# Patient Record
Sex: Male | Born: 1955 | Race: White | Hispanic: No | Smoking: Current every day smoker
Health system: Southern US, Community
[De-identification: ages and names within clinical notes are randomized; demographics above are authoritative.]

## PROBLEM LIST (undated history)

## (undated) DIAGNOSIS — I10 Essential (primary) hypertension: Secondary | ICD-10-CM

## (undated) DIAGNOSIS — F32A Depression, unspecified: Secondary | ICD-10-CM

## (undated) DIAGNOSIS — T18128A Food in esophagus causing other injury, initial encounter: Secondary | ICD-10-CM

## (undated) DIAGNOSIS — I2510 Atherosclerotic heart disease of native coronary artery without angina pectoris: Secondary | ICD-10-CM

## (undated) DIAGNOSIS — C801 Malignant (primary) neoplasm, unspecified: Secondary | ICD-10-CM

## (undated) DIAGNOSIS — I639 Cerebral infarction, unspecified: Secondary | ICD-10-CM

## (undated) DIAGNOSIS — F329 Major depressive disorder, single episode, unspecified: Secondary | ICD-10-CM

## (undated) DIAGNOSIS — I509 Heart failure, unspecified: Secondary | ICD-10-CM

## (undated) DIAGNOSIS — I219 Acute myocardial infarction, unspecified: Secondary | ICD-10-CM

## (undated) DIAGNOSIS — J449 Chronic obstructive pulmonary disease, unspecified: Secondary | ICD-10-CM

## (undated) DIAGNOSIS — G4730 Sleep apnea, unspecified: Secondary | ICD-10-CM

## (undated) HISTORY — PX: CARDIAC CATHETERIZATION: SHX172

## (undated) HISTORY — PX: ELBOW SURGERY: SHX618

## (undated) HISTORY — PX: COLONOSCOPY: SHX174

## (undated) HISTORY — PX: WRIST SURGERY: SHX841

---

## 2006-03-29 ENCOUNTER — Ambulatory Visit: Payer: Self-pay | Admitting: Internal Medicine

## 2008-01-14 ENCOUNTER — Observation Stay: Payer: Self-pay | Admitting: Internal Medicine

## 2008-01-14 ENCOUNTER — Other Ambulatory Visit: Payer: Self-pay

## 2008-09-08 ENCOUNTER — Observation Stay: Payer: Self-pay | Admitting: Internal Medicine

## 2008-09-12 ENCOUNTER — Inpatient Hospital Stay: Payer: Self-pay | Admitting: Internal Medicine

## 2010-09-13 ENCOUNTER — Inpatient Hospital Stay: Payer: Self-pay | Admitting: Internal Medicine

## 2010-09-20 ENCOUNTER — Inpatient Hospital Stay: Payer: Self-pay | Admitting: Internal Medicine

## 2011-05-21 ENCOUNTER — Inpatient Hospital Stay: Payer: Self-pay | Admitting: Internal Medicine

## 2011-05-21 LAB — CK TOTAL AND CKMB (NOT AT ARMC): CK-MB: 1.4 ng/mL (ref 0.5–3.6)

## 2011-05-21 LAB — COMPREHENSIVE METABOLIC PANEL
Albumin: 3.8 g/dL (ref 3.4–5.0)
Alkaline Phosphatase: 67 U/L (ref 50–136)
Anion Gap: 10 (ref 7–16)
BUN: 8 mg/dL (ref 7–18)
Calcium, Total: 8.8 mg/dL (ref 8.5–10.1)
Chloride: 106 mmol/L (ref 98–107)
Co2: 26 mmol/L (ref 21–32)
Creatinine: 0.89 mg/dL (ref 0.60–1.30)
SGOT(AST): 18 U/L (ref 15–37)
SGPT (ALT): 21 U/L
Sodium: 142 mmol/L (ref 136–145)

## 2011-05-21 LAB — CBC
HCT: 43.1 % (ref 40.0–52.0)
Platelet: 208 10*3/uL (ref 150–440)
RDW: 13.6 % (ref 11.5–14.5)

## 2011-05-22 LAB — CBC WITH DIFFERENTIAL/PLATELET
Eosinophil #: 0 10*3/uL (ref 0.0–0.7)
Eosinophil %: 0 %
Lymphocyte #: 0.6 10*3/uL — ABNORMAL LOW (ref 1.0–3.6)
MCH: 28.4 pg (ref 26.0–34.0)
MCHC: 33.6 g/dL (ref 32.0–36.0)
MCV: 85 fL (ref 80–100)
Monocyte #: 0.2 10*3/uL (ref 0.0–0.7)
Monocyte %: 4 %
Neutrophil %: 83.8 %
Platelet: 212 10*3/uL (ref 150–440)
RBC: 4.91 10*6/uL (ref 4.40–5.90)
RDW: 14.7 % — ABNORMAL HIGH (ref 11.5–14.5)

## 2011-05-22 LAB — BASIC METABOLIC PANEL
Anion Gap: 8 (ref 7–16)
BUN: 10 mg/dL (ref 7–18)
Calcium, Total: 8.8 mg/dL (ref 8.5–10.1)
Chloride: 106 mmol/L (ref 98–107)
EGFR (African American): >60
Osmolality: 284 (ref 275–301)
Potassium: 4.4 mmol/L (ref 3.5–5.1)

## 2011-05-27 LAB — CULTURE, BLOOD (SINGLE)

## 2012-07-08 ENCOUNTER — Emergency Department: Payer: Self-pay | Admitting: Unknown Physician Specialty

## 2012-07-08 LAB — URINALYSIS, COMPLETE
Blood: NEGATIVE
Nitrite: NEGATIVE
Specific Gravity: 1.031 (ref 1.003–1.030)
WBC UR: 4 /HPF (ref 0–5)

## 2012-07-08 LAB — COMPREHENSIVE METABOLIC PANEL
Alkaline Phosphatase: 93 U/L (ref 50–136)
Anion Gap: 7 (ref 7–16)
BUN: 15 mg/dL (ref 7–18)
Bilirubin,Total: 0.5 mg/dL (ref 0.2–1.0)
Calcium, Total: 9.4 mg/dL (ref 8.5–10.1)
Chloride: 105 mmol/L (ref 98–107)
Glucose: 113 mg/dL — ABNORMAL HIGH (ref 65–99)
Osmolality: 274 (ref 275–301)
Potassium: 3.5 mmol/L (ref 3.5–5.1)
SGOT(AST): 18 U/L (ref 15–37)
Sodium: 136 mmol/L (ref 136–145)

## 2012-07-08 LAB — CBC
HGB: 16.8 g/dL (ref 13.0–18.0)
MCH: 29.8 pg (ref 26.0–34.0)
MCHC: 35.1 g/dL (ref 32.0–36.0)
MCV: 85 fL (ref 80–100)
RBC: 5.63 10*6/uL (ref 4.40–5.90)
RDW: 14.2 % (ref 11.5–14.5)
WBC: 10.1 10*3/uL (ref 3.8–10.6)

## 2012-07-08 LAB — TROPONIN I: Troponin-I: <0.02 ng/mL

## 2012-07-08 LAB — LIPASE, BLOOD: Lipase: 105 U/L (ref 73–393)

## 2012-08-31 ENCOUNTER — Inpatient Hospital Stay: Payer: Self-pay | Admitting: Internal Medicine

## 2012-08-31 LAB — CBC
HCT: 40.8 % (ref 40.0–52.0)
HGB: 13.8 g/dL (ref 13.0–18.0)
MCH: 28 pg (ref 26.0–34.0)
MCHC: 33.8 g/dL (ref 32.0–36.0)
RBC: 4.93 10*6/uL (ref 4.40–5.90)
RDW: 13.7 % (ref 11.5–14.5)
WBC: 14.4 10*3/uL — ABNORMAL HIGH (ref 3.8–10.6)

## 2012-08-31 LAB — URINALYSIS, COMPLETE
Bacteria: NONE SEEN
Blood: NEGATIVE
Glucose,UR: 50 mg/dL (ref 0–75)
Nitrite: NEGATIVE
Ph: 6 (ref 4.5–8.0)
Protein: NEGATIVE
Specific Gravity: 1.018 (ref 1.003–1.030)
Squamous Epithelial: NONE SEEN

## 2012-08-31 LAB — BASIC METABOLIC PANEL
Chloride: 104 mmol/L (ref 98–107)
Creatinine: 1.03 mg/dL (ref 0.60–1.30)
EGFR (African American): >60
Glucose: 123 mg/dL — ABNORMAL HIGH (ref 65–99)
Osmolality: 276 (ref 275–301)

## 2012-09-01 LAB — BASIC METABOLIC PANEL
Anion Gap: 4 — ABNORMAL LOW (ref 7–16)
Calcium, Total: 9.4 mg/dL (ref 8.5–10.1)
Co2: 27 mmol/L (ref 21–32)
Glucose: 155 mg/dL — ABNORMAL HIGH (ref 65–99)
Osmolality: 280 (ref 275–301)
Potassium: 4.3 mmol/L (ref 3.5–5.1)
Sodium: 139 mmol/L (ref 136–145)

## 2012-09-01 LAB — CBC WITH DIFFERENTIAL/PLATELET
Basophil #: 0 10*3/uL (ref 0.0–0.1)
Eosinophil #: 0 10*3/uL (ref 0.0–0.7)
Eosinophil %: 0 %
HGB: 13.4 g/dL (ref 13.0–18.0)
Lymphocyte %: 4.8 %
MCH: 28.3 pg (ref 26.0–34.0)
MCHC: 34.4 g/dL (ref 32.0–36.0)
MCV: 82 fL (ref 80–100)
Monocyte #: 0.8 x10 3/mm (ref 0.2–1.0)
Neutrophil %: 89 %

## 2013-04-21 ENCOUNTER — Emergency Department: Payer: Self-pay | Admitting: Emergency Medicine

## 2013-06-01 ENCOUNTER — Inpatient Hospital Stay: Payer: Self-pay | Admitting: Internal Medicine

## 2013-06-01 LAB — CBC WITH DIFFERENTIAL/PLATELET
BASOS ABS: 0 10*3/uL (ref 0.0–0.1)
Basophil %: 0.7 %
EOS ABS: 0 10*3/uL (ref 0.0–0.7)
Eosinophil %: 0.3 %
HCT: 44.5 % (ref 40.0–52.0)
HGB: 15.1 g/dL (ref 13.0–18.0)
Lymphocyte #: 0.6 10*3/uL — ABNORMAL LOW (ref 1.0–3.6)
Lymphocyte %: 9.3 %
MCH: 28.9 pg (ref 26.0–34.0)
MCHC: 33.9 g/dL (ref 32.0–36.0)
MCV: 85 fL (ref 80–100)
MONOS PCT: 10.6 %
Monocyte #: 0.7 x10 3/mm (ref 0.2–1.0)
Neutrophil #: 5.3 10*3/uL (ref 1.4–6.5)
Neutrophil %: 79.1 %
PLATELETS: 147 10*3/uL — AB (ref 150–440)
RBC: 5.22 10*6/uL (ref 4.40–5.90)
RDW: 14.4 % (ref 11.5–14.5)
WBC: 6.7 10*3/uL (ref 3.8–10.6)

## 2013-06-01 LAB — COMPREHENSIVE METABOLIC PANEL
ALT: 18 U/L (ref 12–78)
ANION GAP: 3 — AB (ref 7–16)
AST: 32 U/L (ref 15–37)
Albumin: 3.6 g/dL (ref 3.4–5.0)
Alkaline Phosphatase: 101 U/L
BUN: 7 mg/dL (ref 7–18)
Bilirubin,Total: 0.3 mg/dL (ref 0.2–1.0)
CO2: 33 mmol/L — AB (ref 21–32)
Calcium, Total: 8.5 mg/dL (ref 8.5–10.1)
Chloride: 101 mmol/L (ref 98–107)
Creatinine: 0.89 mg/dL (ref 0.60–1.30)
EGFR (African American): >60
EGFR (Non-African Amer.): >60
GLUCOSE: 112 mg/dL — AB (ref 65–99)
Osmolality: 273 (ref 275–301)
Potassium: 3.3 mmol/L — ABNORMAL LOW (ref 3.5–5.1)
Sodium: 137 mmol/L (ref 136–145)
Total Protein: 8 g/dL (ref 6.4–8.2)

## 2013-06-01 LAB — URINALYSIS, COMPLETE
BLOOD: NEGATIVE
Bacteria: NONE SEEN
Bilirubin,UR: NEGATIVE
Glucose,UR: NEGATIVE mg/dL (ref 0–75)
Hyaline Cast: 30
Ketone: NEGATIVE
Leukocyte Esterase: NEGATIVE
Nitrite: NEGATIVE
PH: 5 (ref 4.5–8.0)
RBC,UR: 1 /HPF (ref 0–5)
SPECIFIC GRAVITY: 1.017 (ref 1.003–1.030)
Squamous Epithelial: NONE SEEN
WBC UR: 2 /HPF (ref 0–5)

## 2013-06-01 LAB — CK TOTAL AND CKMB (NOT AT ARMC)
CK, Total: 356 U/L — ABNORMAL HIGH
CK-MB: 2 ng/mL (ref 0.5–3.6)

## 2013-06-01 LAB — PRO B NATRIURETIC PEPTIDE: B-TYPE NATIURETIC PEPTID: 156 pg/mL — AB (ref 0–125)

## 2013-06-01 LAB — TROPONIN I: Troponin-I: <0.02 ng/mL

## 2013-06-01 LAB — PROTIME-INR
INR: 1
PROTHROMBIN TIME: 13.5 s (ref 11.5–14.7)

## 2013-06-02 LAB — BASIC METABOLIC PANEL
ANION GAP: 1 — AB (ref 7–16)
BUN: 12 mg/dL (ref 7–18)
CO2: 33 mmol/L — AB (ref 21–32)
CREATININE: 0.78 mg/dL (ref 0.60–1.30)
Calcium, Total: 9.1 mg/dL (ref 8.5–10.1)
Chloride: 103 mmol/L (ref 98–107)
EGFR (African American): >60
EGFR (Non-African Amer.): >60
GLUCOSE: 134 mg/dL — AB (ref 65–99)
Osmolality: 276 (ref 275–301)
Potassium: 4.4 mmol/L (ref 3.5–5.1)
SODIUM: 137 mmol/L (ref 136–145)

## 2013-06-06 LAB — CULTURE, BLOOD (SINGLE)

## 2014-08-02 NOTE — H&P (Signed)
PATIENT NAME:  Ruben [name], Ruben [name] MR#:  [id number] DATE OF BIRTH:  1955-07-18  DATE OF ADMISSION:  08/31/2012  REFERRING PHYSICIAN:  Dr. Marjean Donna.  PRIMARY CARE PHYSICIAN:  Dr. Nicky Pugh.   PRIMARY PULMONARY:  Dr. Devona Konig.   CHIEF COMPLAINT:  Shortness of breath and cough.   HISTORY OF PRESENT ILLNESS:  This is a 59 year old male with significant past medical history of chronic obstructive pulmonary disease, not on any home oxygen, who continues to smoke 1-1/2 packs per day, hypertension, depression, anxiety, comes to the hospital complaining of shortness of breath for the last two days, as well with cough, the patient reports he feels that he wants to cough, but he is congested.  He cannot cough out his sputum.  Occasionally it comes green in color, the patient is not using any home oxygen, was hypoxic in ED, requiring nasal cannula comes saturating 86% on room air, the patient's chest x-ray did not show any infiltrate, the patient was afebrile.  Denies any fever as well, the patient had significant wheezing in the ED, despite receiving multiple nebulizer treatments and IV Solu-Medrol, he continues to be hypoxic and still has diffuse wheezing, so hospitalist service were requested to admit the patient for COPD exacerbation, the patient was significantly tachycardic upon presentation in the 120s, but now much improved in the high 90s to 100s.   PAST MEDICAL HISTORY: 1.  Chronic obstructive pulmonary disease, not on home oxygen.  2.  Hypertension.  3.  Hyperlipidemia.  4.  Depression and anxiety.  5.  Tobacco abuse.   PAST SURGICAL HISTORY:  Left elbow and left wrist surgery after trauma.   ALLERGIES TO MEDICATIONS:  None.   HOME MEDICATIONS: 1.  Norvasc 10 mg daily.  2.  DuoNeb as needed.  3.  Lexapro 20 mg daily.  4.  Omeprazole 20 mg daily.  5.  Simvastatin 40 mg daily.  6.  Spiriva 18 mcg inhalation daily.  7.  Ventolin inhaler as needed.  8.  Trazodone 100 mg oral at  bedtime.   SOCIAL HISTORY:  Lives at home with his wife, has a 7-year-old daughter, continues to smoke 1-1/2 packs per day.  No alcohol or illicit drug use.   FAMILY HISTORY:  Mother had diabetes.  Father had chronic obstructive pulmonary disease.   REVIEW OF SYSTEMS:  CONSTITUTIONAL:  The patient denies fever, chills, fatigue, weakness.  EYES:  Denies blurry vision, double vision, pain, redness, inflammation or glaucoma.  EARS, NOSE, THROAT:  Denies tinnitus, ear pain, epistaxis or discharge.  RESPIRATORY:  Significant for cough, wheezing, chronic obstructive pulmonary disease.  No hemoptysis.  CARDIOVASCULAR:  Denies any chest pain.  No orthopnea, no edema.  No arrhythmia, no palpitations, no syncope.  GASTROINTESTINAL:  Denies any nausea, vomiting, diarrhea, abdominal pain, hematemesis, or melena.  GENITOURINARY:  Denies dysuria, hematuria, renal colic.  ENDOCRINE:  Denies polyuria, polydipsia, heat or cold intolerance.  HEMATOLOGY:  Denies anemia, easy bruising, bleeding diathesis.  INTEGUMENTARY:  Denies any acne, rash or skin lesions.  MUSCULOSKELETAL:  Denies neck, back, shoulder pain, arthritis or gout.  NEUROLOGIC:  Denies numbness, weakness, CVA, TIA or seizure.  PSYCHOLOGICAL:  No insomnia.  No schizophrenia.  No substance or alcohol abuse.   PHYSICAL EXAMINATION: VITAL SIGNS:  Temperature 98.1, pulse 125, respiratory rate 28, blood pressure 106/74, saturating 95% on 3 liters nasal cannula.  GENERAL:  Well-nourished male who looks comfortable in bed in no acute distress.  HEENT:  Head atraumatic, normocephalic.  Pupils  equal, reactive to light.  Pink conjunctivae.  Anicteric sclerae.  Moist oral mucosa.  No erythema or exudate.  NECK:  Supple.  No thyromegaly.  No JVD.  No carotid bruits.  No lymphadenopathy.  LUNGS:  The patient has diffuse scattered wheezing bilaterally with fair air entry, but no use of accessory muscles.  No rhonchi or rales could be heard.  CARDIOVASCULAR:   S1, S2 heard.  No rubs, murmurs, gallops.  ABDOMEN:  Soft, nontender, nondistended.  Bowel sounds present.  EXTREMITIES:  No edema.  No clubbing.  No cyanosis, +2 dorsalis pedis pulses felt bilaterally.  SKIN:  No acne, rash or skin lesions. LYMPHATIC:  No cervical or inguinal lymphadenopathy.  NEUROLOGIC:  Cranial nerves grossly intact.  No focal motor or sensory deficits.  PSYCHOLOGICAL:  The patient is awake, alert x 3.  Intact judgment and insight.   PERTINENT LABORATORY DATA:  Glucose 123, BUN 9, creatinine 1.03, sodium 138, potassium 3.3, chloride 104, CO2 27, anion gap 7.  White blood cells 14.4, hemoglobin 13.8, hematocrit 40.8, platelets 283.  ABG is showing pH of 7.44 with pCO2 of 36 and pO2 of 84 on nasal cannula.  Chest x-ray prelim reading does not show any acute opacity, still awaiting official reading.   ASSESSMENT AND PLAN: 1.  Acute chronic obstructive pulmonary disease exacerbation, the patient has no evidence of pneumonia on the chest x-ray, the patient was counseled about smoking cessation, will be started on IV Solu-Medrol 80 mg every 8 hours.  We will continue him on DuoNebs and Spiriva, we will keep him on O2 as needed to keep his O2 sat around 94, the patient might require O2 upon discharge if no improvement on saturation on room air, given the fact the patient has a cough with green-colored sputum, feels congested, he will be started on Mucinex and IV Levaquin.  2.  Hypokalemia, borderline.  We will give him 40 mEq by mouth potassium.  We will recheck level tomorrow.  3.  Hypertension.  Acceptable.  Continue with Norvasc.  4.  Depression and anxiety.  Continue with home medication Lexapro and trazodone.  5.  Tobacco abuse.  The patient was counseled 3 to 5 minutes about tobacco cessation and requesting a nicotine patch.  6.  Gastrointestinal prophylaxis, will be on Protonix.  7.  Deep vein thrombosis prophylaxis, on subQ heparin.  8.  CODE STATUS:  FULL CODE.   Total time  spent on admission and patient care 55 minutes.    ____________________________ Albertine Patricia, MD dse:ea D: 08/31/2012 04:58:58 ET T: 08/31/2012 06:02:11 ET JOB#: [id number]  cc: Albertine Patricia, MD, <Dictator>  Graciela Husbands MD ELECTRONICALLY SIGNED 09/02/2012 4:26

## 2014-08-02 NOTE — Discharge Summary (Signed)
PATIENT NAME:  Ruben [name], Ruben [name] MR#:  [id number] DATE OF BIRTH:  15-Aug-1955  DATE OF ADMISSION:  08/31/2012 DATE OF DISCHARGE:  09/01/2012  PRIMARY CARE PHYSICIAN: Dr. Brynda Greathouse.  DISCHARGE DIAGNOSES: Chronic obstructive pulmonary disease exacerbation, hypertension, tobacco abuse.   CODE STATUS: FULL CODE.   CONDITION: Stable.   HOME MEDICATIONS:  1.  Lexapro 20 mg p.o. daily.  2. Omeprazole 20 mg p.o. daily.  3.  Spiriva 18 mcg inhalation capsule, 1 inhaled once a day.  4.  DuoNebs.  5.  Norvasc 10 mg p.o. daily.  6.  Trazodone 100 mg p.o. at bedtime.  7.  Zocor 40 mg p.o. daily at bedtime.  8.  ProAir HFA, CFC-free, 90 mcg inhalation, 2 puffs 4 times a day, p.r.n.  9.  Clonazepam 2.5 mg p.o. tablets, 1 tablet b.i.d.  10. Prednisone 40 mg p.o. daily for 2 days, then taper.  11.  Nicotine patch 21 mg per 24 hour transdermal film, 1 patch transdermal once a day.   DIET: Low sodium, low fat, low cholesterol diet.   ACTIVITY: As tolerated.   FOLLOWUP CARE: Follow with PCP within 1 to 2 weeks.   REASON FOR ADMISSION: Shortness of breath and cough.   HOSPITAL COURSE: The patient is a 59 year old male with a history of COPD and not on home oxygen, continued to smoke 1 to half-pack a day, hypertension, depression, anxiety, came to the hospital due to shortness of breath and a cough. In the ED, the patient's O2 saturation was noted to be 86% on room air. In addition, the patient had significant wheezing despite multiple nebulizer treatments with IV Solu-Medrol,  so the patient was admitted for a COPD exacerbation. For a detailed history and physical examination please refer to the admission note dictated by Dr. Waldron Labs admission date.   The patient's laboratory data are as following: The patient's BUN 9, creatinine 1.03, glucose 123, sodium 138, potassium 3.3, chloride 104, bicarbonate 27.   WBC 14.4, hemoglobin 13.8.   ABG showed PH of 7.44, pCO2 of 36, PaO2 84.   After admission the  patient's chest x-ray did not show any infiltrates. After admission the patient was treated with Solu-Medrol and DuoNeb, Spiriva. In addition the patient was counseled for smoking cessation. The patient was off oxygen without desats. The patient's symptoms much-improved after the above-mentioned treatment.   For hypokalemia, the patient was given potassium 40 mEq once. Potassium, hypoglycemia, improved.   Hypertension, which is controlled by Norvasc.   Patient's symptoms have much-improved. His vital signs are stable. He is clinically stable. He was discharged to home yesterday. Discussed the patient's discharge plan with the patient and the case manager.   Time spent: About 32 minutes.    ____________________________ Demetrios Loll, MD qc:dm D: 09/02/2012 15:59:22 ET T: 09/03/2012 07:47:31 ET JOB#: [id number]  cc: Demetrios Loll, MD, <Dictator> Demetrios Loll MD ELECTRONICALLY SIGNED 09/04/2012 14:46

## 2014-08-03 NOTE — Discharge Summary (Signed)
PATIENT NAME:  Ruben [name], Ruben [name] MR#:  [id number] DATE OF BIRTH:  Feb 03, 1956  DATE OF ADMISSION:  06/01/2013 DATE OF DISCHARGE:  06/03/2013  DISCHARGE DIAGNOSES: 1.  Pneumonia, chronic obstructive pulmonary disease exacerbation.  2.  Anxiety and depression.   DISCHARGE MEDICATIONS:   1.  Omeprazole 20 mg p.o. daily.  2.  Spiriva 18 mcg inhalation daily.  3.  DuoNebs p.r.n.  4.  Amlodipine 10 mg p.o. daily.  5.  Trazodone 100 mg p.o. at bedtime.  6.  Simvastatin 40 mg p.o. daily.  7.  ProAir 90 mcg 2 puffs 4 times a day.  8.  Klonopin 0.5 mg p.o. b.i.d.  9.  Paxil 20 mg p.o. daily.  10.  Advair 250/50 one puff b.i.d.  11.  Levaquin 750 mg daily for 5 days.  12.  Prednisone 20 mg, 3 tablets daily for 2 days, 2 tablets daily for 2 days, 1 tablet daily for 2 days and then stop.   DIET: Low-sodium diet.   CONSULTATIONS: Psychiatry consult with Dr. Franchot Mimes.  HOSPITAL COURSE:  40.  A 59 year old male patient with ongoing tobacco abuse, came in because of shortness of breath and cough. The patient admitted for COPD exacerbation and pneumonia. The patient did receive IV antibiotics along with steroids. The patient does have trouble breathing and chest tightness when he came and O2 sats were 86% on room air. The patient received IV Solu-Medrol along with Levaquin and nebulizers. The patient's lungs are much clearer and the patient's O2 sats are 92% on room air at rest and also on exertion, did not qualify for home oxygen. H and P says the patient has oxygen at home but here the patient's room air sats on exertion were  92% and he did not qualify for setting up any oxygen. The patient was discharged home with prednisone, Levaquin and nebulizers. The patient advised to quit smoking. He continues to be on Advair.  2.  Anxiety and depression. The patient had severe anxiety here in the hospital, required extra doses of Klonopin. I requested Dr. Franchot Mimes to see the patient..  He started him on Paxil and I gave  a prescription for Paxil 20 mg daily. Today, he is much better. His lungs are much clearer and we have sent in the prescription to his pharmacy for Paxil 20 mg daily and he could continue Levaquin for 5 days as I said before.   LABORATORY DATA: Blood cultures are negative. Troponins are negative. The patient's electrolytes on admission stable except potassium was a little low WBC also is normal at 6.7. The patient's chest x-ray showed COPD with right lung base density consistent with atelectasis.   The patient initially was on BiPAP, then changed to nasal cannula. The patient remained hemodynamically stable during the hospital stay and discharged home in stable condition. The patient's primary care physician is Dr. Nicky Pugh.  He can follow with him.    TIME SPENT ON DISCHARGE PREPARATION: More than 30 minutes.   ____________________________ Epifanio Lesches, MD sk:cs D: 06/03/2013 13:34:52 ET T: 06/03/2013 20:03:02 ET JOB#: [id number]  cc: Epifanio Lesches, MD, <Dictator> Epifanio Lesches MD ELECTRONICALLY SIGNED 06/07/2013 14:03

## 2014-08-03 NOTE — Consult Note (Signed)
PATIENT NAME:  Ruben [name], Ruben [name] MR#:  [id number] DATE OF BIRTH:  Jul 21, 1955  AGE:  59 years  SEX:  Male  RACE:  White  DATE OF DICTATION: 06/02/2013  PLACE OF DICTATION: [place], Room #108-A, [place], [place]:  06/02/2013  CONSULTING PHYSICIAN:  Deah Ottaway K. Cimone Fahey, MD  SUBJECTIVE:  The patient was seen in consultation in room #108-A. The patient is a 59 year old white male, not employed, who is on Medicaid and Social Security for his COPD, which is severe. The patient is married for the second time for 10 years, and lives with his wife, who is 5 years old and works at [employer], along with their 65-year-old son. The patient comes to [facility] because of COPD and problems with the same. The patient was asked to be seen in consultation for anxiety and depression.   PAST PSYCHIATRIC HISTORY:  Inpatient hospital and psychiatry, [facility], 20 years ago for a 30-day program for his cocaine addiction and dependence. He has been sober from the same since he completed the program, and it cost him $28,000.   OBJECTIVE:  The patient is seen lying in bed, very distressed with breathing and using oxygen. The patient realizes that his COPD and distress is secondary to his longstanding cigarette smoking. Alert and oriented to place, person, and time. Fully aware of the situation. Affect is flat, mood restricted. Admits feeling hopeless and helpless. Admits feeling worthless and useless about his young one, who is only 59 years old. Denies any suicidal or homicidal plans, and reported "I have never felt suicidal or homicidal towards anybody." No psychosis. Denies auditory or visual hallucinations, except that he occasionally he sees bugs, and he realizes they are not there. Cognition is intact. General knowledge and information is fair. Insight and judgment guarded.  IMPRESSION:  Major depressive disorder, recurrent, with anxiety. Mood disorder secondary to physical problems,  such as chronic obstructive pulmonary disease and difficulty breathing secondary to the same.  RECOMMEND:  Start patient on Paxil 20 mg p.o. daily to help him with his mood and depression. I do not recommend benzodiazepines because they cause respiratory depression. The patient has enough insight and supportive therapy with coping skills will be given after patient is discharged from the hospital on an outpatient basis..    ____________________________ Wallace Cullens. Franchot Mimes, MD skc:mr D: 06/02/2013 20:13:15 ET T: 06/02/2013 21:30:13 ET JOB#: [id number]  cc: Arlyn Leak K. Franchot Mimes, MD, <Dictator> Dewain Penning MD ELECTRONICALLY SIGNED 06/03/2013 19:47

## 2014-08-03 NOTE — H&P (Signed)
PATIENT NAME:  Ruben [name], Ruben [name] MR#:  [id number] DATE OF BIRTH:  1955/12/23  DATE OF ADMISSION:  06/01/2013  PRIMARY CARE PHYSICIAN: Jaquelyn Bitter B. Brynda Greathouse, MD  REFERRING PHYSICIAN: [place] Owens Shark, MD  CHIEF COMPLAINT: Shortness of breath and chest tightness.   HISTORY OF PRESENT ILLNESS: The patient is a 59 year old Caucasian male with a past medical history of COPD, hypertension, hyperlipidemia, depression and tobacco dependence, who is brought into the ER via EMS for shortness of breath and chest tightness. The patient is reporting that he has been feeling short of breath for the past 3 days, and he started feeling chest tightness associated with worsening of the shortness of breath last night. As the patient was having trouble breathing, his neighbors have called EMS. By the time EMS went there, the patient was saturating at 86% on home O2. The patient lives on home oxygen. The patient was given 2 DuoNeb treatments and 2 albuterol treatments, and 125 mg of IV Solu-Medrol was given. The patient was subsequently placed on BiPAP, and he was brought into the ER. In the ER, the patient's BiPAP is continual. The patient has received another DuoNeb treatment in the ER. Chest x-ray has revealed COPD, but no infiltrates. The patient's initial troponin is negative, and ABG has revealed a pH of 7.35, pCO2 55 and pO2 446. Subsequently, FiO2 has dropped down to 28% from 100. During my examination, the patient is feeling better. Shortness of breath is improved, and chest tightness is significantly improved. No family members at bedside. As the patient was examined at around 4:20 a.m., he was tired, but arousable and answering questions appropriately. Denies any chest pain.   PAST MEDICAL HISTORY: Chronic history of COPD, lives on home oxygen, hypertension, hyperlipidemia, depression and nicotine dependence.   PAST SURGICAL HISTORY: Left elbow and left wrist surgery after trauma.   ALLERGIES: No known drug allergies.    PSYCHOSOCIAL HISTORY: Lives at home with wife and daughter. Continues to smoke 1 pack a day. Denies alcohol or illicit drug usage.   FAMILY HISTORY: Father had COPD. Mother has history of diabetes mellitus.   HOME MEDICATIONS:  1. Trazodone 100 mg p.o. once daily.  2. Spiriva 18 mcg 1 inhalation once daily.  3. Simvastatin 40 mg p.o. once daily. 4. ProAir 2 puff inhalations q.4 hours as needed.  5. Prednisone 20 mg in tapering dose. 6. Omeprazole 20 mg once daily. 7. Nicotine 21 mg transdermal patch to change once daily. 8. Lexapro 20 mg once daily. 9. Clonazepam 0.5 mg 2 times a day.  10. Amlodipine 10 mg once daily.   REVIEW OF SYSTEMS: CONSTITUTIONAL: Denies any fever, but complaining of weakness.  EYES: Denies blurry vision or double vision.  ENT: Denies epistaxis, discharge.  RESPIRATIONS: Complaining of cough. Has chronic history of COPD, lives on home oxygen.  CARDIOVASCULAR: No chest pain, but complaining of chest tightness. Denies syncope, palpitations.  GASTROINTESTINAL: Denies nausea, vomiting, diarrhea.  GENITOURINARY: No dysuria or hematuria.  ENDOCRINE: Denies polyuria, nocturia, thyroid problems.  HEMATOLOGIC AND LYMPHATIC: No anemia, easy bruising or bleeding.  INTEGUMENTARY: No acne, rash, lesions.  MUSCULOSKELETAL: No joint pain in the neck and back. Denies gout.  NEUROLOGIC: Denies vertigo, ataxia. No seizures.  PSYCHIATRIC: Has history of depression. No ADD, OCD.   PHYSICAL EXAMINATION:  VITAL SIGNS: Temperature 98.2, pulse 112, respirations 26, blood pressure 122/60, pulse oximetry 97% on BiPAP.  GENERAL APPEARANCE: Not under acute distress. Moderately built and nourished.  HEENT: Normocephalic, atraumatic. Pupils are equally reacting  to light and accommodation. No scleral icterus. No conjunctival injection. No sinus tenderness. No postnasal drip. The patient is on BiPAP mask.  NECK: Supple. No JVD. No thyromegaly. Range of motion is intact.  LUNGS:  Moderate air entry with diffuse wheezing. No crackles. No anterior chest wall tenderness on palpation.  CARDIAC: S1, S2 normal. Regular rate and rhythm. No murmurs.  GASTROINTESTINAL: Soft. Bowel sounds are positive in all 4 quadrants. Nontender, nondistended. No hepatosplenomegaly. No masses felt. Obese.  NEUROLOGIC: Awake and alert. The patient is lethargic, but arousable and answering questions appropriately. Motor and sensory are grossly intact. Reflexes are 2+.  EXTREMITIES: No edema. No cyanosis. No clubbing.  SKIN: Warm to touch. Normal turgor. No rashes. No lesions.  MUSCULOSKELETAL: No joint effusion, tenderness, erythema.  LABORATORY AND IMAGING STUDIES: A 12-lead EKG: Sinus tachycardia at 118 beats per minute. Left posterior fascicular block is noted. No acute ST-T wave changes. PT is 13.5, INR 1.0. ABG on 100% FiO2: pH is 7.35, pCO2 55, pO2 of 446, base excess 3.4, bicarbonate is 30.4, pulse oximetry 99.5%. CBC is normal except platelet count at 147. LFTs are normal. CHEM-8: BNP 156, glucose 112, BUN 7, creatinine 0.89, sodium 137, potassium 3.3, chloride 101, CO2 33, GFR greater than 60, anion gap 3, serum osmolality 273, calcium 7.5.   ASSESSMENT AND PLAN: A 59 year old Caucasian male brought into the ER with a 3-day history of shortness of breath and chest tightness. Will be admitted with the following assessment and plan:   1. Acute hypoxic respiratory failure from acute exacerbation of chronic obstructive pulmonary disease. The patient is placed on BiPAP machine. Will repeat ABG in a.m. Will provide him Solu-Medrol 60 mg intravenous q.6 hours.  DuoNeb nebulizer treatments q.6 hours and will provide albuterol nebulizer treatments as-needed basis.  2. Acute bronchitis with bronchoconstriction. The patient will be on IV levofloxacin. Provide him nebulizer treatments and IV steroids.  3. Nicotine dependence. The patient is on nicotine patch. Will continue that. The patient needs  counseling in a.m. when he is more awake and alert.  4. Hypertension. Blood pressure medication, amlodipine, will be continued.  5. Depression. Continue Lexapro. Will continue trazodone as well. 6. Will provide him gastrointestinal and deep vein thrombosis prophylaxis.   CODE STATUS: He is full code.   Diagnosis and plan of care were discussed in detail with the patient. He is aware of the plan.   TOTAL TIME SPENT ON ADMISSION: 45 minutes.    ____________________________ Nicholes Mango, MD ag:lb D: 06/01/2013 05:12:40 ET T: 06/01/2013 05:54:57 ET JOB#: [id number]  cc: Nicholes Mango, MD, <Dictator> Mikeal Hawthorne. Brynda Greathouse, MD Nicholes Mango MD ELECTRONICALLY SIGNED 06/07/2013 19:22

## 2014-08-04 NOTE — Discharge Summary (Signed)
PATIENT NAME:  Ruben [name], Ruben [name] MR#:  [id number] DATE OF BIRTH:  1955-05-27  DATE OF ADMISSION:  05/21/2011 DATE OF DISCHARGE:  05/22/2011   The patient signed out [place] on 05/22/2011 in the morning before I saw the patient.   PRIMARY PULMONARY DOCTOR: Dr. Devona Konig   PRIMARY MD: Dr. Nicky Pugh   HISTORY OF PRESENT ILLNESS: This is a 59 year old with history of chronic obstructive pulmonary disease not on oxygen with ongoing tobacco abuse, hypertension, depression, and anxiety who was admitted for chronic cough and trouble breathing. The patient had wheezing on examination when he came in and the patient was admitted for chronic obstructive pulmonary disease exacerbation and started on Solu-Medrol, DuoNeb spray with Symbicort, and also oxygen 2 liters. The patient's potassium was also low with potassium of 3.4 on admission which was replaced.   The patient's lab data yesterday showed WBC 5.4, hemoglobin 14, hematocrit 41.6, platelets 212. Electrolytes sodium 142, potassium 4.4, chloride 106, bicarb 28, BUN 10, creatinine 0.72, glucose 137. Chest x-ray on admission showed lung fields clear and COPD changes. The patient's troponins have been negative. Blood culture did not show any growth.   The patient signed out [place].    TIME SPENT ON PREPARATION: Less than 30 minutes.   ____________________________ Epifanio Lesches, MD sk:drc D: 05/23/2011 08:08:59 ET T: 05/23/2011 11:59:49 ET JOB#: [id number]  cc: Epifanio Lesches, MD, <Dictator>, Mikeal Hawthorne. Brynda Greathouse, MD Epifanio Lesches MD ELECTRONICALLY SIGNED 05/31/2011 16:21

## 2014-08-04 NOTE — H&P (Signed)
PATIENT NAME:  Ruben [name], Ruben [name] MR#:  [id number] DATE OF BIRTH:  [birth year]  DATE OF ADMISSION:  05/21/2011  ADMITTING:  Gladstone Lighter, MD PRIMARY MD:  Nicky Pugh, MD PRIMARY PULMONOLOGIST: Dr. Devona Konig.    CHIEF COMPLAINT:  Difficulty breathing.   HISTORY OF PRESENT ILLNESS: Ruben [name] is a 59 year old male with past medical history significant for chronic obstructive pulmonary disease, not on any home oxygen, ongoing smoking about 2 packs per day, hypertension, depression, and anxiety, comes to the hospital from home complaining of difficulty breathing that started yesterday. The patient states he has chronic cough from his chronic obstructive pulmonary disease but the cough has gotten worse over the last couple of days but since yesterday and this morning he was having chest tightness, was not able to breathe, and some pleuritic chest pain so came to the emergency room. He was extensively wheezing on examination, and his breathing has improved with two rounds of nebulizers.  He is still tachycardic and has significant wheezing so he is being admitted for chronic obstructive pulmonary disease exacerbation. Patient also mentioned that he tried nebulizers at home and ran out of his medicine in the nebulizers so came here.    PAST MEDICAL HISTORY:  1. Chronic obstructive pulmonary disease, not on home oxygen.  2. Hypertension.  3. Hyperlipidemia.  4. Depression and anxiety.  5. Ongoing smoking of 2 packs per day.   PAST SURGICAL HISTORY: Left elbow and left wrist surgery after trauma.   ALLERGIES TO MEDICATIONS:  None.   HOME MEDICATIONS:  1. Norvasc 10 mg p.o. daily.  2. DuoNeb as needed.  3. Lexapro 20 mg p.o. daily.  4. Omeprazole 20 mg p.o. daily.  5. Simvastatin 40 mg p.o. daily.  6. Spiriva 18 mcg inhalation capsule daily.  7. Ventolin inhaler as needed.  8. Trazodone 100 mg p.o. at bedtime.   SOCIAL HISTORY: Lives at home with wife and also a 103-year-old daughter.  Continues to smoke about 2 packs per day. No alcohol use.   FAMILY HISTORY: Mother had diabetes. She passed away.  Father with emphysema.  No history of any cancer running in the family.   REVIEW OF SYSTEMS. CONSTITUTIONAL: No fever, fatigue, or weakness. EYES: No blurred vision, double vision, pain, redness, inflammation, or glaucoma. ENT: No tinnitus, ear pain, epistaxis or discharge. RESPIRATORY: Positive for cough, wheeze, chronic obstructive pulmonary disease. No hemoptysis. CARDIOVASCULAR: Positive for chest pain.  No orthopnea, edema, arrhythmia, palpitations, or syncope. GI: No nausea, vomiting, diarrhea, abdominal pain, hematemesis, or melena. GENITOURINARY: No dysuria, hematuria, renal calculus, or frequency. ENDOCRINE: No polyuria, nocturia, thyroid problems, heat or cold intolerance. HEMATOLOGY: No anemia, easy bruising or bleeding. SKIN: No acne, rash, or lesions. MUSCULOSKELETAL: No neck, back, shoulder pain, arthritis, or gout. NEUROLOGIC: No numbness, weakness, cerebrovascular accident, transient ischemic attack, or seizures. PSYCHOLOGICAL: No anxiety, insomnia, or depression.   PHYSICAL EXAMINATION:  VITAL SIGNS: Temperature 97.4 degrees Fahrenheit, pulse 107, respirations 24, blood pressure 123/62, pulse oximetry 97% on 2 liters oxygen.    GENERAL: Well-built, well-nourished male sitting in bed, not in any acute distress.   HEENT: Normocephalic, atraumatic. Pupils equal, round, reacting to light. Anicteric sclerae. Extraocular movements intact. Oropharynx clear without erythema, mass or exudates.   NECK: Supple. No thyromegaly, JVD, or carotid bruits.  No lymphadenopathy.   LUNGS: He has diffuse scattered wheezing bilaterally. No use of accessory muscles for breathing. No rhonchi or rales heard.     CARDIOVASCULAR: S1, S2 regular rate and rhythm. No  murmurs, rubs, or gallops.   ABDOMEN: Soft, nontender, nondistended. No hepatosplenomegaly.  Normal bowel sounds.   EXTREMITIES:  No pedal edema. No clubbing or cyanosis.  2+ dorsalis pedis pulses palpable bilaterally.   SKIN: No acne, rash, or lesions.    LYMPHATIC: No cervical or inguinal lymphadenopathy.    NEUROLOGIC:  Cranial nerves intact.  No focal motor or sensory deficits.   PSYCHOLOGICAL: The patient is awake, alert, oriented x3.    LABORATORY DATA:  WBC 7.1, hemoglobin 14.6, hematocrit 42.9, platelet count 208,000.  Sodium 142, potassium 3.4, chloride 106, bicarbonate 26, BUN 8, creatinine 0.89, glucose of 122, calcium of 8.8.  ALT 21, AST 18, alkaline phosphatase 67. albumin 3.8. Troponin less than 0.02. CK 83, CK-MB 1.4. Chest x-ray showing clear lung fields and chronic obstructive pulmonary disease changes. EKG showing sinus tachycardia, heart rate of 109, no ST-T wave abnormalities.   ASSESSMENT AND PLAN: This is a 59 year old man with history of chronic obstructive pulmonary disease and ongoing smoking, not on any home oxygen, comes with chronic obstructive pulmonary disease exacerbation with diffuse wheezing.   1. Acute on chronic obstructive pulmonary disease exacerbation without evidence of pneumonia or bronchitis. Advised smoking cessation. Solu-Medrol 60 mg IV q.8 hours for now. Continue DuoNeb and also Spiriva, Symbicort. His saturations are well maintained without the O2 but we will provide 2 liters of oxygen support at this time and wean off if improving in a.m..  I do not see any need for starting antibiotics at this time.  2. Hypokalemia. Replace with 40 mEq, likely intracellular influx caused by albuterol.    3. Hypertension.  On Norvasc, continue that.  4. Depression and anxiety. Continue his home medications. He is on the Lexapro and trazodone.  5. Tobacco use disorder. He has been counseled for three minutes against smoking.  He is not ready to quit but says he will try patches so we placed him on nicotine patch and Nicotrol inhaler while in the hospital.   6. Gastrointestinal prophylaxis and  deep vein thrombosis prophylaxis. On Protonix and also Lovenox.    CODE STATUS: Full code.   TIME SPENT ON ADMISSION: 50 minutes.    ____________________________ Gladstone Lighter, MD rk:vtd D: 05/21/2011 17:15:33 ET T: 05/21/2011 18:50:30 ET JOB#: [id number]  cc: Gladstone Lighter, MD, <Dictator> Mikeal Hawthorne. Brynda Greathouse, MD Allyne Gee, MD Gladstone Lighter MD ELECTRONICALLY SIGNED 06/04/2011 15:52

## 2014-09-25 ENCOUNTER — Emergency Department: Payer: Medicaid Other

## 2014-09-25 ENCOUNTER — Inpatient Hospital Stay
Admission: EM | Admit: 2014-09-25 | Discharge: 2014-09-26 | DRG: 191 | Disposition: A | Discharge status: Home / Self Care | Payer: Medicaid Other | Attending: Internal Medicine | Admitting: Internal Medicine

## 2014-09-25 ENCOUNTER — Encounter: Payer: Self-pay | Admitting: Emergency Medicine

## 2014-09-25 DIAGNOSIS — F332 Major depressive disorder, recurrent severe without psychotic features: Secondary | ICD-10-CM

## 2014-09-25 DIAGNOSIS — Z9889 Other specified postprocedural states: Secondary | ICD-10-CM | POA: Diagnosis not present

## 2014-09-25 DIAGNOSIS — Z66 Do not resuscitate: Secondary | ICD-10-CM | POA: Diagnosis present

## 2014-09-25 DIAGNOSIS — F17210 Nicotine dependence, cigarettes, uncomplicated: Secondary | ICD-10-CM | POA: Diagnosis present

## 2014-09-25 DIAGNOSIS — Z8249 Family history of ischemic heart disease and other diseases of the circulatory system: Secondary | ICD-10-CM

## 2014-09-25 DIAGNOSIS — J441 Chronic obstructive pulmonary disease with (acute) exacerbation: Secondary | ICD-10-CM | POA: Diagnosis present

## 2014-09-25 DIAGNOSIS — Z833 Family history of diabetes mellitus: Secondary | ICD-10-CM | POA: Diagnosis not present

## 2014-09-25 DIAGNOSIS — F329 Major depressive disorder, single episode, unspecified: Secondary | ICD-10-CM | POA: Diagnosis present

## 2014-09-25 DIAGNOSIS — F32A Depression, unspecified: Secondary | ICD-10-CM | POA: Diagnosis present

## 2014-09-25 HISTORY — DX: Chronic obstructive pulmonary disease, unspecified: J44.9

## 2014-09-25 HISTORY — DX: Depression, unspecified: F32.A

## 2014-09-25 HISTORY — DX: Major depressive disorder, single episode, unspecified: F32.9

## 2014-09-25 LAB — BLOOD GAS, ARTERIAL
ACID-BASE EXCESS: 3 mmol/L (ref 0.0–3.0)
Allens test (pass/fail): POSITIVE — AB
BICARBONATE: 27.1 meq/L (ref 21.0–28.0)
O2 Saturation: 93.6 %
PH ART: 7.45 (ref 7.350–7.450)
PO2 ART: 66 mmHg — AB (ref 83.0–108.0)
Patient temperature: 37
pCO2 arterial: 39 mmHg (ref 32.0–48.0)

## 2014-09-25 LAB — CBC
HCT: 41.9 % (ref 40.0–52.0)
HEMATOCRIT: 41.6 % (ref 40.0–52.0)
HEMOGLOBIN: 14 g/dL (ref 13.0–18.0)
Hemoglobin: 14.1 g/dL (ref 13.0–18.0)
MCH: 28.1 pg (ref 26.0–34.0)
MCH: 28.3 pg (ref 26.0–34.0)
MCHC: 33.5 g/dL (ref 32.0–36.0)
MCHC: 33.8 g/dL (ref 32.0–36.0)
MCV: 83.7 fL (ref 80.0–100.0)
MCV: 83.9 fL (ref 80.0–100.0)
Platelets: 304 10*3/uL (ref 150–440)
Platelets: 328 10*3/uL (ref 150–440)
RBC: 4.97 MIL/uL (ref 4.40–5.90)
RBC: 4.99 MIL/uL (ref 4.40–5.90)
RDW: 14.3 % (ref 11.5–14.5)
RDW: 14.6 % — ABNORMAL HIGH (ref 11.5–14.5)
WBC: 10.2 10*3/uL (ref 3.8–10.6)
WBC: 9.2 10*3/uL (ref 3.8–10.6)

## 2014-09-25 LAB — BASIC METABOLIC PANEL
ANION GAP: 10 (ref 5–15)
Anion gap: 10 (ref 5–15)
BUN: 10 mg/dL (ref 6–20)
BUN: 13 mg/dL (ref 6–20)
CALCIUM: 9.1 mg/dL (ref 8.9–10.3)
CO2: 25 mmol/L (ref 22–32)
CO2: 27 mmol/L (ref 22–32)
CREATININE: 0.85 mg/dL (ref 0.61–1.24)
CREATININE: 0.85 mg/dL (ref 0.61–1.24)
Calcium: 9.3 mg/dL (ref 8.9–10.3)
Chloride: 102 mmol/L (ref 101–111)
Chloride: 102 mmol/L (ref 101–111)
GFR calc Af Amer: >60 mL/min (ref >60)
GFR calc non Af Amer: >60 mL/min (ref >60)
Glucose, Bld: 114 mg/dL — ABNORMAL HIGH (ref 65–99)
Glucose, Bld: 151 mg/dL — ABNORMAL HIGH (ref 65–99)
POTASSIUM: 3.9 mmol/L (ref 3.5–5.1)
Potassium: 3.3 mmol/L — ABNORMAL LOW (ref 3.5–5.1)
Sodium: 137 mmol/L (ref 135–145)
Sodium: 139 mmol/L (ref 135–145)

## 2014-09-25 LAB — BRAIN NATRIURETIC PEPTIDE: B Natriuretic Peptide: 41 pg/mL (ref 0.0–100.0)

## 2014-09-25 LAB — TROPONIN I: Troponin I: <0.03 ng/mL (ref <0.031)

## 2014-09-25 MED ORDER — SODIUM CHLORIDE 0.9 % IJ SOLN
3.0000 mL | Freq: Two times a day (BID) | INTRAMUSCULAR | Status: DC
  Administered 2014-09-25 – 2014-09-26 (×3): 3 mL via INTRAVENOUS

## 2014-09-25 MED ORDER — DEXTROSE 5 % IV SOLN
500.0000 mg | INTRAVENOUS | Status: DC
  Administered 2014-09-25 – 2014-09-26 (×2): 500 mg via INTRAVENOUS
  Filled 2014-09-25 (×3): qty 500

## 2014-09-25 MED ORDER — MOMETASONE FURO-FORMOTEROL FUM 100-5 MCG/ACT IN AERO
2.0000 | INHALATION_SPRAY | Freq: Two times a day (BID) | RESPIRATORY_TRACT | Status: DC
  Administered 2014-09-25 – 2014-09-26 (×3): 2 via RESPIRATORY_TRACT
  Filled 2014-09-25: qty 8.8

## 2014-09-25 MED ORDER — ENOXAPARIN SODIUM 40 MG/0.4ML ~~LOC~~ SOLN
40.0000 mg | SUBCUTANEOUS | Status: DC
  Administered 2014-09-25 – 2014-09-26 (×2): 40 mg via SUBCUTANEOUS
  Filled 2014-09-25 (×2): qty 0.4

## 2014-09-25 MED ORDER — IPRATROPIUM-ALBUTEROL 0.5-2.5 (3) MG/3ML IN SOLN
3.0000 mL | Freq: Four times a day (QID) | RESPIRATORY_TRACT | Status: DC
  Administered 2014-09-25 – 2014-09-26 (×3): 3 mL via RESPIRATORY_TRACT
  Filled 2014-09-25 (×3): qty 3

## 2014-09-25 MED ORDER — NICOTINE 21 MG/24HR TD PT24
21.0000 mg | MEDICATED_PATCH | Freq: Every day | TRANSDERMAL | Status: DC
  Administered 2014-09-25 – 2014-09-26 (×2): 21 mg via TRANSDERMAL
  Filled 2014-09-25 (×2): qty 1

## 2014-09-25 MED ORDER — ONDANSETRON HCL 4 MG PO TABS: 4.0000 mg | ORAL_TABLET | Freq: Four times a day (QID) | ORAL | Status: DC | PRN

## 2014-09-25 MED ORDER — IPRATROPIUM-ALBUTEROL 0.5-2.5 (3) MG/3ML IN SOLN
3.0000 mL | Freq: Once | RESPIRATORY_TRACT | Status: AC
  Administered 2014-09-25: 3 mL via RESPIRATORY_TRACT

## 2014-09-25 MED ORDER — SENNOSIDES-DOCUSATE SODIUM 8.6-50 MG PO TABS: 1.0000 | ORAL_TABLET | Freq: Every evening | ORAL | Status: DC | PRN

## 2014-09-25 MED ORDER — IPRATROPIUM-ALBUTEROL 0.5-2.5 (3) MG/3ML IN SOLN
3.0000 mL | RESPIRATORY_TRACT | Status: DC
  Administered 2014-09-25 (×2): 3 mL via RESPIRATORY_TRACT
  Filled 2014-09-25 (×2): qty 3

## 2014-09-25 MED ORDER — MIRTAZAPINE 30 MG PO TABS
30.0000 mg | ORAL_TABLET | Freq: Every day | ORAL | Status: DC
  Administered 2014-09-25: 30 mg via ORAL
  Filled 2014-09-25: qty 1

## 2014-09-25 MED ORDER — IPRATROPIUM-ALBUTEROL 0.5-2.5 (3) MG/3ML IN SOLN
RESPIRATORY_TRACT | Status: AC
  Administered 2014-09-25: 3 mL via RESPIRATORY_TRACT
  Filled 2014-09-25: qty 6

## 2014-09-25 MED ORDER — METHYLPREDNISOLONE SODIUM SUCC 125 MG IJ SOLR
60.0000 mg | Freq: Four times a day (QID) | INTRAMUSCULAR | Status: DC
  Administered 2014-09-25 – 2014-09-26 (×6): 60 mg via INTRAVENOUS
  Filled 2014-09-25 (×6): qty 2

## 2014-09-25 MED ORDER — ZOLPIDEM TARTRATE 5 MG PO TABS
5.0000 mg | ORAL_TABLET | Freq: Every evening | ORAL | Status: DC | PRN
  Administered 2014-09-25: 5 mg via ORAL
  Filled 2014-09-25: qty 1

## 2014-09-25 MED ORDER — ACETAMINOPHEN 325 MG PO TABS
650.0000 mg | ORAL_TABLET | Freq: Four times a day (QID) | ORAL | Status: DC | PRN
  Administered 2014-09-25: 650 mg via ORAL
  Filled 2014-09-25: qty 2

## 2014-09-25 MED ORDER — ACETAMINOPHEN 650 MG RE SUPP: 650.0000 mg | Freq: Four times a day (QID) | RECTAL | Status: DC | PRN

## 2014-09-25 MED ORDER — ONDANSETRON HCL 4 MG/2ML IJ SOLN: 4.0000 mg | Freq: Four times a day (QID) | INTRAMUSCULAR | Status: DC | PRN

## 2014-09-25 NOTE — ED Notes (Signed)
Patient tearful - advised MD that he thinks that we are killing him because he is not on oxygen. Of note, saturations 97% on RA without appreciable increased WOB noted. Owens Shark, MD with [facility] to place patient on supplemental O2 at 2L/[place]. Order to be entered and carried by this RN.

## 2014-09-25 NOTE — H&P (Signed)
[facility] [place] NAME: Ruben [name]    MR#:  [id number]  DATE OF BIRTH:  May 21, 1955  DATE OF ADMISSION:  09/25/2014  PRIMARY CARE PHYSICIAN: Marden Noble, MD   REQUESTING/REFERRING PHYSICIAN: Owens Shark  CHIEF COMPLAINT:   Chief Complaint  Patient presents with  . Shortness of Breath    HISTORY OF PRESENT ILLNESS:  Ruben [name]  is a 59 y.o. male who presents with progressive shortness of breath over the last 3 days. Patient has known COPD, and also presents ED very depressed today. Patient denies other past medical history. States that his only medications are an albuterol rescue inhaler and nebulizer. He states that over the last 3 days he's gotten progressively more short of breath, that he is use his nebulizer and inhaler, but that his shortness of breath has progressed despite that. Denies significant cough or sputum production. Denies fevers chills or other infectious symptoms. He also states that he is depressed, and has become significantly more so in the recent past due to how limited he feels in his activities due to his COPD. He denies active suicidal intentions at this time, though he does respond to [place] question stating that he wishes to be DO NOT RESUSCITATE. It is unclear how often he has to answer this question this time, however his wife, who isn't the ED with him, states that this answer is not inconsistent with what he is answered before when he has been in a more normal emotional state. Hospitalists were called for admission for COPD exacerbation.  PAST MEDICAL HISTORY:   Past Medical History  Diagnosis Date  . COPD (chronic obstructive pulmonary disease)   . Depression     PAST SURGICAL HISTORY:   Past Surgical History  Procedure Laterality Date  . Elbow surgery Left   . Wrist surgery Left     SOCIAL HISTORY:   History  Substance Use Topics  . Smoking status: Current Every Day Smoker -- 1.50  packs/day for 43 years    Types: Cigarettes  . Smokeless tobacco: Not on file  . Alcohol Use: No    FAMILY HISTORY:   Family History  Problem Relation Age of Onset  . Heart disease    . Hypertension    . Diabetes      DRUG ALLERGIES:  No Known Allergies  MEDICATIONS AT HOME:   Prior to Admission medications   Not on File    REVIEW OF SYSTEMS:  Review of Systems  Constitutional: Negative for fever, chills, weight loss and malaise/fatigue.  HENT: Negative for ear pain, hearing loss and tinnitus.   Eyes: Negative for blurred vision, double vision, pain and redness.  Respiratory: Positive for shortness of breath and wheezing. Negative for cough, hemoptysis and sputum production.   Cardiovascular: Negative for chest pain, palpitations, orthopnea and leg swelling.  Gastrointestinal: Negative for nausea, vomiting, abdominal pain, diarrhea and constipation.  Genitourinary: Negative for dysuria, frequency and hematuria.  Musculoskeletal: Negative for back pain, joint pain and neck pain.  Skin:       No acne, rash, or lesions  Neurological: Negative for dizziness, tremors, focal weakness and weakness.  Endo/Heme/Allergies: Negative for polydipsia. Does not bruise/bleed easily.  Psychiatric/Behavioral: Positive for depression. Negative for suicidal ideas. The patient is not nervous/anxious and does not have insomnia.      VITAL SIGNS:   Filed Vitals:   09/25/14 0130 09/25/14 0200 09/25/14 0300 09/25/14 0330  BP: 153/78 137/77  153/82 155/99  Pulse: 94 74 89 107  Temp:      TempSrc:      Resp: '13 15 14 25  '$ Height:      Weight:      SpO2: 88% 93% 96% 95%   Wt Readings from Last 3 Encounters:  09/25/14 77.111 kg (170 lb)    PHYSICAL EXAMINATION:  Physical Exam  Constitutional: He is oriented to person, place, and time. He appears well-developed and well-nourished. No distress.  HENT:  Head: Normocephalic and atraumatic.  Mouth/Throat: Oropharynx is clear and moist.   Eyes: Conjunctivae and EOM are normal. Pupils are equal, round, and reactive to light. No scleral icterus.  Neck: Normal range of motion. Neck supple. No JVD present. No thyromegaly present.  Cardiovascular: Normal rate, regular rhythm and intact distal pulses.  Exam reveals no gallop and no friction rub.   No murmur heard. Respiratory: He is in respiratory distress. He has wheezes. He has no rales.  Moderate air movement, more diminished in the bases, significant diffuse bilateral expiratory wheezing, no rhonchi  GI: Soft. Bowel sounds are normal. He exhibits no distension. There is no tenderness.  Musculoskeletal: Normal range of motion. He exhibits no edema.  No arthritis, no gout  Lymphadenopathy:    He has no cervical adenopathy.  Neurological: He is alert and oriented to person, place, and time. No cranial nerve deficit.  No dysarthria, no aphasia  Skin: Skin is warm and dry. No rash noted. No erythema.  Psychiatric:  Significantly emotionally unstable, very tearful during the interview and exam, severely depressed affect. Not agitated, he is cooperative, denies active suicidal intent.    LABORATORY PANEL:   CBC  Recent Labs Lab 09/25/14 0021  WBC 9.2  HGB 14.1  HCT 41.6  PLT 304   ------------------------------------------------------------------------------------------------------------------  Chemistries   Recent Labs Lab 09/25/14 0021  NA 139  K 3.3*  CL 102  CO2 27  GLUCOSE 114*  BUN 10  CREATININE 0.85  CALCIUM 9.1   ------------------------------------------------------------------------------------------------------------------  Cardiac Enzymes  Recent Labs Lab 09/25/14 0021  TROPONINI <0.03   ------------------------------------------------------------------------------------------------------------------  RADIOLOGY:  Dg Chest Port 1 View  09/25/2014   CLINICAL DATA:  Acute onset of shortness of breath. Initial encounter.  EXAM: PORTABLE  CHEST - 1 VIEW  COMPARISON:  Chest radiograph performed 06/01/2013  FINDINGS: The lungs are well-aerated. Minimal bilateral atelectasis hit noted. There is no evidence of pleural effusion or pneumothorax.  The cardiomediastinal silhouette is within normal limits. No acute osseous abnormalities are seen.  IMPRESSION: Minimal bilateral atelectasis noted; lungs otherwise clear.   Electronically Signed   By: Garald Balding M.D.   On: 09/25/2014 01:04    EKG:   Orders placed or performed during the hospital encounter of 09/25/14  . ED EKG (<44mns upon arrival to the ED)  . ED EKG (<166ms upon arrival to the ED)    IMPRESSION AND PLAN:  Principal Problem:   COPD with exacerbation - Solu-Medrol, nebs, azithromycin, start Dulera as patient was on Advair in the past, and it is unclear why this was stopped. Patient likely needs pulmonary evaluation upon discharge, and the long-acting beta agonist inhaler plus or minus steroid inhaler Active Problems:   Depression - patient is extremely tearful, we'll get a psychiatry consult while inpatient for significant and limiting depression, patient denies active suicidal intentions at this time.  All the records are reviewed and case discussed with ED provider. Management plans discussed with the patient and/or family.  DVT  PROPHYLAXIS: SubQ lovenox  ADMISSION STATUS: Inpatient  CODE STATUS: DNR, though this should be clarified once he has been evaluated by psychiatry for his depression.  TOTAL TIME TAKING CARE OF THIS PATIENT: 50 minutes.    Mariyah Upshaw FIELDING 09/25/2014, 3:55 AM  [facility]  Office  [phone]  CC: Primary care physician; Marden Noble, MD

## 2014-09-25 NOTE — ED Notes (Signed)
Patient brought in by ems from home with shortness of breath for 2-3 days. Patient given 1 duoneb, 2 albuterol treatments and 125 mg solumedrol by ems.

## 2014-09-25 NOTE — Progress Notes (Signed)
[facility] at [place] NAME: Ruben [name]    MR#:  [id number]  DATE OF BIRTH:  1956-01-14  SUBJECTIVE:  Patient is depressed he says he feels useless. He denies SI. He has wheezing and SOB but improved  REVIEW OF SYSTEMS:    Review of Systems  Constitutional: Negative for fever, chills and malaise/fatigue.  HENT: Negative for sore throat.   Eyes: Negative for blurred vision.  Respiratory: Positive for cough, shortness of breath and wheezing. Negative for hemoptysis.   Cardiovascular: Negative for chest pain, palpitations and leg swelling.  Gastrointestinal: Negative for nausea, vomiting, abdominal pain, diarrhea and blood in stool.  Genitourinary: Negative for dysuria.  Musculoskeletal: Negative for back pain.  Neurological: Negative for dizziness, tremors and headaches.  Endo/Heme/Allergies: Does not bruise/bleed easily.  Psychiatric/Behavioral: Positive for depression. Negative for suicidal ideas.    Tolerating Diet:YES      DRUG ALLERGIES:  No Known Allergies  VITALS:  Blood pressure 156/77, pulse 100, temperature 98.4 F (36.9 C), temperature source Oral, resp. rate 20, height '5\' 9"'$  (1.753 m), weight 86.456 kg (190 lb 9.6 oz), SpO2 97 %.  PHYSICAL EXAMINATION:   Physical Exam  Constitutional: He is oriented to person, place, and time and well-developed, well-nourished, and in no distress. No distress.  HENT:  Head: Normocephalic.  Eyes: No scleral icterus.  Neck: Normal range of motion. Neck supple. No JVD present. No tracheal deviation present.  Cardiovascular: Normal rate, regular rhythm and normal heart sounds.  Exam reveals no gallop and no friction rub.   No murmur heard. Pulmonary/Chest: Effort normal. No respiratory distress. He has wheezes. He has no rales. He exhibits no tenderness.  Abdominal: Soft. Bowel sounds are normal. He exhibits no distension and no mass. There is no tenderness. There is no  rebound and no guarding.  Musculoskeletal: Normal range of motion. He exhibits no edema.  Neurological: He is alert and oriented to person, place, and time.  Skin: Skin is warm. No rash noted. No erythema.  Psychiatric: Affect and judgment normal.      LABORATORY PANEL:   CBC  Recent Labs Lab 09/25/14 0606  WBC 10.2  HGB 14.0  HCT 41.9  PLT 328   ------------------------------------------------------------------------------------------------------------------  Chemistries   Recent Labs Lab 09/25/14 0606  NA 137  K 3.9  CL 102  CO2 25  GLUCOSE 151*  BUN 13  CREATININE 0.85  CALCIUM 9.3   ------------------------------------------------------------------------------------------------------------------  Cardiac Enzymes  Recent Labs Lab 09/25/14 0021  TROPONINI <0.03   ------------------------------------------------------------------------------------------------------------------  RADIOLOGY:  Dg Chest Port 1 View  09/25/2014     IMPRESSION: Minimal bilateral atelectasis noted; lungs otherwise clear.   Electronically Signed   By: Garald Balding M.D.   On: 09/25/2014 01:04     ASSESSMENT AND PLAN:    This is a 59 year old male with COPD here with COPD exacerbation  1. Acute COPD exacerbation: Continue current steroids, azithromycin, NEBS and INHLAERS. Patient will continue with O2  2. Depression: Denies SI, but very teary due to situational factors. PSYCH placed.       Management plans discussed with the patient and he is in agreement.  CODE STATUS: DNR  TOTAL TIME TAKING CARE OF THIS PATIENT: 30 minutes.   Greater than 50% counseling and coordination of care  POSSIBLE D/C 1-2 days , DEPENDING ON CLINICAL CONDITION.   Satomi Buda M.D on 09/25/2014 at 12:16 PM  Between 7am to 6pm - Pager - [phone]  After 6pm go to [email or web address] - password [email or web address] [facility]  Office  [phone]  CC: Primary care physician;  Marden Noble, MD

## 2014-09-25 NOTE — Progress Notes (Signed)
Spoke with Dr. Lavetta Nielsen about pt's request for something to help him sleep tonight.  MD to put in new order. Clarise Cruz, RN

## 2014-09-25 NOTE — Plan of Care (Signed)
Problem: Discharge Progression Outcomes Goal: Discharge plan in place and appropriate Outcome: Progressing Individualized    Goal: Other Discharge Outcomes/Goals Outcome: Progressing Care plan progress to goal: Pain - pt has minimal pain Hemodynamically stable - pts vitals stable upon arrival to unit Activity - pt active and mobile

## 2014-09-25 NOTE — ED Provider Notes (Signed)
[facility] Emergency Department Provider Note  ____________________________________________  Time seen: 1:00 AM  I have reviewed the triage vital signs and the nursing notes.   HISTORY  Chief Complaint Shortness of Breath      HPI Ruben [name] is a 59 y.o. male presents with shortness of breath 3 days that has been progressive patient denies any fever no chest pain.Patient has history of COPD. Patient was treated by EMS and given Solu-Medrol 125 mg IV as well as 1 DuoNeb's and 2 albuterol breathing treatments.     Past Medical History  Diagnosis Date  . COPD (chronic obstructive pulmonary disease)     There are no active problems to display for this patient.   History reviewed. No pertinent past surgical history.  No current outpatient prescriptions on file.  Allergies Review of patient's allergies indicates no known allergies.  No family history on file.  Social History History  Substance Use Topics  . Smoking status: Current Every Day Smoker -- 1.50 packs/day for 43 years    Types: Cigarettes  . Smokeless tobacco: Not on file  . Alcohol Use: No    Review of Systems  Constitutional: Negative for fever. Eyes: Negative for visual changes. ENT: Negative for sore throat. Cardiovascular: Negative for chest pain. Respiratory: Positive for shortness of breath. Gastrointestinal: Negative for abdominal pain, vomiting and diarrhea. Genitourinary: Negative for dysuria. Musculoskeletal: Negative for back pain. Skin: Negative for rash. Neurological: Negative for headaches, focal weakness or numbness.   10-point ROS otherwise negative.  ____________________________________________   PHYSICAL EXAM:  VITAL SIGNS: ED Triage Vitals  Enc Vitals Group     BP 09/25/14 0012 117/98 mmHg     Pulse Rate 09/25/14 0012 102     Resp 09/25/14 0012 22     Temp 09/25/14 0012 98.1 F (36.7 C)     Temp Source 09/25/14 0012 Oral     SpO2  09/25/14 0012 95 %     Weight 09/25/14 0012 170 lb (77.111 kg)     Height 09/25/14 0012 '5\' 9"'$  (1.753 m)     Head Cir --      Peak Flow --      Pain Score 09/25/14 0013 4     Pain Loc --      Pain Edu? --      Excl. in [place]? --     Constitutional: Alert and oriented. Well appearing and in no distress. Eyes: Conjunctivae are normal. PERRL. Normal extraocular movements. ENT   Head: Normocephalic and atraumatic.   Nose: No congestion/rhinnorhea.   Mouth/Throat: Mucous membranes are moist.   Neck: No stridor. Hematological/Lymphatic/Immunilogical: No cervical lymphadenopathy. Cardiovascular: Normal rate, regular rhythm. Normal and symmetric distal pulses are present in all extremities. No murmurs, rubs, or gallops. Respiratory: Tachypnea, accessory muscle use, diffuse rhonchi with auscultation.  Gastrointestinal: Soft and nontender. No distention. There is no CVA tenderness. Genitourinary: deferred Musculoskeletal: Nontender with normal range of motion in all extremities. No joint effusions.  No lower extremity tenderness nor edema. Neurologic:  Normal speech and language. No gross focal neurologic deficits are appreciated. Speech is normal.  Skin:  Skin is warm, dry and intact. No rash noted. Psychiatric: Mood and affect are normal. Speech and behavior are normal. Patient exhibits appropriate insight and judgment.  ____________________________________________    LABS (pertinent positives/negatives)  Labs Reviewed  BASIC METABOLIC PANEL - Abnormal; Notable for the following:    Potassium 3.3 (*)    Glucose, Bld 114 (*)  All other components within normal limits  BLOOD GAS, ARTERIAL - Abnormal; Notable for the following:    pO2, Arterial 66 (*)    Allens test (pass/fail) POSITIVE (*)    All other components within normal limits  CBC  BRAIN NATRIURETIC PEPTIDE  TROPONIN I     ____________________________________________   EKG interpreted by me Dr. Marjean Donna   Date: 09/25/2014  Rate: 81  Rhythm: normal sinus rhythm  QRS Axis: normal  Intervals: normal  ST/T Wave abnormalities: normal  Conduction Disutrbances: none  Narrative Interpretation: unremarkable      ____________________________________________    RADIOLOGY  CXR: Negative  ____________________________________________     INITIAL IMPRESSION / ASSESSMENT AND PLAN / ED COURSE  Pertinent labs & imaging results that were available during my care of the patient were reviewed by me and considered in my medical decision making (see chart for details).  Patient continued work of breathing despite multiple DuoNeb's Solu-Medrol. Auscultation of lungs revealed diffuse rhonchi as such patient will be admitted to the hospital for continued COPD exacerbation  ____________________________________________   FINAL CLINICAL IMPRESSION(S) / ED DIAGNOSES  Final diagnoses:  COPD exacerbation      Gregor Hams, MD 09/25/14 [phone]

## 2014-09-25 NOTE — Plan of Care (Signed)
Problem: Discharge Progression Outcomes Goal: Discharge plan in place and appropriate Outcome: Progressing Individualization:  Pt wants to be called Ruben [name]. Lives w/wife and 59 yr old daughter.  Pt farms tobacco and grain. Wants to quit smoking. Goal: Other Discharge Outcomes/Goals Outcome: Progressing Plan of Care Progress to Goal: Pt breathing better on solumedrol.  But is still teary.  Psych consult called.  Pt commited to quitting smoking. Provided him w/info on smoking cessation and Nicotine patch ordered. Pt's K+ improved to 3.9 from 3.3.  Chest xray showed minimal bilateral atelectasis - lungs clear otherwise.

## 2014-09-25 NOTE — ED Notes (Signed)
RT at bedside.

## 2014-09-25 NOTE — ED Notes (Signed)
X-ray at bedside

## 2014-09-25 NOTE — ED Notes (Signed)
Dr. Jannifer Franklin in to see and assess patient for admission.

## 2014-09-25 NOTE — Consult Note (Signed)
[facility] Face-to-Face Psychiatry Consult   Reason for Consult:  Consult for this 59 year old man with a history of COPD and depression because of acute complaints of major depression Referring Physician:  Mody Patient Identification: Ruben [name] MRN:  [id number] Principal Diagnosis: COPD with exacerbation Diagnosis:   Patient Active Problem List   Diagnosis Date Noted  . COPD with exacerbation [J44.1] 09/25/2014  . Depression [F32.9] 09/25/2014  . COPD exacerbation [J44.1] 09/25/2014  . Severe recurrent major depression without psychotic features [F33.2] 09/25/2014    Total Time spent with patient: 1 hour  Subjective:   Ruben [name] is a 59 y.o. male patient admitted with "I just feel so worthless". Patient is complaining of severely depressed mood.  HPI:  Information from the patient and the chart. Patient came into the hospital with complaints of shortness of breath but immediately revealed that along with this he was very depressed. He reports that his mood is been sad and depressed probably for years but has been particularly bad for over a month. Energy level is very low. He is not enjoying much of anything. Feels hopeless about himself. Poor sleep at night. Appetite is satisfactory. Patient says that at times he has wished that God would take him but he denies that he has any thoughts about killing himself. He denies any hallucinations. He is not currently taking any psychiatric medicine or seeing anyone for therapy. He had specifically requested to talk to the psychiatrist. Patient cites major stresses in the form of the death of his father 8 years ago and also his own ongoing severe medical problems.  Past psychiatric history: Patient presents as though he has not had this problem before but a review of his old chart shows that this is long been identified as a major issue for him. It appears that he did have a psychiatric hospitalization many years ago during which time he was using  cocaine. Patient has been on antidepressive's in the past including Lexapro and Paxil. He indicates that he has not had any benefit. Also has taken trazodone without any clear-cut benefit by his report. No history evident of bipolar disorder or mania.  Substance abuse history: Patient denies that he drinks or has active drug use. Review of the chart indicates that he had a cocaine problem 20 years ago. He says he hasn't used in a long time.  Social history: Patient is living with his wife who is quite a bit younger than him. They have a young child 62 years old as well. 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 Wife works outside the home during the day. Patient lives across the street from his sister and says he has a good relationship with some members of his family. Street: Severe COPD which has limited his ability to work.  Family history: Does not know of any family history of mental illness  Current medications do not include any antidepressives. HPI Elements:   Quality:  Severe depression. Severity:  Severe with some passive suicidal thoughts. Timing:  This is been going on for years with waxing and waning. Duration:  Current symptoms at least a month. Context:  Chronic medical problems.  Past Medical History:  Past Medical History  Diagnosis Date  . COPD (chronic obstructive pulmonary disease)   . Depression     Past Surgical History  Procedure Laterality Date  . Elbow surgery Left   . Wrist surgery Left    Family History:  Family History  Problem Relation Age of Onset  . Heart disease    . Hypertension    . Diabetes     Social History:  History  Alcohol Use No     History  Drug Use  . Yes  . Special: Marijuana    History   Social History  . Marital Status: Single    Spouse Name: N/A  . Number of Children: N/A  . Years of Education: N/A   Social History Main Topics  . Smoking status: Current Every Day Smoker -- 2.00  packs/day for 43 years    Types: Cigarettes  . Smokeless tobacco: Not on file  . Alcohol Use: No  . Drug Use: Yes    Special: Marijuana  . Sexual Activity: Yes   Other Topics Concern  . None   Social History Narrative  . None   Additional Social History:                          Allergies:  No Known Allergies  Labs:  Results for orders placed or performed during the hospital encounter of 09/25/14 (from the past 48 hour(s))  CBC     Status: None   Collection Time: 09/25/14 12:21 AM  Result Value Ref Range   WBC 9.2 3.8 - 10.6 K/uL   RBC 4.97 4.40 - 5.90 MIL/uL   Hemoglobin 14.1 13.0 - 18.0 g/dL   HCT 41.6 40.0 - 52.0 %   MCV 83.7 80.0 - 100.0 fL   MCH 28.3 26.0 - 34.0 pg   MCHC 33.8 32.0 - 36.0 g/dL   RDW 14.3 11.5 - 14.5 %   Platelets 304 150 - 440 K/uL  Basic metabolic panel     Status: Abnormal   Collection Time: 09/25/14 12:21 AM  Result Value Ref Range   Sodium 139 135 - 145 mmol/L   Potassium 3.3 (L) 3.5 - 5.1 mmol/L   Chloride 102 101 - 111 mmol/L   CO2 27 22 - 32 mmol/L   Glucose, Bld 114 (H) 65 - 99 mg/dL   BUN 10 6 - 20 mg/dL   Creatinine, Ser 0.85 0.61 - 1.24 mg/dL   Calcium 9.1 8.9 - 10.3 mg/dL   GFR calc non Af Amer >60 >60 mL/min   GFR calc Af Amer >60 >60 mL/min    Comment: (NOTE) The eGFR has been calculated using the CKD EPI equation. This calculation has not been validated in all clinical situations. eGFR's persistently <60 mL/min signify possible Chronic Kidney Disease.    Anion gap 10 5 - 15  BNP (order ONLY if patient complains of dyspnea/SOB AND you have documented it for THIS visit)     Status: None   Collection Time: 09/25/14 12:21 AM  Result Value Ref Range   B Natriuretic Peptide 41.0 0.0 - 100.0 pg/mL  Troponin I     Status: None   Collection Time: 09/25/14 12:21 AM  Result Value Ref Range   Troponin I <0.03 <0.031 ng/mL    Comment:        NO INDICATION OF MYOCARDIAL INJURY.   Blood gas, arterial ([facility] & [facility] ONLY)      Status: Abnormal   Collection Time: 09/25/14  1:49 AM  Result Value Ref Range   FIO2 ROOM AIR %   pH, Arterial 7.45 7.350 - 7.450   pCO2 arterial 39 32.0 - 48.0 mmHg   pO2, Arterial 66 (L) 83.0 - 108.0 mmHg  Bicarbonate 27.1 21.0 - 28.0 mEq/L   Acid-Base Excess 3.0 0.0 - 3.0 mmol/L   O2 Saturation 93.6 %   Patient temperature 37.0    Collection site RIGHT RADIAL    Sample type ARTERIAL DRAW    Allens test (pass/fail) POSITIVE (A) PASS  CBC     Status: Abnormal   Collection Time: 09/25/14  6:06 AM  Result Value Ref Range   WBC 10.2 3.8 - 10.6 K/uL   RBC 4.99 4.40 - 5.90 MIL/uL   Hemoglobin 14.0 13.0 - 18.0 g/dL   HCT 41.9 40.0 - 52.0 %   MCV 83.9 80.0 - 100.0 fL   MCH 28.1 26.0 - 34.0 pg   MCHC 33.5 32.0 - 36.0 g/dL   RDW 14.6 (H) 11.5 - 14.5 %   Platelets 328 150 - 440 K/uL  Basic metabolic panel     Status: Abnormal   Collection Time: 09/25/14  6:06 AM  Result Value Ref Range   Sodium 137 135 - 145 mmol/L   Potassium 3.9 3.5 - 5.1 mmol/L   Chloride 102 101 - 111 mmol/L   CO2 25 22 - 32 mmol/L   Glucose, Bld 151 (H) 65 - 99 mg/dL   BUN 13 6 - 20 mg/dL   Creatinine, Ser 0.85 0.61 - 1.24 mg/dL   Calcium 9.3 8.9 - 10.3 mg/dL   GFR calc non Af Amer >60 >60 mL/min   GFR calc Af Amer >60 >60 mL/min    Comment: (NOTE) The eGFR has been calculated using the CKD EPI equation. This calculation has not been validated in all clinical situations. eGFR's persistently <60 mL/min signify possible Chronic Kidney Disease.    Anion gap 10 5 - 15    Vitals: Blood pressure 158/93, pulse 94, temperature 98.3 F (36.8 C), temperature source Oral, resp. rate 18, height '5\' 9"'  (1.753 m), weight 86.456 kg (190 lb 9.6 oz), SpO2 97 %.  Risk to Self: Is patient at risk for suicide?: No Risk to Others:   Prior Inpatient Therapy:   Prior Outpatient Therapy:    Current Facility-Administered Medications  Medication Dose Route Frequency Provider Last Rate Last Dose  . acetaminophen (TYLENOL)  tablet 650 mg  650 mg Oral Q6H PRN Lance Coon, MD   650 mg at 09/25/14 0604   Or  . acetaminophen (TYLENOL) suppository 650 mg  650 mg Rectal Q6H PRN Lance Coon, MD      . azithromycin (ZITHROMAX) 500 mg in dextrose 5 % 250 mL IVPB  500 mg Intravenous Q24H Lance Coon, MD   500 mg at 09/25/14 0558  . enoxaparin (LOVENOX) injection 40 mg  40 mg Subcutaneous Q24H Lance Coon, MD   40 mg at 09/25/14 1126  . ipratropium-albuterol (DUONEB) 0.5-2.5 (3) MG/3ML nebulizer solution 3 mL  3 mL Nebulization Q6H WA Sital Mody, MD      . methylPREDNISolone sodium succinate (SOLU-MEDROL) 125 mg/2 mL injection 60 mg  60 mg Intravenous Q6H Lance Coon, MD   60 mg at 09/25/14 1515  . mirtazapine (REMERON) tablet 30 mg  30 mg Oral QHS Gonzella Lex, MD      . mometasone-formoterol [facility]) 100-5 MCG/ACT inhaler 2 puff  2 puff Inhalation BID Lance Coon, MD   2 puff at 09/25/14 [phone]  . nicotine (NICODERM CQ - dosed in mg/24 hours) patch 21 mg  21 mg Transdermal Daily Bettey Costa, MD   21 mg at 09/25/14 1129  . ondansetron (ZOFRAN) tablet 4 mg  4 mg  Oral Q6H PRN Lance Coon, MD       Or  . ondansetron [facility]) injection 4 mg  4 mg Intravenous Q6H PRN Lance Coon, MD      . senna-docusate (Senokot-S) tablet 1 tablet  1 tablet Oral QHS PRN Lance Coon, MD      . sodium chloride 0.9 % injection 3 mL  3 mL Intravenous Q12H Lance Coon, MD   3 mL at 09/25/14 1130    Musculoskeletal: Strength & Muscle Tone: within normal limits Gait & Station: normal Patient leans: N/A  Psychiatric Specialty Exam: Physical Exam  Constitutional: He appears well-developed and well-nourished. He appears distressed.  HENT:  Head: Normocephalic and atraumatic.  Eyes: Conjunctivae are normal. Pupils are equal, round, and reactive to light.  Neck: Normal range of motion.  Cardiovascular: Normal heart sounds.   Respiratory: He is in respiratory distress.  GI: Soft.  Musculoskeletal: Normal range of motion.  Neurological:  He is alert.  Skin: Skin is warm and dry.  Psychiatric: His speech is normal. Judgment and thought content normal. His mood appears anxious. He is agitated. Cognition and memory are normal. He exhibits a depressed mood.  Patient was extremely sad and tearful throughout the interview. Very depressed. No sign however of psychosis. Denies suicidal thoughts.    Review of Systems  Constitutional: Positive for malaise/fatigue and diaphoresis.  HENT: Negative.   Eyes: Negative.   Respiratory: Positive for shortness of breath.   Cardiovascular: Negative.   Gastrointestinal: Negative.   Musculoskeletal: Negative.   Skin: Negative.   Neurological: Positive for weakness.  Psychiatric/Behavioral: Positive for depression. Negative for suicidal ideas, hallucinations and substance abuse. The patient is nervous/anxious and has insomnia.     Blood pressure 158/93, pulse 94, temperature 98.3 F (36.8 C), temperature source Oral, resp. rate 18, height '5\' 9"'  (1.753 m), weight 86.456 kg (190 lb 9.6 oz), SpO2 97 %.Body mass index is 28.13 kg/(m^2).  General Appearance: Casual  Eye Contact::  Good  Speech:  Normal Rate  Volume:  Normal  Mood:  Depressed  Affect:  Depressed and Tearful  Thought Process:  Logical  Orientation:  Full (Time, Place, and Person)  Thought Content:  Negative  Suicidal Thoughts:  No  Homicidal Thoughts:  No  Memory:  Immediate;   Good Recent;   Fair Remote;   Fair  Judgement:  Intact  Insight:  Fair  Psychomotor Activity:  Decreased  Concentration:  Fair  Recall:  [employer] of Knowledge:Fair  Language: Fair  Akathisia:  No  Handed:  Right  AIMS (if indicated):     Assets:  Communication Skills Desire for Improvement Housing Social Support  ADL's:  Intact  Cognition: WNL  Sleep:      Medical Decision Making: Review of Psycho-Social Stressors (1), Review or order clinical lab tests (1), Established Problem, Worsening (2), Review of Last Therapy Session (1), Review  of Medication Regimen & Side Effects (2) and Review of New Medication or Change in Dosage (2)  Treatment Plan Summary: Medication management and Plan 59 year old man presents with multiple symptoms of severe major depression. He is tearful and somewhat agitated during the interview but there is no sign of psychosis. He is very clear that he has no thoughts of killing himself. The patient does not present as having much memory of what medicines he has taken in the past. Review of the chart shows evidence of at least 2 trials of serotonin reuptake inhibitor. I suggest that we try starting him on  mirtazapine 30 mg at night with a plan to then increase to 45. Patient really needs to be seeing an outpatient psychiatrist. He will be given information about referrals to local providers prior to discharge. Supportive counseling. Educational counseling. Review of old chart and current labs and records. I will continue to follow-up in the hospital.  Plan:  Patient does not meet criteria for psychiatric inpatient admission. Supportive therapy provided about ongoing stressors. Discussed crisis plan, support from social network, calling 911, coming to the Emergency Department, and calling Suicide Hotline. Disposition: No indication for psychiatric hospitalization. Begin medication management here. Follow-up as needed  Alethia Berthold 09/25/2014 6:47 PM

## 2014-09-26 MED ORDER — NICOTINE 21 MG/24HR TD PT24: 21.0000 mg | MEDICATED_PATCH | Freq: Every day | TRANSDERMAL | Status: DC

## 2014-09-26 MED ORDER — MIRTAZAPINE 30 MG PO TABS: 30.0000 mg | ORAL_TABLET | Freq: Every day | ORAL | Status: DC

## 2014-09-26 MED ORDER — MOMETASONE FURO-FORMOTEROL FUM 100-5 MCG/ACT IN AERO: 2.0000 | INHALATION_SPRAY | Freq: Two times a day (BID) | RESPIRATORY_TRACT | Status: DC

## 2014-09-26 MED ORDER — MIRTAZAPINE 45 MG PO TABS: 45.0000 mg | ORAL_TABLET | Freq: Every day | ORAL | Status: DC

## 2014-09-26 MED ORDER — AZITHROMYCIN 250 MG PO TABS: 250.0000 mg | ORAL_TABLET | Freq: Every day | ORAL | Status: DC

## 2014-09-26 MED ORDER — METHYLPREDNISOLONE 4 MG PO TABS: 4.0000 mg | ORAL_TABLET | Freq: Every day | ORAL | Status: DC

## 2014-09-26 NOTE — Discharge Instructions (Signed)
Chronic Obstructive Pulmonary Disease Exacerbation ° Chronic obstructive pulmonary disease (COPD) is a common lung problem. In COPD, the flow of air from the lungs is limited. COPD exacerbations are times that breathing gets worse and you need extra treatment. Without treatment they can be life threatening. If they happen often, your lungs can become more damaged. °HOME CARE °· Do not smoke. °· Avoid tobacco smoke and other things that bother your lungs. °· If given, take your antibiotic medicine as told. Finish the medicine even if you start to feel better. °· Only take medicines as told by your doctor. °· Drink enough fluids to keep your pee (urine) clear or pale yellow (unless your doctor has told you not to). °· Use a cool mist machine (vaporizer). °· If you use oxygen or a machine that turns liquid medicine into a mist (nebulizer), continue to use them as told. °· Keep up with shots (vaccinations) as told by your doctor. °· Exercise regularly. °· Eat healthy foods. °· Keep all doctor visits as told. °GET HELP RIGHT AWAY IF: °· You are very short of breath and it gets worse. °· You have trouble talking. °· You have bad chest pain. °· You have blood in your spit (sputum). °· You have a fever. °· You keep throwing up (vomiting). °· You feel weak, or you pass out (faint). °· You feel confused. °· You keep getting worse. °MAKE SURE YOU:  °· Understand these instructions. °· Will watch your condition. °· Will get help right away if you are not doing well or get worse. °Document Released: 03/18/2011 Document Revised: 01/17/2013 Document Reviewed: 12/01/2012 °[employer]® Patient Information ©2015 [employer]. This information is not intended to replace advice given to you by your health care provider. Make sure you discuss any questions you have with your health care provider. ° °

## 2014-09-26 NOTE — Plan of Care (Signed)
Problem: Discharge Progression Outcomes Goal: Other Discharge Outcomes/Goals Outcome: Progressing Plan of care progress to goal for: Pain-no c/o pain this shift Hemodynamically-VSS Complications-pt seemed teary and depressed, gave pt support and reassurances Diet-pt tolerating diet at this time Activity-pt up to BR with 1 assist

## 2014-09-26 NOTE — Discharge Summary (Signed)
[place] [facility] [place] NAME: Ruben [name]    MR#:  [id number]  DATE OF BIRTH:  1956-01-15  DATE OF ADMISSION:  09/25/2014 ADMITTING PHYSICIAN: Ruben Coon, MD  DATE OF DISCHARGE: 09/26/2014 PRIMARY CARE PHYSICIAN: Ruben Noble, MD    ADMISSION DIAGNOSIS:  COPD exacerbation [J44.1]  DISCHARGE DIAGNOSIS:  Principal Problem:   COPD with exacerbation Active Problems:   Depression   COPD exacerbation   Severe recurrent major depression without psychotic features   SECONDARY DIAGNOSIS:   Past Medical History  Diagnosis Date  . COPD (chronic obstructive pulmonary disease)   . Depression     HOSPITAL COURSE:  This is a very pleasant 59 year old male with COPD who presented with COPD exacerbation and depression. For further details please refer the H&P.  1. COPD acute exacerbation: Patient was admitted to the hospital service. He was started on IV steroids, DuoNeb's, inhalers and azithromycin. His chest x-ray showed no evidence of pneumonia. He tolerated this regimen well. At discharge his lungs are clear to auscultation without wheezing. He does require oxygen at discharge. I've asked the case manager to help assist with this.  2. Depression: Patient endorses symptoms of depression. Psychiatry was consulted. They recommended starting the patient on Remeron 30 mg at bedtime and tapering this up to 45 mg. Patient denied any suicidal or homicidal ideations. Patient was also given names of psychiatrists in the area.  3. Tobacco dependence: Patient is highly encouraged to stop smoking. Patient was counseled for 3 minutes.   DISCHARGE CONDITIONS AND DIET:  Patient will be discharged home with a regular diet  CONSULTS OBTAINED:  Treatment Team:  Ruben Lex, MD  DRUG ALLERGIES:  No Known Allergies  DISCHARGE MEDICATIONS:   Current Discharge Medication List    START taking these medications   Details  azithromycin  (ZITHROMAX) 250 MG tablet Take 1 tablet (250 mg total) by mouth daily. Qty: 6 each, Refills: 0    methylPREDNISolone (MEDROL) 4 MG tablet Take 1 tablet (4 mg total) by mouth daily. MEDROL DOS PAK 4 mg tabs taper 6 days Qty: 20 tablet, Refills: 0    !! mirtazapine (REMERON) 30 MG tablet Take 1 tablet (30 mg total) by mouth at bedtime. Qty: 15 tablet, Refills: 0    !! mirtazapine (REMERON) 45 MG tablet Take 1 tablet (45 mg total) by mouth at bedtime. Qty: 30 tablet, Refills: 0    mometasone-formoterol (DULERA) 100-5 MCG/ACT AERO Inhale 2 puffs into the lungs 2 (two) times daily. Qty: 1 Inhaler, Refills: 0    nicotine (NICODERM CQ - DOSED IN MG/24 HOURS) 21 mg/24hr patch Place 1 patch (21 mg total) onto the skin daily. Qty: 28 patch, Refills: 0     !! - Potential duplicate medications found. Please discuss with provider.    CONTINUE these medications which have NOT CHANGED   Details  albuterol (PROVENTIL HFA;VENTOLIN HFA) 108 (90 BASE) MCG/ACT inhaler Inhale 2 puffs into the lungs every 6 (six) hours as needed for wheezing or shortness of breath.    clonazePAM (KLONOPIN) 0.5 MG tablet Take 0.5 mg by mouth 2 (two) times daily as needed for anxiety.    omeprazole (PRILOSEC) 10 MG capsule Take 10 mg by mouth daily.    simvastatin (ZOCOR) 10 MG tablet Take 10 mg by mouth daily.              Today   CHIEF COMPLAINT:  Patient is doing well this morning.  Patient reports that his interaction with the psychiatrist went very well yesterday. Patient denies shortness of breath or wheezing. Patient is stable for discharge home today.   VITAL SIGNS:  Blood pressure 132/82, pulse 80, temperature 97.9 F (36.6 C), temperature source Oral, resp. rate 18, height '5\' 9"'$  (1.753 m), weight 85.367 kg (188 lb 3.2 oz), SpO2 93 %.   REVIEW OF SYSTEMS:  Review of Systems  Constitutional: Negative for fever, chills and malaise/fatigue.  HENT: Negative for sore throat.   Eyes: Negative for  blurred vision.  Respiratory: Negative for cough, hemoptysis, shortness of breath and wheezing.   Cardiovascular: Negative for chest pain, palpitations and leg swelling.  Gastrointestinal: Negative for nausea, vomiting, abdominal pain, diarrhea and blood in stool.  Genitourinary: Negative for dysuria.  Musculoskeletal: Negative for back pain.  Neurological: Negative for dizziness, tremors and headaches.  Endo/Heme/Allergies: Does not bruise/bleed easily.  Psychiatric/Behavioral: Positive for depression. Negative for suicidal ideas. The patient is not nervous/anxious and does not have insomnia.      PHYSICAL EXAMINATION:  GENERAL:  59 y.o.-year-old patient lying in the bed with no acute distress.  NECK:  Supple, no jugular venous distention. No thyroid enlargement, no tenderness.  LUNGS: Normal breath sounds bilaterally, no wheezing, rales,rhonchi  No use of accessory muscles of respiration.  CARDIOVASCULAR: S1, S2 normal. No murmurs, rubs, or gallops.  ABDOMEN: Soft, non-tender, non-distended. Bowel sounds present. No organomegaly or mass.  EXTREMITIES: No pedal edema, cyanosis, or clubbing.  PSYCHIATRIC: The patient is alert and oriented x 3.  SKIN: No obvious rash, lesion, or ulcer.   DATA REVIEW:   CBC  Recent Labs Lab 09/25/14 0606  WBC 10.2  HGB 14.0  HCT 41.9  PLT 328    Chemistries   Recent Labs Lab 09/25/14 0606  NA 137  K 3.9  CL 102  CO2 25  GLUCOSE 151*  BUN 13  CREATININE 0.85  CALCIUM 9.3    Cardiac Enzymes  Recent Labs Lab 09/25/14 0021  TROPONINI <0.03    Microbiology Results  '@[id number]'$ @  RADIOLOGY:  Dg Chest Port 1 View  09/25/2014   .  IMPRESSION: Minimal bilateral atelectasis noted; lungs otherwise clear.   Electronically Signed   By: Ruben [name] M.D.   On: 09/25/2014 01:04      Management plans discussed with the patient and he is in agreement. Stable for discharge home  Patient should follow up with PCP and  psychiatry  CODE STATUS:     Code Status Orders        Start     Ordered   09/25/14 0434  Do not attempt resuscitation (DNR)   Continuous    Question Answer Comment  In the event of cardiac or respiratory ARREST Do not call a "code blue"   In the event of cardiac or respiratory ARREST Do not perform Intubation, CPR, defibrillation or ACLS   In the event of cardiac or respiratory ARREST Use medication by any route, position, wound care, and other measures to relive pain and suffering. May use oxygen, suction and manual treatment of airway obstruction as needed for comfort.      09/25/14 0434      TOTAL TIME TAKING CARE OF THIS PATIENT: 35 minutes.    Ruben [name] M.D on 09/26/2014 at 11:22 AM  Between 7am to 6pm - Pager - [phone] After 6pm go to [email or web address] - password [email or web address] [facility]  [facility]  Office  [phone]  CC: Primary care physician; Ruben [name],  Ruben Hawthorne, MD

## 2014-11-04 ENCOUNTER — Telehealth: Payer: Self-pay

## 2014-11-04 ENCOUNTER — Ambulatory Visit: Payer: Medicaid Other | Admitting: Psychiatry

## 2014-11-05 NOTE — Telephone Encounter (Signed)
Error

## 2015-06-28 ENCOUNTER — Inpatient Hospital Stay
Admission: EM | Admit: 2015-06-28 | Discharge: 2015-07-01 | DRG: 190 | Disposition: A | Discharge status: Home / Self Care | Payer: Medicaid Other | Attending: Internal Medicine | Admitting: Internal Medicine

## 2015-06-28 ENCOUNTER — Emergency Department: Payer: Medicaid Other

## 2015-06-28 ENCOUNTER — Encounter: Payer: Self-pay | Admitting: Emergency Medicine

## 2015-06-28 ENCOUNTER — Inpatient Hospital Stay: Payer: Medicaid Other

## 2015-06-28 DIAGNOSIS — F329 Major depressive disorder, single episode, unspecified: Secondary | ICD-10-CM | POA: Diagnosis present

## 2015-06-28 DIAGNOSIS — J9601 Acute respiratory failure with hypoxia: Secondary | ICD-10-CM | POA: Diagnosis present

## 2015-06-28 DIAGNOSIS — R52 Pain, unspecified: Secondary | ICD-10-CM

## 2015-06-28 DIAGNOSIS — Z8673 Personal history of transient ischemic attack (TIA), and cerebral infarction without residual deficits: Secondary | ICD-10-CM

## 2015-06-28 DIAGNOSIS — F419 Anxiety disorder, unspecified: Secondary | ICD-10-CM | POA: Diagnosis present

## 2015-06-28 DIAGNOSIS — F17210 Nicotine dependence, cigarettes, uncomplicated: Secondary | ICD-10-CM | POA: Diagnosis present

## 2015-06-28 DIAGNOSIS — E876 Hypokalemia: Secondary | ICD-10-CM | POA: Diagnosis present

## 2015-06-28 DIAGNOSIS — Z79899 Other long term (current) drug therapy: Secondary | ICD-10-CM

## 2015-06-28 DIAGNOSIS — Z833 Family history of diabetes mellitus: Secondary | ICD-10-CM | POA: Diagnosis not present

## 2015-06-28 DIAGNOSIS — K8020 Calculus of gallbladder without cholecystitis without obstruction: Secondary | ICD-10-CM | POA: Diagnosis present

## 2015-06-28 DIAGNOSIS — Z8249 Family history of ischemic heart disease and other diseases of the circulatory system: Secondary | ICD-10-CM | POA: Diagnosis not present

## 2015-06-28 DIAGNOSIS — J441 Chronic obstructive pulmonary disease with (acute) exacerbation: Secondary | ICD-10-CM | POA: Diagnosis not present

## 2015-06-28 DIAGNOSIS — J1000 Influenza due to other identified influenza virus with unspecified type of pneumonia: Secondary | ICD-10-CM | POA: Diagnosis present

## 2015-06-28 DIAGNOSIS — J189 Pneumonia, unspecified organism: Secondary | ICD-10-CM | POA: Diagnosis present

## 2015-06-28 DIAGNOSIS — J440 Chronic obstructive pulmonary disease with acute lower respiratory infection: Secondary | ICD-10-CM | POA: Diagnosis not present

## 2015-06-28 DIAGNOSIS — R1010 Upper abdominal pain, unspecified: Secondary | ICD-10-CM

## 2015-06-28 HISTORY — DX: Cerebral infarction, unspecified: I63.9

## 2015-06-28 LAB — COMPREHENSIVE METABOLIC PANEL
ALT: 16 U/L — AB (ref 17–63)
AST: 23 U/L (ref 15–41)
Albumin: 3.8 g/dL (ref 3.5–5.0)
Alkaline Phosphatase: 50 U/L (ref 38–126)
Anion gap: 9 (ref 5–15)
BILIRUBIN TOTAL: 0.5 mg/dL (ref 0.3–1.2)
BUN: 13 mg/dL (ref 6–20)
CALCIUM: 8.2 mg/dL — AB (ref 8.9–10.3)
CO2: 26 mmol/L (ref 22–32)
Chloride: 97 mmol/L — ABNORMAL LOW (ref 101–111)
Creatinine, Ser: 0.97 mg/dL (ref 0.61–1.24)
GFR calc non Af Amer: >60 mL/min (ref >60)
GLUCOSE: 120 mg/dL — AB (ref 65–99)
Potassium: 3.2 mmol/L — ABNORMAL LOW (ref 3.5–5.1)
SODIUM: 132 mmol/L — AB (ref 135–145)
TOTAL PROTEIN: 7 g/dL (ref 6.5–8.1)

## 2015-06-28 LAB — CBC
HCT: 40.6 % (ref 40.0–52.0)
HEMOGLOBIN: 14.1 g/dL (ref 13.0–18.0)
MCH: 28.3 pg (ref 26.0–34.0)
MCHC: 34.9 g/dL (ref 32.0–36.0)
MCV: 81.1 fL (ref 80.0–100.0)
Platelets: 143 10*3/uL — ABNORMAL LOW (ref 150–440)
RBC: 5 MIL/uL (ref 4.40–5.90)
RDW: 15.3 % — ABNORMAL HIGH (ref 11.5–14.5)
WBC: 8.8 10*3/uL (ref 3.8–10.6)

## 2015-06-28 LAB — INFLUENZA PANEL BY PCR (TYPE A & B)
H1N1 flu by pcr: NOT DETECTED
INFLAPCR: NEGATIVE
Influenza B By PCR: POSITIVE — AB

## 2015-06-28 LAB — TROPONIN I: Troponin I: <0.03 ng/mL (ref <0.031)

## 2015-06-28 LAB — RAPID INFLUENZA A&B ANTIGENS
Influenza A (ARMC): NEGATIVE
Influenza B (ARMC): NEGATIVE

## 2015-06-28 MED ORDER — ACETAMINOPHEN 650 MG RE SUPP: 650.0000 mg | Freq: Four times a day (QID) | RECTAL | Status: DC | PRN

## 2015-06-28 MED ORDER — SENNOSIDES-DOCUSATE SODIUM 8.6-50 MG PO TABS: 1.0000 | ORAL_TABLET | Freq: Every evening | ORAL | Status: DC | PRN

## 2015-06-28 MED ORDER — METHYLPREDNISOLONE SODIUM SUCC 125 MG IJ SOLR
125.0000 mg | Freq: Once | INTRAMUSCULAR | Status: AC
  Administered 2015-06-28: 125 mg via INTRAVENOUS
  Filled 2015-06-28: qty 2

## 2015-06-28 MED ORDER — ALBUTEROL SULFATE (2.5 MG/3ML) 0.083% IN NEBU
2.5000 mg | INHALATION_SOLUTION | Freq: Once | RESPIRATORY_TRACT | Status: AC
  Administered 2015-06-28: 2.5 mg via RESPIRATORY_TRACT
  Filled 2015-06-28: qty 3

## 2015-06-28 MED ORDER — DEXTROSE 5 % IV SOLN
500.0000 mg | Freq: Once | INTRAVENOUS | Status: AC
  Administered 2015-06-28: 500 mg via INTRAVENOUS
  Filled 2015-06-28: qty 500

## 2015-06-28 MED ORDER — NICOTINE 21 MG/24HR TD PT24
21.0000 mg | MEDICATED_PATCH | Freq: Every day | TRANSDERMAL | Status: DC
  Administered 2015-06-28 – 2015-07-01 (×4): 21 mg via TRANSDERMAL
  Filled 2015-06-28 (×4): qty 1

## 2015-06-28 MED ORDER — ENOXAPARIN SODIUM 40 MG/0.4ML ~~LOC~~ SOLN
40.0000 mg | SUBCUTANEOUS | Status: DC
  Administered 2015-06-28 – 2015-06-30 (×3): 40 mg via SUBCUTANEOUS
  Filled 2015-06-28 (×3): qty 0.4

## 2015-06-28 MED ORDER — HYDROCODONE-ACETAMINOPHEN 5-325 MG PO TABS
1.0000 | ORAL_TABLET | ORAL | Status: DC | PRN
  Administered 2015-06-28: 2 via ORAL
  Administered 2015-06-28: 1 via ORAL
  Administered 2015-06-29: 2 via ORAL
  Filled 2015-06-28: qty 2
  Filled 2015-06-28: qty 1
  Filled 2015-06-28: qty 2

## 2015-06-28 MED ORDER — POTASSIUM CHLORIDE CRYS ER 20 MEQ PO TBCR: 40.0000 meq | EXTENDED_RELEASE_TABLET | ORAL | Status: DC | PRN

## 2015-06-28 MED ORDER — CLONAZEPAM 0.5 MG PO TABS
0.5000 mg | ORAL_TABLET | Freq: Two times a day (BID) | ORAL | Status: DC | PRN
  Administered 2015-06-28 – 2015-06-30 (×3): 0.5 mg via ORAL
  Filled 2015-06-28 (×3): qty 1

## 2015-06-28 MED ORDER — ONDANSETRON HCL 4 MG PO TABS: 4.0000 mg | ORAL_TABLET | Freq: Four times a day (QID) | ORAL | Status: DC | PRN

## 2015-06-28 MED ORDER — DEXTROSE 5 % IV SOLN
1.0000 g | Freq: Once | INTRAVENOUS | Status: AC
  Administered 2015-06-28: 1 g via INTRAVENOUS
  Filled 2015-06-28: qty 10

## 2015-06-28 MED ORDER — MIRTAZAPINE 15 MG PO TABS
45.0000 mg | ORAL_TABLET | Freq: Every day | ORAL | Status: DC
  Administered 2015-06-28 – 2015-06-29 (×2): 45 mg via ORAL
  Filled 2015-06-28 (×2): qty 3

## 2015-06-28 MED ORDER — OSELTAMIVIR PHOSPHATE 75 MG PO CAPS
75.0000 mg | ORAL_CAPSULE | Freq: Two times a day (BID) | ORAL | Status: DC
  Administered 2015-06-28 – 2015-07-01 (×6): 75 mg via ORAL
  Filled 2015-06-28 (×6): qty 1

## 2015-06-28 MED ORDER — LEVOFLOXACIN IN D5W 750 MG/150ML IV SOLN
750.0000 mg | INTRAVENOUS | Status: DC
  Administered 2015-06-29 – 2015-06-30 (×2): 750 mg via INTRAVENOUS
  Filled 2015-06-28 (×2): qty 150

## 2015-06-28 MED ORDER — PANTOPRAZOLE SODIUM 40 MG PO TBEC
40.0000 mg | DELAYED_RELEASE_TABLET | Freq: Every day | ORAL | Status: DC
  Administered 2015-06-28 – 2015-07-01 (×4): 40 mg via ORAL
  Filled 2015-06-28 (×4): qty 1

## 2015-06-28 MED ORDER — ACETAMINOPHEN 325 MG PO TABS
650.0000 mg | ORAL_TABLET | Freq: Four times a day (QID) | ORAL | Status: DC | PRN
  Filled 2015-06-28 (×2): qty 2

## 2015-06-28 MED ORDER — ALUM & MAG HYDROXIDE-SIMETH 200-200-20 MG/5ML PO SUSP: 30.0000 mL | Freq: Four times a day (QID) | ORAL | Status: DC | PRN

## 2015-06-28 MED ORDER — POTASSIUM CHLORIDE IN NACL 20-0.9 MEQ/L-% IV SOLN
INTRAVENOUS | Status: DC
  Administered 2015-06-28: 14:00:00 via INTRAVENOUS
  Filled 2015-06-28 (×3): qty 1000

## 2015-06-28 MED ORDER — ONDANSETRON HCL 4 MG/2ML IJ SOLN: 4.0000 mg | Freq: Four times a day (QID) | INTRAMUSCULAR | Status: DC | PRN

## 2015-06-28 MED ORDER — ALBUTEROL SULFATE (2.5 MG/3ML) 0.083% IN NEBU
3.0000 mL | INHALATION_SOLUTION | Freq: Four times a day (QID) | RESPIRATORY_TRACT | Status: DC | PRN
  Administered 2015-06-28: 3 mL via RESPIRATORY_TRACT
  Filled 2015-06-28: qty 3

## 2015-06-28 MED ORDER — IPRATROPIUM-ALBUTEROL 0.5-2.5 (3) MG/3ML IN SOLN
3.0000 mL | Freq: Once | RESPIRATORY_TRACT | Status: AC
  Administered 2015-06-28: 3 mL via RESPIRATORY_TRACT
  Filled 2015-06-28: qty 3

## 2015-06-28 MED ORDER — MOMETASONE FURO-FORMOTEROL FUM 100-5 MCG/ACT IN AERO
2.0000 | INHALATION_SPRAY | Freq: Two times a day (BID) | RESPIRATORY_TRACT | Status: DC
  Administered 2015-06-28 – 2015-06-30 (×5): 2 via RESPIRATORY_TRACT
  Filled 2015-06-28: qty 8.8

## 2015-06-28 MED ORDER — METHYLPREDNISOLONE SODIUM SUCC 125 MG IJ SOLR
60.0000 mg | Freq: Three times a day (TID) | INTRAMUSCULAR | Status: DC
  Administered 2015-06-28 – 2015-06-29 (×3): 60 mg via INTRAVENOUS
  Filled 2015-06-28 (×3): qty 2

## 2015-06-28 MED ORDER — SIMVASTATIN 10 MG PO TABS
10.0000 mg | ORAL_TABLET | Freq: Every day | ORAL | Status: DC
  Administered 2015-06-28 – 2015-06-29 (×2): 10 mg via ORAL
  Filled 2015-06-28 (×3): qty 1

## 2015-06-28 NOTE — ED Notes (Signed)
Pending u/s. Called and will come get pt within 10 min per report. Will transport to floor once u/s completed.

## 2015-06-28 NOTE — ED Provider Notes (Signed)
[facility] Emergency Department Provider Note  ____________________________________________    I have reviewed the triage vital signs and the nursing notes.   HISTORY  Chief Complaint Shortness of Breath    HPI Ruben [name] is a 60 y.o. male who presents with short of breath. Patient has a history of COPD and feels this is similar. He reports 2-3 days of worsening breathing which became much worse last night. He has had a cough but denies fevers. He has tried nebulizers at home with little relief. He denies chest pain. No recent travel. No calf pain or swelling.     Past Medical History  Diagnosis Date  . COPD (chronic obstructive pulmonary disease) ([place])   . Depression     Patient Active Problem List   Diagnosis Date Noted  . COPD with exacerbation ([place]) 09/25/2014  . Depression 09/25/2014  . COPD exacerbation ([place]) 09/25/2014  . Severe recurrent major depression without psychotic features ([place]) 09/25/2014    Past Surgical History  Procedure Laterality Date  . Elbow surgery Left   . Wrist surgery Left     Current Outpatient Rx  Name  Route  Sig  Dispense  Refill  . albuterol (PROVENTIL HFA;VENTOLIN HFA) 108 (90 BASE) MCG/ACT inhaler   Inhalation   Inhale 2 puffs into the lungs every 6 (six) hours as needed for wheezing or shortness of breath.         Marland Kitchen azithromycin (ZITHROMAX) 250 MG tablet   Oral   Take 1 tablet (250 mg total) by mouth daily.   6 each   0   . clonazePAM (KLONOPIN) 0.5 MG tablet   Oral   Take 0.5 mg by mouth 2 (two) times daily as needed for anxiety.         . methylPREDNISolone (MEDROL) 4 MG tablet   Oral   Take 1 tablet (4 mg total) by mouth daily. MEDROL DOS PAK 4 mg tabs taper 6 days   20 tablet   0   . mirtazapine (REMERON) 30 MG tablet   Oral   Take 1 tablet (30 mg total) by mouth at bedtime.   15 tablet   0   . mirtazapine (REMERON) 45 MG tablet   Oral   Take 1 tablet (45 mg total) by mouth  at bedtime.   30 tablet   0   . mometasone-formoterol (DULERA) 100-5 MCG/ACT AERO   Inhalation   Inhale 2 puffs into the lungs 2 (two) times daily.   1 Inhaler   0   . nicotine (NICODERM CQ - DOSED IN MG/24 HOURS) 21 mg/24hr patch   Transdermal   Place 1 patch (21 mg total) onto the skin daily.   28 patch   0   . omeprazole (PRILOSEC) 10 MG capsule   Oral   Take 10 mg by mouth daily.         . simvastatin (ZOCOR) 10 MG tablet   Oral   Take 10 mg by mouth daily.           Allergies Review of patient's allergies indicates no known allergies.  Family History  Problem Relation Age of Onset  . Heart disease    . Hypertension    . Diabetes      Social History Social History  Substance Use Topics  . Smoking status: Current Every Day Smoker -- 2.00 packs/day for 43 years    Types: Cigarettes  . Smokeless tobacco: None  . Alcohol Use: No  Review of Systems  Constitutional: Negative for fever. Eyes: Negative for redness ENT: Negative for sore throat Cardiovascular: Negative for chest pain Respiratory: As above Gastrointestinal: Negative for abdominal pain Genitourinary: Negative for dysuria. Musculoskeletal: Negative for back pain. Skin: Negative for rash.  Neurological: Negative for focal weakness Psychiatric: no anxiety    ____________________________________________   PHYSICAL EXAM:  VITAL SIGNS: ED Triage Vitals  Enc Vitals Group     BP 06/28/15 0850 116/65 mmHg     Pulse Rate 06/28/15 0850 110     Resp 06/28/15 0850 28     Temp 06/28/15 0850 99.3 F (37.4 C)     Temp Source 06/28/15 0850 Oral     SpO2 06/28/15 0850 88 %     Weight 06/28/15 0850 180 lb (81.647 kg)     Height 06/28/15 0850 '5\' 7"'$  (1.702 m)     Head Cir --      Peak Flow --      Pain Score 06/28/15 0850 6     Pain Loc --      Pain Edu? --      Excl. in [place]? --     Constitutional: Alert and oriented. No acute distress Eyes: Conjunctivae are normal. No erythema or  injection ENT   Head: Normocephalic and atraumatic.   Mouth/Throat: Mucous membranes are moist. Cardiovascular: Normal rate, regular rhythm. Normal and symmetric distal pulses are present in the upper extremities.  Respiratory: Tachypnea and mildly increased work of breathing. Wheezing bilaterally with decreased air flow Gastrointestinal: Soft and non-tender in all quadrants. No distention.  Genitourinary: deferred Musculoskeletal: Nontender with normal range of motion in all extremities. No lower extremity tenderness nor edema. Neurologic:  Normal speech and language. No gross focal neurologic deficits are appreciated. Skin:  Skin is warm, dry and intact. No rash noted.  ____________________________________________    LABS (pertinent positives/negatives)  Labs Reviewed  CBC - Abnormal; Notable for the following:    RDW 15.3 (*)    Platelets 143 (*)    All other components within normal limits  COMPREHENSIVE METABOLIC PANEL - Abnormal; Notable for the following:    Sodium 132 (*)    Potassium 3.2 (*)    Chloride 97 (*)    Glucose, Bld 120 (*)    Calcium 8.2 (*)    ALT 16 (*)    All other components within normal limits  CULTURE, BLOOD (ROUTINE X 2)  CULTURE, BLOOD (ROUTINE X 2)  TROPONIN I    ____________________________________________   EKG  ED ECG REPORT I, Lavonia Drafts, the attending physician, personally viewed and interpreted this ECG.  Date: 06/28/2015 EKG Time: 918 a.m. Rate: 97 Rhythm: normal sinus rhythm QRS Axis: normal Intervals: normal ST/T Wave abnormalities: Nonspecific Conduction Disturbances: none   ____________________________________________    RADIOLOGY  Chest x-ray shows new infiltrate  ____________________________________________   PROCEDURES  Procedure(s) performed: none  Critical Care performed: none  ____________________________________________   INITIAL IMPRESSION / ASSESSMENT AND PLAN / ED COURSE  Pertinent  labs & imaging results that were available during my care of the patient were reviewed by me and considered in my medical decision making (see chart for details).  Patient presents with likely COPD exacerbation. We will give Solu-Medrol 125 IV, nebulized albuterol and reevaluate.  ----------------------------------------- 9:41 AM on 06/28/2015 -----------------------------------------  Patient with new infiltrate on x-ray, I will add blood cultures, Rocephin and azithromycin. ____________________________________________   FINAL CLINICAL IMPRESSION(S) / ED DIAGNOSES  Final diagnoses:  COPD with acute exacerbation ([place])  Community acquired pneumonia          Lavonia Drafts, MD 06/28/15 1059

## 2015-06-28 NOTE — Progress Notes (Signed)
Called received from lab regarding pt's flu pcr results- positive for flu B Spoke with Dr.Mody regarding positive flu B. No new orders received.

## 2015-06-28 NOTE — H&P (Addendum)
[facility] [place] NAME: Ruben [name]    MR#:  [id number]  DATE OF BIRTH:  1955/04/16  DATE OF ADMISSION:  06/28/2015  PRIMARY CARE PHYSICIAN: Marden Noble, MD   REQUESTING/REFERRING PHYSICIAN: Dr Corky Downs  CHIEF COMPLAINT:  Shortness of breath HISTORY OF PRESENT ILLNESS:  Ruben [name]  is a 60 y.o. male with a known history of COPD and depression who presents with above complaint. Patient reports over the past 4 days he has had shortness of breath, dyspnea exertion and wheezing. He is also had a fever and cough. When I was in the room patient is also complaining of excruciating right upper quadrant abdominal pain. Chest x-ray does show pneumonia on the right side. He also has developed nausea and vomiting.   He is started on Rocephin and azithromycin and given IV steroids in the emergency room. PAST MEDICAL HISTORY:   Past Medical History  Diagnosis Date  . COPD (chronic obstructive pulmonary disease) ([place])   . Depression     PAST SURGICAL HISTORY:   Past Surgical History  Procedure Laterality Date  . Elbow surgery Left   . Wrist surgery Left     SOCIAL HISTORY:   Social History  Substance Use Topics  . Smoking status: Current Every Day Smoker -- 2.00 packs/day for 43 years    Types: Cigarettes  . Smokeless tobacco: Not on file  . Alcohol Use: No    FAMILY HISTORY:   Family History  Problem Relation Age of Onset  . Heart disease    . Hypertension    . Diabetes      DRUG ALLERGIES:  No Known Allergies   REVIEW OF SYSTEMS:  CONSTITUTIONAL: ++fever, fatigue and generalized weakness.  EYES: No blurred or double vision.  EARS, NOSE, AND THROAT: No tinnitus or ear pain.  RESPIRATORY: ++ cough, shortness of breath, wheezing NO hemoptysis.  CARDIOVASCULAR: No chest pain, orthopnea, edema.  GASTROINTESTINAL: ++ nausea, vomiting, NOdiarrhea ++RUQ abdominal pain.  GENITOURINARY: No dysuria, hematuria.   ENDOCRINE: No polyuria, nocturia,  HEMATOLOGY: No anemia, easy bruising or bleeding SKIN: No rash or lesion. MUSCULOSKELETAL: No joint pain or arthritis.   NEUROLOGIC: No tingling, numbness, weakness.  PSYCHIATRY: No anxiety ++ depression.   MEDICATIONS AT HOME:   Prior to Admission medications   Medication Sig Start Date End Date Taking? Authorizing Provider  albuterol (PROVENTIL HFA;VENTOLIN HFA) 108 (90 BASE) MCG/ACT inhaler Inhale 2 puffs into the lungs every 6 (six) hours as needed for wheezing or shortness of breath.    Marden Noble, MD  azithromycin (ZITHROMAX) 250 MG tablet Take 1 tablet (250 mg total) by mouth daily. 09/26/14   Bettey Costa, MD  clonazePAM (KLONOPIN) 0.5 MG tablet Take 0.5 mg by mouth 2 (two) times daily as needed for anxiety.    Marden Noble, MD  methylPREDNISolone (MEDROL) 4 MG tablet Take 1 tablet (4 mg total) by mouth daily. MEDROL DOS PAK 4 mg tabs taper 6 days 09/26/14   Bettey Costa, MD  mirtazapine (REMERON) 30 MG tablet Take 1 tablet (30 mg total) by mouth at bedtime. 09/26/14   Bettey Costa, MD  mirtazapine (REMERON) 45 MG tablet Take 1 tablet (45 mg total) by mouth at bedtime. 09/26/14   Bettey Costa, MD  mometasone-formoterol (DULERA) 100-5 MCG/ACT AERO Inhale 2 puffs into the lungs 2 (two) times daily. 09/26/14   Ruben Obst, MD  nicotine (NICODERM CQ - DOSED IN MG/24 HOURS) 21 mg/24hr  patch Place 1 patch (21 mg total) onto the skin daily. 09/26/14   Bettey Costa, MD  omeprazole (PRILOSEC) 10 MG capsule Take 10 mg by mouth daily.    Marden Noble, MD  simvastatin (ZOCOR) 10 MG tablet Take 10 mg by mouth daily.    Marden Noble, MD      VITAL SIGNS:  Blood pressure 135/77, pulse 99, temperature 99.3 F (37.4 C), temperature source Oral, resp. rate 19, height '5\' 7"'$  (1.702 m), weight 81.647 kg (180 lb), SpO2 95 %.  PHYSICAL EXAMINATION:  GENERAL:  60 y.o.-year-old patient lying in the bed with Mild acute distress from abdominal pain.  EYES: Pupils equal,  round, reactive to light and accommodation. No scleral icterus. Extraocular muscles intact.  HEENT: Head atraumatic, normocephalic. Oropharynx and nasopharynx clear.  NECK:  Supple, no jugular venous distention. No thyroid enlargement, no tenderness.  LUNGS: Diffuse expiratory wheezing without crackles or rales. No increase work of breathing.  CARDIOVASCULAR: S1, S2 normal. No murmurs, rubs, or gallops.  ABDOMEN: Bowel sounds positive. Mild tenderness on the right upper quadrant without rebound or guarding.  EXTREMITIES: No pedal edema, cyanosis, or clubbing.  NEUROLOGIC: Cranial nerves II through XII are grossly intact. No focal deficits. PSYCHIATRIC: The patient is alert and oriented x 3.  SKIN: No obvious rash, lesion, or ulcer.   LABORATORY PANEL:   CBC  Recent Labs Lab 06/28/15 0917  WBC 8.8  HGB 14.1  HCT 40.6  PLT 143*   ------------------------------------------------------------------------------------------------------------------  Chemistries   Recent Labs Lab 06/28/15 0917  NA 132*  K 3.2*  CL 97*  CO2 26  GLUCOSE 120*  BUN 13  CREATININE 0.97  CALCIUM 8.2*  AST 23  ALT 16*  ALKPHOS 50  BILITOT 0.5   ------------------------------------------------------------------------------------------------------------------  Cardiac Enzymes  Recent Labs Lab 06/28/15 0917  TROPONINI <0.03   ------------------------------------------------------------------------------------------------------------------  RADIOLOGY:  Dg Chest Portable 1 View  06/28/2015  CLINICAL DATA:  Shortness of breath. EXAM: PORTABLE CHEST 1 VIEW COMPARISON:  September 25, 2014 FINDINGS: There is new infiltrate in the right lung base.  No other changes. IMPRESSION: New right basilar infiltrate.  Recommend follow-up to resolution. Electronically Signed   By: Dorise Bullion III M.D   On: 06/28/2015 09:31    EKG:   Sinus rhythm no ST elevation or depression  IMPRESSION AND PLAN:     60 year old male with a history of COPD not on oxygen, tobacco abuse and depression who presents with COPD exacerbation and abdominal pain.  1. Acute hypoxic respiratory failure: This is secondary to acute COPD exacerbation. Plan as outlined below.  2. Acute COPD exacerbation: Patient will require IV steroids, DuoNeb's, inhalers and oxygen. Wean oxygen as tolerated.  Continue Levaquin for COPD and pneumonia.   3. Community-acquired pneumonia: Continue Levaquin and follow up on blood cultures  4. Abdominal pain, right upper quadrant: Likely secondary to area of pneumonia. Order right upper abdominal ultrasound. Continue supportive care with IV pain medications and IV fluids.  5. Depression: Continue outpatient medications.  6. Tobacco dependence: Patient strongly encouraged to stop smoking. Patient is in a contemplative state. Patient's counseled 3 minutes.  7. Hypokalemia: Potassium will be repleted and check an a.m.  All the records are reviewed and case discussed with ED provider. Management plans discussed with the patient and he is in agreement.  CODE STATUS: DNR  TOTAL TIME TAKING CARE OF THIS PATIENT: 50 minutes.    Ruben [name] M.D on 06/28/2015 at 11:10 AM  Between 7am  to 6pm - Pager - [phone] After 6pm go to [email or web address] - password [email or web address] [facility]  Office  [phone]  CC: Primary care physician; Marden Noble, MD

## 2015-06-28 NOTE — Progress Notes (Signed)
Patient assisted to bathroom per request- patient got short of breath, Resp Therapist called for treatment.

## 2015-06-28 NOTE — ED Notes (Signed)
Transported to [place].

## 2015-06-28 NOTE — ED Notes (Signed)
Pt began with [facility] last night.  Has COPD.  Labored and dyspnea at rest in triage.  Has had a cough but denies fevers.

## 2015-06-29 LAB — COMPREHENSIVE METABOLIC PANEL
ALBUMIN: 3.2 g/dL — AB (ref 3.5–5.0)
ALK PHOS: 50 U/L (ref 38–126)
ALT: 16 U/L — ABNORMAL LOW (ref 17–63)
ANION GAP: 6 (ref 5–15)
AST: 24 U/L (ref 15–41)
BUN: 16 mg/dL (ref 6–20)
CALCIUM: 8.7 mg/dL — AB (ref 8.9–10.3)
CO2: 27 mmol/L (ref 22–32)
Chloride: 104 mmol/L (ref 101–111)
Creatinine, Ser: 0.83 mg/dL (ref 0.61–1.24)
GFR calc Af Amer: >60 mL/min (ref >60)
GFR calc non Af Amer: >60 mL/min (ref >60)
GLUCOSE: 138 mg/dL — AB (ref 65–99)
POTASSIUM: 4 mmol/L (ref 3.5–5.1)
SODIUM: 137 mmol/L (ref 135–145)
Total Bilirubin: 0.5 mg/dL (ref 0.3–1.2)
Total Protein: 6.4 g/dL — ABNORMAL LOW (ref 6.5–8.1)

## 2015-06-29 MED ORDER — METHYLPREDNISOLONE SODIUM SUCC 125 MG IJ SOLR
60.0000 mg | Freq: Every day | INTRAMUSCULAR | Status: DC
  Administered 2015-06-30 – 2015-07-01 (×2): 60 mg via INTRAVENOUS
  Filled 2015-06-29 (×2): qty 2

## 2015-06-29 NOTE — Evaluation (Signed)
Physical Therapy Evaluation Patient Details Name: Ruben [name] MRN: [id number] DOB: [birth year] Today's Date: 06/29/2015   History of Present Illness  Ruben [name] is a 60yo white male who comes to [facility] after SOB, DOE, and RUQ pain. Pt with PNA and FluB positive. PMH: COPD, depression. Pt subsequently with N/V. At baseline pt performs limited community distance AMB with DOE, not on O2 at home.   Clinical Impression  Pt received up in chair, 2L/min O2 donned, pleasant amicable and agreeable to participate. MMT reveals 5/5 strength globally. Functional strength as demonstrated with 5xSTS demonstrates mild impairment (14s). Balance is modified indep, all mobility performed at supervision without LOB, and pt asserts that strength and balance are at baseline. AMB in room (136f) on room air demonstrates desaturation to 86%, diaphoresis, tachypnea, and heightened anxiety. Pt denies CP, recovers to >88% on '@L'$  in 338s   Pt demonstrating impairment of O2 perfusion limiting activity tolerance, indep in ADL, and functional mobility within the home and community. Pt will benefit from skilled PT intervention to address the above deficits in order to restore patient to PLOF and improve safety for return to home.       Follow Up Recommendations No PT follow up    Equipment Recommendations  None recommended by PT    Recommendations for Other Services       Precautions / Restrictions Precautions Precautions: None      Mobility  Bed Mobility               General bed mobility comments: Received up in chair.   Transfers Overall transfer level: Modified independent               General transfer comment: 5x STS in 16.5s on RA c SaO2: 89% (hands free)   Ambulation/Gait Ambulation/Gait assistance: Modified independent (Device/Increase time) Ambulation Distance (Feet): 120 Feet Assistive device: None     Gait velocity interpretation: <1.8 ft/sec, indicative of risk for recurrent  falls General Gait Details: very slow, remains steady, SaO2 drops to 86%, tremulation increases, and pt become diaphoretic. Pt denies CP, returned to 2L c recovery to >89% p 30s.   Stairs            Wheelchair Mobility    Modified Rankin (Stroke Patients Only)       Balance Overall balance assessment: No apparent balance deficits (not formally assessed)                                           Pertinent Vitals/Pain Pain Assessment: No/denies pain    Home Living Family/patient expects to be discharged to:: Private residence Living Arrangements: Spouse/significant other;Children Available Help at Discharge: Family Type of Home: Mobile home Home Access: Stairs to enter Entrance Stairs-Rails: None Entrance Stairs-Number of Steps: 6 Home Layout: One level        Prior Function Level of Independence: Independent         Comments: At baseline functional capacity and activity tolerance are both moderately limited by chronic DOE.      Hand Dominance        Extremity/Trunk Assessment   Upper Extremity Assessment: Overall WFL for tasks assessed           Lower Extremity Assessment: Overall WFL for tasks assessed         Communication      Cognition Arousal/Alertness: Awake/alert Behavior During  Therapy: WFL for tasks assessed/performed;Anxious Overall Cognitive Status: Within Functional Limits for tasks assessed                      General Comments      Exercises        Assessment/Plan    PT Assessment Patient needs continued PT services  PT Diagnosis Other (comment) (DOE R06.09)   PT Problem List Decreased mobility;Cardiopulmonary status limiting activity;Decreased activity tolerance  PT Treatment Interventions Functional mobility training;Therapeutic activities   PT Goals (Current goals can be found in the Care Plan section) Acute Rehab PT Goals Patient Stated Goal: improve ability to breath, regain functional  activity tolerance.  PT Goal Formulation: With patient Time For Goal Achievement: 07/13/15 Potential to Achieve Goals: Good    Frequency Min 3X/week   Barriers to discharge Inaccessible home environment      Co-evaluation               End of Session Equipment Utilized During Treatment: Oxygen Activity Tolerance: Patient tolerated treatment well;Treatment limited secondary to medical complications (Comment) Patient left: in chair;with chair alarm set;with call bell/phone within reach Nurse Communication: Mobility status;Other (comment)         Time: 3968-8648 PT Time Calculation (min) (ACUTE ONLY): 12 min   Charges:   PT Evaluation $PT Eval Moderate Complexity: 1 Procedure PT Treatments $Therapeutic Activity: 8-22 mins   PT G Codes:       2:31 PM, July 14, 2015 Etta Grandchild, PT, DPT PRN Physical Therapist - [place] License # [id number] [phone] (mobile)

## 2015-06-29 NOTE — Progress Notes (Signed)
Patient assisted to the chair. Advised to call for nurse with needs. Phone/call light within reach.

## 2015-06-29 NOTE — Progress Notes (Addendum)
Patient ID: Ruben [name], male   DOB: [birth year], 60 y.o.   MRN: [id number] [facility] [place] NAME: Ruben [name]    MR#:  [id number]  DATE OF BIRTH:  February 29, 1956  SUBJECTIVE:  Feels weak and has sob with cough  REVIEW OF SYSTEMS:   Review of Systems  Constitutional: Negative for fever, chills and weight loss.  HENT: Negative for ear discharge, ear pain and nosebleeds.   Eyes: Negative for blurred vision, pain and discharge.  Respiratory: Positive for cough and shortness of breath. Negative for sputum production, wheezing and stridor.   Cardiovascular: Negative for chest pain, palpitations, orthopnea and PND.  Gastrointestinal: Negative for nausea, vomiting, abdominal pain and diarrhea.  Genitourinary: Negative for urgency and frequency.  Musculoskeletal: Negative for back pain and joint pain.  Neurological: Positive for weakness. Negative for sensory change, speech change and focal weakness.  Psychiatric/Behavioral: Negative for depression and hallucinations. The patient is not nervous/anxious.   All other systems reviewed and are negative.  Tolerating Diet:yes Tolerating PT: no PT needs  DRUG ALLERGIES:  No Known Allergies  VITALS:  Blood pressure 133/69, pulse 82, temperature 97.5 F (36.4 C), temperature source Oral, resp. rate 18, height '5\' 7"'$  (1.702 m), weight 81.647 kg (180 lb), SpO2 96 %.  PHYSICAL EXAMINATION:   Physical Exam  GENERAL:  60 y.o.-year-old patient lying in the bed with no acute distress.  EYES: Pupils equal, round, reactive to light and accommodation. No scleral icterus. Extraocular muscles intact.  HEENT: Head atraumatic, normocephalic. Oropharynx and nasopharynx clear.  NECK:  Supple, no jugular venous distention. No thyroid enlargement, no tenderness.  LUNGS: Normal breath sounds bilaterally, no wheezing, rales, rhonchi. No use of accessory muscles of respiration.  CARDIOVASCULAR: S1, S2 normal.  No murmurs, rubs, or gallops.  ABDOMEN: Soft, nontender, nondistended. Bowel sounds present. No organomegaly or mass.  EXTREMITIES: No cyanosis, clubbing or edema b/l.    NEUROLOGIC: Cranial nerves II through XII are intact. No focal Motor or sensory deficits b/l.   PSYCHIATRIC:  patient is alert and oriented x 3.  SKIN: No obvious rash, lesion, or ulcer.   LABORATORY PANEL:  CBC  Recent Labs Lab 06/28/15 0917  WBC 8.8  HGB 14.1  HCT 40.6  PLT 143*    Chemistries   Recent Labs Lab 06/29/15 0313  NA 137  K 4.0  CL 104  CO2 27  GLUCOSE 138*  BUN 16  CREATININE 0.83  CALCIUM 8.7*  AST 24  ALT 16*  ALKPHOS 50  BILITOT 0.5   Cardiac Enzymes  Recent Labs Lab 06/28/15 0917  TROPONINI <0.03   RADIOLOGY:  Dg Chest Portable 1 View  06/28/2015  CLINICAL DATA:  Shortness of breath. EXAM: PORTABLE CHEST 1 VIEW COMPARISON:  September 25, 2014 FINDINGS: There is new infiltrate in the right lung base.  No other changes. IMPRESSION: New right basilar infiltrate.  Recommend follow-up to resolution. Electronically Signed   By: Dorise Bullion III M.D   On: 06/28/2015 09:31   US Abdomen Limited Ruq  06/28/2015  CLINICAL DATA:  Upper abdominal pain for the past 2 days. EXAM: US ABDOMEN LIMITED - RIGHT UPPER QUADRANT COMPARISON:  07/08/2012. FINDINGS: Gallbladder: Several small gallstones in the gallbladder measuring up to 7 mm in maximum diameter each. No gallbladder wall thickening or pericholecystic fluid. The patient was not tender over the gallbladder. Common bile duct: Diameter: 2.6 mm Liver: No focal lesion identified. Within normal limits in parenchymal  echogenicity. Other: Borderline echogenic right kidney. IMPRESSION: Cholelithiasis without evidence of cholecystitis. Electronically Signed   By: Claudie Revering M.D.   On: 06/28/2015 12:44   ASSESSMENT AND PLAN:  60 year old male with a history of COPD not on oxygen, tobacco abuse and depression who presents with COPD exacerbation and  abdominal pain.  1. Acute hypoxic respiratory failure: This is secondary to acute COPD exacerbation. Plan as outlined below.  2. Acute COPD exacerbation/community acquired pneumonia  Continue IV steroids, DuoNeb's, inhalers and oxygen. Wean oxygen as tolerated.  Continue Levaquin for COPD and pneumonia.  3. Influenza B positive  By mouth tamiflu  4. Abdominal pain, right upper quadrant: Likely secondary to area of pneumonia worse his gallstone related LFTs within normal limits. Patient does not have any more abdominal pain. He is requesting regular diet. Continue supportive care with IV pain medications Patient informed of gallstones  5. Depression: Continue outpatient medications.  6. Tobacco dependence: Patient strongly encouraged to stop smoking. Patient is in a contemplative state. Patient's counseled 3 minutes.  7. Hypokalemia: Potassium will be repleted  If all remains stable. We'll send patient home tomorrow Assessment home oxygen need. Case discussed with Care Management/Social Worker. Management plans discussed with the patient, family and they are in agreement.  CODE STATUS:Full DVT Prophylaxis:  Lovenox TOTAL TIME TAKING CARE OF THIS PATIENT:  30 minutes.  >50% time spent on counselling and coordination of care  POSSIBLE D/C IN  one DAYS, DEPENDING ON CLINICAL CONDITION.  Note: This dictation was prepared with Dragon dictation along with smaller phrase technology. Any transcriptional errors that result from this process are unintentional.  Sharina Petre M.D on 06/29/2015 at 1:25 PM  Between 7am to 6pm - Pager - [phone]  After 6pm go to [email or web address] - password [email or web address] [facility]  Office  [phone]  CC: Primary care physician; Marden Noble, MD

## 2015-06-29 NOTE — Plan of Care (Signed)
Problem: Acute Rehab PT Goals(only PT should resolve) Goal: Patient Will Transfer Sit To/From Stand Pt will perform transfer sit <>stand 5x hands free in less than 11s to demonstrated improved functional strength.  Goal: Pt Will Ambulate Pt will ambulate indep at a self selected gait speed for a distance greater than 319f and with terminal SaO2> 88% to demonstrate the ability to perform safe household distance ambulation at discharge.     Goal: Pt Will Go Up/Down Stairs Pt will ascend/descend 10 stairs indep to demonstrate safe entry/exit of home.

## 2015-06-30 LAB — BLOOD CULTURE ID PANEL (REFLEXED)
Acinetobacter baumannii: NOT DETECTED
CANDIDA ALBICANS: NOT DETECTED
CANDIDA GLABRATA: NOT DETECTED
CANDIDA PARAPSILOSIS: NOT DETECTED
CANDIDA TROPICALIS: NOT DETECTED
Candida krusei: NOT DETECTED
Carbapenem resistance: NOT DETECTED
ENTEROBACTER CLOACAE COMPLEX: NOT DETECTED
ESCHERICHIA COLI: NOT DETECTED
Enterobacteriaceae species: NOT DETECTED
Enterococcus species: NOT DETECTED
Haemophilus influenzae: NOT DETECTED
KLEBSIELLA OXYTOCA: NOT DETECTED
KLEBSIELLA PNEUMONIAE: NOT DETECTED
Listeria monocytogenes: NOT DETECTED
METHICILLIN RESISTANCE: NOT DETECTED
Neisseria meningitidis: NOT DETECTED
PROTEUS SPECIES: NOT DETECTED
Pseudomonas aeruginosa: NOT DETECTED
STREPTOCOCCUS PNEUMONIAE: NOT DETECTED
Serratia marcescens: NOT DETECTED
Staphylococcus aureus (BCID): NOT DETECTED
Staphylococcus species: NOT DETECTED
Streptococcus agalactiae: NOT DETECTED
Streptococcus pyogenes: NOT DETECTED
Streptococcus species: NOT DETECTED
Vancomycin resistance: NOT DETECTED

## 2015-06-30 MED ORDER — SERTRALINE HCL 100 MG PO TABS
100.0000 mg | ORAL_TABLET | Freq: Every day | ORAL | Status: DC
  Administered 2015-06-30 – 2015-07-01 (×2): 100 mg via ORAL
  Filled 2015-06-30 (×2): qty 1

## 2015-06-30 MED ORDER — ESCITALOPRAM OXALATE 10 MG PO TABS
20.0000 mg | ORAL_TABLET | Freq: Every day | ORAL | Status: DC
  Administered 2015-06-30 – 2015-07-01 (×2): 20 mg via ORAL
  Filled 2015-06-30 (×2): qty 2

## 2015-06-30 MED ORDER — PANTOPRAZOLE SODIUM 40 MG PO TBEC: 40.0000 mg | DELAYED_RELEASE_TABLET | Freq: Every day | ORAL | Status: DC

## 2015-06-30 MED ORDER — IPRATROPIUM-ALBUTEROL 0.5-2.5 (3) MG/3ML IN SOLN
3.0000 mL | Freq: Four times a day (QID) | RESPIRATORY_TRACT | Status: DC | PRN
  Administered 2015-06-30: 3 mL via RESPIRATORY_TRACT
  Filled 2015-06-30: qty 3

## 2015-06-30 MED ORDER — TRAZODONE HCL 100 MG PO TABS: 100.0000 mg | ORAL_TABLET | Freq: Every day | ORAL | Status: DC

## 2015-06-30 MED ORDER — SODIUM CHLORIDE 0.9 % IV SOLN
2.0000 g | Freq: Four times a day (QID) | INTRAVENOUS | Status: DC
  Administered 2015-06-30 (×2): 2 g via INTRAVENOUS
  Filled 2015-06-30 (×4): qty 2000

## 2015-06-30 MED ORDER — LEVOFLOXACIN 750 MG PO TABS
750.0000 mg | ORAL_TABLET | Freq: Every day | ORAL | Status: DC
  Administered 2015-07-01: 750 mg via ORAL
  Filled 2015-06-30: qty 1

## 2015-06-30 MED ORDER — MIRTAZAPINE 15 MG PO TABS
15.0000 mg | ORAL_TABLET | Freq: Every day | ORAL | Status: DC
  Administered 2015-06-30: 15 mg via ORAL
  Filled 2015-06-30: qty 1

## 2015-06-30 MED ORDER — SIMVASTATIN 20 MG PO TABS
20.0000 mg | ORAL_TABLET | Freq: Every day | ORAL | Status: DC
  Administered 2015-06-30 – 2015-07-01 (×2): 20 mg via ORAL
  Filled 2015-06-30 (×3): qty 1

## 2015-06-30 MED ORDER — ALBUTEROL SULFATE HFA 108 (90 BASE) MCG/ACT IN AERS: 2.0000 | INHALATION_SPRAY | Freq: Four times a day (QID) | RESPIRATORY_TRACT | Status: DC | PRN

## 2015-06-30 MED ORDER — MOMETASONE FURO-FORMOTEROL FUM 200-5 MCG/ACT IN AERO
2.0000 | INHALATION_SPRAY | Freq: Two times a day (BID) | RESPIRATORY_TRACT | Status: DC
  Administered 2015-06-30 – 2015-07-01 (×3): 2 via RESPIRATORY_TRACT
  Filled 2015-06-30: qty 8.8

## 2015-06-30 MED ORDER — AMLODIPINE BESYLATE 10 MG PO TABS
10.0000 mg | ORAL_TABLET | Freq: Every day | ORAL | Status: DC
  Administered 2015-06-30 – 2015-07-01 (×2): 10 mg via ORAL
  Filled 2015-06-30 (×2): qty 1

## 2015-06-30 MED ORDER — CLONAZEPAM 0.5 MG PO TABS: 0.5000 mg | ORAL_TABLET | Freq: Two times a day (BID) | ORAL | Status: DC | PRN

## 2015-06-30 NOTE — Progress Notes (Signed)
Patient ID: Ruben [name], male   DOB: [birth year], 60 y.o.   MRN: [id number] [facility] [place] NAME: Ruben [name]    MR#:  [id number]  DATE OF BIRTH:  [birth year]  SUBJECTIVE:   Feels sob. Wants to change his meds REVIEW OF SYSTEMS:   Review of Systems  Constitutional: Negative for fever, chills and weight loss.  HENT: Negative for ear discharge, ear pain and nosebleeds.   Eyes: Negative for blurred vision, pain and discharge.  Respiratory: Positive for cough, sputum production and shortness of breath. Negative for wheezing and stridor.   Cardiovascular: Negative for chest pain, palpitations, orthopnea and PND.  Gastrointestinal: Negative for nausea, vomiting, abdominal pain and diarrhea.  Genitourinary: Negative for urgency and frequency.  Musculoskeletal: Negative for back pain and joint pain.  Neurological: Positive for weakness. Negative for sensory change, speech change and focal weakness.  Psychiatric/Behavioral: Negative for depression and hallucinations. The patient is not nervous/anxious.   All other systems reviewed and are negative.  Tolerating Diet:yes Tolerating PT: yes  DRUG ALLERGIES:  No Known Allergies  VITALS:  Blood pressure 131/70, pulse 87, temperature 98 F (36.7 C), temperature source Oral, resp. rate 18, height '5\' 7"'$  (1.702 m), weight 81.647 kg (180 lb), SpO2 92 %.  PHYSICAL EXAMINATION:   Physical Exam  GENERAL:  60 y.o.-year-old patient lying in the bed with no acute distress.  EYES: Pupils equal, round, reactive to light and accommodation. No scleral icterus. Extraocular muscles intact.  HEENT: Head atraumatic, normocephalic. Oropharynx and nasopharynx clear.  NECK:  Supple, no jugular venous distention. No thyroid enlargement, no tenderness.  LUNGS:distant breath sounds bilaterally, no wheezing, rales, rhonchi. No use of accessory muscles of respiration.  CARDIOVASCULAR: S1, S2 normal. No  murmurs, rubs, or gallops.  ABDOMEN: Soft, nontender, nondistended. Bowel sounds present. No organomegaly or mass.  EXTREMITIES: No cyanosis, clubbing or edema b/l.    NEUROLOGIC: Cranial nerves II through XII are intact. No focal Motor or sensory deficits b/l.   PSYCHIATRIC:  patient is alert and oriented x 3.  SKIN: No obvious rash, lesion, or ulcer.   LABORATORY PANEL:  CBC  Recent Labs Lab 06/28/15 0917  WBC 8.8  HGB 14.1  HCT 40.6  PLT 143*    Chemistries   Recent Labs Lab 06/29/15 0313  NA 137  K 4.0  CL 104  CO2 27  GLUCOSE 138*  BUN 16  CREATININE 0.83  CALCIUM 8.7*  AST 24  ALT 16*  ALKPHOS 50  BILITOT 0.5   Cardiac Enzymes  Recent Labs Lab 06/28/15 0917  TROPONINI <0.03   RADIOLOGY:  No results found. ASSESSMENT AND PLAN:  60 year old male with a history of COPD not on oxygen, tobacco abuse and depression who presents with COPD exacerbation and abdominal pain.  1. Acute hypoxic respiratory failure: This is secondary to acute COPD exacerbation. Plan as outlined below.  2. Acute COPD exacerbation/community acquired pneumonia  Continue po tapering steroids, DuoNeb's, inhalers and oxygen. Wean oxygen as tolerated.  Continue Levaquin for COPD and pneumonia.  3. Influenza B positive  By mouth tamiflu  4. Abdominal pain, right upper quadrant: Likely secondary to area of pneumonia worse his gallstone related LFTs within normal limits. Patient does not have any more abdominal pain -tolerating regular diet. Continue supportive care with IV pain medications Patient informed of gallstones  5. Depression: Continue outpatient medications.  6. Tobacco dependence: Patient strongly encouraged to stop smoking. Patient is in a  contemplative state. Patient's counseled 3 minutes.  7. Hypokalemia: Potassium will be repleted  If all remains stable. We'll send patient home tomorrow. i have recommended pt d/w pcp for change in his meds. Although have d/ced  his Trazodone and started on low dose Remeron.  Assessment for home oxygen need.  Case discussed with Care Management/Social Worker. Management plans discussed with the patient, family and they are in agreement.  CODE STATUS:Full DVT Prophylaxis:  Lovenox TOTAL TIME TAKING CARE OF THIS PATIENT:  30 minutes.  >50% time spent on counselling and coordination of care  POSSIBLE D/C IN  one DAYS, DEPENDING ON CLINICAL CONDITION.  Note: This dictation was prepared with Dragon dictation along with smaller phrase technology. Any transcriptional errors that result from this process are unintentional.  Nimrod Wendt M.D on 06/30/2015 at 6:34 PM  Between 7am to 6pm - Pager - [phone]  After 6pm go to [email or web address] - password [email or web address] [facility]  Office  [phone]  CC: Primary care physician; Marden Noble, MD

## 2015-06-30 NOTE — Care Management Note (Signed)
Case Management Note  Patient Details  Name: RASHAWD LASKARIS MRN: [id number] Date of Birth: [birth year]  Subjective/Objective:                  Met with patient to discuss discharge planning He sitting straight up in bed with purse-lipped breathing speaking two-three works with each breath. I requested a nebulizer treatment from [employer]. He states that he lives with his wife. His PCP is Dr. Brynda Greathouse. He denies difficulty paying for Rx however he states his PCP is not willing to try anything else. Apparently patient still smokes and as a result no other treatment may help patient. O2 is new. He states he has a nebulizer at home.   Action/Plan: RNCM to follow for home O2. Discussed with Dr. Fritzi Mandes patient concerns about "not changing his medications in over 7 years".   Expected Discharge Date:                  Expected Discharge Plan:     In-House Referral:     Discharge planning Services  CM Consult  Post Acute Care Choice:  Durable Medical Equipment Choice offered to:  Patient  DME Arranged:    DME Agency:     HH Arranged:    [place] Agency:     Status of Service:  In process, will continue to follow  Medicare Important Message Given:    Date Medicare IM Given:    Medicare IM give by:    Date Additional Medicare IM Given:    Additional Medicare Important Message give by:     If discussed at [place] of Stay Meetings, dates discussed:    Additional Comments:  Marshell Garfinkel, RN 06/30/2015, 9:24 AM

## 2015-06-30 NOTE — Progress Notes (Signed)
Pharmacy Antibiotic Follow-up Note  Ruben [name] is a 60 y.o. year-old male admitted on 06/28/2015.  The patient is currently on day 1 of levaquin for PNA.  Assessment/Plan: After discussion with Dr. Melynda Ripple, antibiotics will be changed to include Ampicillin 2 gm IV Q6H.   Pt is already on levaquin 750 mg IV Q24H which covers most gram positive species, MD wants to add ampicillin 2 gm IV Q6H since it is considered to be first line coverage for listeria.   Temp (24hrs), Avg:97.7 F (36.5 C), Min:97.5 F (36.4 C), Max:97.9 F (36.6 C)   Recent Labs Lab 06/28/15 0917  WBC 8.8    Recent Labs Lab 06/28/15 0917 06/29/15 0313  CREATININE 0.97 0.83   Estimated Creatinine Clearance: 98 mL/min (by C-G formula based on Cr of 0.83).    No Known Allergies  Antimicrobials this admission:  Levaquin 750 mg IV Q24H. Ampicillin 2 gm  IV Q6H .  Levels/dose changes this admission:   Microbiology results:  BCx: 3/20 -  Gram positive rods growing in 1 aerobic bottle   UCx:    Sputum:    MRSA PCR:   Thank you for allowing pharmacy to be a part of this patient's care.  Manual Navarra D PharmD 06/30/2015 2:57 AM

## 2015-07-01 MED ORDER — LEVOFLOXACIN 750 MG PO TABS: 750.0000 mg | ORAL_TABLET | Freq: Every day | ORAL | Status: DC

## 2015-07-01 MED ORDER — PREDNISONE 50 MG PO TABS: 50.0000 mg | ORAL_TABLET | Freq: Every day | ORAL | Status: DC

## 2015-07-01 MED ORDER — MIRTAZAPINE 15 MG PO TABS: 15.0000 mg | ORAL_TABLET | Freq: Every day | ORAL | Status: DC

## 2015-07-01 MED ORDER — PREDNISONE 50 MG PO TABS: ORAL_TABLET | ORAL | Status: DC

## 2015-07-01 MED ORDER — NICOTINE 21 MG/24HR TD PT24: 21.0000 mg | MEDICATED_PATCH | Freq: Every day | TRANSDERMAL | Status: DC

## 2015-07-01 MED ORDER — OSELTAMIVIR PHOSPHATE 75 MG PO CAPS: 75.0000 mg | ORAL_CAPSULE | Freq: Two times a day (BID) | ORAL | Status: DC

## 2015-07-01 NOTE — Discharge Summary (Signed)
Ruben [name] NAME: Ruben [name]    MR#:  [id number]  DATE OF BIRTH:  08-Sep-1955  DATE OF ADMISSION:  06/28/2015 ADMITTING PHYSICIAN: Bettey Costa, MD  DATE OF DISCHARGE: 07/01/2015  PRIMARY CARE PHYSICIAN: Marden Noble, MD    ADMISSION DIAGNOSIS:  Pain [R52] Community acquired pneumonia [J18.9] Upper abdominal pain [R10.10] COPD with acute exacerbation ([place]) [J44.1]  DISCHARGE DIAGNOSIS:  Influenza B Pneumonia Acute on chronic COPD exacerbation Depression/anxiety History of gallstones-asymptomatic  SECONDARY DIAGNOSIS:   Past Medical History  Diagnosis Date  . COPD (chronic obstructive pulmonary disease) ([place])   . Depression   . Stroke [facility])     HOSPITAL COURSE:   60 year old male with a history of COPD not on oxygen, tobacco abuse and depression who presents with COPD exacerbation and abdominal pain.  1. Acute hypoxic respiratory failure: This is secondary to acute COPD exacerbation. Plan as outlined below.  2. Acute COPD exacerbation/community acquired pneumonia  Continue po tapering steroids, DuoNeb's, inhalers and oxygen. Wean oxygen as tolerated. We will assess for home oxygen need. Patient had be set up and ambulate with physical therapy on 06/29/2015 suggesting he will likely qualify for home oxygen Continue Levaquin for COPD and pneumonia.  3. Influenza B positive  By mouth tamiflu for 5 days  4. Abdominal pain, right upper quadrant: Likely secondary to area of pneumonia worse his gallstone related LFTs within normal limits. Patient does not have any more abdominal pain -tolerating regular diet. Continue supportive care with IV pain medications Patient informed of gallstones  5. Depression: Continue outpatient medications.  6. Tobacco dependence: Patient strongly encouraged to stop smoking. Patient is in a contemplative state. Patient's counseled 3 minutes.  7. Hypokalemia: Potassium   Repleted.  Overall improving. Patient will be discharged to home. Physical therapy recommended to PT needs at home. CONSULTS OBTAINED:     DRUG ALLERGIES:  No Known Allergies  DISCHARGE MEDICATIONS:   Current Discharge Medication List    START taking these medications   Details  levofloxacin (LEVAQUIN) 750 MG tablet Take 1 tablet (750 mg total) by mouth daily. Qty: 4 tablet, Refills: 0    oseltamivir (TAMIFLU) 75 MG capsule Take 1 capsule (75 mg total) by mouth 2 (two) times daily. Qty: 6 capsule, Refills: 0    predniSONE (DELTASONE) 50 MG tablet Take 50 mg daily and tpaer by 10 mg daily then stop Qty: 15 tablet, Refills: 0      CONTINUE these medications which have CHANGED   Details  mirtazapine (REMERON) 15 MG tablet Take 1 tablet (15 mg total) by mouth at bedtime. Qty: 30 tablet, Refills: 0    nicotine (NICODERM CQ - DOSED IN MG/24 HOURS) 21 mg/24hr patch Place 1 patch (21 mg total) onto the skin daily. Qty: 28 patch, Refills: 0      CONTINUE these medications which have NOT CHANGED   Details  ADVAIR DISKUS 250-50 MCG/DOSE AEPB Inhale 1 puff into the lungs 2 (two) times daily. Refills: 0    !! albuterol (PROAIR HFA) 108 (90 Base) MCG/ACT inhaler Inhale 2 puffs into the lungs every 6 (six) hours as needed for wheezing or shortness of breath.    !! albuterol (PROVENTIL HFA;VENTOLIN HFA) 108 (90 BASE) MCG/ACT inhaler Inhale 2 puffs into the lungs every 6 (six) hours as needed for wheezing or shortness of breath.    amLODipine (NORVASC) 10 MG tablet Take 10 mg by mouth every morning. Refills: 0  clonazePAM (KLONOPIN) 0.5 MG tablet Take 0.5 mg by mouth 2 (two) times daily as needed. For anxiety Refills: 0    escitalopram (LEXAPRO) 20 MG tablet Take 20 mg by mouth every morning. Refills: 0    ipratropium-albuterol (DUONEB) 0.5-2.5 (3) MG/3ML SOLN Take 3 mLs by nebulization 4 (four) times daily as needed. For shortness of breath/wheezing. Refills: 0    omeprazole  (PRILOSEC) 20 MG capsule Take 20 mg by mouth every morning. Refills: 0    sertraline (ZOLOFT) 100 MG tablet Take 100 mg by mouth every morning. Refills: 0    simvastatin (ZOCOR) 20 MG tablet Take 20 mg by mouth every morning. Refills: 0    traZODone (DESYREL) 100 MG tablet Take 100 mg by mouth at bedtime. Refills: 0     !! - Potential duplicate medications found. Please discuss with provider.      If you experience worsening of your admission symptoms, develop shortness of breath, life threatening emergency, suicidal or homicidal thoughts you must seek medical attention immediately by calling 911 or calling your MD immediately  if symptoms less severe.  You Must read complete instructions/literature along with all the possible adverse reactions/side effects for all the Medicines you take and that have been prescribed to you. Take any new Medicines after you have completely understood and accept all the possible adverse reactions/side effects.   Please note  You were cared for by a hospitalist during your hospital stay. If you have any questions about your discharge medications or the care you received while you were in the hospital after you are discharged, you can call the unit and asked to speak with the hospitalist on call if the hospitalist that took care of you is not available. Once you are discharged, your primary care physician will handle any further medical issues. Please note that NO REFILLS for any discharge medications will be authorized once you are discharged, as it is imperative that you return to your primary care physician (or establish a relationship with a primary care physician if you do not have one) for your aftercare needs so that they can reassess your need for medications and monitor your lab values. Today   SUBJECTIVE   Feeling weak. No complaints  VITAL SIGNS:  Blood pressure 142/88, pulse 75, temperature 97.4 F (36.3 C), temperature source Oral, resp. rate  18, height '5\' 7"'$  (1.702 m), weight 81.647 kg (180 lb), SpO2 92 %.  I/O:   Intake/Output Summary (Last 24 hours) at 07/01/15 1201 Last data filed at 07/01/15 1118  Gross per 24 hour  Intake    720 ml  Output   1850 ml  Net  -1130 ml    PHYSICAL EXAMINATION:  GENERAL:  60 y.o.-year-old patient lying in the bed with no acute distress. Appears chronically ill EYES: Pupils equal, round, reactive to light and accommodation. No scleral icterus. Extraocular muscles intact.  HEENT: Head atraumatic, normocephalic. Oropharynx and nasopharynx clear.  NECK:  Supple, no jugular venous distention. No thyroid enlargement, no tenderness.  LUNGS: distant breath sounds bilaterally, no wheezing, rales,rhonchi or crepitation. No use of accessory muscles of respiration.  CARDIOVASCULAR: S1, S2 normal. No murmurs, rubs, or gallops.  ABDOMEN: Soft, non-tender, non-distended. Bowel sounds present. No organomegaly or mass.  EXTREMITIES: No pedal edema, cyanosis, or clubbing.  NEUROLOGIC: Cranial nerves II through XII are intact. Muscle strength 5/5 in all extremities. Sensation intact. Gait not checked.  PSYCHIATRIC: The patient is alert and oriented x 3.  SKIN: No obvious rash,  lesion, or ulcer.   DATA REVIEW:   CBC   Recent Labs Lab 06/28/15 0917  WBC 8.8  HGB 14.1  HCT 40.6  PLT 143*    Chemistries   Recent Labs Lab 06/29/15 0313  NA 137  K 4.0  CL 104  CO2 27  GLUCOSE 138*  BUN 16  CREATININE 0.83  CALCIUM 8.7*  AST 24  ALT 16*  ALKPHOS 50  BILITOT 0.5    Microbiology Results   Recent Results (from the past 240 hour(s))  Blood culture (routine x 2)     Status: None (Preliminary result)   Collection Time: 06/28/15 10:10 AM  Result Value Ref Range Status   Specimen Description BLOOD RIGHT ASSIST CONTROL  Final   Special Requests BOTTLES DRAWN AEROBIC AND ANAEROBIC 3 CC  Final   Culture  Setup Time   Final    GRAM POSITIVE RODS AEROBIC BOTTLE ONLY CRITICAL RESULT CALLED TO,  READ BACK BY AND VERIFIED WITH: JASON ROBBINS @ 0258 ON 06/30/2015 BY [facility] CONFIRMED BY [facility]    Culture GRAM POSITIVE RODS AEROBIC BOTTLE ONLY   Final   Report Status PENDING  Incomplete  Blood Culture ID Panel (Reflexed)     Status: None   Collection Time: 06/28/15 10:10 AM  Result Value Ref Range Status   Enterococcus species NOT DETECTED NOT DETECTED Final   Vancomycin resistance NOT DETECTED NOT DETECTED Final   Listeria monocytogenes NOT DETECTED NOT DETECTED Final   Staphylococcus species NOT DETECTED NOT DETECTED Final   Staphylococcus aureus NOT DETECTED NOT DETECTED Final   Methicillin resistance NOT DETECTED NOT DETECTED Final   Streptococcus species NOT DETECTED NOT DETECTED Final   Streptococcus agalactiae NOT DETECTED NOT DETECTED Final   Streptococcus pneumoniae NOT DETECTED NOT DETECTED Final   Streptococcus pyogenes NOT DETECTED NOT DETECTED Final   Acinetobacter baumannii NOT DETECTED NOT DETECTED Final   Enterobacteriaceae species NOT DETECTED NOT DETECTED Final   Enterobacter cloacae complex NOT DETECTED NOT DETECTED Final   Escherichia coli NOT DETECTED NOT DETECTED Final   Klebsiella oxytoca NOT DETECTED NOT DETECTED Final   Klebsiella pneumoniae NOT DETECTED NOT DETECTED Final   Proteus species NOT DETECTED NOT DETECTED Final   Serratia marcescens NOT DETECTED NOT DETECTED Final   Carbapenem resistance NOT DETECTED NOT DETECTED Final   Haemophilus influenzae NOT DETECTED NOT DETECTED Final   Neisseria meningitidis NOT DETECTED NOT DETECTED Final   Pseudomonas aeruginosa NOT DETECTED NOT DETECTED Final   Candida albicans NOT DETECTED NOT DETECTED Final   Candida glabrata NOT DETECTED NOT DETECTED Final   Candida krusei NOT DETECTED NOT DETECTED Final   Candida parapsilosis NOT DETECTED NOT DETECTED Final   Candida tropicalis NOT DETECTED NOT DETECTED Final  Blood culture (routine x 2)     Status: None (Preliminary result)   Collection Time: 06/28/15 10:15 AM   Result Value Ref Range Status   Specimen Description BLOOD RIGHT HAND  Final   Special Requests BOTTLES DRAWN AEROBIC AND ANAEROBIC 5 CC  Final   Culture NO GROWTH 3 DAYS  Final   Report Status PENDING  Incomplete  Rapid Influenza A&B Antigens ([place] only)     Status: None   Collection Time: 06/28/15 11:16 AM  Result Value Ref Range Status   Influenza A ([facility]) NEGATIVE NEGATIVE Final   Influenza B ([facility]) NEGATIVE NEGATIVE Final    RADIOLOGY:  No results found.   Management plans discussed with the patient, family and they are in agreement.  CODE STATUS:     Code Status Orders        Start     Ordered   06/28/15 1310  Full code   Continuous     06/28/15 1309    Code Status History    Date Active Date Inactive Code Status Order ID Comments User Context   06/28/2015 11:09 AM 06/28/2015 11:15 AM DNR [id number]  Bettey Costa, MD ED   09/25/2014  4:34 AM 09/26/2014  2:41 PM DNR [id number]  Lance Coon, MD Inpatient      TOTAL TIME TAKING CARE OF THIS PATIENT: 40 minutes.    Saban Heinlen M.D on 07/01/2015 at 12:01 PM  Between 7am to 6pm - Pager - [phone] After 6pm go to [email or web address] - password [email or web address] [facility]  Office  [phone]  CC: Primary care physician; Marden Noble, MD

## 2015-07-01 NOTE — Care Management (Signed)
Home O2 requested from Will with [facility]. Tank will be delivered to this room.  No other RNCM needs.

## 2015-07-01 NOTE — Progress Notes (Signed)
PT Cancellation Note  Patient Details Name: Ruben [name] MRN: [id number] DOB: Jan 23, 1956   Cancelled Treatment:    Reason Eval/Treat Not Completed: Patient declined, no reason specified. Chart reviewed and pt discussed pt with RN. Upon PT arrival pt declines therapy at this time due to feeling very ill and weak. He says he will try at another time. PT will f/u at a later time and resume therapy when appropriate.   Neoma Laming, PT, DPT  07/01/2015, 11:27 AM [phone]

## 2015-07-01 NOTE — Progress Notes (Signed)
DISCHARGE NOTE:  Pt given discharge instructions , pt verbalized understanding. Pt notified that meds were sent to pharmacy, pt will pick up. Pt d/c w home O2 tank on 2 L. Pt wheeled to car by staff.

## 2015-07-01 NOTE — Progress Notes (Signed)
SATURATION QUALIFICATIONS:   Patient Saturations on Room Air at Rest =90 %  Patient Saturations on Room Air while Ambulating =88 %  Patient Saturations on 2 Liters of oxygen while Ambulating =94 %  Please briefly explain why patient needs home oxygen: COPD

## 2015-07-01 NOTE — Discharge Instructions (Signed)
ues your oxygen as before

## 2015-07-01 NOTE — Plan of Care (Signed)
Problem: Respiratory: Goal: Levels of oxygenation will improve Outcome: Progressing Still remaining on 1L of oxygen  Problem: Pain Managment: Goal: General experience of comfort will improve Outcome: Progressing No complaints of pain this shift.  Problem: Skin Integrity: Goal: Risk for impaired skin integrity will decrease Outcome: Adequate for Discharge Skin remaining intact.  Problem: Nutrition: Goal: Adequate nutrition will be maintained Outcome: Completed/Met Date Met:  07/01/15 Eating and drinking without difficulty.

## 2015-07-01 NOTE — Progress Notes (Signed)
   07/01/15 0916  Clinical Encounter Type  Visited With Patient  Visit Type Initial  Consult/Referral To Chaplain  Spiritual Encounters  Spiritual Needs Prayer  Stress Factors  Patient Stress Factors Health changes;Financial concerns  Chaplain rounded in unit and offered pastoral care. Patient would benefit from continued spiritual support. I will follow up with patient daily for the remaining of his stay.   [place] [phone]

## 2015-07-03 LAB — CULTURE, BLOOD (ROUTINE X 2): CULTURE: NO GROWTH

## 2016-06-23 ENCOUNTER — Inpatient Hospital Stay: Payer: Medicaid Other

## 2016-06-23 ENCOUNTER — Emergency Department: Payer: Medicaid Other

## 2016-06-23 ENCOUNTER — Encounter: Payer: Self-pay | Admitting: Emergency Medicine

## 2016-06-23 ENCOUNTER — Inpatient Hospital Stay
Admission: EM | Admit: 2016-06-23 | Discharge: 2016-06-26 | DRG: 190 | Disposition: A | Discharge status: Home / Self Care | Payer: Medicaid Other | Attending: Internal Medicine | Admitting: Internal Medicine

## 2016-06-23 DIAGNOSIS — F17200 Nicotine dependence, unspecified, uncomplicated: Secondary | ICD-10-CM | POA: Diagnosis present

## 2016-06-23 DIAGNOSIS — F329 Major depressive disorder, single episode, unspecified: Secondary | ICD-10-CM | POA: Diagnosis present

## 2016-06-23 DIAGNOSIS — I10 Essential (primary) hypertension: Secondary | ICD-10-CM | POA: Diagnosis present

## 2016-06-23 DIAGNOSIS — Z66 Do not resuscitate: Secondary | ICD-10-CM | POA: Diagnosis present

## 2016-06-23 DIAGNOSIS — R0602 Shortness of breath: Secondary | ICD-10-CM | POA: Diagnosis not present

## 2016-06-23 DIAGNOSIS — J440 Chronic obstructive pulmonary disease with acute lower respiratory infection: Principal | ICD-10-CM | POA: Diagnosis present

## 2016-06-23 DIAGNOSIS — Z8673 Personal history of transient ischemic attack (TIA), and cerebral infarction without residual deficits: Secondary | ICD-10-CM

## 2016-06-23 DIAGNOSIS — F17210 Nicotine dependence, cigarettes, uncomplicated: Secondary | ICD-10-CM | POA: Diagnosis present

## 2016-06-23 DIAGNOSIS — J189 Pneumonia, unspecified organism: Secondary | ICD-10-CM | POA: Diagnosis present

## 2016-06-23 DIAGNOSIS — K219 Gastro-esophageal reflux disease without esophagitis: Secondary | ICD-10-CM | POA: Diagnosis present

## 2016-06-23 DIAGNOSIS — J441 Chronic obstructive pulmonary disease with (acute) exacerbation: Secondary | ICD-10-CM | POA: Diagnosis present

## 2016-06-23 DIAGNOSIS — F419 Anxiety disorder, unspecified: Secondary | ICD-10-CM | POA: Diagnosis present

## 2016-06-23 DIAGNOSIS — R918 Other nonspecific abnormal finding of lung field: Secondary | ICD-10-CM

## 2016-06-23 DIAGNOSIS — Z7951 Long term (current) use of inhaled steroids: Secondary | ICD-10-CM

## 2016-06-23 LAB — BASIC METABOLIC PANEL WITH GFR
Anion gap: 11 (ref 5–15)
BUN: 13 mg/dL (ref 6–20)
CO2: 26 mmol/L (ref 22–32)
Calcium: 9.3 mg/dL (ref 8.9–10.3)
Chloride: 103 mmol/L (ref 101–111)
Creatinine, Ser: 0.76 mg/dL (ref 0.61–1.24)
GFR calc Af Amer: >60 mL/min
GFR calc non Af Amer: >60 mL/min
Glucose, Bld: 107 mg/dL — ABNORMAL HIGH (ref 65–99)
Potassium: 3.8 mmol/L (ref 3.5–5.1)
Sodium: 140 mmol/L (ref 135–145)

## 2016-06-23 LAB — CBC
HEMATOCRIT: 42.7 % (ref 40.0–52.0)
HEMOGLOBIN: 14.5 g/dL (ref 13.0–18.0)
MCH: 29.1 pg (ref 26.0–34.0)
MCHC: 34 g/dL (ref 32.0–36.0)
MCV: 85.4 fL (ref 80.0–100.0)
Platelets: 244 10*3/uL (ref 150–440)
RBC: 5 MIL/uL (ref 4.40–5.90)
RDW: 14.5 % (ref 11.5–14.5)
WBC: 15.6 10*3/uL — ABNORMAL HIGH (ref 3.8–10.6)

## 2016-06-23 LAB — LACTIC ACID, PLASMA: Lactic Acid, Venous: 1.4 mmol/L (ref 0.5–1.9)

## 2016-06-23 LAB — TROPONIN I: Troponin I: 0.03 ng/mL

## 2016-06-23 MED ORDER — ESCITALOPRAM OXALATE 10 MG PO TABS
20.0000 mg | ORAL_TABLET | ORAL | Status: DC
  Administered 2016-06-24 – 2016-06-26 (×3): 20 mg via ORAL
  Filled 2016-06-23 (×3): qty 2

## 2016-06-23 MED ORDER — IPRATROPIUM-ALBUTEROL 0.5-2.5 (3) MG/3ML IN SOLN
3.0000 mL | Freq: Once | RESPIRATORY_TRACT | Status: AC
  Administered 2016-06-23: 3 mL via RESPIRATORY_TRACT

## 2016-06-23 MED ORDER — AMLODIPINE BESYLATE 10 MG PO TABS
10.0000 mg | ORAL_TABLET | ORAL | Status: DC
  Administered 2016-06-24 – 2016-06-26 (×3): 10 mg via ORAL
  Filled 2016-06-23 (×3): qty 1

## 2016-06-23 MED ORDER — BENZONATATE 100 MG PO CAPS
100.0000 mg | ORAL_CAPSULE | Freq: Three times a day (TID) | ORAL | Status: DC
  Administered 2016-06-23 – 2016-06-26 (×9): 100 mg via ORAL
  Filled 2016-06-23 (×9): qty 1

## 2016-06-23 MED ORDER — MOMETASONE FURO-FORMOTEROL FUM 200-5 MCG/ACT IN AERO
2.0000 | INHALATION_SPRAY | Freq: Two times a day (BID) | RESPIRATORY_TRACT | Status: DC
Start: 2016-06-23 — End: 2016-06-26
  Administered 2016-06-23 – 2016-06-26 (×5): 2 via RESPIRATORY_TRACT
  Filled 2016-06-23: qty 8.8

## 2016-06-23 MED ORDER — IPRATROPIUM-ALBUTEROL 0.5-2.5 (3) MG/3ML IN SOLN
RESPIRATORY_TRACT | Status: AC
  Administered 2016-06-23: 3 mL via RESPIRATORY_TRACT
  Filled 2016-06-23: qty 6

## 2016-06-23 MED ORDER — ONDANSETRON HCL 4 MG PO TABS: 4.0000 mg | ORAL_TABLET | Freq: Four times a day (QID) | ORAL | Status: DC | PRN

## 2016-06-23 MED ORDER — DEXTROSE 5 % IV SOLN
1.0000 g | INTRAVENOUS | Status: DC
  Administered 2016-06-24 – 2016-06-25 (×2): 1 g via INTRAVENOUS
  Filled 2016-06-23 (×3): qty 10

## 2016-06-23 MED ORDER — DEXTROSE 5 % IV SOLN
500.0000 mg | INTRAVENOUS | Status: DC
  Administered 2016-06-24 – 2016-06-25 (×2): 500 mg via INTRAVENOUS
  Filled 2016-06-23 (×3): qty 500

## 2016-06-23 MED ORDER — CLONAZEPAM 0.5 MG PO TABS: 0.5000 mg | ORAL_TABLET | Freq: Two times a day (BID) | ORAL | Status: DC | PRN

## 2016-06-23 MED ORDER — MORPHINE SULFATE (PF) 2 MG/ML IV SOLN
INTRAVENOUS | Status: AC
  Administered 2016-06-23: 2 mg via INTRAVENOUS
  Filled 2016-06-23: qty 1

## 2016-06-23 MED ORDER — IPRATROPIUM-ALBUTEROL 0.5-2.5 (3) MG/3ML IN SOLN
RESPIRATORY_TRACT | Status: AC
  Administered 2016-06-23: 3 mL via RESPIRATORY_TRACT
  Filled 2016-06-23: qty 3

## 2016-06-23 MED ORDER — ACETAMINOPHEN 650 MG RE SUPP: 650.0000 mg | Freq: Four times a day (QID) | RECTAL | Status: DC | PRN

## 2016-06-23 MED ORDER — PANTOPRAZOLE SODIUM 40 MG PO TBEC
40.0000 mg | DELAYED_RELEASE_TABLET | Freq: Every day | ORAL | Status: DC
  Administered 2016-06-23 – 2016-06-26 (×4): 40 mg via ORAL
  Filled 2016-06-23 (×4): qty 1

## 2016-06-23 MED ORDER — SODIUM CHLORIDE 0.9 % IV BOLUS (SEPSIS)
1000.0000 mL | Freq: Once | INTRAVENOUS | Status: AC
Start: 2016-06-23 — End: 2016-06-23
  Administered 2016-06-23: 1000 mL via INTRAVENOUS

## 2016-06-23 MED ORDER — MORPHINE SULFATE (PF) 2 MG/ML IV SOLN
2.0000 mg | INTRAVENOUS | Status: AC
  Administered 2016-06-23: 2 mg via INTRAVENOUS

## 2016-06-23 MED ORDER — ACETAMINOPHEN 325 MG PO TABS
650.0000 mg | ORAL_TABLET | Freq: Four times a day (QID) | ORAL | Status: DC | PRN
Start: 2016-06-23 — End: 2016-06-26

## 2016-06-23 MED ORDER — MIRTAZAPINE 15 MG PO TABS
15.0000 mg | ORAL_TABLET | Freq: Every day | ORAL | Status: DC
Start: 2016-06-23 — End: 2016-06-26
  Administered 2016-06-23 – 2016-06-25 (×3): 15 mg via ORAL
  Filled 2016-06-23 (×3): qty 1

## 2016-06-23 MED ORDER — METHYLPREDNISOLONE SODIUM SUCC 125 MG IJ SOLR
60.0000 mg | Freq: Three times a day (TID) | INTRAMUSCULAR | Status: DC
  Administered 2016-06-23 – 2016-06-25 (×7): 60 mg via INTRAVENOUS
  Filled 2016-06-23 (×7): qty 2

## 2016-06-23 MED ORDER — ENOXAPARIN SODIUM 40 MG/0.4ML ~~LOC~~ SOLN
40.0000 mg | SUBCUTANEOUS | Status: DC
  Administered 2016-06-23 – 2016-06-25 (×3): 40 mg via SUBCUTANEOUS
  Filled 2016-06-23 (×4): qty 0.4

## 2016-06-23 MED ORDER — SERTRALINE HCL 100 MG PO TABS
100.0000 mg | ORAL_TABLET | ORAL | Status: DC
  Administered 2016-06-24: 100 mg via ORAL
  Filled 2016-06-23: qty 1

## 2016-06-23 MED ORDER — IPRATROPIUM-ALBUTEROL 0.5-2.5 (3) MG/3ML IN SOLN
3.0000 mL | Freq: Four times a day (QID) | RESPIRATORY_TRACT | Status: DC
  Administered 2016-06-23 – 2016-06-26 (×11): 3 mL via RESPIRATORY_TRACT
  Filled 2016-06-23 (×11): qty 3

## 2016-06-23 MED ORDER — GUAIFENESIN ER 600 MG PO TB12
600.0000 mg | ORAL_TABLET | Freq: Two times a day (BID) | ORAL | Status: DC
Start: 2016-06-23 — End: 2016-06-26
  Administered 2016-06-23 – 2016-06-26 (×6): 600 mg via ORAL
  Filled 2016-06-23 (×6): qty 1

## 2016-06-23 MED ORDER — LEVOFLOXACIN IN D5W 750 MG/150ML IV SOLN
750.0000 mg | Freq: Once | INTRAVENOUS | Status: AC
  Administered 2016-06-23: 750 mg via INTRAVENOUS
  Filled 2016-06-23: qty 150

## 2016-06-23 MED ORDER — NICOTINE 21 MG/24HR TD PT24
21.0000 mg | MEDICATED_PATCH | Freq: Every day | TRANSDERMAL | Status: DC
  Administered 2016-06-23 – 2016-06-25 (×3): 21 mg via TRANSDERMAL
  Filled 2016-06-23 (×4): qty 1

## 2016-06-23 MED ORDER — TRAZODONE HCL 100 MG PO TABS
100.0000 mg | ORAL_TABLET | Freq: Every day | ORAL | Status: DC
  Administered 2016-06-23 – 2016-06-25 (×3): 100 mg via ORAL
  Filled 2016-06-23 (×3): qty 1

## 2016-06-23 MED ORDER — SIMVASTATIN 20 MG PO TABS
20.0000 mg | ORAL_TABLET | ORAL | Status: DC
  Administered 2016-06-24 – 2016-06-26 (×3): 20 mg via ORAL
  Filled 2016-06-23 (×3): qty 1

## 2016-06-23 MED ORDER — ONDANSETRON HCL 4 MG/2ML IJ SOLN: 4.0000 mg | Freq: Four times a day (QID) | INTRAMUSCULAR | Status: DC | PRN

## 2016-06-23 NOTE — Progress Notes (Signed)
Pharmacy Antibiotic Note  Ruben [name] is a 61 y.o. male admitted on 06/23/2016 with pneumonia.  Pharmacy has been consulted for Ceftriaxone dosing. Patient received levofloxacin '750mg'$  IV x 1 dose in ED. Patient is also receiving azithromycin.   Plan: Will start patient on ceftriaxone 1gm IV every 24 hours.   Weight: 180 lb (81.6 kg)  Temp (24hrs), Avg:99.7 F (37.6 C), Min:99.7 F (37.6 C), Max:99.7 F (37.6 C)   Recent Labs Lab 06/23/16 1523 06/23/16 1627  WBC 15.6*  --   CREATININE 0.76  --   LATICACIDVEN  --  1.4    CrCl cannot be calculated (Unknown ideal weight.).    No Known Allergies  Antimicrobials this admission: 3/14levofloxacin x 1 dose 3/15 Ceftriaxone >>  3/15 azithromycin >>   Dose adjustments this admission:  Microbiology results: 3/14 BCx: sent   Thank you for allowing pharmacy to be a part of this patient's care.  Pernell Dupre, PharmD, BCPS Clinical Pharmacist 06/23/2016 7:03 PM

## 2016-06-23 NOTE — ED Triage Notes (Signed)
Pt with increased shortness of breath for three days. Pt with hx of copd.

## 2016-06-23 NOTE — ED Provider Notes (Signed)
[facility] Emergency Department Provider Note   ____________________________________________   I have reviewed the triage vital signs and the nursing notes.   HISTORY  Chief Complaint Shortness of Breath   History limited by: Not Limited   HPI Ruben [name] is a 61 y.o. male who presents to the emergency department today because of concerns for shortness of breath and feeling unwell. Patient states he has been feeling well for the past few days. He isincreasing shortness of breath. Additionally he feels some pressure to the right chest. Her COPD and has been using his nebulizer at home without any relief. The patient has not noticed any fevers. He states that he has a history of pneumonia and was hospitalized roughly 1 year ago for that. He has cut back his smoking.   Past Medical History:  Diagnosis Date  . COPD (chronic obstructive pulmonary disease) ([place])   . Depression   . Stroke [facility])     Patient Active Problem List   Diagnosis Date Noted  . Pneumonia 06/28/2015  . COPD with exacerbation ([place]) 09/25/2014  . Depression 09/25/2014  . COPD exacerbation ([place]) 09/25/2014  . Severe recurrent major depression without psychotic features ([place]) 09/25/2014    Past Surgical History:  Procedure Laterality Date  . ELBOW SURGERY Left   . WRIST SURGERY Left     Prior to Admission medications   Medication Sig Start Date End Date Taking? Authorizing Provider  ADVAIR DISKUS 250-50 MCG/DOSE AEPB Inhale 1 puff into the lungs 2 (two) times daily. 06/11/15   Historical Provider, MD  albuterol (PROAIR HFA) 108 (90 Base) MCG/ACT inhaler Inhale 2 puffs into the lungs every 6 (six) hours as needed for wheezing or shortness of breath.    Historical Provider, MD  albuterol (PROVENTIL HFA;VENTOLIN HFA) 108 (90 BASE) MCG/ACT inhaler Inhale 2 puffs into the lungs every 6 (six) hours as needed for wheezing or shortness of breath.    Marden Noble, MD  amLODipine (NORVASC)  10 MG tablet Take 10 mg by mouth every morning. 06/11/15   Historical Provider, MD  clonazePAM (KLONOPIN) 0.5 MG tablet Take 0.5 mg by mouth 2 (two) times daily as needed. For anxiety 06/11/15   Historical Provider, MD  escitalopram (LEXAPRO) 20 MG tablet Take 20 mg by mouth every morning. 06/11/15   Historical Provider, MD  ipratropium-albuterol (DUONEB) 0.5-2.5 (3) MG/3ML SOLN Take 3 mLs by nebulization 4 (four) times daily as needed. For shortness of breath/wheezing. 06/11/15   Historical Provider, MD  levofloxacin (LEVAQUIN) 750 MG tablet Take 1 tablet (750 mg total) by mouth daily. 07/01/15   Fritzi Mandes, MD  mirtazapine (REMERON) 15 MG tablet Take 1 tablet (15 mg total) by mouth at bedtime. 07/01/15   Fritzi Mandes, MD  nicotine (NICODERM CQ - DOSED IN MG/24 HOURS) 21 mg/24hr patch Place 1 patch (21 mg total) onto the skin daily. 07/01/15   Fritzi Mandes, MD  omeprazole (PRILOSEC) 20 MG capsule Take 20 mg by mouth every morning. 06/11/15   Historical Provider, MD  oseltamivir (TAMIFLU) 75 MG capsule Take 1 capsule (75 mg total) by mouth 2 (two) times daily. 07/01/15   Fritzi Mandes, MD  predniSONE (DELTASONE) 50 MG tablet Take 50 mg daily and tpaer by 10 mg daily then stop 07/02/15   Fritzi Mandes, MD  sertraline (ZOLOFT) 100 MG tablet Take 100 mg by mouth every morning. 06/11/15   Historical Provider, MD  simvastatin (ZOCOR) 20 MG tablet Take 20 mg by mouth every morning.  06/11/15   Historical Provider, MD  traZODone (DESYREL) 100 MG tablet Take 100 mg by mouth at bedtime. 06/11/15   Historical Provider, MD    Allergies Patient has no known allergies.  Family History  Problem Relation Age of Onset  . Heart disease    . Hypertension    . Diabetes      Social History Social History  Substance Use Topics  . Smoking status: Current Every Day Smoker    Packs/day: 2.00    Years: 43.00    Types: Cigarettes  . Smokeless tobacco: Never Used  . Alcohol use No    Review of Systems  Constitutional: Negative for  fever. Cardiovascular: Positive for chest pain. Respiratory: Positive for shortness of breath. Gastrointestinal: Negative for abdominal pain, vomiting and diarrhea. Genitourinary: Negative for dysuria. Musculoskeletal: Negative for back pain. Skin: Negative for rash. Neurological: Negative for headaches, focal weakness or numbness.  10-point ROS otherwise negative.  ____________________________________________   PHYSICAL EXAM:  VITAL SIGNS: ED Triage Vitals  Enc Vitals Group     BP 06/23/16 1513 114/69     Pulse Rate 06/23/16 1513 (!) 130     Resp 06/23/16 1513 (!) 26     Temp 06/23/16 1513 99.7 F (37.6 C)     Temp Source 06/23/16 1513 Oral     SpO2 06/23/16 1513 92 %     Weight 06/23/16 1512 180 lb (81.6 kg)     Height --      Head Circumference --      Peak Flow --      Pain Score 06/23/16 1512 9   Constitutional: Alert and oriented. Well appearing and in no distress. Eyes: Conjunctivae are normal. Normal extraocular movements. ENT   Head: Normocephalic and atraumatic.   Nose: No congestion/rhinnorhea.   Mouth/Throat: Mucous membranes are moist.   Neck: No stridor. Hematological/Lymphatic/Immunilogical: No cervical lymphadenopathy. Cardiovascular: Tachycardic, regular rhythm.  No murmurs, rubs, or gallops.  Respiratory: Slightly increased respiratory effort and rate. Wheezing appreciated anteriorly.  Gastrointestinal: Soft and non tender. No rebound. No guarding.  Genitourinary: Deferred Musculoskeletal: Normal range of motion in all extremities. No lower extremity edema. Neurologic:  Normal speech and language. No gross focal neurologic deficits are appreciated.  Skin:  Skin is warm, dry and intact. No rash noted. Psychiatric: Mood and affect are normal. Speech and behavior are normal. Patient exhibits appropriate insight and judgment.  ____________________________________________    LABS (pertinent positives/negatives)  Labs Reviewed  BASIC  METABOLIC PANEL - Abnormal; Notable for the following:       Result Value   Glucose, Bld 107 (*)    All other components within normal limits  CBC - Abnormal; Notable for the following:    WBC 15.6 (*)    All other components within normal limits  TROPONIN I - Abnormal; Notable for the following:    Troponin I 0.03 (*)    All other components within normal limits  CULTURE, BLOOD (ROUTINE X 2)  CULTURE, BLOOD (ROUTINE X 2)  LACTIC ACID, PLASMA  LACTIC ACID, PLASMA     ____________________________________________   EKG  I, Nance Pear, attending physician, personally viewed and interpreted this EKG  EKG Time: 1514 Rate: 125 Rhythm: sinus tachycardia Axis: right superior axis deviation Intervals: qtc 447 QRS: narrow, q waves V1, V2, V3 ST changes: no st elevation Impression: abnormal ekg   ____________________________________________    RADIOLOGY  CXR IMPRESSION:  1. Dense consolidation of the right middle lobe noted. Associated  atelectasis present.  Findings have progressed prior exam. Follow-up  exam to demonstrate clearing suggested to exclude underlying mass  lesion.   I, Cali Cuartas, personally viewed and evaluated these images (plain radiographs) as part of my medical decision making. ____________________________________________   PROCEDURES  Procedures  ____________________________________________   INITIAL IMPRESSION / ASSESSMENT AND PLAN / ED COURSE  Pertinent labs & imaging results that were available during my care of the patient were reviewed by me and considered in my medical decision making (see chart for details).  Patient presented to the emergency department today because of shortness breath feeling unwell. She had a mild leukocytosis on blood work and consolidation seen on x-ray consistent with pneumonia. Patient will be given IV antibiotics. Will plan on admission to the hospitalist  service.  ____________________________________________   FINAL CLINICAL IMPRESSION(S) / ED DIAGNOSES  Final diagnoses:  Shortness of breath  Community acquired pneumonia of right lung, unspecified part of lung     Note: This dictation was prepared with Diplomatic Services operational officer dictation. Any transcriptional errors that result from this process are unintentional     Nance Pear, MD 06/23/16 1654

## 2016-06-23 NOTE — H&P (Signed)
[place] [facility] [place] NAME: Ruben [name]    MR#:  [id number]  DATE OF BIRTH:  1955-07-15  DATE OF ADMISSION:  06/23/2016  PRIMARY CARE PHYSICIAN: Marden Noble, MD   REQUESTING/REFERRING PHYSICIAN: Dr. Nance Pear  CHIEF COMPLAINT:   Chief Complaint  Patient presents with  . Shortness of Breath    HISTORY OF PRESENT ILLNESS:  Ruben [name]  is a 61 y.o. male with a known history of  COPD not on home oxygen, history of stroke without any residual neurological deficits, depression presents from home secondary to worsening shortness of breath and cough going on for almost 3 days now. Symptoms started as cough and cold about 4 days ago. Denies any fevers or chills. His cough is more productive with thick brownish phlegm at this time. Denies any nausea vomiting. No recent travel, no exposure to sick contacts. At baseline he does not have any home oxygen and is independent. For the last 2-3 days his dyspnea has gotten to the point that he is unable to ambulate without having to stop multiple times. He does have a nebulizer at home which she has been using without much benefit. He used to smoke 2 packs per day but has recently cut down to a pack and half per day. Denies any chest pain at this time. Chest x-ray showing right middle lobe pneumonia. Patient is being admitted for community-acquired pneumonia and COPD exacerbation.  PAST MEDICAL HISTORY:   Past Medical History:  Diagnosis Date  . COPD (chronic obstructive pulmonary disease) ([place])   . Depression   . Stroke [facility])     PAST SURGICAL HISTORY:   Past Surgical History:  Procedure Laterality Date  . ELBOW SURGERY Left   . WRIST SURGERY Left     SOCIAL HISTORY:   Social History  Substance Use Topics  . Smoking status: Current Every Day Smoker    Packs/day: 2.00    Years: 43.00    Types: Cigarettes  . Smokeless tobacco: Never Used  . Alcohol use No    FAMILY HISTORY:     Family History  Problem Relation Age of Onset  . Heart disease    . Hypertension    . Diabetes    . CAD Mother     DRUG ALLERGIES:  No Known Allergies  REVIEW OF SYSTEMS:   Review of Systems  Constitutional: Positive for malaise/fatigue. Negative for chills, fever and weight loss.  HENT: Negative for ear discharge, hearing loss and nosebleeds.   Eyes: Negative for blurred vision, double vision and photophobia.  Respiratory: Positive for cough. Negative for hemoptysis, shortness of breath and wheezing.   Cardiovascular: Negative for chest pain, palpitations, orthopnea and leg swelling.  Gastrointestinal: Negative for abdominal pain, constipation, diarrhea, melena, nausea and vomiting.  Genitourinary: Negative for dysuria and urgency.  Musculoskeletal: Positive for myalgias. Negative for back pain and neck pain.  Skin: Negative for rash.  Neurological: Negative for sensory change, speech change, focal weakness and headaches.  Endo/Heme/Allergies: Does not bruise/bleed easily.  Psychiatric/Behavioral: Negative for depression.    MEDICATIONS AT HOME:   Prior to Admission medications   Medication Sig Start Date End Date Taking? Authorizing Provider  albuterol (PROVENTIL HFA;VENTOLIN HFA) 108 (90 BASE) MCG/ACT inhaler Inhale 2 puffs into the lungs every 6 (six) hours as needed for wheezing or shortness of breath.   Yes Marden Noble, MD  amLODipine (NORVASC) 10 MG tablet Take 10 mg by mouth every  morning. 06/11/15  Yes Historical Provider, MD  clonazePAM (KLONOPIN) 0.5 MG tablet Take 0.5 mg by mouth 2 (two) times daily as needed. For anxiety 06/11/15  Yes Historical Provider, MD  escitalopram (LEXAPRO) 20 MG tablet Take 20 mg by mouth every morning. 06/11/15  Yes Historical Provider, MD  ipratropium-albuterol (DUONEB) 0.5-2.5 (3) MG/3ML SOLN Take 3 mLs by nebulization 4 (four) times daily as needed. For shortness of breath/wheezing. 06/11/15  Yes Historical Provider, MD  mirtazapine  (REMERON) 15 MG tablet Take 1 tablet (15 mg total) by mouth at bedtime. Patient taking differently: Take 30 mg by mouth at bedtime.  07/01/15  Yes Fritzi Mandes, MD  omeprazole (PRILOSEC) 40 MG capsule Take 40 mg by mouth daily.  06/11/15  Yes Historical Provider, MD  simvastatin (ZOCOR) 20 MG tablet Take 20 mg by mouth every morning. 06/11/15  Yes Historical Provider, MD  traZODone (DESYREL) 150 MG tablet Take 150 mg by mouth at bedtime.  06/11/15  Yes Historical Provider, MD  ADVAIR DISKUS 250-50 MCG/DOSE AEPB Inhale 1 puff into the lungs 2 (two) times daily. 06/11/15   Historical Provider, MD  levofloxacin (LEVAQUIN) 750 MG tablet Take 1 tablet (750 mg total) by mouth daily. Patient not taking: Reported on 06/23/2016 07/01/15   Fritzi Mandes, MD  nicotine (NICODERM CQ - DOSED IN MG/24 HOURS) 21 mg/24hr patch Place 1 patch (21 mg total) onto the skin daily. Patient not taking: Reported on 06/23/2016 07/01/15   Fritzi Mandes, MD  oseltamivir (TAMIFLU) 75 MG capsule Take 1 capsule (75 mg total) by mouth 2 (two) times daily. Patient not taking: Reported on 06/23/2016 07/01/15   Fritzi Mandes, MD  predniSONE (DELTASONE) 50 MG tablet Take 50 mg daily and tpaer by 10 mg daily then stop Patient not taking: Reported on 06/23/2016 07/02/15   Fritzi Mandes, MD  sertraline (ZOLOFT) 100 MG tablet Take 100 mg by mouth every morning. 06/11/15   Historical Provider, MD      VITAL SIGNS:  Blood pressure 130/68, pulse (!) 118, temperature 99.7 F (37.6 C), temperature source Oral, resp. rate 17, weight 81.6 kg (180 lb), SpO2 96 %.  PHYSICAL EXAMINATION:   Physical Exam  GENERAL:  61 y.o.-year-old patient lying in the bed with no acute distress.  EYES: Pupils equal, round, reactive to light and accommodation. No scleral icterus. Extraocular muscles intact.  HEENT: Head atraumatic, normocephalic. Oropharynx and nasopharynx clear.  NECK:  Supple, no jugular venous distention. No thyroid enlargement, no tenderness.  LUNGS: scant breath  sounds bilaterally, scattered wheezing, no rales,rhonchi or crepitation. No use of accessory muscles of respiration.  CARDIOVASCULAR: S1, S2 normal. No murmurs, rubs, or gallops.  ABDOMEN: Soft, nontender, nondistended. Bowel sounds present. No organomegaly or mass.  EXTREMITIES: No pedal edema, cyanosis, or clubbing.  NEUROLOGIC: Cranial nerves II through XII are intact. Muscle strength 5/5 in all extremities. Sensation intact. Gait not checked.  PSYCHIATRIC: The patient is alert and oriented x 3.  SKIN: No obvious rash, lesion, or ulcer.   LABORATORY PANEL:   CBC  Recent Labs Lab 06/23/16 1523  WBC 15.6*  HGB 14.5  HCT 42.7  PLT 244   ------------------------------------------------------------------------------------------------------------------  Chemistries   Recent Labs Lab 06/23/16 1523  NA 140  K 3.8  CL 103  CO2 26  GLUCOSE 107*  BUN 13  CREATININE 0.76  CALCIUM 9.3   ------------------------------------------------------------------------------------------------------------------  Cardiac Enzymes  Recent Labs Lab 06/23/16 1523  TROPONINI 0.03*   ------------------------------------------------------------------------------------------------------------------  RADIOLOGY:  Dg Chest 2 View  Result Date: 06/23/2016 CLINICAL DATA:  Cough.  Shortness of breath. EXAM: CHEST  2 VIEW COMPARISON:  None. FINDINGS: Mediastinum hilar structures are normal. Consolidation of the right middle lobe is noted. Associated atelectasis present. Findings have progressed from prior exam. Underlying mass lesion cannot be excluded. Small apical nodular density cannot be excluded. Nipple shadows again noted. Small bilateral pleural effusions. No pneumothorax. Heart size normal. No acute bony abnormality. IMPRESSION: 1. Dense consolidation of the right middle lobe noted. Associated atelectasis present. Findings have progressed prior exam. Follow-up exam to demonstrate clearing  suggested to exclude underlying mass lesion. 2. Ill-defined density right upper lobe. This can be followed on follow-up exams. If these findings do not clear CT of the chest suggested to further evaluate . Electronically Signed   By: Marcello Moores  Register   On: 06/23/2016 15:50    EKG:   Orders placed or performed during the hospital encounter of 06/23/16  . EKG 12-Lead  . EKG 12-Lead  . ED EKG within 10 minutes  . ED EKG within 10 minutes    IMPRESSION AND PLAN:   Ruben [name]  is a 62 y.o. male with a known history of  COPD not on home oxygen, history of stroke without any residual neurological deficits, depression presents from home secondary to worsening shortness of breath and cough going on for almost 3 days now.  #1 CAP- follow blood cultures. -CT of the chest has been ordered to rule out any underlying mass. -Started on Rocephin and azithromycin.  #2 acute on chronic COPD exacerbation-not on home oxygen, currently requiring 2 L. -Cuff medications added. Continue oxygen support and wean as tolerated. -Started on IV steroids, nebs and inhalers.  #3 depression and anxiety-continue home medications.  #4 tobacco use disorder-counseled on smoking. Started on nicotine patch.  #5 DVT prophylaxis-patient is on Lovenox   All the records are reviewed and case discussed with ED provider. Management plans discussed with the patient, family and they are in agreement.  CODE STATUS: Full Code  TOTAL TIME TAKING CARE OF THIS PATIENT: 50 minutes.    Gladstone Lighter M.D on 06/23/2016 at 5:01 PM  Between 7am to 6pm - Pager - [phone]  After 6pm go to [email or web address] - password [email or web address] [facility]  Office  [phone]  CC: Primary care physician; Marden Noble, MD

## 2016-06-24 LAB — BASIC METABOLIC PANEL
Anion gap: 5 (ref 5–15)
BUN: 15 mg/dL (ref 6–20)
CO2: 28 mmol/L (ref 22–32)
CREATININE: 0.85 mg/dL (ref 0.61–1.24)
Calcium: 9 mg/dL (ref 8.9–10.3)
Chloride: 106 mmol/L (ref 101–111)
GFR calc Af Amer: >60 mL/min (ref >60)
Glucose, Bld: 162 mg/dL — ABNORMAL HIGH (ref 65–99)
Potassium: 4.1 mmol/L (ref 3.5–5.1)
SODIUM: 139 mmol/L (ref 135–145)

## 2016-06-24 LAB — CBC
HCT: 41.7 % (ref 40.0–52.0)
Hemoglobin: 14 g/dL (ref 13.0–18.0)
MCH: 28.9 pg (ref 26.0–34.0)
MCHC: 33.4 g/dL (ref 32.0–36.0)
MCV: 86.3 fL (ref 80.0–100.0)
PLATELETS: 207 10*3/uL (ref 150–440)
RBC: 4.84 MIL/uL (ref 4.40–5.90)
RDW: 14.4 % (ref 11.5–14.5)
WBC: 12 10*3/uL — AB (ref 3.8–10.6)

## 2016-06-24 NOTE — Progress Notes (Signed)
[place] at [facility]                                                                                                                                                                                  Patient Demographics   Ruben [name], is a 61 y.o. male, DOB - February 24, 1956, ERX:[id number]  Admit date - 06/23/2016   Admitting Physician Gladstone Lighter, MD  Outpatient Primary MD for the patient is Marden Noble, MD   LOS - 1  Subjective: Continues to complain of shortness of breath and cough.     Review of Systems:   CONSTITUTIONAL: No documented fever. No fatigue, weakness. No weight gain, no weight loss.  EYES: No blurry or double vision.  ENT: No tinnitus. No postnasal drip. No redness of the oropharynx.  RESPIRATORY: Positive cough, no wheeze, no hemoptysis. Positive dyspnea.  CARDIOVASCULAR: No chest pain. No orthopnea. No palpitations. No syncope.  GASTROINTESTINAL: No nausea, no vomiting or diarrhea. No abdominal pain. No melena or hematochezia.  GENITOURINARY: No dysuria or hematuria.  ENDOCRINE: No polyuria or nocturia. No heat or cold intolerance.  HEMATOLOGY: No anemia. No bruising. No bleeding.  INTEGUMENTARY: No rashes. No lesions.  MUSCULOSKELETAL: No arthritis. No swelling. No gout.  NEUROLOGIC: No numbness, tingling, or ataxia. No seizure-type activity.  PSYCHIATRIC: No anxiety. No insomnia. No ADD.    Vitals:   Vitals:   06/24/16 0238 06/24/16 0504 06/24/16 0728 06/24/16 1255  BP:  117/70 (!) 147/76   Pulse:  92 99   Resp:  18 18   Temp:  97.7 F (36.5 C) 98.4 F (36.9 C)   TempSrc:  Oral Oral   SpO2: 93% 98% 96% 96%  Weight:      Height:        Wt Readings from Last 3 Encounters:  06/23/16 156 lb (70.8 kg)  06/28/15 180 lb (81.6 kg)  09/26/14 188 lb 3.2 oz (85.4 kg)     Intake/Output Summary (Last 24 hours) at 06/24/16 1314 Last data filed at 06/24/16 0800  Gross per 24 hour  Intake             1390 ml   Output             1075 ml  Net              315 ml    Physical Exam:   GENERAL: Pleasant-appearing in no apparent distress.  HEAD, EYES, EARS, NOSE AND THROAT: Atraumatic, normocephalic. Extraocular muscles are intact. Pupils equal and reactive to light. Sclerae anicteric. No conjunctival injection. No oro-pharyngeal erythema.  NECK: Supple. There is no jugular venous distention. No bruits,  no lymphadenopathy, no thyromegaly.  HEART: Regular rate and rhythm,. No murmurs, no rubs, no clicks.  LUNGS: b/l wheezing . No rales or rhonchi.  ABDOMEN: Soft, flat, nontender, nondistended. Has good bowel sounds. No hepatosplenomegaly appreciated.  EXTREMITIES: No evidence of any cyanosis, clubbing, or peripheral edema.  +2 pedal and radial pulses bilaterally.  NEUROLOGIC: The patient is alert, awake, and oriented x3 with no focal motor or sensory deficits appreciated bilaterally.  SKIN: Moist and warm with no rashes appreciated.  Psych: Not anxious, depressed LN: No inguinal LN enlargement    Antibiotics   Anti-infectives    Start     Dose/Rate Route Frequency Ordered Stop   06/24/16 1600  azithromycin (ZITHROMAX) 500 mg in dextrose 5 % 250 mL IVPB     500 mg 250 mL/hr over 60 Minutes Intravenous Every 24 hours 06/23/16 1856     06/24/16 1000  cefTRIAXone (ROCEPHIN) 1 g in dextrose 5 % 50 mL IVPB     1 g 100 mL/hr over 30 Minutes Intravenous Every 24 hours 06/23/16 1901     06/23/16 1615  levofloxacin (LEVAQUIN) IVPB 750 mg     750 mg 100 mL/hr over 90 Minutes Intravenous  Once 06/23/16 1609 06/23/16 1811      Medications   Scheduled Meds: . amLODipine  10 mg Oral BH-q7a  . azithromycin  500 mg Intravenous Q24H  . benzonatate  100 mg Oral TID  . cefTRIAXone (ROCEPHIN) IVPB 1 gram/50 mL D5W  1 g Intravenous Q24H  . enoxaparin (LOVENOX) injection  40 mg Subcutaneous Q24H  . escitalopram  20 mg Oral BH-q7a  . guaiFENesin  600 mg Oral BID  . ipratropium-albuterol  3 mL Nebulization  Q6H  . methylPREDNISolone (SOLU-MEDROL) injection  60 mg Intravenous Q8H  . mirtazapine  15 mg Oral QHS  . mometasone-formoterol  2 puff Inhalation BID  . nicotine  21 mg Transdermal Daily  . pantoprazole  40 mg Oral QAC breakfast  . sertraline  100 mg Oral BH-q7a  . simvastatin  20 mg Oral BH-q7a  . traZODone  100 mg Oral QHS   Continuous Infusions: PRN Meds:.acetaminophen **OR** acetaminophen, clonazePAM, ondansetron **OR** ondansetron (ZOFRAN) IV   Data Review:   Micro Results Recent Results (from the past 240 hour(s))  Blood culture (routine x 2)     Status: None (Preliminary result)   Collection Time: 06/23/16  4:27 PM  Result Value Ref Range Status   Specimen Description BLOOD LEFT AC  Final   Special Requests BOTTLES DRAWN AEROBIC AND ANAEROBIC BCAV  Final   Culture NO GROWTH < 24 HOURS  Final   Report Status PENDING  Incomplete  Blood culture (routine x 2)     Status: None (Preliminary result)   Collection Time: 06/23/16  4:28 PM  Result Value Ref Range Status   Specimen Description BLOOD RIGHT AC  Final   Special Requests BOTTLES DRAWN AEROBIC AND ANAEROBIC BCAV  Final   Culture NO GROWTH < 24 HOURS  Final   Report Status PENDING  Incomplete    Radiology Reports Dg Chest 2 View  Result Date: 06/23/2016 CLINICAL DATA:  Cough.  Shortness of breath. EXAM: CHEST  2 VIEW COMPARISON:  None. FINDINGS: Mediastinum hilar structures are normal. Consolidation of the right middle lobe is noted. Associated atelectasis present. Findings have progressed from prior exam. Underlying mass lesion cannot be excluded. Small apical nodular density cannot be excluded. Nipple shadows again noted. Small bilateral pleural effusions. No pneumothorax. Heart size normal.  No acute bony abnormality. IMPRESSION: 1. Dense consolidation of the right middle lobe noted. Associated atelectasis present. Findings have progressed prior exam. Follow-up exam to demonstrate clearing suggested to exclude  underlying mass lesion. 2. Ill-defined density right upper lobe. This can be followed on follow-up exams. If these findings do not clear CT of the chest suggested to further evaluate . Electronically Signed   By: Marcello Moores  Register   On: 06/23/2016 15:50   Ct Chest Wo Contrast  Result Date: 06/23/2016 CLINICAL DATA:  Shortness of breath. EXAM: CT CHEST WITHOUT CONTRAST TECHNIQUE: Multidetector CT imaging of the chest was performed following the standard protocol without IV contrast. COMPARISON:  None. FINDINGS: Cardiovascular: Normal heart size. Aortic atherosclerosis. No pericardial effusion. Mediastinum/Nodes: The trachea appears patent and is midline. The esophagus is unremarkable. Right sided mediastinal and sub- carinal adenopathy identified. Index right paratracheal lymph node measures 1.6 cm, image 64 of series 2. Lungs/Pleura: No pleural effusion. There is diffuse bronchial wall thickening. Mild changes of scratch set mild to moderate changes of centrilobular emphysema identified. There is near complete consolidation of the right middle lobe. Focal narrowing of the right middle lobar bronchus is identified, image number 94 of series 3 and image 59 of series 6. Spiculated nodule within the right apex measures 1.7 cm, image 61 of series 6. Upper Abdomen: No acute abnormality. Musculoskeletal: No aggressive lytic or sclerotic bone lesion. IMPRESSION: 1. There is near complete consolidation/postobstructive pneumonitis involving the right middle lobe. Central narrowing of the right middle lobe bronchus is identified. Underlying obstructing neoplasm cannot be excluded. Consider further investigation with bronchoscopy and tissue sampling. 2. Suspicious, spiculated nodule in the right lung apex. Additionally there are enlarged right paratracheal and sub- carinal lymph nodes. Further investigation with PET-CT advised. 3. Emphysema 4. Aortic atherosclerosis Electronically Signed   By: Kerby Moors M.D.   On:  06/23/2016 17:14     CBC  Recent Labs Lab 06/23/16 1523 06/24/16 0511  WBC 15.6* 12.0*  HGB 14.5 14.0  HCT 42.7 41.7  PLT 244 207  MCV 85.4 86.3  MCH 29.1 28.9  MCHC 34.0 33.4  RDW 14.5 14.4    Chemistries   Recent Labs Lab 06/23/16 1523 06/24/16 0511  NA 140 139  K 3.8 4.1  CL 103 106  CO2 26 28  GLUCOSE 107* 162*  BUN 13 15  CREATININE 0.76 0.85  CALCIUM 9.3 9.0   ------------------------------------------------------------------------------------------------------------------ estimated creatinine clearance is 92.5 mL/min (by C-G formula based on SCr of 0.85 mg/dL). ------------------------------------------------------------------------------------------------------------------ No results for input(s): HGBA1C in the last 72 hours. ------------------------------------------------------------------------------------------------------------------ No results for input(s): CHOL, HDL, LDLCALC, TRIG, CHOLHDL, LDLDIRECT in the last 72 hours. ------------------------------------------------------------------------------------------------------------------ No results for input(s): TSH, T4TOTAL, T3FREE, THYROIDAB in the last 72 hours.  Invalid input(s): FREET3 ------------------------------------------------------------------------------------------------------------------ No results for input(s): VITAMINB12, FOLATE, FERRITIN, TIBC, IRON, RETICCTPCT in the last 72 hours.  Coagulation profile No results for input(s): INR, PROTIME in the last 168 hours.  No results for input(s): DDIMER in the last 72 hours.  Cardiac Enzymes  Recent Labs Lab 06/23/16 1523  TROPONINI 0.03*   ------------------------------------------------------------------------------------------------------------------ Invalid input(s): POCBNP    Assessment & Plan   Ruben [name]  is a 61 y.o. male with a known history of  COPD not on home oxygen, history of stroke without any residual  neurological deficits, depression presents from home secondary to worsening shortness of breath and cough going on for almost 3 days now.  #1 CAP-  Continue IV antibiotics There is a concern  for postobstructive component to this I will asked pulmonary to see to see patient is bronchoscopy   #2 acute on chronic COPD exacerbation-not on home oxygen, currently requiring 2 L. -. Continue oxygen support and wean as tolerated. -Continue IV steroids, nebs and inhalers.  #3 depression and anxiety-continue home medications.  #4 tobacco use disorder-counseled on smoking.  on nicotine patch.  #5 DVT prophylaxis-patient is on Lovenox        Code Status Orders        Start     Ordered   06/23/16 1857  Full code  Continuous     06/23/16 1856    Code Status History    Date Active Date Inactive Code Status Order ID Comments User Context   06/28/2015  1:09 PM 07/01/2015  7:36 PM Full Code [id number]  Bettey Costa, MD Inpatient   06/28/2015 11:09 AM 06/28/2015 11:15 AM DNR [id number]  Bettey Costa, MD ED   09/25/2014  4:34 AM 09/26/2014  2:41 PM DNR [id number]  Lance Coon, MD Inpatient           Consults  pulmonary   DVT Prophylaxis  Lovenox    Lab Results  Component Value Date   PLT 207 06/24/2016     Time Spent in minutes  38mn  Greater than 50% of time spent in care coordination and counseling patient regarding the condition and plan of care.   PDustin FlockM.D on 06/24/2016 at 1:14 PM  Between 7am to 6pm - Pager - [phone]  After 6pm go to [email or web address] - password [email or web address]   Office  [phone]

## 2016-06-25 LAB — BASIC METABOLIC PANEL
Anion gap: 5 (ref 5–15)
BUN: 19 mg/dL (ref 6–20)
CALCIUM: 9 mg/dL (ref 8.9–10.3)
CHLORIDE: 107 mmol/L (ref 101–111)
CO2: 28 mmol/L (ref 22–32)
CREATININE: 0.69 mg/dL (ref 0.61–1.24)
GFR calc Af Amer: >60 mL/min (ref >60)
GFR calc non Af Amer: >60 mL/min (ref >60)
Glucose, Bld: 154 mg/dL — ABNORMAL HIGH (ref 65–99)
Potassium: 3.8 mmol/L (ref 3.5–5.1)
SODIUM: 140 mmol/L (ref 135–145)

## 2016-06-25 LAB — CBC
HCT: 35.6 % — ABNORMAL LOW (ref 40.0–52.0)
Hemoglobin: 12.2 g/dL — ABNORMAL LOW (ref 13.0–18.0)
MCH: 29.2 pg (ref 26.0–34.0)
MCHC: 34.3 g/dL (ref 32.0–36.0)
MCV: 85.3 fL (ref 80.0–100.0)
Platelets: 246 10*3/uL (ref 150–440)
RBC: 4.17 MIL/uL — ABNORMAL LOW (ref 4.40–5.90)
RDW: 14 % (ref 11.5–14.5)
WBC: 15.6 10*3/uL — ABNORMAL HIGH (ref 3.8–10.6)

## 2016-06-25 LAB — HIV ANTIBODY (ROUTINE TESTING W REFLEX): HIV SCREEN 4TH GENERATION: NONREACTIVE

## 2016-06-25 NOTE — Progress Notes (Signed)
[place] at [facility]                                                                                                                                                                                  Patient Demographics   Ruben [name], is a 61 y.o. male, DOB - January 31, 1956, IRJ:[id number]  Admit date - 06/23/2016   Admitting Physician Gladstone Lighter, MD  Outpatient Primary MD for the patient is Marden Noble, MD   LOS - 2  Subjective:  She reports some improvement in his breathing. The waning pulmonary see the patient he denies any chest pains still some wheezing     Review of Systems:   CONSTITUTIONAL: No documented fever. No fatigue, weakness. No weight gain, no weight loss.  EYES: No blurry or double vision.  ENT: No tinnitus. No postnasal drip. No redness of the oropharynx.  RESPIRATORY: Positive cough, no wheeze, no hemoptysis. Positive dyspnea.  CARDIOVASCULAR: No chest pain. No orthopnea. No palpitations. No syncope.  GASTROINTESTINAL: No nausea, no vomiting or diarrhea. No abdominal pain. No melena or hematochezia.  GENITOURINARY: No dysuria or hematuria.  ENDOCRINE: No polyuria or nocturia. No heat or cold intolerance.  HEMATOLOGY: No anemia. No bruising. No bleeding.  INTEGUMENTARY: No rashes. No lesions.  MUSCULOSKELETAL: No arthritis. No swelling. No gout.  NEUROLOGIC: No numbness, tingling, or ataxia. No seizure-type activity.  PSYCHIATRIC: No anxiety. No insomnia. No ADD.    Vitals:   Vitals:   06/25/16 0354 06/25/16 0728 06/25/16 0808 06/25/16 1233  BP: 127/60  140/69   Pulse: 97  99   Resp: 19  18   Temp: 97.6 F (36.4 C)  98.7 F (37.1 C)   TempSrc: Oral  Oral   SpO2: 94% 94% 96% 95%  Weight:      Height:        Wt Readings from Last 3 Encounters:  06/23/16 156 lb (70.8 kg)  06/28/15 180 lb (81.6 kg)  09/26/14 188 lb 3.2 oz (85.4 kg)     Intake/Output Summary (Last 24 hours) at 06/25/16 1242 Last data filed  at 06/25/16 0900  Gross per 24 hour  Intake              900 ml  Output              970 ml  Net              -70 ml    Physical Exam:   GENERAL: Pleasant-appearing in no apparent distress.  HEAD, EYES, EARS, NOSE AND THROAT: Atraumatic, normocephalic. Extraocular muscles are intact. Pupils equal and reactive to light. Sclerae anicteric. No conjunctival injection.  No oro-pharyngeal erythema.  NECK: Supple. There is no jugular venous distention. No bruits, no lymphadenopathy, no thyromegaly.  HEART: Regular rate and rhythm,. No murmurs, no rubs, no clicks.  LUNGS: b/l wheezing . No rales or rhonchi.  ABDOMEN: Soft, flat, nontender, nondistended. Has good bowel sounds. No hepatosplenomegaly appreciated.  EXTREMITIES: No evidence of any cyanosis, clubbing, or peripheral edema.  +2 pedal and radial pulses bilaterally.  NEUROLOGIC: The patient is alert, awake, and oriented x3 with no focal motor or sensory deficits appreciated bilaterally.  SKIN: Moist and warm with no rashes appreciated.  Psych: Not anxious, depressed LN: No inguinal LN enlargement    Antibiotics   Anti-infectives    Start     Dose/Rate Route Frequency Ordered Stop   06/24/16 1600  azithromycin (ZITHROMAX) 500 mg in dextrose 5 % 250 mL IVPB     500 mg 250 mL/hr over 60 Minutes Intravenous Every 24 hours 06/23/16 1856     06/24/16 1000  cefTRIAXone (ROCEPHIN) 1 g in dextrose 5 % 50 mL IVPB     1 g 100 mL/hr over 30 Minutes Intravenous Every 24 hours 06/23/16 1901     06/23/16 1615  levofloxacin (LEVAQUIN) IVPB 750 mg     750 mg 100 mL/hr over 90 Minutes Intravenous  Once 06/23/16 1609 06/23/16 1811      Medications   Scheduled Meds: . amLODipine  10 mg Oral BH-q7a  . azithromycin  500 mg Intravenous Q24H  . benzonatate  100 mg Oral TID  . cefTRIAXone (ROCEPHIN) IVPB 1 gram/50 mL D5W  1 g Intravenous Q24H  . enoxaparin (LOVENOX) injection  40 mg Subcutaneous Q24H  . escitalopram  20 mg Oral BH-q7a  .  guaiFENesin  600 mg Oral BID  . ipratropium-albuterol  3 mL Nebulization Q6H  . methylPREDNISolone (SOLU-MEDROL) injection  60 mg Intravenous Q8H  . mirtazapine  15 mg Oral QHS  . mometasone-formoterol  2 puff Inhalation BID  . nicotine  21 mg Transdermal Daily  . pantoprazole  40 mg Oral QAC breakfast  . simvastatin  20 mg Oral BH-q7a  . traZODone  100 mg Oral QHS   Continuous Infusions: PRN Meds:.acetaminophen **OR** acetaminophen, clonazePAM, ondansetron **OR** ondansetron (ZOFRAN) IV   Data Review:   Micro Results Recent Results (from the past 240 hour(s))  Blood culture (routine x 2)     Status: None (Preliminary result)   Collection Time: 06/23/16  4:27 PM  Result Value Ref Range Status   Specimen Description BLOOD LEFT AC  Final   Special Requests BOTTLES DRAWN AEROBIC AND ANAEROBIC BCAV  Final   Culture NO GROWTH 2 DAYS  Final   Report Status PENDING  Incomplete  Blood culture (routine x 2)     Status: None (Preliminary result)   Collection Time: 06/23/16  4:28 PM  Result Value Ref Range Status   Specimen Description BLOOD RIGHT AC  Final   Special Requests BOTTLES DRAWN AEROBIC AND ANAEROBIC BCAV  Final   Culture NO GROWTH 2 DAYS  Final   Report Status PENDING  Incomplete    Radiology Reports Dg Chest 2 View  Result Date: 06/23/2016 CLINICAL DATA:  Cough.  Shortness of breath. EXAM: CHEST  2 VIEW COMPARISON:  None. FINDINGS: Mediastinum hilar structures are normal. Consolidation of the right middle lobe is noted. Associated atelectasis present. Findings have progressed from prior exam. Underlying mass lesion cannot be excluded. Small apical nodular density cannot be excluded. Nipple shadows again noted. Small bilateral pleural effusions. No  pneumothorax. Heart size normal. No acute bony abnormality. IMPRESSION: 1. Dense consolidation of the right middle lobe noted. Associated atelectasis present. Findings have progressed prior exam. Follow-up exam to demonstrate clearing  suggested to exclude underlying mass lesion. 2. Ill-defined density right upper lobe. This can be followed on follow-up exams. If these findings do not clear CT of the chest suggested to further evaluate . Electronically Signed   By: Marcello Moores  Register   On: 06/23/2016 15:50   Ct Chest Wo Contrast  Result Date: 06/23/2016 CLINICAL DATA:  Shortness of breath. EXAM: CT CHEST WITHOUT CONTRAST TECHNIQUE: Multidetector CT imaging of the chest was performed following the standard protocol without IV contrast. COMPARISON:  None. FINDINGS: Cardiovascular: Normal heart size. Aortic atherosclerosis. No pericardial effusion. Mediastinum/Nodes: The trachea appears patent and is midline. The esophagus is unremarkable. Right sided mediastinal and sub- carinal adenopathy identified. Index right paratracheal lymph node measures 1.6 cm, image 64 of series 2. Lungs/Pleura: No pleural effusion. There is diffuse bronchial wall thickening. Mild changes of scratch set mild to moderate changes of centrilobular emphysema identified. There is near complete consolidation of the right middle lobe. Focal narrowing of the right middle lobar bronchus is identified, image number 94 of series 3 and image 59 of series 6. Spiculated nodule within the right apex measures 1.7 cm, image 61 of series 6. Upper Abdomen: No acute abnormality. Musculoskeletal: No aggressive lytic or sclerotic bone lesion. IMPRESSION: 1. There is near complete consolidation/postobstructive pneumonitis involving the right middle lobe. Central narrowing of the right middle lobe bronchus is identified. Underlying obstructing neoplasm cannot be excluded. Consider further investigation with bronchoscopy and tissue sampling. 2. Suspicious, spiculated nodule in the right lung apex. Additionally there are enlarged right paratracheal and sub- carinal lymph nodes. Further investigation with PET-CT advised. 3. Emphysema 4. Aortic atherosclerosis Electronically Signed   By: Kerby Moors M.D.   On: 06/23/2016 17:14     CBC  Recent Labs Lab 06/23/16 1523 06/24/16 0511 06/25/16 0345  WBC 15.6* 12.0* 15.6*  HGB 14.5 14.0 12.2*  HCT 42.7 41.7 35.6*  PLT 244 207 246  MCV 85.4 86.3 85.3  MCH 29.1 28.9 29.2  MCHC 34.0 33.4 34.3  RDW 14.5 14.4 14.0    Chemistries   Recent Labs Lab 06/23/16 1523 06/24/16 0511 06/25/16 0345  NA 140 139 140  K 3.8 4.1 3.8  CL 103 106 107  CO2 '26 28 28  '$ GLUCOSE 107* 162* 154*  BUN '13 15 19  '$ CREATININE 0.76 0.85 0.69  CALCIUM 9.3 9.0 9.0   ------------------------------------------------------------------------------------------------------------------ estimated creatinine clearance is 98.3 mL/min (by C-G formula based on SCr of 0.69 mg/dL). ------------------------------------------------------------------------------------------------------------------ No results for input(s): HGBA1C in the last 72 hours. ------------------------------------------------------------------------------------------------------------------ No results for input(s): CHOL, HDL, LDLCALC, TRIG, CHOLHDL, LDLDIRECT in the last 72 hours. ------------------------------------------------------------------------------------------------------------------ No results for input(s): TSH, T4TOTAL, T3FREE, THYROIDAB in the last 72 hours.  Invalid input(s): FREET3 ------------------------------------------------------------------------------------------------------------------ No results for input(s): VITAMINB12, FOLATE, FERRITIN, TIBC, IRON, RETICCTPCT in the last 72 hours.  Coagulation profile No results for input(s): INR, PROTIME in the last 168 hours.  No results for input(s): DDIMER in the last 72 hours.  Cardiac Enzymes  Recent Labs Lab 06/23/16 1523  TROPONINI 0.03*   ------------------------------------------------------------------------------------------------------------------ Invalid input(s): POCBNP    Assessment & Plan   Candice Lunney  is a 61 y.o. male with a known history of  COPD not on home oxygen, history of stroke without any residual neurological deficits, depression presents from home secondary to  worsening shortness of breath and cough going on for almost 3 days now.  #1 CAP-  Continue IV antibiotics There is a concern for postobstructive component - pt wishes to have bronch as out patient Await pulmonary input, Dr. Humphrey Rolls has been consulted  #2 acute on chronic COPD exacerbation-not on home oxygen, currently requiring 02 -. Continue oxygen support and wean as tolerated. -Continue IV steroids, nebs and inhalers.  #3 depression and anxiety-continue home medications.  #4 tobacco use disorder-counseled on smoking.  on nicotine patch.  #5 DVT prophylaxis-patient is on Lovenox        Code Status Orders        Start     Ordered   06/23/16 1857  Full code  Continuous     06/23/16 1856    Code Status History    Date Active Date Inactive Code Status Order ID Comments User Context   06/28/2015  1:09 PM 07/01/2015  7:36 PM Full Code [id number]  Bettey Costa, MD Inpatient   06/28/2015 11:09 AM 06/28/2015 11:15 AM DNR [id number]  Bettey Costa, MD ED   09/25/2014  4:34 AM 09/26/2014  2:41 PM DNR [id number]  Lance Coon, MD Inpatient           Consults  pulmonary   DVT Prophylaxis  Lovenox    Lab Results  Component Value Date   PLT 246 06/25/2016     Time Spent in minutes  9mn  Greater than 50% of time spent in care coordination and counseling patient regarding the condition and plan of care.   PDustin FlockM.D on 06/25/2016 at 12:42 PM  Between 7am to 6pm - Pager - [phone]  After 6pm go to [email or web address] - password [email or web address]   Office  [phone]

## 2016-06-25 NOTE — Progress Notes (Signed)
Pharmacy Antibiotic Note  Ruben [name] is a 61 y.o. male admitted on 06/23/2016 with pneumonia.  Pharmacy has been consulted for Ceftriaxone dosing. Patient received levofloxacin '750mg'$  IV x 1 dose in ED. Patient is also receiving azithromycin.   Plan: Continue ceftriaxone 1gm IV every 24 hours.  recommend a total of 7 days Recommend stopping azithromycin after 5 days 3/19   Height: '5\' 10"'$  (177.8 cm) Weight: 156 lb (70.8 kg) IBW/kg (Calculated) : 73  Temp (24hrs), Avg:98.2 F (36.8 C), Min:97.6 F (36.4 C), Max:98.7 F (37.1 C)   Recent Labs Lab 06/23/16 1523 06/23/16 1627 06/24/16 0511 06/25/16 0345  WBC 15.6*  --  12.0* 15.6*  CREATININE 0.76  --  0.85 0.69  LATICACIDVEN  --  1.4  --   --     Estimated Creatinine Clearance: 98.3 mL/min (by C-G formula based on SCr of 0.69 mg/dL).    No Known Allergies  Antimicrobials this admission: 3/14levofloxacin x 1 dose 3/15 Ceftriaxone >>  3/15 azithromycin >>   Dose adjustments this admission:  Microbiology results: 3/14 BCx: sent   Thank you for allowing pharmacy to be a part of this patient's care.  Ramond Dial, PharmD, BCPS Clinical Pharmacist 06/25/2016 12:53 PM

## 2016-06-25 NOTE — Plan of Care (Signed)
Problem: Safety: Goal: Ability to remain free from injury will improve Outcome: Progressing Pt able to remain free of falls this shift. Rings for assistance  Problem: Pain Managment: Goal: General experience of comfort will improve Outcome: Progressing No complaints of pain this shift.  Problem: Fluid Volume: Goal: Ability to maintain a balanced intake and output will improve Outcome: Progressing Eating and drinking without difficulty

## 2016-06-26 MED ORDER — AMLODIPINE BESYLATE 10 MG PO TABS: 10.0000 mg | ORAL_TABLET | ORAL | 2 refills | Status: DC

## 2016-06-26 MED ORDER — MIRTAZAPINE 15 MG PO TABS: 15.0000 mg | ORAL_TABLET | Freq: Every day | ORAL | 0 refills | Status: DC

## 2016-06-26 MED ORDER — BENZONATATE 100 MG PO CAPS: 100.0000 mg | ORAL_CAPSULE | Freq: Three times a day (TID) | ORAL | 0 refills | Status: DC

## 2016-06-26 MED ORDER — IPRATROPIUM-ALBUTEROL 0.5-2.5 (3) MG/3ML IN SOLN: 3.0000 mL | Freq: Four times a day (QID) | RESPIRATORY_TRACT | 0 refills | Status: DC | PRN

## 2016-06-26 MED ORDER — AMOXICILLIN-POT CLAVULANATE 875-125 MG PO TABS: 1.0000 | ORAL_TABLET | Freq: Two times a day (BID) | ORAL | 0 refills | Status: DC

## 2016-06-26 MED ORDER — ALBUTEROL SULFATE HFA 108 (90 BASE) MCG/ACT IN AERS: 2.0000 | INHALATION_SPRAY | Freq: Four times a day (QID) | RESPIRATORY_TRACT | 2 refills | Status: DC | PRN

## 2016-06-26 MED ORDER — ESCITALOPRAM OXALATE 20 MG PO TABS: 20.0000 mg | ORAL_TABLET | ORAL | 2 refills | Status: DC

## 2016-06-26 MED ORDER — GUAIFENESIN ER 600 MG PO TB12: 600.0000 mg | ORAL_TABLET | Freq: Two times a day (BID) | ORAL | 2 refills | Status: DC

## 2016-06-26 MED ORDER — PREDNISONE 10 MG (21) PO TBPK: ORAL_TABLET | ORAL | 0 refills | Status: DC

## 2016-06-26 MED ORDER — ADVAIR DISKUS 250-50 MCG/DOSE IN AEPB: 1.0000 | INHALATION_SPRAY | Freq: Two times a day (BID) | RESPIRATORY_TRACT | 0 refills | Status: DC

## 2016-06-26 NOTE — Consult Note (Signed)
Pulmonary Critical Care  Initial Consult Note   Ruben [name]:[id number] DOB: Nov 27, 1955 DOA: 06/23/2016  Referring physician: Gladstone Lighter PCP: Marden Noble, MD   Chief Complaint: Lung mass  HPI: Ruben [name] is a 61 y.o. male with history of tobacco use presented to the hospital with increased SOB. Patient has been having worsening of his breathing so came to be assessed. He has had cough and sputum production. has no hemoptysis patient states that he has been a heavy smoker of 2 PPD. On evaluation he was noted to have a lung mass and post obstructive pneumonia. He will need an airway evaluation so will need a bronch. Now he states that he wants to be sent home and will follow up in the office for the bronch to be setup.   Review of Systems:  Constitutional:  No weight loss, night sweats, Fevers, chills, +fatigue.  HEENT:  No headaches, nasal congestion, post nasal drip,  Cardio-vascular:  No chest pain, swelling in lower extremities, anasarca, dizziness, palpitations  GI:  No heartburn, indigestion, abdominal pain, nausea, vomiting, diarrhea  Resp:  +shortness of breath +productive cough, +wheezing Skin:  no rash or lesions.  Musculoskeletal:  No joint pain or swelling.   Remainder ROS performed and is unremarkable other than noted in HPI  Past Medical History:  Diagnosis Date  . COPD (chronic obstructive pulmonary disease) ([place])   . Depression   . Stroke [facility])    Past Surgical History:  Procedure Laterality Date  . ELBOW SURGERY Left   . WRIST SURGERY Left    Social History:  reports that he has been smoking Cigarettes.  He has a 86.00 pack-year smoking history. He has never used smokeless tobacco. He reports that he uses drugs, including Marijuana. He reports that he does not drink alcohol.  No Known Allergies  Family History  Problem Relation Age of Onset  . Heart disease    . Hypertension    . Diabetes    . CAD Mother     Prior to Admission  medications   Medication Sig Start Date End Date Taking? Authorizing Provider  clonazePAM (KLONOPIN) 0.5 MG tablet Take 0.5 mg by mouth 2 (two) times daily as needed. For anxiety 06/11/15  Yes Historical Provider, MD  omeprazole (PRILOSEC) 40 MG capsule Take 40 mg by mouth daily.  06/11/15  Yes Historical Provider, MD  simvastatin (ZOCOR) 20 MG tablet Take 20 mg by mouth every morning. 06/11/15  Yes Historical Provider, MD  traZODone (DESYREL) 150 MG tablet Take 150 mg by mouth at bedtime.  06/11/15  Yes Historical Provider, MD  ADVAIR DISKUS 250-50 MCG/DOSE AEPB Inhale 1 puff into the lungs 2 (two) times daily. 06/26/16   Gladstone Lighter, MD  albuterol (PROVENTIL HFA;VENTOLIN HFA) 108 (90 Base) MCG/ACT inhaler Inhale 2 puffs into the lungs every 6 (six) hours as needed for wheezing or shortness of breath. 06/26/16   Gladstone Lighter, MD  amLODipine (NORVASC) 10 MG tablet Take 1 tablet (10 mg total) by mouth every morning. 06/26/16   Gladstone Lighter, MD  amoxicillin-clavulanate (AUGMENTIN) 875-125 MG tablet Take 1 tablet by mouth 2 (two) times daily. X 7 more days 06/26/16   Gladstone Lighter, MD  benzonatate (TESSALON) 100 MG capsule Take 1 capsule (100 mg total) by mouth 3 (three) times daily. 06/26/16   Gladstone Lighter, MD  escitalopram (LEXAPRO) 20 MG tablet Take 1 tablet (20 mg total) by mouth every morning. 06/26/16   Gladstone Lighter, MD  guaiFENesin (  MUCINEX) 600 MG 12 hr tablet Take 1 tablet (600 mg total) by mouth 2 (two) times daily. 06/26/16   Gladstone Lighter, MD  ipratropium-albuterol (DUONEB) 0.5-2.5 (3) MG/3ML SOLN Take 3 mLs by nebulization 4 (four) times daily as needed. For shortness of breath/wheezing. 06/26/16   Gladstone Lighter, MD  levofloxacin (LEVAQUIN) 750 MG tablet Take 1 tablet (750 mg total) by mouth daily. Patient not taking: Reported on 06/23/2016 07/01/15   Fritzi Mandes, MD  mirtazapine (REMERON) 15 MG tablet Take 1 tablet (15 mg total) by mouth at bedtime. 06/26/16   Gladstone Lighter, MD  nicotine (NICODERM CQ - DOSED IN MG/24 HOURS) 21 mg/24hr patch Place 1 patch (21 mg total) onto the skin daily. Patient not taking: Reported on 06/23/2016 07/01/15   Fritzi Mandes, MD  oseltamivir (TAMIFLU) 75 MG capsule Take 1 capsule (75 mg total) by mouth 2 (two) times daily. Patient not taking: Reported on 06/23/2016 07/01/15   Fritzi Mandes, MD  predniSONE (DELTASONE) 50 MG tablet Take 50 mg daily and tpaer by 10 mg daily then stop Patient not taking: Reported on 06/23/2016 07/02/15   Fritzi Mandes, MD  predniSONE (STERAPRED UNI-PAK 21 TAB) 10 MG (21) TBPK tablet 6 tabs PO x 1 day 5 tabs PO x 1 day 4 tabs PO x 1 day 3 tabs PO x 1 day 2 tabs PO x 1 day 1 tab PO x 1 day and stop 06/26/16   Gladstone Lighter, MD  sertraline (ZOLOFT) 100 MG tablet Take 100 mg by mouth every morning. 06/11/15   Historical Provider, MD   Physical Exam: Vitals:   06/25/16 2042 06/26/16 0200 06/26/16 0603 06/26/16 0807  BP:   (!) 141/68   Pulse:   87   Resp:   18   Temp:   97.5 F (36.4 C)   TempSrc:   Oral   SpO2: 93% 95% 95% 96%  Weight:      Height:        Wt Readings from Last 3 Encounters:  06/23/16 70.8 kg (156 lb)  06/28/15 81.6 kg (180 lb)  09/26/14 85.4 kg (188 lb 3.2 oz)    General:  Appears calm and comfortable Eyes: PERRL, normal lids, irises & conjunctiva ENT: grossly normal hearing, lips & tongue Neck: no LAD, masses or thyromegaly Cardiovascular: RRR, no m/r/g. No LE edema. Respiratory: diminished on right side. Normal respiratory effort. Abdomen: soft, nontender Skin: no rash or induration seen on limited exam Musculoskeletal: grossly normal tone BUE/BLE Psychiatric: grossly normal mood and affect Neurologic: grossly non-focal.          Labs on Admission:  Basic Metabolic Panel:  Recent Labs Lab 06/23/16 1523 06/24/16 0511 06/25/16 0345  NA 140 139 140  K 3.8 4.1 3.8  CL 103 106 107  CO2 '26 28 28  '$ GLUCOSE 107* 162* 154*  BUN '13 15 19  '$ CREATININE 0.76 0.85 0.69   CALCIUM 9.3 9.0 9.0   Liver Function Tests: No results for input(s): AST, ALT, ALKPHOS, BILITOT, PROT, ALBUMIN in the last 168 hours. No results for input(s): LIPASE, AMYLASE in the last 168 hours. No results for input(s): AMMONIA in the last 168 hours. CBC:  Recent Labs Lab 06/23/16 1523 06/24/16 0511 06/25/16 0345  WBC 15.6* 12.0* 15.6*  HGB 14.5 14.0 12.2*  HCT 42.7 41.7 35.6*  MCV 85.4 86.3 85.3  PLT 244 207 246   Cardiac Enzymes:  Recent Labs Lab 06/23/16 1523  TROPONINI 0.03*    BNP (last 3 results)  No results for input(s): BNP in the last 8760 hours.  ProBNP (last 3 results) No results for input(s): PROBNP in the last 8760 hours.  CBG: No results for input(s): GLUCAP in the last 168 hours.  Radiological Exams on Admission: No results found.  EKG: Independently reviewed.  Assessment/Plan Active Problems:   COPD exacerbation ([place])   1. Lung Mass -concern for malignancy -will need airway evaluation -right now wants to go home and will follow up as an outpatient -I have stressed to him that it is very important that he come into the office and get the bronch setup as this may very well be malignancy that we are dealing with.  2. Post-obstructive pneumonia -continue with abx treatment as ordered   Code Status: full code  Family Communication: none Disposition Plan: home  Time spent: 36mn    I have personally obtained a history, examined the patient, evaluated laboratory and imaging results, formulated the assessment and plan and placed orders.  The Patient requires high complexity decision making for assessment and support.    SAllyne Gee MD FNorthwest Hills Surgical HospitalPulmonary Critical Care Medicine Sleep Medicine

## 2016-06-26 NOTE — Progress Notes (Signed)
Alert and oriented. Denies pain. Pt ambulated around nurses station x1 without getting short of breath. Rested quietly during the night. No acute distress noted. Staff will continue to monitor

## 2016-06-27 NOTE — Discharge Summary (Signed)
[facility] [place] NAME: Ruben [name]    MR#:  [id number]  DATE OF BIRTH:  Aug 05, 1955  DATE OF ADMISSION:  06/23/2016   ADMITTING PHYSICIAN: Ruben Lighter, MD  DATE OF DISCHARGE: 06/26/2016 12:09 PM  PRIMARY CARE PHYSICIAN: Ruben Noble, MD   ADMISSION DIAGNOSIS:   Shortness of breath [R06.02] Lung mass [R91.8] Community acquired pneumonia of right lung, unspecified part of lung [J18.9]  DISCHARGE DIAGNOSIS:   Active Problems:   COPD exacerbation ([place])   SECONDARY DIAGNOSIS:   Past Medical History:  Diagnosis Date  . COPD (chronic obstructive pulmonary disease) ([place])   . Depression   . Stroke [facility])     HOSPITAL COURSE:   Ruben [name] a 61 y.o. malewith a known history of COPD not on home oxygen, history of stroke without any residual neurological deficits, depression presents from home secondary to worsening shortness of breath and cough going on for almost 3 days now.  #1 CAP- received rocephin and azithromycin in the hospital- discharged on augmentin There is a concern for postobstructive component as there is a right lung mass in the middle bronchus and consolidation distal to it - pt wishes to have bronch as out patient - Information of pulmonologist given for bronch as outpatient  #2 acute on chronic COPD exacerbation-not on home oxygen, off o2 as improving clinically -.discharged on prednisone taper, cough meds and inhalers  #3 depression and anxiety-continue home medications. On lexapro, klonopin and trazodone  #4 tobacco use disorder-counseled on admission.   #5 GERD- on PPI  #6 HTN- continue norvasc  Stable for discharge today  DISCHARGE CONDITIONS:   Guarded  CONSULTS OBTAINED:   Treatment Team:  Ruben Pian, MD Ruben Gee, MD  DRUG ALLERGIES:   No Known Allergies DISCHARGE MEDICATIONS:   Allergies as of 06/26/2016   No Known Allergies     Medication List      STOP taking these medications   levofloxacin 750 MG tablet Commonly known as:  LEVAQUIN   oseltamivir 75 MG capsule Commonly known as:  TAMIFLU   predniSONE 50 MG tablet Commonly known as:  DELTASONE Replaced by:  predniSONE 10 MG (21) Tbpk tablet   sertraline 100 MG tablet Commonly known as:  ZOLOFT     TAKE these medications   ADVAIR DISKUS 250-50 MCG/DOSE Aepb Generic drug:  Fluticasone-Salmeterol Inhale 1 puff into the lungs 2 (two) times daily.   albuterol 108 (90 Base) MCG/ACT inhaler Commonly known as:  PROVENTIL HFA;VENTOLIN HFA Inhale 2 puffs into the lungs every 6 (six) hours as needed for wheezing or shortness of breath.   amLODipine 10 MG tablet Commonly known as:  NORVASC Take 1 tablet (10 mg total) by mouth every morning.   amoxicillin-clavulanate 875-125 MG tablet Commonly known as:  AUGMENTIN Take 1 tablet by mouth 2 (two) times daily. X 7 more days   benzonatate 100 MG capsule Commonly known as:  TESSALON Take 1 capsule (100 mg total) by mouth 3 (three) times daily.   clonazePAM 0.5 MG tablet Commonly known as:  KLONOPIN Take 0.5 mg by mouth 2 (two) times daily as needed. For anxiety   escitalopram 20 MG tablet Commonly known as:  LEXAPRO Take 1 tablet (20 mg total) by mouth every morning.   guaiFENesin 600 MG 12 hr tablet Commonly known as:  MUCINEX Take 1 tablet (600 mg total) by mouth 2 (two) times daily.   ipratropium-albuterol 0.5-2.5 (3) MG/3ML Soln  Commonly known as:  DUONEB Take 3 mLs by nebulization 4 (four) times daily as needed. For shortness of breath/wheezing.   mirtazapine 15 MG tablet Commonly known as:  REMERON Take 1 tablet (15 mg total) by mouth at bedtime. What changed:  how much to take   nicotine 21 mg/24hr patch Commonly known as:  NICODERM CQ - dosed in mg/24 hours Place 1 patch (21 mg total) onto the skin daily.   omeprazole 40 MG capsule Commonly known as:  PRILOSEC Take 40 mg by mouth daily.   predniSONE 10  MG (21) Tbpk tablet Commonly known as:  STERAPRED UNI-PAK 21 TAB 6 tabs PO x 1 day 5 tabs PO x 1 day 4 tabs PO x 1 day 3 tabs PO x 1 day 2 tabs PO x 1 day 1 tab PO x 1 day and stop Replaces:  predniSONE 50 MG tablet   simvastatin 20 MG tablet Commonly known as:  ZOCOR Take 20 mg by mouth every morning.   traZODone 150 MG tablet Commonly known as:  DESYREL Take 150 mg by mouth at bedtime.        DISCHARGE INSTRUCTIONS:   1. PCP f/u in 1 week 2. Pulmonary f/y in 1 week for outpatient bronchoscopy  DIET:   Cardiac diet  ACTIVITY:   Activity as tolerated  OXYGEN:   Home Oxygen: No.  Oxygen Delivery: room air  DISCHARGE LOCATION:   home   If you experience worsening of your admission symptoms, develop shortness of breath, life threatening emergency, suicidal or homicidal thoughts you must seek medical attention immediately by calling 911 or calling your MD immediately  if symptoms less severe.  You Must read complete instructions/literature along with all the possible adverse reactions/side effects for all the Medicines you take and that have been prescribed to you. Take any new Medicines after you have completely understood and accpet all the possible adverse reactions/side effects.   Please note  You were cared for by a hospitalist during your hospital stay. If you have any questions about your discharge medications or the care you received while you were in the hospital after you are discharged, you can call the unit and asked to speak with the hospitalist on call if the hospitalist that took care of you is not available. Once you are discharged, your primary care physician will handle any further medical issues. Please note that NO REFILLS for any discharge medications will be authorized once you are discharged, as it is imperative that you return to your primary care physician (or establish a relationship with a primary care physician if you do not have one) for your  aftercare needs so that they can reassess your need for medications and monitor your lab values.    On the day of Discharge:  VITAL SIGNS:   Blood pressure (!) 141/68, pulse 87, temperature 97.5 F (36.4 C), temperature source Oral, resp. rate 18, height '5\' 10"'$  (1.778 m), weight 70.8 kg (156 lb), SpO2 96 %.  PHYSICAL EXAMINATION:    GENERAL:  61 y.o.-year-old patient lying in the bed with no acute distress.  EYES: Pupils equal, round, reactive to light and accommodation. No scleral icterus. Extraocular muscles intact.  HEENT: Head atraumatic, normocephalic. Oropharynx and nasopharynx clear.  NECK:  Supple, no jugular venous distention. No thyroid enlargement, no tenderness.  LUNGS: normal  breath sounds bilaterally, improved wheezing, no rales,rhonchi or crepitation. No use of accessory muscles of respiration.  CARDIOVASCULAR: S1, S2 normal. No murmurs, rubs, or  gallops.  ABDOMEN: Soft, nontender, nondistended. Bowel sounds present. No organomegaly or mass.  EXTREMITIES: No pedal edema, cyanosis, or clubbing.  NEUROLOGIC: Cranial nerves II through XII are intact. Muscle strength 5/5 in all extremities. Sensation intact. Gait not checked.  PSYCHIATRIC: The patient is alert and oriented x 3.  SKIN: No obvious rash, lesion, or ulcer.  DATA REVIEW:   CBC  Recent Labs Lab 06/25/16 0345  WBC 15.6*  HGB 12.2*  HCT 35.6*  PLT 246    Chemistries   Recent Labs Lab 06/25/16 0345  NA 140  K 3.8  CL 107  CO2 28  GLUCOSE 154*  BUN 19  CREATININE 0.69  CALCIUM 9.0     Microbiology Results  Results for orders placed or performed during the hospital encounter of 06/23/16  Blood culture (routine x 2)     Status: None (Preliminary result)   Collection Time: 06/23/16  4:27 PM  Result Value Ref Range Status   Specimen Description BLOOD LEFT AC  Final   Special Requests BOTTLES DRAWN AEROBIC AND ANAEROBIC BCAV  Final   Culture NO GROWTH 4 DAYS  Final   Report Status PENDING   Incomplete  Blood culture (routine x 2)     Status: None (Preliminary result)   Collection Time: 06/23/16  4:28 PM  Result Value Ref Range Status   Specimen Description BLOOD RIGHT AC  Final   Special Requests BOTTLES DRAWN AEROBIC AND ANAEROBIC BCAV  Final   Culture NO GROWTH 4 DAYS  Final   Report Status PENDING  Incomplete    RADIOLOGY:  No results found.   Management plans discussed with the patient, family and they are in agreement.  CODE STATUS:  Code Status History    Date Active Date Inactive Code Status Order ID Comments User Context   06/23/2016  6:56 PM 06/26/2016  3:14 PM Full Code [id number]  Ruben Lighter, MD Inpatient   06/28/2015  1:09 PM 07/01/2015  7:36 PM Full Code [id number]  Bettey Costa, MD Inpatient   06/28/2015 11:09 AM 06/28/2015 11:15 AM DNR [id number]  Bettey Costa, MD ED   09/25/2014  4:34 AM 09/26/2014  2:41 PM DNR [id number]  Lance Coon, MD Inpatient      TOTAL TIME TAKING CARE OF THIS PATIENT: 38 minutes.    Ruben [name] M.D on 06/27/2016 at 12:23 PM  Between 7am to 6pm - Pager - [phone]  After 6pm go to [email or web address] - Proofreader  [employer]  Hospitalists  Office  [phone]  CC: Primary care physician; Ruben Noble, MD   Note: This dictation was prepared with Dragon dictation along with smaller phrase technology. Any transcriptional errors that result from this proces s are unintentional.

## 2016-06-28 LAB — CULTURE, BLOOD (ROUTINE X 2)
CULTURE: NO GROWTH
Culture: NO GROWTH

## 2016-08-25 ENCOUNTER — Emergency Department: Payer: Medicaid Other

## 2016-08-25 ENCOUNTER — Inpatient Hospital Stay
Admission: EM | Admit: 2016-08-25 | Discharge: 2016-09-01 | DRG: 193 | Disposition: A | Discharge status: Home / Self Care | Payer: Medicaid Other | Attending: Internal Medicine | Admitting: Internal Medicine

## 2016-08-25 ENCOUNTER — Encounter: Payer: Self-pay | Admitting: Emergency Medicine

## 2016-08-25 DIAGNOSIS — I2510 Atherosclerotic heart disease of native coronary artery without angina pectoris: Secondary | ICD-10-CM | POA: Diagnosis present

## 2016-08-25 DIAGNOSIS — F329 Major depressive disorder, single episode, unspecified: Secondary | ICD-10-CM | POA: Diagnosis present

## 2016-08-25 DIAGNOSIS — I959 Hypotension, unspecified: Secondary | ICD-10-CM | POA: Diagnosis not present

## 2016-08-25 DIAGNOSIS — I10 Essential (primary) hypertension: Secondary | ICD-10-CM | POA: Diagnosis present

## 2016-08-25 DIAGNOSIS — Z6820 Body mass index (BMI) 20.0-20.9, adult: Secondary | ICD-10-CM

## 2016-08-25 DIAGNOSIS — Z79899 Other long term (current) drug therapy: Secondary | ICD-10-CM | POA: Diagnosis not present

## 2016-08-25 DIAGNOSIS — R590 Localized enlarged lymph nodes: Secondary | ICD-10-CM | POA: Diagnosis not present

## 2016-08-25 DIAGNOSIS — I252 Old myocardial infarction: Secondary | ICD-10-CM | POA: Diagnosis not present

## 2016-08-25 DIAGNOSIS — F17210 Nicotine dependence, cigarettes, uncomplicated: Secondary | ICD-10-CM | POA: Diagnosis present

## 2016-08-25 DIAGNOSIS — R911 Solitary pulmonary nodule: Secondary | ICD-10-CM | POA: Diagnosis not present

## 2016-08-25 DIAGNOSIS — J441 Chronic obstructive pulmonary disease with (acute) exacerbation: Secondary | ICD-10-CM | POA: Diagnosis present

## 2016-08-25 DIAGNOSIS — E785 Hyperlipidemia, unspecified: Secondary | ICD-10-CM | POA: Diagnosis present

## 2016-08-25 DIAGNOSIS — Y95 Nosocomial condition: Secondary | ICD-10-CM | POA: Diagnosis present

## 2016-08-25 DIAGNOSIS — R64 Cachexia: Secondary | ICD-10-CM | POA: Diagnosis present

## 2016-08-25 DIAGNOSIS — J9811 Atelectasis: Secondary | ICD-10-CM | POA: Diagnosis present

## 2016-08-25 DIAGNOSIS — R918 Other nonspecific abnormal finding of lung field: Secondary | ICD-10-CM

## 2016-08-25 DIAGNOSIS — J9621 Acute and chronic respiratory failure with hypoxia: Secondary | ICD-10-CM | POA: Diagnosis present

## 2016-08-25 DIAGNOSIS — J189 Pneumonia, unspecified organism: Principal | ICD-10-CM | POA: Diagnosis present

## 2016-08-25 DIAGNOSIS — R0602 Shortness of breath: Secondary | ICD-10-CM | POA: Diagnosis present

## 2016-08-25 DIAGNOSIS — Z5309 Procedure and treatment not carried out because of other contraindication: Secondary | ICD-10-CM | POA: Diagnosis present

## 2016-08-25 DIAGNOSIS — F419 Anxiety disorder, unspecified: Secondary | ICD-10-CM | POA: Diagnosis present

## 2016-08-25 DIAGNOSIS — C3490 Malignant neoplasm of unspecified part of unspecified bronchus or lung: Secondary | ICD-10-CM | POA: Diagnosis present

## 2016-08-25 DIAGNOSIS — I481 Persistent atrial fibrillation: Secondary | ICD-10-CM | POA: Diagnosis not present

## 2016-08-25 DIAGNOSIS — J181 Lobar pneumonia, unspecified organism: Secondary | ICD-10-CM | POA: Diagnosis not present

## 2016-08-25 DIAGNOSIS — I4891 Unspecified atrial fibrillation: Secondary | ICD-10-CM | POA: Diagnosis not present

## 2016-08-25 DIAGNOSIS — Z66 Do not resuscitate: Secondary | ICD-10-CM | POA: Diagnosis not present

## 2016-08-25 DIAGNOSIS — J440 Chronic obstructive pulmonary disease with acute lower respiratory infection: Secondary | ICD-10-CM | POA: Diagnosis present

## 2016-08-25 DIAGNOSIS — Z8673 Personal history of transient ischemic attack (TIA), and cerebral infarction without residual deficits: Secondary | ICD-10-CM | POA: Diagnosis not present

## 2016-08-25 HISTORY — DX: Acute myocardial infarction, unspecified: I21.9

## 2016-08-25 HISTORY — DX: Atherosclerotic heart disease of native coronary artery without angina pectoris: I25.10

## 2016-08-25 HISTORY — DX: Essential (primary) hypertension: I10

## 2016-08-25 LAB — BASIC METABOLIC PANEL
ANION GAP: 10 (ref 5–15)
BUN: 15 mg/dL (ref 6–20)
CO2: 29 mmol/L (ref 22–32)
Calcium: 9 mg/dL (ref 8.9–10.3)
Chloride: 100 mmol/L — ABNORMAL LOW (ref 101–111)
Creatinine, Ser: 0.96 mg/dL (ref 0.61–1.24)
GFR calc Af Amer: >60 mL/min (ref >60)
GFR calc non Af Amer: >60 mL/min (ref >60)
Glucose, Bld: 138 mg/dL — ABNORMAL HIGH (ref 65–99)
Potassium: 3.5 mmol/L (ref 3.5–5.1)
SODIUM: 139 mmol/L (ref 135–145)

## 2016-08-25 LAB — CREATININE, SERUM
Creatinine, Ser: 0.89 mg/dL (ref 0.61–1.24)
GFR calc Af Amer: >60 mL/min (ref >60)
GFR calc non Af Amer: >60 mL/min (ref >60)

## 2016-08-25 LAB — CBC
HEMATOCRIT: 39.4 % — AB (ref 40.0–52.0)
HEMATOCRIT: 38.8 % — AB (ref 40.0–52.0)
HEMOGLOBIN: 13.1 g/dL (ref 13.0–18.0)
Hemoglobin: 13.4 g/dL (ref 13.0–18.0)
MCH: 28.2 pg (ref 26.0–34.0)
MCH: 28.4 pg (ref 26.0–34.0)
MCHC: 34 g/dL (ref 32.0–36.0)
MCHC: 33.8 g/dL (ref 32.0–36.0)
MCV: 82.9 fL (ref 80.0–100.0)
MCV: 83.9 fL (ref 80.0–100.0)
Platelets: 241 10*3/uL (ref 150–440)
Platelets: 225 10*3/uL (ref 150–440)
RBC: 4.76 MIL/uL (ref 4.40–5.90)
RBC: 4.62 MIL/uL (ref 4.40–5.90)
RDW: 13.7 % (ref 11.5–14.5)
RDW: 13.9 % (ref 11.5–14.5)
WBC: 10.4 10*3/uL (ref 3.8–10.6)
WBC: 8.3 10*3/uL (ref 3.8–10.6)

## 2016-08-25 LAB — BLOOD GAS, VENOUS
ACID-BASE EXCESS: 5.7 mmol/L — AB (ref 0.0–2.0)
Bicarbonate: 32.1 mmol/L — ABNORMAL HIGH (ref 20.0–28.0)
O2 Saturation: 60.9 %
PCO2 VEN: 53 mmHg (ref 44.0–60.0)
PH VEN: 7.39 (ref 7.250–7.430)
Patient temperature: 37
pO2, Ven: 32 mmHg (ref 32.0–45.0)

## 2016-08-25 LAB — GLUCOSE, CAPILLARY: Glucose-Capillary: 182 mg/dL — ABNORMAL HIGH (ref 65–99)

## 2016-08-25 LAB — MRSA PCR SCREENING: MRSA BY PCR: NEGATIVE

## 2016-08-25 LAB — TROPONIN I: Troponin I: <0.03 ng/mL (ref <0.03)

## 2016-08-25 MED ORDER — PIPERACILLIN-TAZOBACTAM 3.375 G IVPB
3.3750 g | Freq: Three times a day (TID) | INTRAVENOUS | Status: DC
  Administered 2016-08-25: 3.375 g via INTRAVENOUS
  Filled 2016-08-25 (×4): qty 50

## 2016-08-25 MED ORDER — SODIUM CHLORIDE 0.9 % IV SOLN: 250.0000 mL | INTRAVENOUS | Status: DC | PRN

## 2016-08-25 MED ORDER — ONDANSETRON HCL 4 MG PO TABS: 4.0000 mg | ORAL_TABLET | Freq: Four times a day (QID) | ORAL | Status: DC | PRN

## 2016-08-25 MED ORDER — IPRATROPIUM-ALBUTEROL 0.5-2.5 (3) MG/3ML IN SOLN
3.0000 mL | Freq: Once | RESPIRATORY_TRACT | Status: AC
  Administered 2016-08-25: 3 mL via RESPIRATORY_TRACT
  Filled 2016-08-25: qty 3

## 2016-08-25 MED ORDER — GUAIFENESIN ER 600 MG PO TB12
600.0000 mg | ORAL_TABLET | Freq: Two times a day (BID) | ORAL | Status: DC
  Administered 2016-08-25 – 2016-08-29 (×10): 600 mg via ORAL
  Filled 2016-08-25 (×10): qty 1

## 2016-08-25 MED ORDER — ONDANSETRON HCL 4 MG/2ML IJ SOLN: 4.0000 mg | Freq: Four times a day (QID) | INTRAMUSCULAR | Status: DC | PRN

## 2016-08-25 MED ORDER — CLONAZEPAM 0.5 MG PO TABS
0.5000 mg | ORAL_TABLET | Freq: Two times a day (BID) | ORAL | Status: DC | PRN
  Administered 2016-08-25 – 2016-09-01 (×4): 0.5 mg via ORAL
  Filled 2016-08-25 (×4): qty 1

## 2016-08-25 MED ORDER — AMOXICILLIN-POT CLAVULANATE 875-125 MG PO TABS
1.0000 | ORAL_TABLET | Freq: Two times a day (BID) | ORAL | Status: DC
  Administered 2016-08-25 – 2016-09-01 (×14): 1 via ORAL
  Filled 2016-08-25 (×14): qty 1

## 2016-08-25 MED ORDER — ACETAMINOPHEN 325 MG PO TABS: 650.0000 mg | ORAL_TABLET | Freq: Four times a day (QID) | ORAL | Status: DC | PRN

## 2016-08-25 MED ORDER — ORAL CARE MOUTH RINSE: 15.0000 mL | Freq: Two times a day (BID) | OROMUCOSAL | Status: DC

## 2016-08-25 MED ORDER — SIMVASTATIN 20 MG PO TABS
20.0000 mg | ORAL_TABLET | ORAL | Status: DC
  Administered 2016-08-26 – 2016-09-01 (×8): 20 mg via ORAL
  Filled 2016-08-25 (×9): qty 1

## 2016-08-25 MED ORDER — LORAZEPAM 2 MG/ML IJ SOLN
1.0000 mg | Freq: Once | INTRAMUSCULAR | Status: AC
  Administered 2016-08-25: 1 mg via INTRAVENOUS
  Filled 2016-08-25: qty 1

## 2016-08-25 MED ORDER — AMLODIPINE BESYLATE 10 MG PO TABS
10.0000 mg | ORAL_TABLET | ORAL | Status: DC
  Administered 2016-08-25 – 2016-08-28 (×5): 10 mg via ORAL
  Filled 2016-08-25 (×6): qty 1

## 2016-08-25 MED ORDER — IPRATROPIUM-ALBUTEROL 0.5-2.5 (3) MG/3ML IN SOLN
3.0000 mL | Freq: Four times a day (QID) | RESPIRATORY_TRACT | Status: DC
  Administered 2016-08-25 – 2016-09-01 (×25): 3 mL via RESPIRATORY_TRACT
  Filled 2016-08-25 (×29): qty 3

## 2016-08-25 MED ORDER — PIPERACILLIN-TAZOBACTAM 3.375 G IVPB 30 MIN
3.3750 g | Freq: Once | INTRAVENOUS | Status: AC
  Administered 2016-08-25: 3.375 g via INTRAVENOUS
  Filled 2016-08-25: qty 50

## 2016-08-25 MED ORDER — CHLORHEXIDINE GLUCONATE 0.12 % MT SOLN
15.0000 mL | Freq: Two times a day (BID) | OROMUCOSAL | Status: DC
  Administered 2016-08-25 – 2016-09-01 (×13): 15 mL via OROMUCOSAL
  Filled 2016-08-25 (×13): qty 15

## 2016-08-25 MED ORDER — SODIUM CHLORIDE 0.9% FLUSH
3.0000 mL | Freq: Two times a day (BID) | INTRAVENOUS | Status: DC
  Administered 2016-08-25 – 2016-09-01 (×14): 3 mL via INTRAVENOUS

## 2016-08-25 MED ORDER — TRAZODONE HCL 50 MG PO TABS
150.0000 mg | ORAL_TABLET | Freq: Every day | ORAL | Status: DC
  Administered 2016-08-25 – 2016-08-31 (×7): 150 mg via ORAL
  Filled 2016-08-25 (×7): qty 1

## 2016-08-25 MED ORDER — MIRTAZAPINE 15 MG PO TABS
15.0000 mg | ORAL_TABLET | Freq: Every day | ORAL | Status: DC
  Administered 2016-08-25 – 2016-08-31 (×7): 15 mg via ORAL
  Filled 2016-08-25 (×7): qty 1

## 2016-08-25 MED ORDER — VANCOMYCIN HCL IN DEXTROSE 1-5 GM/200ML-% IV SOLN
1000.0000 mg | Freq: Once | INTRAVENOUS | Status: AC
  Administered 2016-08-25: 1000 mg via INTRAVENOUS
  Filled 2016-08-25: qty 200

## 2016-08-25 MED ORDER — BENZONATATE 100 MG PO CAPS
100.0000 mg | ORAL_CAPSULE | Freq: Three times a day (TID) | ORAL | Status: DC
Start: 2016-08-25 — End: 2016-08-30
  Administered 2016-08-25 – 2016-08-29 (×15): 100 mg via ORAL
  Filled 2016-08-25 (×15): qty 1

## 2016-08-25 MED ORDER — PIPERACILLIN-TAZOBACTAM 4.5 G IVPB
4.5000 g | Freq: Three times a day (TID) | INTRAVENOUS | Status: DC
  Filled 2016-08-25 (×2): qty 100

## 2016-08-25 MED ORDER — NICOTINE 7 MG/24HR TD PT24
7.0000 mg | MEDICATED_PATCH | Freq: Every day | TRANSDERMAL | Status: DC
  Administered 2016-08-25 – 2016-08-29 (×5): 7 mg via TRANSDERMAL
  Filled 2016-08-25 (×5): qty 1

## 2016-08-25 MED ORDER — VANCOMYCIN HCL 10 G IV SOLR
1250.0000 mg | Freq: Two times a day (BID) | INTRAVENOUS | Status: DC
  Administered 2016-08-25: 1250 mg via INTRAVENOUS
  Filled 2016-08-25 (×2): qty 1250

## 2016-08-25 MED ORDER — ESCITALOPRAM OXALATE 10 MG PO TABS
20.0000 mg | ORAL_TABLET | ORAL | Status: DC
Start: 2016-08-25 — End: 2016-09-01
  Administered 2016-08-25 – 2016-09-01 (×9): 20 mg via ORAL
  Filled 2016-08-25 (×9): qty 2

## 2016-08-25 MED ORDER — ACETAMINOPHEN 650 MG RE SUPP
650.0000 mg | Freq: Four times a day (QID) | RECTAL | Status: DC | PRN
Start: 2016-08-25 — End: 2016-09-01

## 2016-08-25 MED ORDER — BUDESONIDE 0.5 MG/2ML IN SUSP
0.5000 mg | Freq: Two times a day (BID) | RESPIRATORY_TRACT | Status: DC
  Administered 2016-08-25 – 2016-09-01 (×14): 0.5 mg via RESPIRATORY_TRACT
  Filled 2016-08-25 (×13): qty 2

## 2016-08-25 MED ORDER — PANTOPRAZOLE SODIUM 40 MG PO TBEC
40.0000 mg | DELAYED_RELEASE_TABLET | Freq: Every day | ORAL | Status: DC
  Administered 2016-08-25 – 2016-09-01 (×8): 40 mg via ORAL
  Filled 2016-08-25 (×8): qty 1

## 2016-08-25 MED ORDER — ENOXAPARIN SODIUM 40 MG/0.4ML ~~LOC~~ SOLN
40.0000 mg | SUBCUTANEOUS | Status: DC
  Administered 2016-08-25 – 2016-08-29 (×5): 40 mg via SUBCUTANEOUS
  Filled 2016-08-25 (×5): qty 0.4

## 2016-08-25 MED ORDER — SODIUM CHLORIDE 0.9% FLUSH: 3.0000 mL | INTRAVENOUS | Status: DC | PRN

## 2016-08-25 MED ORDER — PIPERACILLIN-TAZOBACTAM 3.375 G IVPB
3.3750 g | Freq: Three times a day (TID) | INTRAVENOUS | Status: DC
  Filled 2016-08-25 (×3): qty 50

## 2016-08-25 MED ORDER — SENNOSIDES-DOCUSATE SODIUM 8.6-50 MG PO TABS
1.0000 | ORAL_TABLET | Freq: Every evening | ORAL | Status: DC | PRN
  Administered 2016-08-25 – 2016-08-27 (×2): 1 via ORAL
  Filled 2016-08-25 (×2): qty 1

## 2016-08-25 MED ORDER — METHYLPREDNISOLONE SODIUM SUCC 40 MG IJ SOLR
40.0000 mg | Freq: Three times a day (TID) | INTRAMUSCULAR | Status: DC
  Administered 2016-08-25 – 2016-08-26 (×4): 40 mg via INTRAVENOUS
  Filled 2016-08-25 (×4): qty 1

## 2016-08-25 MED ORDER — ALBUTEROL SULFATE (2.5 MG/3ML) 0.083% IN NEBU
2.5000 mg | INHALATION_SOLUTION | RESPIRATORY_TRACT | Status: DC | PRN
Start: 2016-08-25 — End: 2016-09-01
  Administered 2016-08-25 – 2016-08-29 (×3): 2.5 mg via RESPIRATORY_TRACT
  Filled 2016-08-25 (×3): qty 3

## 2016-08-25 NOTE — ED Notes (Addendum)
Pt sat 86%; explained to pt importance of replacing bipap; pt voices understanding and bipap replaced; ativan admin as ordered; sat return to 95%

## 2016-08-25 NOTE — ED Notes (Addendum)
RT in room to admin neb tx; pt refusing to wear bipap any longer, stating that it makes him feel worse; MD notified; bipap removed by RT and pt placed on [place] with neb treatments; informed pt that we will attempt to see how he tolerates it but may have to replace the bipap; pt voices understanding

## 2016-08-25 NOTE — Progress Notes (Signed)
Pt arrived via bed from ccu at approx 1400. Pt alert and oriented, sat 86% on 4LNC, lungs with wheezes throughout. o2 increased to 5lnc, pt is encouraged breath in through his nose, states he is only a little sob. This Probation officer paged Dr. Posey Pronto, received order for prn albuterol nebs.

## 2016-08-25 NOTE — ED Provider Notes (Signed)
[facility] Emergency Department Provider Note   ____________________________________________   First MD Initiated Contact with Patient 08/25/16 0118     (approximate)  I have reviewed the triage vital signs and the nursing notes.   HISTORY  Chief Complaint Shortness of Breath    HPI Ruben [name] is a 61 y.o. male who comes into the hospital today with some shortness of breath and chest pain. The patient reports that this started on Friday. The patient reports that it is gotten worse since then. He has a history of COPD and had pneumonia 2 months ago. The patient has been using his inhalers but reports that it has not been helping. He also reports that he ran out about a week ago. He started using nebulizers but they have not been helping. The patient denies nausea but has been vomiting a few times. The patient rates his pain 8 out of 10 in his chest and he reports it feels like heaviness. The patient reports he could not last at home anymore serious outcomes in the hospital for evaluation.   Past Medical History:  Diagnosis Date  . COPD (chronic obstructive pulmonary disease) ([place])   . Depression   . Stroke [facility])     Patient Active Problem List   Diagnosis Date Noted  . Pneumonia 06/28/2015  . COPD with exacerbation ([place]) 09/25/2014  . Depression 09/25/2014  . COPD exacerbation ([place]) 09/25/2014  . Severe recurrent major depression without psychotic features ([place]) 09/25/2014    Past Surgical History:  Procedure Laterality Date  . ELBOW SURGERY Left   . WRIST SURGERY Left     Prior to Admission medications   Medication Sig Start Date End Date Taking? Authorizing Provider  ADVAIR DISKUS 250-50 MCG/DOSE AEPB Inhale 1 puff into the lungs 2 (two) times daily. 06/26/16   Gladstone Lighter, MD  albuterol (PROVENTIL HFA;VENTOLIN HFA) 108 (90 Base) MCG/ACT inhaler Inhale 2 puffs into the lungs every 6 (six) hours as needed for wheezing or shortness  of breath. 06/26/16   Gladstone Lighter, MD  amLODipine (NORVASC) 10 MG tablet Take 1 tablet (10 mg total) by mouth every morning. 06/26/16   Gladstone Lighter, MD  amoxicillin-clavulanate (AUGMENTIN) 875-125 MG tablet Take 1 tablet by mouth 2 (two) times daily. X 7 more days 06/26/16   Gladstone Lighter, MD  benzonatate (TESSALON) 100 MG capsule Take 1 capsule (100 mg total) by mouth 3 (three) times daily. 06/26/16   Gladstone Lighter, MD  clonazePAM (KLONOPIN) 0.5 MG tablet Take 0.5 mg by mouth 2 (two) times daily as needed. For anxiety 06/11/15   [provider]  escitalopram (LEXAPRO) 20 MG tablet Take 1 tablet (20 mg total) by mouth every morning. 06/26/16   Gladstone Lighter, MD  guaiFENesin (MUCINEX) 600 MG 12 hr tablet Take 1 tablet (600 mg total) by mouth 2 (two) times daily. 06/26/16   Gladstone Lighter, MD  ipratropium-albuterol (DUONEB) 0.5-2.5 (3) MG/3ML SOLN Take 3 mLs by nebulization 4 (four) times daily as needed. For shortness of breath/wheezing. 06/26/16   Gladstone Lighter, MD  mirtazapine (REMERON) 15 MG tablet Take 1 tablet (15 mg total) by mouth at bedtime. 06/26/16   Gladstone Lighter, MD  nicotine (NICODERM CQ - DOSED IN MG/24 HOURS) 21 mg/24hr patch Place 1 patch (21 mg total) onto the skin daily. Patient not taking: Reported on 06/23/2016 07/01/15   Fritzi Mandes, MD  omeprazole (PRILOSEC) 40 MG capsule Take 40 mg by mouth daily.  06/11/15   [provider]  predniSONE (STERAPRED UNI-PAK 21 TAB) 10 MG (21) TBPK tablet 6 tabs PO x 1 day 5 tabs PO x 1 day 4 tabs PO x 1 day 3 tabs PO x 1 day 2 tabs PO x 1 day 1 tab PO x 1 day and stop 06/26/16   Gladstone Lighter, MD  simvastatin (ZOCOR) 20 MG tablet Take 20 mg by mouth every morning. 06/11/15   [provider]  traZODone (DESYREL) 150 MG tablet Take 150 mg by mouth at bedtime.  06/11/15   [provider]    Allergies Patient has no known allergies.  Family History  Problem Relation Age of  Onset  . Heart disease Unknown   . Hypertension Unknown   . Diabetes Unknown   . CAD Mother     Social History Social History  Substance Use Topics  . Smoking status: Current Every Day Smoker    Packs/day: 1.00    Years: 43.00    Types: Cigarettes  . Smokeless tobacco: Never Used  . Alcohol use No    Review of Systems  Constitutional: No fever/chills Eyes: No visual changes. ENT: No sore throat. Cardiovascular:  chest pain. Respiratory:  shortness of breath. Gastrointestinal: Vomiting No abdominal pain.  No nausea,  No diarrhea.  No constipation. Genitourinary: Negative for dysuria. Musculoskeletal: Negative for back pain. Skin: Negative for rash. Neurological: Negative for headaches, focal weakness or numbness.   ____________________________________________   PHYSICAL EXAM:  VITAL SIGNS: ED Triage Vitals  Enc Vitals Group     BP 08/25/16 0115 133/74     Pulse Rate 08/25/16 0115 (!) 126     Resp 08/25/16 0115 (!) 28     Temp 08/25/16 0115 99.6 F (37.6 C)     Temp Source 08/25/16 0115 Oral     SpO2 08/25/16 0115 (!) 87 %     Weight 08/25/16 0211 180 lb (81.6 kg)     Height 08/25/16 0211 '5\' 9"'$  (1.753 m)     Head Circumference --      Peak Flow --      Pain Score 08/25/16 0120 8     Pain Loc --      Pain Edu? --      Excl. in [place]? --     Constitutional: Alert and oriented. Well appearing and in Moderate respiratory distress. Eyes: Conjunctivae are normal. PERRL. EOMI. Head: Atraumatic. Nose: No congestion/rhinnorhea. Mouth/Throat: Mucous membranes are moist.  Oropharynx non-erythematous. Cardiovascular: Tachycardia, regular rhythm. Grossly normal heart sounds.  Good peripheral circulation. Respiratory: Increased respiratory effort.   retractions. Diminished breath sounds throughout. Gastrointestinal: Soft and nontender. No distention. Positive bowel sounds Musculoskeletal: No lower extremity tenderness nor edema.   Neurologic:  Normal speech and  language.  Skin:  Skin is warm, dry and intact.  Psychiatric: Mood and affect are normal.   ____________________________________________   LABS (all labs ordered are listed, but only abnormal results are displayed)  Labs Reviewed  CBC - Abnormal; Notable for the following:       Result Value   HCT 39.4 (*)    All other components within normal limits  BASIC METABOLIC PANEL - Abnormal; Notable for the following:    Chloride 100 (*)    Glucose, Bld 138 (*)    All other components within normal limits  BLOOD GAS, VENOUS - Abnormal; Notable for the following:    Bicarbonate 32.1 (*)    Acid-Base Excess 5.7 (*)    All other components within normal limits  GLUCOSE, CAPILLARY - Abnormal; Notable for the following:    Glucose-Capillary 182 (*)    All other components within normal limits  MRSA PCR SCREENING  TROPONIN I  CBC  CREATININE, SERUM   ____________________________________________  EKG  ED ECG REPORT I, Loney Hering, the attending physician, personally viewed and interpreted this ECG.   Date: 08/25/2016  EKG Time: 126  Rate: 123  Rhythm: sinus tachycardia  Axis: left axis deviation  Intervals:none  ST&T Change: none  ____________________________________________  RADIOLOGY  Chest x-ray ____________________________________________   PROCEDURES  Procedure(s) performed: None  Procedures  Critical Care performed: Yes, see critical care note(s)  CRITICAL CARE Performed by: Charlesetta Ivory P   Total critical care time: 30 minutes  Critical care time was exclusive of separately billable procedures and treating other patients.  Critical care was necessary to treat or prevent imminent or life-threatening deterioration.  Critical care was time spent personally by me on the following activities: development of treatment plan with patient and/or surrogate as well as nursing, discussions with consultants, evaluation of patient's response to  treatment, examination of patient, obtaining history from patient or surrogate, ordering and performing treatments and interventions, ordering and review of laboratory studies, ordering and review of radiographic studies, pulse oximetry and re-evaluation of patient's condition.  ____________________________________________   INITIAL IMPRESSION / ASSESSMENT AND PLAN / ED COURSE  Pertinent labs & imaging results that were available during my care of the patient were reviewed by me and considered in my medical decision making (see chart for details).  This is a 72-year-old male who comes into the hospital today with some shortness of breath and chest pain. The patient is tachycardic and has some hypoxia when not on a nonrebreather. I did place the patient on BiPAP. He does have a right middle lobe infiltrate which she did have 2 months ago and although it is improved aeration I feel that this may be a new infiltrate given the patient's symptoms. The patient does have a cough but does not have any fevers. Since the patient was admitted to the hospital 2 months ago I will treat him with some vancomycin and Zosyn. The patient was placed on BiPAP because he was having a hard time keeping up his oxygenation. He did become frustrated on the BiPAP we did give him a milligram of Ativan to help him relax. The patient was admitted to the hospitalist service for further evaluation of his pneumonia and hypoxia.  Clinical Course as of Aug 26 642  Wed Aug 25, 2016  0214 1. Improved aeration of the right middle lobe with mild residual opacity. 2. Hyperinflated lungs consistent with COPD.   DG Chest Portable 1 View [AW]    Clinical Course User Index [AW] Loney Hering, MD     ____________________________________________   FINAL CLINICAL IMPRESSION(S) / ED DIAGNOSES  Final diagnoses:  SOB (shortness of breath)  COPD exacerbation ([place])  Healthcare-associated pneumonia      NEW MEDICATIONS  STARTED DURING THIS VISIT:  Current Discharge Medication List       Note:  This document was prepared using Dragon voice recognition software and may include unintentional dictation errors.    Loney Hering, MD 08/25/16 [phone]

## 2016-08-25 NOTE — ED Notes (Signed)
Pt resting quietly now in darkened exam room with eyes closed; resp even/unlab

## 2016-08-25 NOTE — Progress Notes (Addendum)
[place] [facility] [place] NAME: Ruben [name]    MR#:  [id number]  DATE OF BIRTH:  1956/02/27  SUBJECTIVE:    REVIEW OF SYSTEMS:   Review of Systems  Constitutional: Positive for weight loss. Negative for chills and fever.  HENT: Negative for ear discharge, ear pain and nosebleeds.   Eyes: Negative for blurred vision, pain and discharge.  Respiratory: Positive for cough, sputum production and shortness of breath. Negative for wheezing and stridor.   Cardiovascular: Negative for chest pain, palpitations, orthopnea and PND.  Gastrointestinal: Negative for abdominal pain, diarrhea, nausea and vomiting.  Genitourinary: Negative for frequency and urgency.  Musculoskeletal: Negative for back pain and joint pain.  Neurological: Positive for weakness. Negative for sensory change, speech change and focal weakness.  Psychiatric/Behavioral: Negative for depression and hallucinations. The patient is not nervous/anxious.    Tolerating Diet: Tolerating PT:   DRUG ALLERGIES:  No Known Allergies  VITALS:  Blood pressure 124/72, pulse (!) 108, temperature 97.9 F (36.6 C), temperature source Oral, resp. rate (!) 25, height '5\' 11"'$  (1.803 m), weight 65.7 kg (144 lb 13.5 oz), SpO2 93 %.  PHYSICAL EXAMINATION:   Physical Exam  GENERAL:  61 y.o.-year-old patient lying in the bed with no acute distress. Cachectic, thin EYES: Pupils equal, round, reactive to light and accommodation. No scleral icterus. Extraocular muscles intact.  HEENT: Head atraumatic, normocephalic. Oropharynx and nasopharynx clear.  NECK:  Supple, no jugular venous distention. No thyroid enlargement, no tenderness.  LUNGS: distant breath sounds bilaterally, no wheezing, rales, rhonchi.some use of accessory muscles of respiration.  CARDIOVASCULAR: S1, S2 normal. No murmurs, rubs, or gallops.  ABDOMEN: Soft, nontender, nondistended. Bowel sounds present. No organomegaly or mass.   EXTREMITIES: No cyanosis, clubbing or edema b/l.    NEUROLOGIC: Cranial nerves II through XII are intact. No focal Motor or sensory deficits b/l.   PSYCHIATRIC:  patient is alert and oriented x 3.  SKIN: No obvious rash, lesion, or ulcer.   LABORATORY PANEL:  CBC  Recent Labs Lab 08/25/16 0715  WBC 8.3  HGB 13.1  HCT 38.8*  PLT 225    Chemistries   Recent Labs Lab 08/25/16 0129 08/25/16 0715  NA 139  --   K 3.5  --   CL 100*  --   CO2 29  --   GLUCOSE 138*  --   BUN 15  --   CREATININE 0.96 0.89  CALCIUM 9.0  --    Cardiac Enzymes  Recent Labs Lab 08/25/16 0129  TROPONINI <0.03   RADIOLOGY:  Dg Chest Portable 1 View  Result Date: 08/25/2016 CLINICAL DATA:  Chest pain and shortness of breath EXAM: PORTABLE CHEST 1 VIEW COMPARISON:  Chest radiograph 06/23/2016 FINDINGS: There is improved aeration of the right middle lobe, though there is still some residual opacity. The lungs are hyperinflated with diffusely increased pulmonary markings. No pleural effusion or pneumothorax. No new area of consolidation. No pulmonary edema. Normal cardiomediastinal contours. IMPRESSION: 1. Improved aeration of the right middle lobe with mild residual opacity. 2. Hyperinflated lungs consistent with COPD. Electronically Signed   By: Ulyses Jarred M.D.   On: 08/25/2016 02:05   ASSESSMENT AND PLAN:  Ruben [name]  is a 61 y.o. male with a known history of CVA, COPD presented to the emergency room with increased shortness of breath . Patient was brought by the EMS from home. He used 10 nebulizer treatments at home without relief and his oxygen  saturation was 68% on arrival  1. Acute on chronic hypoxic respiratory failure due to COPD exacerbation with ongoing Tobacco abuse -IV solumederol -weaned off BIPAP -pt has right sided atelectasis with possible right lung mass -Dr Humphrey Rolls to see. _CT chest ordered. May ned bronch -empiric abxs  2. Ongoing toabcoo abuse -advised smoking cessation  >4 mins spent  3. Disorientation due to hypoxemia-improving  4. Emphysema, chornic -assess for oxygen at home  5.HTN Resume amlodipine   Case discussed with Care Management/Social Worker. Management plans discussed with the patient, family and they are in agreement.  CODE STATUS: FULL  DVT Prophylaxis: lovenox  TOTAL TIME TAKING CARE OF THIS PATIENT: *30* minutes.  >50% time spent on counselling and coordination of care  POSSIBLE D/C IN 1-2* DAYS, DEPENDING ON CLINICAL CONDITION.  Note: This dictation was prepared with Dragon dictation along with smaller phrase technology. Any transcriptional errors that result from this process are unintentional.  Ruben [name] M.D on 08/25/2016 at 7:11 PM  Between 7am to 6pm - Pager - [phone]  After 6pm go to [email or web address] - password [email or web address] [facility]  Office  [phone]  CC: Primary care physician; Marden Noble, MD

## 2016-08-25 NOTE — H&P (Signed)
[place] at [place] NAME: Ruben [name]    MR#:  [id number]  DATE OF BIRTH:  [birth year]  DATE OF ADMISSION:  08/25/2016  PRIMARY CARE PHYSICIAN: Marden Noble, MD   REQUESTING/REFERRING PHYSICIAN:   CHIEF COMPLAINT:   Chief Complaint  Patient presents with  . Shortness of Breath    HISTORY OF PRESENT ILLNESS: Ruben [name]  is a 61 y.o. male with a known history of CVA, COPD presented to the emergency room with increased shortness of breath . Patient was brought by the EMS from home. He used 10 nebulizer treatments at home without relief and his oxygen saturation was 68% on arrival. Patient was initially put on nonrebreather mask with 15 L of oxygen. Patient continued to use of accessory muscles for respiration was short of breath and could not maintain oxygen saturation and was put on BiPAP. He stabilized on BiPAP and his O2 sats saturations improved to 94%. Later on patient refused BiPAP and try to remove BiPAP. Patient's oxygen saturation dropped to 85% on oxygen via nasal cannula. He was counseled again put back on the BiPAP and also was given Ativan IV 1 dose in the emergency room for agitation. Not much history could be obtained from the patient. He will drinks back to sleep in between verbal questions.  PAST MEDICAL HISTORY:   Past Medical History:  Diagnosis Date  . COPD (chronic obstructive pulmonary disease) ([place])   . Depression   . Stroke [facility])     PAST SURGICAL HISTORY: Past Surgical History:  Procedure Laterality Date  . ELBOW SURGERY Left   . WRIST SURGERY Left     SOCIAL HISTORY:  Social History  Substance Use Topics  . Smoking status: Current Every Day Smoker    Packs/day: 1.00    Years: 43.00    Types: Cigarettes  . Smokeless tobacco: Never Used  . Alcohol use No    FAMILY HISTORY:  Family History  Problem Relation Age of Onset  . Heart disease Unknown   . Hypertension Unknown   . Diabetes Unknown    . CAD Mother   complete family history could not be obtained as patient is lethargic with decreased responsiveness.  DRUG ALLERGIES: No Known Allergies  REVIEW OF SYSTEMS:  Could not be obtained as patient is lethargic and with decreased responsiveness  MEDICATIONS AT HOME:  Prior to Admission medications   Medication Sig Start Date End Date Taking? Authorizing Provider  ADVAIR DISKUS 250-50 MCG/DOSE AEPB Inhale 1 puff into the lungs 2 (two) times daily. 06/26/16   Gladstone Lighter, MD  albuterol (PROVENTIL HFA;VENTOLIN HFA) 108 (90 Base) MCG/ACT inhaler Inhale 2 puffs into the lungs every 6 (six) hours as needed for wheezing or shortness of breath. 06/26/16   Gladstone Lighter, MD  amLODipine (NORVASC) 10 MG tablet Take 1 tablet (10 mg total) by mouth every morning. 06/26/16   Gladstone Lighter, MD  amoxicillin-clavulanate (AUGMENTIN) 875-125 MG tablet Take 1 tablet by mouth 2 (two) times daily. X 7 more days 06/26/16   Gladstone Lighter, MD  benzonatate (TESSALON) 100 MG capsule Take 1 capsule (100 mg total) by mouth 3 (three) times daily. 06/26/16   Gladstone Lighter, MD  clonazePAM (KLONOPIN) 0.5 MG tablet Take 0.5 mg by mouth 2 (two) times daily as needed. For anxiety 06/11/15   [provider]  escitalopram (LEXAPRO) 20 MG tablet Take 1 tablet (20 mg total) by mouth every morning. 06/26/16   Gladstone Lighter, MD  guaiFENesin (MUCINEX) 600 MG 12 hr tablet Take 1 tablet (600 mg total) by mouth 2 (two) times daily. 06/26/16   Gladstone Lighter, MD  ipratropium-albuterol (DUONEB) 0.5-2.5 (3) MG/3ML SOLN Take 3 mLs by nebulization 4 (four) times daily as needed. For shortness of breath/wheezing. 06/26/16   Gladstone Lighter, MD  mirtazapine (REMERON) 15 MG tablet Take 1 tablet (15 mg total) by mouth at bedtime. 06/26/16   Gladstone Lighter, MD  nicotine (NICODERM CQ - DOSED IN MG/24 HOURS) 21 mg/24hr patch Place 1 patch (21 mg total) onto the skin daily. Patient not taking: Reported  on 06/23/2016 07/01/15   Fritzi Mandes, MD  omeprazole (PRILOSEC) 40 MG capsule Take 40 mg by mouth daily.  06/11/15   [provider]  predniSONE (STERAPRED UNI-PAK 21 TAB) 10 MG (21) TBPK tablet 6 tabs PO x 1 day 5 tabs PO x 1 day 4 tabs PO x 1 day 3 tabs PO x 1 day 2 tabs PO x 1 day 1 tab PO x 1 day and stop 06/26/16   Gladstone Lighter, MD  simvastatin (ZOCOR) 20 MG tablet Take 20 mg by mouth every morning. 06/11/15   [provider]  traZODone (DESYREL) 150 MG tablet Take 150 mg by mouth at bedtime.  06/11/15   [provider]      PHYSICAL EXAMINATION:   VITAL SIGNS: Blood pressure 139/71, pulse (!) 108, temperature 99.6 F (37.6 C), temperature source Oral, resp. rate (!) 32, height '5\' 9"'$  (1.753 m), weight 81.6 kg (180 lb), SpO2 94 %.  GENERAL:  61 y.o.-year-old patient lying in the bed on bipap EYES: Pupils equal, round, reactive to light and accommodation. No scleral icterus. Extraocular muscles intact.  HEENT: Head atraumatic, normocephalic. Oropharynx and nasopharynx clear.  NECK:  Supple, no jugular venous distention. No thyroid enlargement, no tenderness.  LUNGS: Decreased breath sounds bilaterally, rales heard in right lung. No use of accessory muscles of respiration.  CARDIOVASCULAR: S1, S2 normal. No murmurs, rubs, or gallops.  ABDOMEN: Soft, nontender, nondistended. Bowel sounds present. No organomegaly or mass.  EXTREMITIES: No pedal edema, cyanosis, or clubbing.  NEUROLOGIC: Arousable, responds to verbal commands but not completely oriented to time, place and person. PSYCHIATRIC: could not be assessed SKIN: No obvious rash, lesion, or ulcer.   LABORATORY PANEL:   CBC  Recent Labs Lab 08/25/16 0129  WBC 10.4  HGB 13.4  HCT 39.4*  PLT 241  MCV 82.9  MCH 28.2  MCHC 34.0  RDW 13.7   ------------------------------------------------------------------------------------------------------------------  Chemistries   Recent Labs Lab  08/25/16 0129  NA 139  K 3.5  CL 100*  CO2 29  GLUCOSE 138*  BUN 15  CREATININE 0.96  CALCIUM 9.0   ------------------------------------------------------------------------------------------------------------------ estimated creatinine clearance is 81.8 mL/min (by C-G formula based on SCr of 0.96 mg/dL). ------------------------------------------------------------------------------------------------------------------ No results for input(s): TSH, T4TOTAL, T3FREE, THYROIDAB in the last 72 hours.  Invalid input(s): FREET3   Coagulation profile No results for input(s): INR, PROTIME in the last 168 hours. ------------------------------------------------------------------------------------------------------------------- No results for input(s): DDIMER in the last 72 hours. -------------------------------------------------------------------------------------------------------------------  Cardiac Enzymes  Recent Labs Lab 08/25/16 0129  TROPONINI <0.03   ------------------------------------------------------------------------------------------------------------------ Invalid input(s): POCBNP  ---------------------------------------------------------------------------------------------------------------  Urinalysis    Component Value Date/Time   COLORURINE Yellow 06/01/2013 1013   APPEARANCEUR Clear 06/01/2013 1013   LABSPEC 1.017 06/01/2013 1013   PHURINE 5.0 06/01/2013 1013   GLUCOSEU Negative 06/01/2013 1013   HGBUR Negative 06/01/2013 1013   BILIRUBINUR Negative 06/01/2013 1013   KETONESUR  Negative 06/01/2013 1013   PROTEINUR 100 mg/dL 06/01/2013 1013   NITRITE Negative 06/01/2013 1013   LEUKOCYTESUR Negative 06/01/2013 1013     RADIOLOGY: Dg Chest Portable 1 View  Result Date: 08/25/2016 CLINICAL DATA:  Chest pain and shortness of breath EXAM: PORTABLE CHEST 1 VIEW COMPARISON:  Chest radiograph 06/23/2016 FINDINGS: There is improved aeration of the right middle  lobe, though there is still some residual opacity. The lungs are hyperinflated with diffusely increased pulmonary markings. No pleural effusion or pneumothorax. No new area of consolidation. No pulmonary edema. Normal cardiomediastinal contours. IMPRESSION: 1. Improved aeration of the right middle lobe with mild residual opacity. 2. Hyperinflated lungs consistent with COPD. Electronically Signed   By: Ulyses Jarred M.D.   On: 08/25/2016 02:05    EKG: Orders placed or performed during the hospital encounter of 08/25/16  . ED EKG  . ED EKG  . EKG 12-Lead  . EKG 12-Lead    IMPRESSION AND PLAN: 61 year old male patient with history of pneumonia, emphysema, CVA presented to the emergency room with shortness of breath and hypoxia. Admitting diagnosis 1. Acute hypoxic respiratory failure 2. Healthcare associated pneumonia 3. Disorientation 4. Emphysema Treatment plan Admit patient to stepdown unit Continue BiPAP for respiratory failure Start patient on IV vancomycin and IV Zosyn antibiotics Pulmonary and intensivist consultation Breathing treatments Supportive care.  All the records are reviewed and case discussed with ED provider. Management plans discussed with the patient, family and they are in agreement.  CODE STATUS:FULL CODE Code Status History    Date Active Date Inactive Code Status Order ID Comments User Context   06/23/2016  6:56 PM 06/26/2016  3:14 PM Full Code [id number]  Gladstone Lighter, MD Inpatient   06/28/2015  1:09 PM 07/01/2015  7:36 PM Full Code [id number]  Bettey Costa, MD Inpatient   06/28/2015 11:09 AM 06/28/2015 11:15 AM DNR [id number]  Bettey Costa, MD ED   09/25/2014  4:34 AM 09/26/2014  2:41 PM DNR [id number]  Lance Coon, MD Inpatient       TOTAL TIME TAKING CARE OF THIS PATIENT: 50 minutes.    Saundra Shelling M.D on 08/25/2016 at 5:54 AM  Between 7am to 6pm - Pager - [phone]  After 6pm go to [email or web address] - password [email or web address]  [facility]  Office  [phone]  CC: Primary care physician; Marden Noble, MD

## 2016-08-25 NOTE — ED Notes (Signed)
Radiology at bedside for CXR

## 2016-08-25 NOTE — ED Notes (Signed)
RT in room to place pt on bipap

## 2016-08-25 NOTE — Progress Notes (Signed)
Pharmacy Antibiotic Note  Ruben [name] is a 61 y.o. male admitted on 08/25/2016 with pneumonia.  Pharmacy has been consulted for vancomycin and Zosyn dosing.  Plan: DW 82kg  Vd 57L kei 0.072 hr-1  t1/2 10 hours Vancomycin 1250 mg q 12 hours ordered with stacked dosing. Level before 5th dose. Goal trough 15-20.  Zosyn 4.5 grams q 8 hours ordered for Pseudomonas risk of abx in last 3 months.  Height: '5\' 9"'$  (175.3 cm) Weight: 180 lb (81.6 kg) IBW/kg (Calculated) : 70.7  Temp (24hrs), Avg:99.6 F (37.6 C), Min:99.6 F (37.6 C), Max:99.6 F (37.6 C)   Recent Labs Lab 08/25/16 0129  WBC 10.4  CREATININE 0.96    Estimated Creatinine Clearance: 81.8 mL/min (by C-G formula based on SCr of 0.96 mg/dL).    No Known Allergies  Antimicrobials this admission: vancomycin Zosyn 5/16 >>    >>   Dose adjustments this admission:   Microbiology results: No micro      5/16 CXR: R mid lung residual opacity  Thank you for allowing pharmacy to be a part of this patient's care.  Colby Reels S 08/25/2016 6:17 AM

## 2016-08-25 NOTE — ED Triage Notes (Signed)
Pt to room 1 via stretcher/EMS from home with c/o [facility]; EMS reports pt used 10 duoneb at home without relief; oxim 68% upon arrival; placed on 15l/min via NRB, wheezes throughout, sat 94%, ST 130, end tidal 34; '125mg'$  solumedrol admin IV enroute; admitted recently for pneumonia and told could possibly have lung CA; pt with prolonged exp, use of accessory muscles; st since last Friday has had increasing SHOB with prod cough yellow sputum, worse when lying supine; +smoker; apical audible & regular, +BS, abd soft/nondist/nontender, +PP, -edema; pt also c/o mid CP, nonradiating; pt placed on card monitor; sat 87% on ra--placed on 5l/min O2 via [place]

## 2016-08-25 NOTE — Progress Notes (Signed)
Report called to [facility] [employer]

## 2016-08-25 NOTE — ED Notes (Signed)
Pt uprite on stretcher in exam room with eyes closed, no distress noted, no c/o voiced at present

## 2016-08-25 NOTE — Consult Note (Signed)
Pulmonary Critical Care  Initial Consult Note   Ruben [name] JKK:[id number] DOB: [birth year] DOA: 08/25/2016  Referring physician: Fritzi Mandes, MD PCP: Marden Noble, MD   Chief Complaint: Pneumonia  HPI: Ruben [name] is a 61 y.o. male known to me from previous admission who at that time was noted to have RML pneumonia with concern of post obstructive process. Patient was to follow up in the office however did not. Now seen in the ED for cough congestion shortness of breath with increased symptoms. CXR compared actually appears a little better from march but not resolved. I am asked to see for possible bronch   Review of Systems:  ROS performed and is unremarkable other than noted in HPI  Past Medical History:  Diagnosis Date  . COPD (chronic obstructive pulmonary disease) ([place])   . Depression   . Stroke [facility])    Past Surgical History:  Procedure Laterality Date  . ELBOW SURGERY Left   . WRIST SURGERY Left    Social History:  reports that he has been smoking Cigarettes.  He has a 43.00 pack-year smoking history. He has never used smokeless tobacco. He reports that he uses drugs, including Marijuana. He reports that he does not drink alcohol.  No Known Allergies  Family History  Problem Relation Age of Onset  . Heart disease Unknown   . Hypertension Unknown   . Diabetes Unknown   . CAD Mother     Prior to Admission medications   Medication Sig Start Date End Date Taking? Authorizing Provider  ADVAIR DISKUS 250-50 MCG/DOSE AEPB Inhale 1 puff into the lungs 2 (two) times daily. 06/26/16  Yes Gladstone Lighter, MD  albuterol (PROVENTIL HFA;VENTOLIN HFA) 108 (90 Base) MCG/ACT inhaler Inhale 2 puffs into the lungs every 6 (six) hours as needed for wheezing or shortness of breath. 06/26/16  Yes Gladstone Lighter, MD  amLODipine (NORVASC) 10 MG tablet Take 1 tablet (10 mg total) by mouth every morning. 06/26/16  Yes Gladstone Lighter, MD  clonazePAM (KLONOPIN) 0.5 MG tablet  Take 0.5 mg by mouth 2 (two) times daily as needed. For anxiety 06/11/15  Yes [provider]  escitalopram (LEXAPRO) 20 MG tablet Take 1 tablet (20 mg total) by mouth every morning. 06/26/16  Yes Gladstone Lighter, MD  guaiFENesin (MUCINEX) 600 MG 12 hr tablet Take 1 tablet (600 mg total) by mouth 2 (two) times daily. 06/26/16  Yes Gladstone Lighter, MD  ipratropium-albuterol (DUONEB) 0.5-2.5 (3) MG/3ML SOLN Take 3 mLs by nebulization 4 (four) times daily as needed. For shortness of breath/wheezing. 06/26/16  Yes Gladstone Lighter, MD  mirtazapine (REMERON) 15 MG tablet Take 1 tablet (15 mg total) by mouth at bedtime. Patient taking differently: Take 30 mg by mouth at bedtime.  06/26/16  Yes Gladstone Lighter, MD  omeprazole (PRILOSEC) 40 MG capsule Take 40 mg by mouth daily.  06/11/15  Yes [provider]  traZODone (DESYREL) 150 MG tablet Take 150 mg by mouth at bedtime.  06/11/15  Yes [provider]  benzonatate (TESSALON) 100 MG capsule Take 1 capsule (100 mg total) by mouth 3 (three) times daily. Patient not taking: Reported on 08/25/2016 06/26/16   Gladstone Lighter, MD  nicotine (NICODERM CQ - DOSED IN MG/24 HOURS) 21 mg/24hr patch Place 1 patch (21 mg total) onto the skin daily. Patient not taking: Reported on 06/23/2016 07/01/15   Fritzi Mandes, MD  simvastatin (ZOCOR) 20 MG tablet Take 20 mg by mouth every morning. 06/11/15   [provider]   Physical Exam: Vitals:   08/25/16 1200 08/25/16 1209 08/25/16 1300 08/25/16 1412  BP: 125/63  (!) 142/70 124/72  Pulse: 87  80 (!) 108  Resp: (!) 23  (!) 25   Temp: 98.2 F (36.8 C)   97.9 F (36.6 C)  TempSrc:    Oral  SpO2: 93% 93% (!) 89% 90%  Weight:      Height:        Wt Readings from Last 3 Encounters:  08/25/16 65.7 kg (144 lb 13.5 oz)  06/23/16 70.8 kg (156 lb)  06/28/15 81.6 kg (180 lb)    General:  Appears calm and comfortable Eyes: PERRL, normal lids, irises & conjunctiva ENT: grossly normal  hearing, lips & tongue Neck: no LAD, masses or thyromegaly Cardiovascular: RRR, no m/r/g. No LE edema. Respiratory:  Scattered rhonchi. Normal respiratory effort. Abdomen: soft, nontender Skin: no rash or induration seen on limited exam Musculoskeletal: grossly normal tone BUE/BLE Psychiatric: grossly normal mood and affect Neurologic: grossly non-focal.          Labs on Admission:  Basic Metabolic Panel:  Recent Labs Lab 08/25/16 0129 08/25/16 0715  NA 139  --   K 3.5  --   CL 100*  --   CO2 29  --   GLUCOSE 138*  --   BUN 15  --   CREATININE 0.96 0.89  CALCIUM 9.0  --    Liver Function Tests: No results for input(s): AST, ALT, ALKPHOS, BILITOT, PROT, ALBUMIN in the last 168 hours. No results for input(s): LIPASE, AMYLASE in the last 168 hours. No results for input(s): AMMONIA in the last 168 hours. CBC:  Recent Labs Lab 08/25/16 0129 08/25/16 0715  WBC 10.4 8.3  HGB 13.4 13.1  HCT 39.4* 38.8*  MCV 82.9 83.9  PLT 241 225   Cardiac Enzymes:  Recent Labs Lab 08/25/16 0129  TROPONINI <0.03    BNP (last 3 results) No results for input(s): BNP in the last 8760 hours.  ProBNP (last 3 results) No results for input(s): PROBNP in the last 8760 hours.  CBG:  Recent Labs Lab 08/25/16 0638  GLUCAP 182*    Radiological Exams on Admission: Dg Chest Portable 1 View  Result Date: 08/25/2016 CLINICAL DATA:  Chest pain and shortness of breath EXAM: PORTABLE CHEST 1 VIEW COMPARISON:  Chest radiograph 06/23/2016 FINDINGS: There is improved aeration of the right middle lobe, though there is still some residual opacity. The lungs are hyperinflated with diffusely increased pulmonary markings. No pleural effusion or pneumothorax. No new area of consolidation. No pulmonary edema. Normal cardiomediastinal contours. IMPRESSION: 1. Improved aeration of the right middle lobe with mild residual opacity. 2. Hyperinflated lungs consistent with COPD. Electronically Signed   By:  Ulyses Jarred M.D.   On: 08/25/2016 02:05    EKG: Independently reviewed.  Assessment/Plan Active Problems:   Pneumonia   1. RML Pneumonia -on he current CXR available from 5/16 compared with images from March there is an improvement in the consolidation -this does not however rule out a post obstructive process -he was to come in to the office but did not so now I am asked if he will do the bronch since he is in the hospital -will also get a follow up CT of the chest    Code Status: full code DVT Prophylaxis:lovenox Family Communication: none Disposition Plan: home  Time spent: 22mn    I have personally obtained a history, examined the patient, evaluated laboratory and  imaging results, formulated the assessment and plan and placed orders.  The Patient requires high complexity decision making for assessment and support.    Allyne Gee, MD [facility] Pulmonary Critical Care Medicine Sleep Medicine

## 2016-08-25 NOTE — Consult Note (Addendum)
Name: Ruben [name] MRN: [id number] DOB: 04-21-1955    ADMISSION DATE:  08/25/2016  CHIEF COMPLAINT:  Shortness of breath  CONSULTATION  DATE: 08/25/16  REFERRING MD: Dr. Estanislado Pandy   BRIEF PATIENT DESCRIPTION: 61 year old with Acute Respiratory Failure secondary to  right middle lobe PNA  SIGNIFICANT EVENTS  5/16 Patient admitted to the ICU with respiratory failure secondary to PNA requiring BiPAP  STUDIES:  None   HISTORY OF PRESENT ILLNESS:  Ruben [name] is a 61 year old male with known history of COPD,Depression and stroke.  Patient presents to [facility] with increased shortness of breath.  Patient had increased sputum production with use of accessory muscles. He used 10 nebulizer treatments at home without any releif. Patient was satting 68%.  Upon arrival to ED patient was placed on BiPAP. CXR was concerning for Right middle lobe PNA.  He received solumedrol '125mg'$  X1 and was started on broad spectrum antibiotics.  Patient was admitted by the hospitalist and was transferred to the ICU on BiPAP for closer monitoring.  PAST MEDICAL HISTORY :   has a past medical history of COPD (chronic obstructive pulmonary disease) ([place]); Depression; and Stroke [facility]).  has a past surgical history that includes Elbow surgery (Left) and Wrist surgery (Left). Prior to Admission medications   Medication Sig Start Date End Date Taking? Authorizing Provider  ADVAIR DISKUS 250-50 MCG/DOSE AEPB Inhale 1 puff into the lungs 2 (two) times daily. 06/26/16   Gladstone Lighter, MD  albuterol (PROVENTIL HFA;VENTOLIN HFA) 108 (90 Base) MCG/ACT inhaler Inhale 2 puffs into the lungs every 6 (six) hours as needed for wheezing or shortness of breath. 06/26/16   Gladstone Lighter, MD  amLODipine (NORVASC) 10 MG tablet Take 1 tablet (10 mg total) by mouth every morning. 06/26/16   Gladstone Lighter, MD  amoxicillin-clavulanate (AUGMENTIN) 875-125 MG tablet Take 1 tablet by mouth 2 (two) times daily. X 7 more days 06/26/16    Gladstone Lighter, MD  benzonatate (TESSALON) 100 MG capsule Take 1 capsule (100 mg total) by mouth 3 (three) times daily. 06/26/16   Gladstone Lighter, MD  clonazePAM (KLONOPIN) 0.5 MG tablet Take 0.5 mg by mouth 2 (two) times daily as needed. For anxiety 06/11/15   [provider]  escitalopram (LEXAPRO) 20 MG tablet Take 1 tablet (20 mg total) by mouth every morning. 06/26/16   Gladstone Lighter, MD  guaiFENesin (MUCINEX) 600 MG 12 hr tablet Take 1 tablet (600 mg total) by mouth 2 (two) times daily. 06/26/16   Gladstone Lighter, MD  ipratropium-albuterol (DUONEB) 0.5-2.5 (3) MG/3ML SOLN Take 3 mLs by nebulization 4 (four) times daily as needed. For shortness of breath/wheezing. 06/26/16   Gladstone Lighter, MD  mirtazapine (REMERON) 15 MG tablet Take 1 tablet (15 mg total) by mouth at bedtime. 06/26/16   Gladstone Lighter, MD  nicotine (NICODERM CQ - DOSED IN MG/24 HOURS) 21 mg/24hr patch Place 1 patch (21 mg total) onto the skin daily. Patient not taking: Reported on 06/23/2016 07/01/15   Fritzi Mandes, MD  omeprazole (PRILOSEC) 40 MG capsule Take 40 mg by mouth daily.  06/11/15   [provider]  predniSONE (STERAPRED UNI-PAK 21 TAB) 10 MG (21) TBPK tablet 6 tabs PO x 1 day 5 tabs PO x 1 day 4 tabs PO x 1 day 3 tabs PO x 1 day 2 tabs PO x 1 day 1 tab PO x 1 day and stop 06/26/16   Gladstone Lighter, MD  simvastatin (ZOCOR) 20 MG tablet Take 20  mg by mouth every morning. 06/11/15   [provider]  traZODone (DESYREL) 150 MG tablet Take 150 mg by mouth at bedtime.  06/11/15   [provider]   No Known Allergies  FAMILY HISTORY:  family history includes CAD in his mother. SOCIAL HISTORY:  reports that he has been smoking Cigarettes.  He has a 43.00 pack-year smoking history. He has never used smokeless tobacco. He reports that he uses drugs, including Marijuana. He reports that he does not drink alcohol.  REVIEW OF SYSTEMS:   Constitutional: Negative for  fever, chills, weight loss, malaise/fatigue and diaphoresis.  HENT: Negative for hearing loss, ear pain, nosebleeds, congestion, sore throat, neck pain, tinnitus and ear discharge.   Eyes: Negative for blurred vision, double vision, photophobia, pain, discharge and redness.  Respiratory: Negative for cough, hemoptysis, sputum production, shortness of breath, wheezing and stridor.   Cardiovascular: Negative for chest pain, palpitations, orthopnea, claudication, leg swelling and PND.  Gastrointestinal: Negative for heartburn, nausea, vomiting, abdominal pain, diarrhea, constipation, blood in stool and melena.  Genitourinary: Negative for dysuria, urgency, frequency, hematuria and flank pain.  Musculoskeletal: Negative for myalgias, back pain, joint pain and falls.  Skin: Negative for itching and rash.  Neurological: Negative for dizziness, tingling, tremors, sensory change, speech change, focal weakness, seizures, loss of consciousness, weakness and headaches.  Endo/Heme/Allergies: Negative for environmental allergies and polydipsia. Does not bruise/bleed easily.  SUBJECTIVE: Patient is resting with Bipap on  VITAL SIGNS: Temp:  [99.6 F (37.6 C)] 99.6 F (37.6 C) (05/16 0115) Pulse Rate:  [107-126] 108 (05/16 0430) Resp:  [24-37] 32 (05/16 0430) BP: (111-144)/(65-80) 139/71 (05/16 0430) SpO2:  [87 %-95 %] 94 % (05/16 0430) Weight:  [180 lb (81.6 kg)] 180 lb (81.6 kg) (05/16 0211)  PHYSICAL EXAMINATION: General:  Caucasian male,on BiPAP Neuro: Drowsy, but arousable, oriented, answers questions appropriately HEENT:  AT,[place],No JVD Cardiovascular: S1S2, Regular, no m/r/g Lungs:  Coarse throughout, no wheezes,crackles,rhonchi Abdomen: Soft, NT,ND,+BS Musculoskeletal:  No edema, cyanosis noted Skin:  Warm ,dry and Intact   Recent Labs Lab 08/25/16 0129  NA 139  K 3.5  CL 100*  CO2 29  BUN 15  CREATININE 0.96  GLUCOSE 138*    Recent Labs Lab 08/25/16 0129  HGB 13.4  HCT  39.4*  WBC 10.4  PLT 241   Dg Chest Portable 1 View  Result Date: 08/25/2016 CLINICAL DATA:  Chest pain and shortness of breath EXAM: PORTABLE CHEST 1 VIEW COMPARISON:  Chest radiograph 06/23/2016 FINDINGS: There is improved aeration of the right middle lobe, though there is still some residual opacity. The lungs are hyperinflated with diffusely increased pulmonary markings. No pleural effusion or pneumothorax. No new area of consolidation. No pulmonary edema. Normal cardiomediastinal contours. IMPRESSION: 1. Improved aeration of the right middle lobe with mild residual opacity. 2. Hyperinflated lungs consistent with COPD. Electronically Signed   By: Ulyses Jarred M.D.   On: 08/25/2016 02:05    ASSESSMENT / PLAN:  Acute Hypoxic/Hypercarbic respiratory failure  HCAP Current smoker COPD Hx of Depression  PLAN Continue BiPAP, Wean as tolerated Follow cultures Monitor fever,CBC Continue Vanc/zosyn Continue benzonate/guaifenesin Continue Mirtazipine/escitalopram Nicotine Patch  Bincy Varughese,AG-ACNP Pulmonary and [place]   08/25/2016, 6:01 AM   Pt seen and examined with NP, agree with assessment and plan. He presented with worsening SoB, known history of COPD, current smoking. I personally reviewed the patient CXR films, this shows RML infiltrate, though some of this may be residual from a  previous severe pneumonia in the same location on previous films I also reviewed.  Will treat empirically for pneumonia. On ausculation there is scattered bilateral wheeze. Will continue abx, start empiric steroids, continue duonebs. Continue bipap and wean off/down as tolerated.   Pt was seen by Dr. Chancy Milroy for possible post obstructive pneumonia and need for bronchoscopy. Will consult to re-eval.    Marda Stalker, M.D.  08/25/2016

## 2016-08-26 ENCOUNTER — Inpatient Hospital Stay: Payer: Medicaid Other

## 2016-08-26 LAB — URINE CULTURE

## 2016-08-26 LAB — EXPECTORATED SPUTUM ASSESSMENT W REFEX TO RESP CULTURE

## 2016-08-26 LAB — BASIC METABOLIC PANEL
Anion gap: 8 (ref 5–15)
BUN: 20 mg/dL (ref 6–20)
CALCIUM: 9.3 mg/dL (ref 8.9–10.3)
CO2: 32 mmol/L (ref 22–32)
CREATININE: 0.75 mg/dL (ref 0.61–1.24)
Chloride: 101 mmol/L (ref 101–111)
GFR calc Af Amer: >60 mL/min (ref >60)
Glucose, Bld: 144 mg/dL — ABNORMAL HIGH (ref 65–99)
Potassium: 4.4 mmol/L (ref 3.5–5.1)
Sodium: 141 mmol/L (ref 135–145)

## 2016-08-26 LAB — CBC
HEMATOCRIT: 38.1 % — AB (ref 40.0–52.0)
Hemoglobin: 12.9 g/dL — ABNORMAL LOW (ref 13.0–18.0)
MCH: 28.5 pg (ref 26.0–34.0)
MCHC: 33.7 g/dL (ref 32.0–36.0)
MCV: 84.6 fL (ref 80.0–100.0)
Platelets: 283 10*3/uL (ref 150–440)
RBC: 4.51 MIL/uL (ref 4.40–5.90)
RDW: 14 % (ref 11.5–14.5)
WBC: 12.6 10*3/uL — ABNORMAL HIGH (ref 3.8–10.6)

## 2016-08-26 LAB — EXPECTORATED SPUTUM ASSESSMENT W GRAM STAIN, RFLX TO RESP C

## 2016-08-26 LAB — MAGNESIUM: MAGNESIUM: 2.1 mg/dL (ref 1.7–2.4)

## 2016-08-26 MED ORDER — IOPAMIDOL (ISOVUE-300) INJECTION 61%
75.0000 mL | Freq: Once | INTRAVENOUS | Status: AC | PRN
  Administered 2016-08-26: 75 mL via INTRAVENOUS

## 2016-08-26 MED ORDER — METHYLPREDNISOLONE SODIUM SUCC 40 MG IJ SOLR
40.0000 mg | Freq: Two times a day (BID) | INTRAMUSCULAR | Status: DC
  Administered 2016-08-27 – 2016-08-30 (×6): 40 mg via INTRAVENOUS
  Filled 2016-08-26 (×6): qty 1

## 2016-08-26 MED ORDER — INSULIN DETEMIR 100 UNIT/ML ~~LOC~~ SOLN
20.0000 [IU] | Freq: Every day | SUBCUTANEOUS | Status: DC
  Filled 2016-08-26: qty 0.2

## 2016-08-26 NOTE — Progress Notes (Signed)
[place] at [place] NAME: Cristopher Ciccarelli    MR#:  [id number]  DATE OF BIRTH:  07-10-1955  SUBJECTIVE:   Continues with sob. remains on 5 ;liter [place] oxygen REVIEW OF SYSTEMS:   Review of Systems  Constitutional: Positive for weight loss. Negative for chills and fever.  HENT: Negative for ear discharge, ear pain and nosebleeds.   Eyes: Negative for blurred vision, pain and discharge.  Respiratory: Positive for cough, sputum production and shortness of breath. Negative for wheezing and stridor.   Cardiovascular: Negative for chest pain, palpitations, orthopnea and PND.  Gastrointestinal: Negative for abdominal pain, diarrhea, nausea and vomiting.  Genitourinary: Negative for frequency and urgency.  Musculoskeletal: Negative for back pain and joint pain.  Neurological: Positive for weakness. Negative for sensory change, speech change and focal weakness.  Psychiatric/Behavioral: Negative for depression and hallucinations. The patient is not nervous/anxious.    Tolerating Diet:yes Tolerating PT: ambulatory  DRUG ALLERGIES:  No Known Allergies  VITALS:  Blood pressure (!) 150/69, pulse (!) 102, temperature 97.6 F (36.4 C), resp. rate 18, height '5\' 11"'$  (1.803 m), weight 65.7 kg (144 lb 13.5 oz), SpO2 93 %.  PHYSICAL EXAMINATION:   Physical Exam  GENERAL:  61 y.o.-year-old patient lying in the bed with no acute distress. Cachectic, thin EYES: Pupils equal, round, reactive to light and accommodation. No scleral icterus. Extraocular muscles intact.  HEENT: Head atraumatic, normocephalic. Oropharynx and nasopharynx clear.  NECK:  Supple, no jugular venous distention. No thyroid enlargement, no tenderness.  LUNGS: distant breath sounds bilaterally, no wheezing, rales, rhonchi.some use of accessory muscles of respiration.  CARDIOVASCULAR: S1, S2 normal. No murmurs, rubs, or gallops.  ABDOMEN: Soft, nontender, nondistended. Bowel sounds  present. No organomegaly or mass.  EXTREMITIES: No cyanosis, clubbing or edema b/l.    NEUROLOGIC: Cranial nerves II through XII are intact. No focal Motor or sensory deficits b/l.   PSYCHIATRIC:  patient is alert and oriented x 3.  SKIN: No obvious rash, lesion, or ulcer.   LABORATORY PANEL:  CBC  Recent Labs Lab 08/26/16 0539  WBC 12.6*  HGB 12.9*  HCT 38.1*  PLT 283    Chemistries   Recent Labs Lab 08/26/16 0539  NA 141  K 4.4  CL 101  CO2 32  GLUCOSE 144*  BUN 20  CREATININE 0.75  CALCIUM 9.3  MG 2.1   Cardiac Enzymes  Recent Labs Lab 08/25/16 0129  TROPONINI <0.03   RADIOLOGY:  Ct Chest W Contrast  Result Date: 08/26/2016 CLINICAL DATA:  Chronic hypoxic respiratory failure. Ongoing tobacco abuse. Possible lung mass. EXAM: CT CHEST WITH CONTRAST TECHNIQUE: Multidetector CT imaging of the chest was performed during intravenous contrast administration. CONTRAST:  7m ISOVUE-300 IOPAMIDOL (ISOVUE-300) INJECTION 61% COMPARISON:  06/23/2016. FINDINGS: Cardiovascular: The heart size appears normal. No pericardial effusion. Aortic atherosclerosis. Mediastinum/Nodes: The trachea appears patent and is midline. Normal appearance of the esophagus. Enlarged right paratracheal lymph node measures 2 cm, image 65 of series 2. Previously this measured the same. Sub- carinal lymph node is also enlarged measuring 1.2 cm. Right hilar node measures 1.4 cm. Lungs/Pleura: Moderate to advanced changes of centrilobular emphysema. Improving aeration to the right middle lobe is identified with residual subsegmental atelectasis. Multifocal areas of patchy ground-glass and nodular airspace consolidation identified bilaterally in a peribronchial distribution likely post infectious or inflammatory in etiology. New ground-glass attenuating lesion within the anteromedial left upper lobe measures 2.4 cm and is likely postinflammatory or infectious  in etiology. Upper Abdomen: No acute abnormality.  Musculoskeletal: No aggressive lytic or sclerotic bone lesions. IMPRESSION: 1. Persistent enlarged right paratracheal, right hilar and sub- carinal lymph nodes worrisome for malignancy. Favor primary bronchogenic carcinoma. Further evaluation with PET-CT and tissue sampling is advised. 2. Improved aeration to the right middle lobe. 3. Bilateral multifocal patchy peribronchial opacities are identified. Likely postinflammatory or infectious in etiology. Attention on follow-up imaging advise. 4. New ground-glass attenuating nodule in the anteromedial left upper lobe measuring 2.4 cm. Also likely postinflammatory. Attention to this nodule on follow-up exam is advised. 5. Aortic Atherosclerosis (ICD10-I70.0) and Emphysema (ICD10-J43.9). Electronically Signed   By: Kerby Moors M.D.   On: 08/26/2016 10:12   Dg Chest Portable 1 View  Result Date: 08/25/2016 CLINICAL DATA:  Chest pain and shortness of breath EXAM: PORTABLE CHEST 1 VIEW COMPARISON:  Chest radiograph 06/23/2016 FINDINGS: There is improved aeration of the right middle lobe, though there is still some residual opacity. The lungs are hyperinflated with diffusely increased pulmonary markings. No pleural effusion or pneumothorax. No new area of consolidation. No pulmonary edema. Normal cardiomediastinal contours. IMPRESSION: 1. Improved aeration of the right middle lobe with mild residual opacity. 2. Hyperinflated lungs consistent with COPD. Electronically Signed   By: Ulyses Jarred M.D.   On: 08/25/2016 02:05   ASSESSMENT AND PLAN:  Alyjah Lovingood  is a 61 y.o. male with a known history of CVA, COPD presented to the emergency room with increased shortness of breath . Patient was brought by the EMS from home. He used 10 nebulizer treatments at home without relief and his oxygen saturation was 68% on arrival  1. Acute on chronic hypoxic respiratory failure due to COPD exacerbation with ongoing Tobacco abuse -IV solumederol---to oral from  tomorrow -weaned off BIPAP. Currently on 5 liter . sats 92-93% -pt has right sided atelectasis with possible right lung mass -Awaiting dr Humphrey Rolls further decision to bronch or get PET scan as out pt _CT chest worrisome for bronchogenic carcinoma -empiric abxs  2. Ongoing toabcoo abuse -advised smoking cessation >4 mins spent  3. Disorientation due to hypoxemia-improving  4. Emphysema, chornic, severe -assess for oxygen at home  5.HTN Resume amlodipine   Case discussed with Care Management/Social Worker. Management plans discussed with the patient, family and they are in agreement.  CODE STATUS: FULL  DVT Prophylaxis: lovenox  TOTAL TIME TAKING CARE OF THIS PATIENT: *30* minutes.  >50% time spent on counselling and coordination of care  POSSIBLE D/C IN 1-2* DAYS, DEPENDING ON CLINICAL CONDITION.  Note: This dictation was prepared with Dragon dictation along with smaller phrase technology. Any transcriptional errors that result from this process are unintentional.  Shareeka Yim M.D on 08/26/2016 at 6:00 PM  Between 7am to 6pm - Pager - [phone]  After 6pm go to [email or web address] - password [email or web address] [place] [facility]  Office  [phone]  CC: Primary care physician; Marden Noble, MD

## 2016-08-26 NOTE — Care Management Note (Signed)
Case Management Note  Patient Details  Name: Ruben [name] MRN: [id number] Date of Birth: [birth year]  Subjective/Objective:   RNCM assessment for discharge needs. Spoke with patient and he is not on home O2. He uses a nebulizer at home. He is not home bound. Drives and is independent. Patient states he is not interested in any home health. 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 Will monitor for home O2 need.                 Action/Plan:   Expected Discharge Date:                  Expected Discharge Plan:     In-House Referral:     Discharge planning Services  CM Consult  Post Acute Care Choice:    Choice offered to:     DME Arranged:    DME Agency:     HH Arranged:    HH Agency:     Status of Service:  In process, will continue to follow  If discussed at Long Length of Stay Meetings, dates discussed:    Additional Comments:  Ruben Mango, RN 08/26/2016, 11:11 AM

## 2016-08-27 ENCOUNTER — Encounter: Payer: Self-pay | Admitting: *Deleted

## 2016-08-27 ENCOUNTER — Inpatient Hospital Stay: Payer: Medicaid Other

## 2016-08-27 ENCOUNTER — Encounter
Admission: EM | Disposition: A | Discharge status: Home / Self Care | Payer: Self-pay | Source: Home / Self Care | Attending: Internal Medicine

## 2016-08-27 HISTORY — PX: FLEXIBLE BRONCHOSCOPY: SHX5094

## 2016-08-27 LAB — HEMOGLOBIN A1C
Hgb A1c MFr Bld: 5 % (ref 4.8–5.6)
MEAN PLASMA GLUCOSE: 97 mg/dL

## 2016-08-27 SURGERY — BRONCHOSCOPY, FLEXIBLE
Anesthesia: Moderate Sedation | Laterality: Bilateral

## 2016-08-27 MED ORDER — IPRATROPIUM-ALBUTEROL 0.5-2.5 (3) MG/3ML IN SOLN
3.0000 mL | Freq: Once | RESPIRATORY_TRACT | Status: AC
  Administered 2016-08-27: 3 mL via RESPIRATORY_TRACT

## 2016-08-27 MED ORDER — BUTAMBEN-TETRACAINE-BENZOCAINE 2-2-14 % EX AERO
1.0000 | INHALATION_SPRAY | Freq: Once | CUTANEOUS | Status: DC
  Filled 2016-08-27: qty 20

## 2016-08-27 MED ORDER — FENTANYL CITRATE (PF) 100 MCG/2ML IJ SOLN
INTRAMUSCULAR | Status: AC
  Filled 2016-08-27: qty 4

## 2016-08-27 MED ORDER — SODIUM CHLORIDE 0.9 % IV SOLN: Freq: Once | INTRAVENOUS | Status: DC

## 2016-08-27 MED ORDER — LIDOCAINE HCL 2 % EX GEL
1.0000 "application " | Freq: Once | CUTANEOUS | Status: DC
  Filled 2016-08-27: qty 5

## 2016-08-27 MED ORDER — MIDAZOLAM HCL 5 MG/5ML IJ SOLN
INTRAMUSCULAR | Status: AC
  Filled 2016-08-27: qty 10

## 2016-08-27 MED ORDER — PHENYLEPHRINE HCL 0.25 % NA SOLN
1.0000 | Freq: Four times a day (QID) | NASAL | Status: DC | PRN
  Filled 2016-08-27: qty 15

## 2016-08-27 NOTE — Progress Notes (Signed)
Patient brought to the bronchoscopy suite. Noted to have SaO2 of 88% on oxygen. Patient was given neb treatments and fio2 increased to 5lpm. After this was done his SaO2 was 91%. Discussed with Dr patel and I feel that he is not safe to do a bronch at this time due to high oxygen requirements so will hold for today. Patient states he will not likely follow up as an outpatient and he already has shown this tendency from the last admission. We will keep him here over the weekend to try to optimize his pulmonary status and reassess on Monday for bronchoscopy.

## 2016-08-27 NOTE — Progress Notes (Signed)
Breathing treatment completed by RT, patient oxygen saturation improved to 92% on 4L n/c. Awaiting Dr Humphrey Rolls to see patient

## 2016-08-27 NOTE — Progress Notes (Signed)
[place] at [place] NAME: Ruben [name]    MR#:  [id number]  DATE OF BIRTH:  20-Jun-1955  SUBJECTIVE:   Continues with sob. remains on 5 ;liter [place] oxygen Patient's bronchoscopy was Canceled secondary to hypoxemia. He appears frustrated. He gets tearful during the conversation since he feels "scared" REVIEW OF SYSTEMS:   Review of Systems  Constitutional: Positive for weight loss. Negative for chills and fever.  HENT: Negative for ear discharge, ear pain and nosebleeds.   Eyes: Negative for blurred vision, pain and discharge.  Respiratory: Positive for cough, sputum production and shortness of breath. Negative for wheezing and stridor.   Cardiovascular: Negative for chest pain, palpitations, orthopnea and PND.  Gastrointestinal: Negative for abdominal pain, diarrhea, nausea and vomiting.  Genitourinary: Negative for frequency and urgency.  Musculoskeletal: Negative for back pain and joint pain.  Neurological: Positive for weakness. Negative for sensory change, speech change and focal weakness.  Psychiatric/Behavioral: Negative for depression and hallucinations. The patient is not nervous/anxious.    Tolerating Diet:yes Tolerating PT: ambulatory  DRUG ALLERGIES:  No Known Allergies  VITALS:  Blood pressure 135/84, pulse 84, temperature 97.7 F (36.5 C), temperature source Oral, resp. rate 18, height '5\' 11"'$  (1.803 m), weight 65.7 kg (144 lb 13.5 oz), SpO2 92 %.  PHYSICAL EXAMINATION:   Physical Exam  GENERAL:  61 y.o.-year-old patient lying in the bed with no acute distress. Cachectic, thin EYES: Pupils equal, round, reactive to light and accommodation. No scleral icterus. Extraocular muscles intact.  HEENT: Head atraumatic, normocephalic. Oropharynx and nasopharynx clear.  NECK:  Supple, no jugular venous distention. No thyroid enlargement, no tenderness.  LUNGS: distant breath sounds bilaterally, no wheezing, rales,  rhonchi.some use of accessory muscles of respiration.  CARDIOVASCULAR: S1, S2 normal. No murmurs, rubs, or gallops.  ABDOMEN: Soft, nontender, nondistended. Bowel sounds present. No organomegaly or mass.  EXTREMITIES: No cyanosis, clubbing or edema b/l.    NEUROLOGIC: Cranial nerves II through XII are intact. No focal Motor or sensory deficits b/l.   PSYCHIATRIC:  patient is alert and oriented x 3.  SKIN: No obvious rash, lesion, or ulcer.   LABORATORY PANEL:  CBC  Recent Labs Lab 08/26/16 0539  WBC 12.6*  HGB 12.9*  HCT 38.1*  PLT 283    Chemistries   Recent Labs Lab 08/26/16 0539  NA 141  K 4.4  CL 101  CO2 32  GLUCOSE 144*  BUN 20  CREATININE 0.75  CALCIUM 9.3  MG 2.1   Cardiac Enzymes  Recent Labs Lab 08/25/16 0129  TROPONINI <0.03   RADIOLOGY:  Ct Chest W Contrast  Result Date: 08/26/2016 CLINICAL DATA:  Chronic hypoxic respiratory failure. Ongoing tobacco abuse. Possible lung mass. EXAM: CT CHEST WITH CONTRAST TECHNIQUE: Multidetector CT imaging of the chest was performed during intravenous contrast administration. CONTRAST:  15m ISOVUE-300 IOPAMIDOL (ISOVUE-300) INJECTION 61% COMPARISON:  06/23/2016. FINDINGS: Cardiovascular: The heart size appears normal. No pericardial effusion. Aortic atherosclerosis. Mediastinum/Nodes: The trachea appears patent and is midline. Normal appearance of the esophagus. Enlarged right paratracheal lymph node measures 2 cm, image 65 of series 2. Previously this measured the same. Sub- carinal lymph node is also enlarged measuring 1.2 cm. Right hilar node measures 1.4 cm. Lungs/Pleura: Moderate to advanced changes of centrilobular emphysema. Improving aeration to the right middle lobe is identified with residual subsegmental atelectasis. Multifocal areas of patchy ground-glass and nodular airspace consolidation identified bilaterally in a peribronchial distribution likely post infectious or inflammatory  in etiology. New ground-glass  attenuating lesion within the anteromedial left upper lobe measures 2.4 cm and is likely postinflammatory or infectious in etiology. Upper Abdomen: No acute abnormality. Musculoskeletal: No aggressive lytic or sclerotic bone lesions. IMPRESSION: 1. Persistent enlarged right paratracheal, right hilar and sub- carinal lymph nodes worrisome for malignancy. Favor primary bronchogenic carcinoma. Further evaluation with PET-CT and tissue sampling is advised. 2. Improved aeration to the right middle lobe. 3. Bilateral multifocal patchy peribronchial opacities are identified. Likely postinflammatory or infectious in etiology. Attention on follow-up imaging advise. 4. New ground-glass attenuating nodule in the anteromedial left upper lobe measuring 2.4 cm. Also likely postinflammatory. Attention to this nodule on follow-up exam is advised. 5. Aortic Atherosclerosis (ICD10-I70.0) and Emphysema (ICD10-J43.9). Electronically Signed   By: Kerby Moors M.D.   On: 08/26/2016 10:12   ASSESSMENT AND PLAN:  Kaniel Kiang  is a 61 y.o. male with a known history of CVA, COPD presented to the emergency room with increased shortness of breath . Patient was brought by the EMS from home. He used 10 nebulizer treatments at home without relief and his oxygen saturation was 68% on arrival  1. Acute on chronic hypoxic respiratory failure due to COPD exacerbation with ongoing Tobacco abuse -IV solumederol -weaned off BIPAP. Currently on 5 liter [place]. sats 92-93% -pt has right sided atelectasis with possible right lung mass -Attempted for bronchoscopy however canceled secondary to hypoxemia. We'll attempt again on Monday if possible. _CT chest worrisome for bronchogenic carcinoma -empiric abxs  2. Ongoing toabcoo abuse -advised smoking cessation >4 mins spent  3. Disorientation due to hypoxemia-improving  4. Emphysema, chornic, severe -assess for oxygen at home  5.HTN Resume amlodipine   Case discussed with Care  Management/Social Worker. Management plans discussed with the patient, family and they are in agreement.  CODE STATUS: FULL  DVT Prophylaxis: lovenox  TOTAL TIME TAKING CARE OF THIS PATIENT: *30* minutes.  >50% time spent on counselling and coordination of care  POSSIBLE D/C IN 1-2* DAYS, DEPENDING ON CLINICAL CONDITION.  Note: This dictation was prepared with Dragon dictation along with smaller phrase technology. Any transcriptional errors that result from this process are unintentional.  Bishoy Cupp M.D on 08/27/2016 at 1:15 PM  Between 7am to 6pm - Pager - [phone]  After 6pm go to [email or web address] - password [email or web address] [place] [facility]  Office  [phone]  CC: Primary care physician; Marden Noble, MD

## 2016-08-27 NOTE — Progress Notes (Signed)
Patient stated that he does not know why he is in [facility] or what procedure he is having done today despite having already signed consent previously. Oxygen saturation 88% on 4L [place] upon arrival to [facility]. Lungs with expiratory wheezes throughout. RT paged for PRN breathing treatment. Paged MD and informed of patient respiratory status, oxygen saturation, and questions regarding procedure and follow up. MD stated that he would come to the bedside to speak with patient and assess respiratory status before procedure.

## 2016-08-28 NOTE — Evaluation (Signed)
Physical Therapy Evaluation Patient Details Name: Ruben [name] MRN: [id number] DOB: [birth year] Today's Date: 08/28/2016   History of Present Illness  61 yo male with onset of severe SOB and acute respiratory failure was admitted with very declined O2 sats, now on O2 and bipap.  Noted possibly CA lymph nodes, enphysema, aortic atherosclerosis.  PMHx:  CVA, COPD  Clinical Impression  Pt was seen for mobility assessment and to determine his tolerance for gait with regards to O2 sats.  He had just finished walking with nursing without O2 and was very exhausted by the efforts.  He worked on shorter trips for PT to manage his fatigue with O2 on, and could maintain his sats but pulses were 110-115 with short trips.  Will follow acutely to measure his vitals and continue to use O2 for now given his system's tolerance without it.    Follow Up Recommendations Home health PT;Supervision for mobility/OOB    Equipment Recommendations  Cane    Recommendations for Other Services       Precautions / Restrictions Precautions Precautions: Fall Precaution Comments: ck O2 sats with all mobility, on 4L O2 today      Mobility  Bed Mobility               General bed mobility comments: up when PT arrived  Transfers Overall transfer level: Modified independent Equipment used: None             General transfer comment: uses hands consistently to stand  Ambulation/Gait Ambulation/Gait assistance: Supervision;Min guard Ambulation Distance (Feet): 40 Feet (x 2) Assistive device: None Gait Pattern/deviations: Step-to pattern;Step-through pattern;Wide base of support;Drifts right/left;Shuffle;Decreased stride length (reaching for close furniture) Gait velocity: reduced Gait velocity interpretation: Below normal speed for age/gender    Stairs            Wheelchair Mobility    Modified Rankin (Stroke Patients Only)       Balance Overall balance assessment: Needs  assistance Sitting-balance support: Feet supported Sitting balance-Leahy Scale: Good     Standing balance support: Single extremity supported Standing balance-Leahy Scale: Fair                               Pertinent Vitals/Pain Pain Assessment: No/denies pain    Home Living Family/patient expects to be discharged to:: Private residence Living Arrangements: Spouse/significant other Available Help at Discharge: Family;Available 24 hours/day Type of Home: House Home Access: Ramped entrance     Home Layout: One level Home Equipment: None      Prior Function Level of Independence: Independent               Hand Dominance        Extremity/Trunk Assessment   Upper Extremity Assessment Upper Extremity Assessment: Overall WFL for tasks assessed    Lower Extremity Assessment Lower Extremity Assessment: Generalized weakness    Cervical / Trunk Assessment Cervical / Trunk Assessment: Normal  Communication   Communication: No difficulties  Cognition Arousal/Alertness: Awake/alert Behavior During Therapy: WFL for tasks assessed/performed Overall Cognitive Status: Within Functional Limits for tasks assessed                                        General Comments General comments (skin integrity, edema, etc.): Pt had O2 sats 97% initially on 4L O2 and was at 93% after walks,  pulse up from 95 to 110 and 115 respectively with walks     Exercises     Assessment/Plan    PT Assessment Patient needs continued PT services  PT Problem List Decreased strength;Decreased range of motion;Decreased activity tolerance;Decreased balance;Decreased mobility;Decreased coordination;Decreased knowledge of use of DME;Decreased safety awareness;Cardiopulmonary status limiting activity       PT Treatment Interventions Gait training;DME instruction;Functional mobility training;Therapeutic activities;Therapeutic exercise;Balance training;Neuromuscular  re-education;Patient/family education    PT Goals (Current goals can be found in the Care Plan section)  Acute Rehab PT Goals Patient Stated Goal: to try to stop smoking PT Goal Formulation: With patient Time For Goal Achievement: 09/11/16 Potential to Achieve Goals: Good    Frequency Min 2X/week   Barriers to discharge   has help at home and safe layout    Co-evaluation               AM-PAC PT "6 Clicks" Daily Activity  Outcome Measure Difficulty turning over in bed (including adjusting bedclothes, sheets and blankets)?: None Difficulty moving from lying on back to sitting on the side of the bed? : None Difficulty sitting down on and standing up from a chair with arms (e.g., wheelchair, bedside commode, etc,.)?: None Help needed moving to and from a bed to chair (including a wheelchair)?: None Help needed walking in hospital room?: A Little Help needed climbing 3-5 steps with a railing? : A Little 6 Click Score: 22    End of Session Equipment Utilized During Treatment: Oxygen;Gait belt Activity Tolerance: Treatment limited secondary to medical complications (Comment) (pulses and O2 sats, and had just taken a walk with no O2 ) Patient left: in chair;with call bell/phone within reach;with chair alarm set Nurse Communication: Mobility status PT Visit Diagnosis: Adult, failure to thrive (R62.7);Muscle weakness (generalized) (M62.81);Unsteadiness on feet (R26.81)    Time: 0277-4128 PT Time Calculation (min) (ACUTE ONLY): 16 min   Charges:   PT Evaluation $PT Eval Moderate Complexity: 1 Procedure     PT G Codes:   PT G-Codes **NOT FOR INPATIENT CLASS** Functional Assessment Tool Used: AM-PAC 6 Clicks Basic Mobility    Ramond Dial 08/28/2016, 12:25 PM   Mee Hives, PT MS Acute Rehab Dept. Number: [place] [facility] [place]

## 2016-08-28 NOTE — Care Management Note (Signed)
Case Management Note  Patient Details  Name: URIAH PHILIPSON MRN: [id number] Date of Birth: 08/15/1955  Subjective/Objective:      Gara Kroner RN, documented  02 Sat measurements today and Mr Palinkas qualified for home oxygen on 5L N/C. There is a discharge order written yesterday. Discussed discharge planning with Dr Fritzi Mandes and she reports that Mr Cerda will not be discharged until Monday. Will re-evaluate for new home oxygen on Monday if Mr Voorhies is discharged then.             Action/Plan:   Expected Discharge Date:  08/26/16               Expected Discharge Plan:     In-House Referral:     Discharge planning Services  CM Consult  Post Acute Care Choice:    Choice offered to:     DME Arranged:    DME Agency:     HH Arranged:    HH Agency:     Status of Service:  In process, will continue to follow  If discussed at Long Length of Stay Meetings, dates discussed:    Additional Comments:  Trajan Grove A, RN 08/28/2016, 11:23 AM

## 2016-08-28 NOTE — Progress Notes (Signed)
[facility] at [place] NAME: Ruben [name]    MR#:  [id number]  DATE OF BIRTH:  1955/10/14  SUBJECTIVE:   Continues with sob. remains on 5 ;liter [place] oxygen  REVIEW OF SYSTEMS:   Review of Systems  Constitutional: Positive for weight loss. Negative for chills and fever.  HENT: Negative for ear discharge, ear pain and nosebleeds.   Eyes: Negative for blurred vision, pain and discharge.  Respiratory: Positive for cough, sputum production and shortness of breath. Negative for wheezing and stridor.   Cardiovascular: Negative for chest pain, palpitations, orthopnea and PND.  Gastrointestinal: Negative for abdominal pain, diarrhea, nausea and vomiting.  Genitourinary: Negative for frequency and urgency.  Musculoskeletal: Negative for back pain and joint pain.  Neurological: Positive for weakness. Negative for sensory change, speech change and focal weakness.  Psychiatric/Behavioral: Negative for depression and hallucinations. The patient is not nervous/anxious.    Tolerating Diet:yes Tolerating PT: ambulatory  DRUG ALLERGIES:  No Known Allergies  VITALS:  Blood pressure 126/69, pulse (!) 113, temperature 98.1 F (36.7 C), temperature source Oral, resp. rate 20, height '5\' 11"'$  (1.803 m), weight 65.7 kg (144 lb 13.5 oz), SpO2 96 %.  PHYSICAL EXAMINATION:   Physical Exam  GENERAL:  61 y.o.-year-old patient lying in the bed with no acute distress. Cachectic, thin EYES: Pupils equal, round, reactive to light and accommodation. No scleral icterus. Extraocular muscles intact.  HEENT: Head atraumatic, normocephalic. Oropharynx and nasopharynx clear.  NECK:  Supple, no jugular venous distention. No thyroid enlargement, no tenderness.  LUNGS: distant breath sounds bilaterally, no wheezing, rales, rhonchi.some use of accessory muscles of respiration.  CARDIOVASCULAR: S1, S2 normal. No murmurs, rubs, or gallops.  ABDOMEN: Soft, nontender,  nondistended. Bowel sounds present. No organomegaly or mass.  EXTREMITIES: No cyanosis, clubbing or edema b/l.    NEUROLOGIC: Cranial nerves II through XII are intact. No focal Motor or sensory deficits b/l.   PSYCHIATRIC:  patient is alert and oriented x 3.  SKIN: No obvious rash, lesion, or ulcer.   LABORATORY PANEL:  CBC  Recent Labs Lab 08/26/16 0539  WBC 12.6*  HGB 12.9*  HCT 38.1*  PLT 283    Chemistries   Recent Labs Lab 08/26/16 0539  NA 141  K 4.4  CL 101  CO2 32  GLUCOSE 144*  BUN 20  CREATININE 0.75  CALCIUM 9.3  MG 2.1   Cardiac Enzymes  Recent Labs Lab 08/25/16 0129  TROPONINI <0.03   RADIOLOGY:  No results found. ASSESSMENT AND PLAN:  Ruben [name]  is a 61 y.o. male with a known history of CVA, COPD presented to the emergency room with increased shortness of breath . Patient was brought by the EMS from home. He used 10 nebulizer treatments at home without relief and his oxygen saturation was 68% on arrival  1. Acute on chronic hypoxic respiratory failure due to COPD exacerbation with ongoing Tobacco abuse -IV solumederol -weaned off BIPAP. Currently on 5 liter [place]. sats 92-93% -pt has right sided atelectasis with possible right lung mass -Attempted for bronchoscopy however canceled secondary to hypoxemia. We'll attempt again on Monday if possible. _CT chest worrisome for bronchogenic carcinoma -empiric abxs  2. Ongoing toabcoo abuse -advised smoking cessation >4 mins spent  3. Disorientation due to hypoxemia-improving  4. Emphysema, chornic, severe -Patient will need oxygen for home. Care management consulted  5.HTN Resume amlodipine   Case discussed with Care Management/Social Worker. Management plans discussed with the patient,  family and they are in agreement.  CODE STATUS: FULL  DVT Prophylaxis: lovenox  TOTAL TIME TAKING CARE OF THIS PATIENT: *30* minutes.  >50% time spent on counselling and coordination of care  POSSIBLE  D/C IN 1-2* DAYS, DEPENDING ON CLINICAL CONDITION.  Note: This dictation was prepared with Dragon dictation along with smaller phrase technology. Any transcriptional errors that result from this process are unintentional.  Shoji Pertuit M.D on 08/28/2016 at 11:53 AM  Between 7am to 6pm - Pager - [phone]  After 6pm go to [email or web address] - password [email or web address] [facility]  Office  [phone]  CC: Primary care physician; Marden Noble, MD

## 2016-08-28 NOTE — Progress Notes (Signed)
Doing well, resting in bed. continues on 02 at 5 liters.

## 2016-08-28 NOTE — Progress Notes (Signed)
bipap declined

## 2016-08-28 NOTE — Progress Notes (Signed)
Bipap declined 

## 2016-08-28 NOTE — Progress Notes (Signed)
Resting 95% on 5l  Without o2  90%  At rest   Activity with oxygen on 5L 94% and   Activity without oxygen  85%

## 2016-08-28 NOTE — Progress Notes (Signed)
PT Cancellation Note  Patient Details Name: Ruben [name] MRN: [id number] DOB: July 26, 1955   Cancelled Treatment:    Reason Eval/Treat Not Completed: Other (comment) (Pt was on the phone and did not stop when PT arrived, ck lat)er to see if he is willing to participate in tx.   Ramond Dial 08/28/2016, 10:27 AM   Mee Hives, PT MS Acute Rehab Dept. Number: [place] [facility] [place]

## 2016-08-29 DIAGNOSIS — I481 Persistent atrial fibrillation: Secondary | ICD-10-CM

## 2016-08-29 LAB — CULTURE, RESPIRATORY

## 2016-08-29 LAB — GLUCOSE, CAPILLARY: GLUCOSE-CAPILLARY: 113 mg/dL — AB (ref 65–99)

## 2016-08-29 LAB — CULTURE, RESPIRATORY W GRAM STAIN

## 2016-08-29 LAB — MRSA PCR SCREENING: MRSA BY PCR: NEGATIVE

## 2016-08-29 MED ORDER — DILTIAZEM HCL 100 MG IV SOLR
5.0000 mg/h | INTRAVENOUS | Status: DC
  Administered 2016-08-29 (×2): 15 mg/h via INTRAVENOUS
  Administered 2016-08-30: 20 mg/h via INTRAVENOUS
  Filled 2016-08-29 (×5): qty 100

## 2016-08-29 MED ORDER — AMIODARONE IV BOLUS ONLY 150 MG/100ML
150.0000 mg | Freq: Once | INTRAVENOUS | Status: AC
  Administered 2016-08-29: 150 mg via INTRAVENOUS
  Filled 2016-08-29: qty 100

## 2016-08-29 MED ORDER — SODIUM CHLORIDE 0.9 % IV BOLUS (SEPSIS)
500.0000 mL | Freq: Once | INTRAVENOUS | Status: AC
  Administered 2016-08-29: 500 mL via INTRAVENOUS

## 2016-08-29 MED ORDER — DILTIAZEM HCL 25 MG/5ML IV SOLN: 20.0000 mg | Freq: Once | INTRAVENOUS | Status: DC

## 2016-08-29 MED ORDER — METOPROLOL TARTRATE 5 MG/5ML IV SOLN
5.0000 mg | Freq: Once | INTRAVENOUS | Status: AC
  Administered 2016-08-29: 5 mg via INTRAVENOUS
  Filled 2016-08-29: qty 5

## 2016-08-29 MED ORDER — DILTIAZEM HCL ER COATED BEADS 240 MG PO CP24
240.0000 mg | ORAL_CAPSULE | Freq: Every day | ORAL | Status: DC
  Administered 2016-08-29: 240 mg via ORAL
  Filled 2016-08-29: qty 1

## 2016-08-29 NOTE — Progress Notes (Signed)
MD Patel at the bedside pts BP 84/60 HR 152. Orders received to transfer pt to ICU.

## 2016-08-29 NOTE — Plan of Care (Signed)
Problem: Respiratory: Goal: Respiratory status will improve Outcome: Not Progressing Pt remains on 5 L nasal cannula

## 2016-08-29 NOTE — Progress Notes (Signed)
[facility] at [place] NAME: Ruben [name]    MR#:  [id number]  DATE OF BIRTH:  September 18, 1955  SUBJECTIVE:   Continues with sob. remains on 5 [place] oxygen HR in the 170's and BP 80's  REVIEW OF SYSTEMS:   Review of Systems  Constitutional: Positive for weight loss. Negative for chills and fever.  HENT: Negative for ear discharge, ear pain and nosebleeds.   Eyes: Negative for blurred vision, pain and discharge.  Respiratory: Positive for cough, sputum production and shortness of breath. Negative for wheezing and stridor.   Cardiovascular: Negative for chest pain, palpitations, orthopnea and PND.  Gastrointestinal: Negative for abdominal pain, diarrhea, nausea and vomiting.  Genitourinary: Negative for frequency and urgency.  Musculoskeletal: Negative for back pain and joint pain.  Neurological: Positive for weakness. Negative for sensory change, speech change and focal weakness.  Psychiatric/Behavioral: Negative for depression and hallucinations. The patient is not nervous/anxious.    Tolerating Diet:yes Tolerating PT: ambulatory  DRUG ALLERGIES:  No Known Allergies  VITALS:  Blood pressure (!) 84/60, pulse (!) 152, temperature 98.4 F (36.9 C), temperature source Oral, resp. rate 18, height '5\' 11"'$  (1.803 m), weight 65.7 kg (144 lb 13.5 oz), SpO2 97 %.  PHYSICAL EXAMINATION:   Physical Exam  GENERAL:  61 y.o.-year-old patient lying in the bed with no acute distress. Cachectic, thin EYES: Pupils equal, round, reactive to light and accommodation. No scleral icterus. Extraocular muscles intact.  HEENT: Head atraumatic, normocephalic. Oropharynx and nasopharynx clear.  NECK:  Supple, no jugular venous distention. No thyroid enlargement, no tenderness.  LUNGS: distant breath sounds bilaterally, no wheezing, rales, rhonchi.some use of accessory muscles of respiration.  CARDIOVASCULAR: S1, S2 normal. No murmurs, rubs, or gallops.  Severe tachycardia ABDOMEN: Soft, nontender, nondistended. Bowel sounds present. No organomegaly or mass.  EXTREMITIES: No cyanosis, clubbing or edema b/l.    NEUROLOGIC: Cranial nerves II through XII are intact. No focal Motor or sensory deficits b/l.   PSYCHIATRIC:  patient is alert and oriented x 3.  SKIN: No obvious rash, lesion, or ulcer.   LABORATORY PANEL:  CBC  Recent Labs Lab 08/26/16 0539  WBC 12.6*  HGB 12.9*  HCT 38.1*  PLT 283    Chemistries   Recent Labs Lab 08/26/16 0539  NA 141  K 4.4  CL 101  CO2 32  GLUCOSE 144*  BUN 20  CREATININE 0.75  CALCIUM 9.3  MG 2.1   Cardiac Enzymes  Recent Labs Lab 08/25/16 0129  TROPONINI <0.03   RADIOLOGY:  No results found. ASSESSMENT AND PLAN:  Ruben [name]  is a 60 y.o. male with a known history of CVA, COPD presented to the emergency room with increased shortness of breath . Patient was brought by the EMS from home. He used 10 nebulizer treatments at home without relief and his oxygen saturation was 68% on arrival  1. Acute on chronic hypoxic respiratory failure due to severe end stage COPD exacerbation with ongoing Tobacco abuse -IV solumederol -weaned off BIPAP. Currently on 5 liter [place]. sats 92-93% -pt has right sided atelectasis with possible right lung mass -Attempted for bronchoscopy by Dr Humphrey Rolls however canceled secondary to hypoxemia. We'll attempt again on Monday if possible. _CT chest worrisome for bronchogenic carcinoma -empiric abxs   2. Ongoing toabcoo abuse -advised smoking cessation >4 mins spent  3.Afib with RVR and hypotension HR in the 170's -bp in the 80's IV ns bolus -cardizem gtt Spoke with dr  kasa   4. Emphysema, chornic, severe -Patient will need oxygen for home.  Pt has poor prognosis. Spoke with ex-wife Lorelle Gibbs [phone] and informed of his poor lung conition and overall prognosis  Case discussed with Care Management/Social Worker. Management plans discussed with  the patient, family and they are in agreement.  CODE STATUS: FULL  DVT Prophylaxis: lovenox  TOTAL TIME TAKING CARE OF THIS PATIENT: *30* minutes.  >50% time spent on counselling and coordination of care   Note: This dictation was prepared with Dragon dictation along with smaller phrase technology. Any transcriptional errors that result from this process are unintentional.  Jace Fermin M.D on 08/29/2016 at 11:58 AM  Between 7am to 6pm - Pager - [phone]  After 6pm go to [email or web address] - password [email or web address] [facility]  Office  [phone]  CC: Primary care physician; Marden Noble, MD

## 2016-08-29 NOTE — Progress Notes (Signed)
Pts HR in the 150's.  MD Posey Pronto notified. MD coming to bedside

## 2016-08-29 NOTE — Consult Note (Signed)
   Name: Ruben [name] MRN: [id number] DOB: 02-03-1956    ADMISSION DATE:  08/25/2016  CHIEF COMPLAINT:  Shortness of breath  CONSULTATION  DATE: 08/25/16  REFERRING MD: Dr. Estanislado Pandy   BRIEF PATIENT DESCRIPTION: 61 year old with Acute Respiratory Failure secondary to  right middle lobe PNA, transferred to gen med floor followed by Dr Humphrey Rolls, did NOT perform bronch due to resp distress, transferred back to ICU for afib with RVR 5/20.  SIGNIFICANT EVENTS  5/16 Patient admitted to the ICU with respiratory failure secondary to PNA requiring BiPAP 5/20 transferred back to ICU for afib with RVR  STUDIES:  None   HISTORY OF PRESENT ILLNESS:   Patient transferred back to ICU for afib with RVR Patient alert and awake, no sign resp distress afib with hr 170   REVIEW OF SYSTEMS:   Constitutional: Negative for fever, chills, weight loss, malaise/fatigue and diaphoresis.  HENT: Negative for hearing loss, ear pain, nosebleeds, congestion, sore throat, neck pain, tinnitus and ear discharge.   Eyes: Negative for blurred vision, double vision, photophobia, pain, discharge and redness.  Respiratory: -Negative for cough,- hemoptysis, -sputum production, +shortness of breath, -wheezing and- stridor.   Cardiovascular: Negative for chest pain, palpitations, orthopnea, claudication, leg swelling .ALL OTHER ROS Negative    VITAL SIGNS: Temp:  [98.2 F (36.8 C)-98.4 F (36.9 C)] 98.2 F (36.8 C) (05/20 1223) Pulse Rate:  [68-152] 152 (05/20 1138) Resp:  [14-18] 14 (05/20 1223) BP: (84-117)/(48-88) 117/88 (05/20 1223) SpO2:  [92 %-97 %] 92 % (05/20 1223)  PHYSICAL EXAMINATION: General:  Caucasian male,no sign resp distress Neuro:oriented, answers questions appropriately HEENT:  AT,[place],No JVD Cardiovascular: S1S2, Regular, no m/r/g Lungs:  Coarse throughout, no wheezes,crackles,rhonchi Abdomen: Soft, NT,ND,+BS Musculoskeletal:  No edema, cyanosis noted Skin:  Warm ,dry and Intact   Recent  Labs Lab 08/25/16 0129 08/25/16 0715 08/26/16 0539  NA 139  --  141  K 3.5  --  4.4  CL 100*  --  101  CO2 29  --  32  BUN 15  --  20  CREATININE 0.96 0.89 0.75  GLUCOSE 138*  --  144*    Recent Labs Lab 08/25/16 0129 08/25/16 0715 08/26/16 0539  HGB 13.4 13.1 12.9*  HCT 39.4* 38.8* 38.1*  WBC 10.4 8.3 12.6*  PLT 241 225 283   No results found.  ASSESSMENT / PLAN:  Acute Hypoxic/Hypercarbic respiratory failure from COPD exacerbation complicated by Afib with RVR   PLAN Oxygen as toolerated IV steroids Rate control with cardizem infusion-amiodorone bolus  Recommend cardiology consult Check ECHO Follow cardiac enzymes   Continue Step Down Status  [id number] Patricia Pesa, M.D.  [facility]  Medical Director [place] Director [place] Department

## 2016-08-29 NOTE — Progress Notes (Signed)
Report called to [facility] in ICU

## 2016-08-30 ENCOUNTER — Inpatient Hospital Stay: Admit: 2016-08-30 | Payer: Medicaid Other

## 2016-08-30 ENCOUNTER — Inpatient Hospital Stay: Payer: Medicaid Other

## 2016-08-30 ENCOUNTER — Telehealth: Payer: Self-pay | Admitting: *Deleted

## 2016-08-30 DIAGNOSIS — R590 Localized enlarged lymph nodes: Secondary | ICD-10-CM

## 2016-08-30 DIAGNOSIS — R911 Solitary pulmonary nodule: Secondary | ICD-10-CM

## 2016-08-30 DIAGNOSIS — I4891 Unspecified atrial fibrillation: Secondary | ICD-10-CM

## 2016-08-30 LAB — BASIC METABOLIC PANEL
ANION GAP: 4 — AB (ref 5–15)
BUN: 23 mg/dL — ABNORMAL HIGH (ref 6–20)
CHLORIDE: 99 mmol/L — AB (ref 101–111)
CO2: 34 mmol/L — AB (ref 22–32)
CREATININE: 0.8 mg/dL (ref 0.61–1.24)
Calcium: 8.9 mg/dL (ref 8.9–10.3)
GFR calc non Af Amer: >60 mL/min (ref >60)
Glucose, Bld: 151 mg/dL — ABNORMAL HIGH (ref 65–99)
Potassium: 4.8 mmol/L (ref 3.5–5.1)
SODIUM: 137 mmol/L (ref 135–145)

## 2016-08-30 LAB — CULTURE, BLOOD (ROUTINE X 2)
Culture: NO GROWTH
Culture: NO GROWTH
Special Requests: ADEQUATE
Special Requests: ADEQUATE

## 2016-08-30 LAB — CBC
HEMATOCRIT: 40.6 % (ref 40.0–52.0)
HEMOGLOBIN: 13.9 g/dL (ref 13.0–18.0)
MCH: 28.3 pg (ref 26.0–34.0)
MCHC: 34.1 g/dL (ref 32.0–36.0)
MCV: 83 fL (ref 80.0–100.0)
Platelets: 392 10*3/uL (ref 150–440)
RBC: 4.9 MIL/uL (ref 4.40–5.90)
RDW: 13.8 % (ref 11.5–14.5)
WBC: 13.5 10*3/uL — AB (ref 3.8–10.6)

## 2016-08-30 LAB — PHOSPHORUS: PHOSPHORUS: 4.3 mg/dL (ref 2.5–4.6)

## 2016-08-30 LAB — MAGNESIUM: MAGNESIUM: 2 mg/dL (ref 1.7–2.4)

## 2016-08-30 MED ORDER — ENOXAPARIN SODIUM 80 MG/0.8ML ~~LOC~~ SOLN
1.0000 mg/kg | Freq: Two times a day (BID) | SUBCUTANEOUS | Status: DC
  Administered 2016-08-30 (×2): 65 mg via SUBCUTANEOUS
  Filled 2016-08-30 (×2): qty 0.8

## 2016-08-30 MED ORDER — PREDNISONE 20 MG PO TABS
40.0000 mg | ORAL_TABLET | Freq: Every day | ORAL | Status: DC
  Administered 2016-08-30 – 2016-09-01 (×3): 40 mg via ORAL
  Filled 2016-08-30 (×3): qty 2

## 2016-08-30 MED ORDER — BENZONATATE 100 MG PO CAPS: 100.0000 mg | ORAL_CAPSULE | Freq: Three times a day (TID) | ORAL | Status: DC | PRN

## 2016-08-30 MED ORDER — METOPROLOL TARTRATE 5 MG/5ML IV SOLN
2.5000 mg | INTRAVENOUS | Status: DC | PRN
  Administered 2016-08-30 – 2016-09-01 (×4): 5 mg via INTRAVENOUS
  Filled 2016-08-30 (×4): qty 5

## 2016-08-30 MED ORDER — DILTIAZEM HCL ER COATED BEADS 240 MG PO CP24
240.0000 mg | ORAL_CAPSULE | Freq: Every day | ORAL | Status: DC
  Administered 2016-08-30 – 2016-09-01 (×3): 240 mg via ORAL
  Filled 2016-08-30 (×3): qty 1

## 2016-08-30 MED ORDER — NICOTINE 21 MG/24HR TD PT24
21.0000 mg | MEDICATED_PATCH | Freq: Every day | TRANSDERMAL | Status: DC
  Administered 2016-08-30 – 2016-09-01 (×3): 21 mg via TRANSDERMAL
  Filled 2016-08-30 (×3): qty 1

## 2016-08-30 NOTE — Progress Notes (Signed)
ANTICOAGULATION CONSULT NOTE - Initial Consult  Pharmacy Consult for Lovenox Indication: atrial fibrillation  No Known Allergies  Patient Measurements: Height: '5\' 11"'$  (180.3 cm) Weight: 144 lb 13.5 oz (65.7 kg) IBW/kg (Calculated) : 75.3  Vital Signs: Temp: 98.1 F (36.7 C) (05/21 1200) Temp Source: Oral (05/21 1200) BP: 131/75 (05/21 1200) Pulse Rate: 90 (05/21 1200)  Labs:  Recent Labs  08/30/16 0734  HGB 13.9  HCT 40.6  PLT 392  CREATININE 0.80    Estimated Creatinine Clearance: 91.3 mL/min (by C-G formula based on SCr of 0.8 mg/dL).   Medical History: Past Medical History:  Diagnosis Date  . COPD (chronic obstructive pulmonary disease) ([place])   . Coronary artery disease   . Depression   . Hypertension   . Myocardial infarction ([place])   . Stroke [facility])     Assessment: 61 y/o M admitted with PNA and AECOPD returned to the ICU due to atrial fibrillation with RVR.   Goal of Therapy:  Monitor platelets by anticoagulation protocol: Yes   Plan:  Lovenox 1 mg/kg q 12 hours.   Ulice Dash D 08/30/2016,12:39 PM

## 2016-08-30 NOTE — Telephone Encounter (Signed)
-----   Message from Wilhelmina Mcardle, MD sent at 08/30/2016 11:10 AM EDT ----- Please schedule post hosp F/U in 2-3 weeks with DK for paratracheal adenopathy for consideration of EBUS. I spoke with DK - may overbook  Thanks  Waunita Schooner

## 2016-08-30 NOTE — Progress Notes (Signed)
[facility] [place] NAME: Ruben [name]    MR#:  [id number]  DATE OF BIRTH:  01/22/1956  SUBJECTIVE:   Continues with sob. remains on 5 Lliter [place] oxygen Remains on Cardizem drip heart rate in the 110s blood pressure much better  REVIEW OF SYSTEMS:   Review of Systems  Constitutional: Positive for weight loss. Negative for chills and fever.  HENT: Negative for ear discharge, ear pain and nosebleeds.   Eyes: Negative for blurred vision, pain and discharge.  Respiratory: Positive for cough, sputum production and shortness of breath. Negative for wheezing and stridor.   Cardiovascular: Negative for chest pain, palpitations, orthopnea and PND.  Gastrointestinal: Negative for abdominal pain, diarrhea, nausea and vomiting.  Genitourinary: Negative for frequency and urgency.  Musculoskeletal: Negative for back pain and joint pain.  Neurological: Positive for weakness. Negative for sensory change, speech change and focal weakness.  Psychiatric/Behavioral: Negative for depression and hallucinations. The patient is not nervous/anxious.    Tolerating Diet:yes Tolerating PT: ambulatory  DRUG ALLERGIES:  No Known Allergies  VITALS:  Blood pressure 122/75, pulse (!) 101, temperature 97.6 F (36.4 C), temperature source Oral, resp. rate (!) 22, height '5\' 11"'$  (1.803 m), weight 65.7 kg (144 lb 13.5 oz), SpO2 95 %.  PHYSICAL EXAMINATION:   Physical Exam  GENERAL:  61 y.o.-year-old patient lying in the bed with no acute distress. Cachectic, thin EYES: Pupils equal, round, reactive to light and accommodation. No scleral icterus. Extraocular muscles intact.  HEENT: Head atraumatic, normocephalic. Oropharynx and nasopharynx clear.  NECK:  Supple, no jugular venous distention. No thyroid enlargement, no tenderness.  LUNGS: distant breath sounds bilaterally, no wheezing, rales, rhonchi.some use of accessory muscles of respiration.  CARDIOVASCULAR:  S1, S2 normal. No murmurs, rubs, or gallops. Severe tachycardia ABDOMEN: Soft, nontender, nondistended. Bowel sounds present. No organomegaly or mass.  EXTREMITIES: No cyanosis, clubbing or edema b/l.    NEUROLOGIC: Cranial nerves II through XII are intact. No focal Motor or sensory deficits b/l.   PSYCHIATRIC:  patient is alert and oriented x 3.  SKIN: No obvious rash, lesion, or ulcer.   LABORATORY PANEL:  CBC  Recent Labs Lab 08/30/16 0734  WBC 13.5*  HGB 13.9  HCT 40.6  PLT 392    Chemistries   Recent Labs Lab 08/30/16 0734  NA 137  K 4.8  CL 99*  CO2 34*  GLUCOSE 151*  BUN 23*  CREATININE 0.80  CALCIUM 8.9  MG 2.0   Cardiac Enzymes  Recent Labs Lab 08/25/16 0129  TROPONINI <0.03   RADIOLOGY:  Dg Chest Port 1 View  Result Date: 08/30/2016 CLINICAL DATA:  61 year old male with history of shortness of breath. EXAM: PORTABLE CHEST 1 VIEW COMPARISON:  Chest x-ray 08/25/2016. FINDINGS: Ill-defined opacity in the medial right lung base, which corresponds to the area of post infectious scarring in the right middle lobe seen on recent chest CT. Lungs are otherwise clear. Emphysematous changes. No pleural effusions. No evidence of pulmonary edema. Heart size is normal. Upper mediastinal contours are within normal limits. Aortic atherosclerosis. IMPRESSION: 1. Mild scarring in the medial aspect of the right middle lobe, similar to prior examinations. No definite radiographic evidence of acute cardiopulmonary disease. 2. Aortic atherosclerosis. Electronically Signed   By: Vinnie Langton M.D.   On: 08/30/2016 07:24   ASSESSMENT AND PLAN:  Ruben [name]  is a 61 y.o. male with a known history of CVA, COPD presented to the emergency room  with increased shortness of breath . Patient was brought by the EMS from home. He used 10 nebulizer treatments at home without relief and his oxygen saturation was 68% on arrival  1. Acute on chronic hypoxic respiratory failure due to severe  end stage COPD exacerbation with ongoing Tobacco abuse -IV solumederol -weaned off BIPAP. Currently on 5 liter [place]. sats 92-93% -pt has right sided atelectasis with possible right lung mass -Attempted for bronchoscopy by Dr Humphrey Rolls however canceled secondary to hypoxemia.  -Patient explained there will be no bronchoscopy done during this hospitalization given his rapid A. fib with RVR and still requiring significant amount of oxygen. Patient voiced understanding -CT chest worrisome for bronchogenic carcinoma -empiric abxs   2. Ongoing toabcoo abuse -advised smoking cessation >4 mins spent  3.Afib with RVR and hypotension Improving.  HR in the 170's--- now in the 100s -bp in the 80's IV ns bolus - on cardizem gtt Which is being weaned  -Patient on Lovenox 1 mg/kg subcutaneous twice a day for anticoagulation due to rapid A. fib with RVR -Cardiac consultation with Dr. Nehemiah Massed pending -Echo  4. Emphysema, chornic, severe -Patient will need oxygen for home.  Pt has poor prognosis. Spoke with ex-wife Ruben [name] [phone] and informed of his poor lung conition and overall prognosis  Transfer to 2 a  Case discussed with Care Management/Social Worker. Management plans discussed with the patient, family and they are in agreement.  CODE STATUS: FULL  DVT Prophylaxis: lovenox  TOTAL TIME TAKING CARE OF THIS PATIENT: *30* minutes.  >50% time spent on counselling and coordination of care   Note: This dictation was prepared with Dragon dictation along with smaller phrase technology. Any transcriptional errors that result from this process are unintentional.  [name] M.D on 08/30/2016 at 11:44 AM  Between 7am to 6pm - Pager - [phone]  After 6pm go to [email or web address] - password [email or web address] [facility]  Office  [phone]  CC: Primary care physician; Marden Noble, MD

## 2016-08-30 NOTE — Progress Notes (Signed)
Patient accepted from ICU.  Patient is alert and oriented.  Oriented to room.  Denies pain at this time.

## 2016-08-30 NOTE — Care Management (Addendum)
New O2 sats will need to be assessed (using template) for home O2. Patient has Medicaid and therefore no payer for home PT however nursing can be arranged with medical necessity. RNCM to assess need for cane.

## 2016-08-30 NOTE — Care Management Note (Signed)
Case Management Note  Patient Details  Name: Ruben [name] MRN: [id number] Date of Birth: 1955/05/05  Subjective/Objective:                  RNCM spoke with patient regarding discharge planning. He states he lives with a lady that used to be a Marine scientist. "We kinda take care of each other".  He states his PCP is Dr. Brynda Greathouse; saw about a month ago.  He drives. Independent. He requests cane to help with ambulation and PT supports that. He denies need for home health services. He denies problems paying for medication.   Action/Plan: Kasandra Knudsen provided from [facility]. O2 referral to [facility].   Expected Discharge Date:  08/26/16               Expected Discharge Plan:     In-House Referral:     Discharge planning Services  CM Consult  Post Acute Care Choice:  Durable Medical Equipment, Home Health Choice offered to:  Patient  DME Arranged:  Oxygen DME Agency:  [place]:  RN, Social Work, NA [place] Agency:     Status of Service:  In process, will continue to follow  If discussed at Long Length of Stay Meetings, dates discussed:    Additional Comments:  Marshell Garfinkel, RN 08/30/2016, 2:35 PM

## 2016-08-30 NOTE — Progress Notes (Signed)
No new complaints. Denies CP and dyspnea. Rattling cough  Vitals:   08/30/16 0800 08/30/16 0900 08/30/16 1000 08/30/16 1200  BP: 111/80 113/81 122/75 131/75  Pulse: (!) 59 82 (!) 101 90  Resp: 13 17 (!) 22 (!) 25  Temp: 97.6 F (36.4 C)   98.1 F (36.7 C)  TempSrc: Oral   Oral  SpO2: 96% 93% 95% 94%  Weight:      Height:      3 LPM [place]   NAD HEENT WNL No JVD noted Few scattered wheezes IRIR, no M noted NABS, soft No LE edema No focal neurological deficits  BMP Latest Ref Rng & Units 08/30/2016 08/26/2016 08/25/2016  Glucose 65 - 99 mg/dL 151(H) 144(H) -  BUN 6 - 20 mg/dL 23(H) 20 -  Creatinine 0.61 - 1.24 mg/dL 0.80 0.75 0.89  Sodium 135 - 145 mmol/L 137 141 -  Potassium 3.5 - 5.1 mmol/L 4.8 4.4 -  Chloride 101 - 111 mmol/L 99(L) 101 -  CO2 22 - 32 mmol/L 34(H) 32 -  Calcium 8.9 - 10.3 mg/dL 8.9 9.3 -   CBC Latest Ref Rng & Units 08/30/2016 08/26/2016 08/25/2016  WBC 3.8 - 10.6 K/uL 13.5(H) 12.6(H) 8.3  Hemoglobin 13.0 - 18.0 g/dL 13.9 12.9(L) 13.1  Hematocrit 40.0 - 52.0 % 40.6 38.1(L) 38.8(L)  Platelets 150 - 440 K/uL 392 283 225   CXR: emphysematous changes. Increased markings L base  IMPRESSION: COPD with severe emphysematous changes on CT chest Acute COPD exacerbation (mild) RML air space opacity - has appearance of resolving PNA (comparing to prior CT chest) Spiculated R apical nodule R paratracheal lymphadenopathy Concern for bronchogenic malignancy New onset AFRVR  PLAN/REC: Transition from IV to enteral diltiazem After off of IV diltiazem, transfer to telemetry Change enoxaparin to full dose for AF Cont treatment for AECOPD - nebulized steroids/BDs, augmentin. Change IV to PO steroids I have spoken with Dr Humphrey Rolls - we agree that EBUS should be performed but it is not urgent I have arranged for outpt follow up with Dr Mortimer Fries in 2-3 weeks to assess for EBUS  He will need anticoagulation held prior to bronchoscopy After transfer to telemetry, Dr Humphrey Rolls will  follow up for pulmonary issues  Merton Border, MD PCCM service Mobile [phone] Pager [phone] 08/30/2016 12:35 PM

## 2016-08-30 NOTE — Telephone Encounter (Signed)
LM for pt to return call to get appt scheduled. Pt can be scheduled 09/21/16 in a 30 min slot to see DK for a hosp f/u. Will await call back from pt.

## 2016-08-31 ENCOUNTER — Inpatient Hospital Stay
Admit: 2016-08-31 | Discharge: 2016-08-31 | Disposition: A | Discharge status: Home / Self Care | Payer: Medicaid Other | Attending: Pulmonary Disease | Admitting: Pulmonary Disease

## 2016-08-31 MED ORDER — APIXABAN 5 MG PO TABS
5.0000 mg | ORAL_TABLET | Freq: Two times a day (BID) | ORAL | Status: DC
  Administered 2016-08-31 – 2016-09-01 (×3): 5 mg via ORAL
  Filled 2016-08-31 (×3): qty 1

## 2016-08-31 MED ORDER — DIGOXIN 0.25 MG/ML IJ SOLN
0.5000 mg | Freq: Once | INTRAMUSCULAR | Status: AC
  Administered 2016-08-31: 0.5 mg via INTRAVENOUS
  Filled 2016-08-31: qty 2

## 2016-08-31 MED ORDER — DIGOXIN 0.25 MG/ML IJ SOLN
0.2500 mg | Freq: Four times a day (QID) | INTRAMUSCULAR | Status: AC
  Administered 2016-08-31 (×2): 0.25 mg via INTRAVENOUS
  Filled 2016-08-31 (×2): qty 1

## 2016-08-31 MED ORDER — DIGOXIN 250 MCG PO TABS
0.2500 mg | ORAL_TABLET | Freq: Every day | ORAL | Status: DC
  Administered 2016-09-01: 0.25 mg via ORAL
  Filled 2016-08-31: qty 1

## 2016-08-31 MED ORDER — METOPROLOL TARTRATE 25 MG PO TABS
25.0000 mg | ORAL_TABLET | Freq: Two times a day (BID) | ORAL | Status: DC
  Administered 2016-08-31 – 2016-09-01 (×3): 25 mg via ORAL
  Filled 2016-08-31 (×3): qty 1

## 2016-08-31 NOTE — Progress Notes (Signed)
[facility] at [place] NAME: Ruben [name]    MR#:  [id number]  DATE OF BIRTH:  [birth year]  SUBJECTIVE:   Continues with sob. remains on 5 Lliter [place] oxygen Feels a lot better. HR still in the 130's at rest REVIEW OF SYSTEMS:   Review of Systems  Constitutional: Positive for weight loss. Negative for chills and fever.  HENT: Negative for ear discharge, ear pain and nosebleeds.   Eyes: Negative for blurred vision, pain and discharge.  Respiratory: Positive for cough, sputum production and shortness of breath. Negative for wheezing and stridor.   Cardiovascular: Negative for chest pain, palpitations, orthopnea and PND.  Gastrointestinal: Negative for abdominal pain, diarrhea, nausea and vomiting.  Genitourinary: Negative for frequency and urgency.  Musculoskeletal: Negative for back pain and joint pain.  Neurological: Positive for weakness. Negative for sensory change, speech change and focal weakness.  Psychiatric/Behavioral: Negative for depression and hallucinations. The patient is not nervous/anxious.    Tolerating Diet:yes Tolerating PT: ambulatory  DRUG ALLERGIES:  No Known Allergies  VITALS:  Blood pressure (!) 107/59, pulse 74, temperature 97.7 F (36.5 C), temperature source Oral, resp. rate 20, height '5\' 11"'$  (1.803 m), weight 65.7 kg (144 lb 13.5 oz), SpO2 95 %.  PHYSICAL EXAMINATION:   Physical Exam  GENERAL:  61 y.o.-year-old patient lying in the bed with no acute distress. Cachectic, thin EYES: Pupils equal, round, reactive to light and accommodation. No scleral icterus. Extraocular muscles intact.  HEENT: Head atraumatic, normocephalic. Oropharynx and nasopharynx clear.  NECK:  Supple, no jugular venous distention. No thyroid enlargement, no tenderness.  LUNGS: distant breath sounds bilaterally, no wheezing, rales, rhonchi.some use of accessory muscles of respiration.  CARDIOVASCULAR: S1, S2 normal. No murmurs,  rubs, or gallops. Severe tachycardia ABDOMEN: Soft, nontender, nondistended. Bowel sounds present. No organomegaly or mass.  EXTREMITIES: No cyanosis, clubbing or edema b/l.    NEUROLOGIC: Cranial nerves II through XII are intact. No focal Motor or sensory deficits b/l.   PSYCHIATRIC:  patient is alert and oriented x 3.  SKIN: No obvious rash, lesion, or ulcer.   LABORATORY PANEL:  CBC  Recent Labs Lab 08/30/16 0734  WBC 13.5*  HGB 13.9  HCT 40.6  PLT 392    Chemistries   Recent Labs Lab 08/30/16 0734  NA 137  K 4.8  CL 99*  CO2 34*  GLUCOSE 151*  BUN 23*  CREATININE 0.80  CALCIUM 8.9  MG 2.0   Cardiac Enzymes  Recent Labs Lab 08/25/16 0129  TROPONINI <0.03   RADIOLOGY:  Dg Chest Port 1 View  Result Date: 08/30/2016 CLINICAL DATA:  61 year old male with history of shortness of breath. EXAM: PORTABLE CHEST 1 VIEW COMPARISON:  Chest x-ray 08/25/2016. FINDINGS: Ill-defined opacity in the medial right lung base, which corresponds to the area of post infectious scarring in the right middle lobe seen on recent chest CT. Lungs are otherwise clear. Emphysematous changes. No pleural effusions. No evidence of pulmonary edema. Heart size is normal. Upper mediastinal contours are within normal limits. Aortic atherosclerosis. IMPRESSION: 1. Mild scarring in the medial aspect of the right middle lobe, similar to prior examinations. No definite radiographic evidence of acute cardiopulmonary disease. 2. Aortic atherosclerosis. Electronically Signed   By: Vinnie Langton M.D.   On: 08/30/2016 07:24   ASSESSMENT AND PLAN:  Ruben [name]  is a 60 y.o. male with a known history of CVA, COPD presented to the emergency room with increased shortness of  breath . Patient was brought by the EMS from home. He used 10 nebulizer treatments at home without relief and his oxygen saturation was 68% on arrival  1. Acute on chronic hypoxic respiratory failure due to severe end stage COPD exacerbation  with ongoing Tobacco abuse -IV solumederol -weaned off BIPAP. Currently on 5 liter [place]. sats 92-93% -pt has right sided atelectasis with possible right lung mass -Attempted for bronchoscopy by Dr Humphrey Rolls however canceled secondary to hypoxemia.  -Patient explained there will be no bronchoscopy done during this hospitalization given his rapid A. fib with RVR and still requiring significant amount of oxygen. Patient voiced understanding -CT chest worrisome for bronchogenic carcinoma -empiric abxs   2. Ongoing toabcoo abuse -advised smoking cessation >4 mins spent  3.Afib with RVR and hypotension Improving.  HR in the 170's--- now in the 100s rto 130'a - was on cardizem gtt -po Cardizem CD 240 mg. Add IV diogixin 0.5 mg x1 and 0.25 mg x2 doses and then 0.25 mg po daily from tomorrow -on po eliquis now for anticoagulation -Cardiac consultation with Dr. Clayborn Bigness -Echo results pending  4. Emphysema, chornic, severe -Patient will need oxygen for home.  Pt has poor prognosis. Spoke with ex-wife Lorelle Gibbs [phone] and informed of his poor lung conition and overall prognosis  D/w pt code status and he wishes to be FULL CODE  Case discussed with Care Management/Social Worker. Management plans discussed with the patient, family and they are in agreement.  CODE STATUS: FULL  DVT Prophylaxis: eliquis  TOTAL TIME TAKING CARE OF THIS PATIENT: *30* minutes.  >50% time spent on counselling and coordination of care   Note: This dictation was prepared with Dragon dictation along with smaller phrase technology. Any transcriptional errors that result from this process are unintentional.  Senica Crall M.D on 08/31/2016 at 9:02 AM  Between 7am to 6pm - Pager - [phone]  After 6pm go to [email or web address] - password [email or web address] [facility]  Office  [phone]  CC: Primary care physician; Marden Noble, MD

## 2016-08-31 NOTE — Progress Notes (Signed)
HR getting up into 140-150s. PRN IV Metoprolol given. Will continue to monitor.  Iran Sizer M

## 2016-08-31 NOTE — Care Management (Addendum)
Barrier to discharge- uncontrolled heart rate. Intravenous Digoxin did not resolve.  Responded to Metoprolol IV.  Discussed performing home 02 assessment.  At present patient is not able to participate in the assessment due to heart rate.  found patient has chronic oxygen at home from [place]

## 2016-08-31 NOTE — Progress Notes (Signed)
PT Cancellation Note  Patient Details Name: Ruben [name] MRN: [id number] DOB: 1955-11-10   Cancelled Treatment:    Reason Eval/Treat Not Completed: Other (comment).  Pt admitted to hospital 08/25/16 with respiratory failure secondary to PNA and requiring Bipap; pt then transferred from CCU to medical floor.  Initially evaluated by PT 08/28/16.  Pt then transferred back to CCU 08/29/16 d/t a-fib with RVR and hypotension.  Pt now transferred back to medical floor.  Discussed pt's current status with nursing who reports pt has had difficulties with uncontrolled heart rate today and nursing recommending holding PT at this time.  Will re-attempt PT session at a later date/time.  Leitha Bleak, PT 08/31/16, 2:55 PM [phone]

## 2016-08-31 NOTE — Progress Notes (Signed)
*  PRELIMINARY RESULTS* Echocardiogram 2D Echocardiogram has been performed.  Ruben [name] 08/31/2016, 8:40 AM

## 2016-08-31 NOTE — Progress Notes (Signed)
Spoke with dr. Posey Pronto to make aware iv digoxin did not touch patients heart rate.patients heart rates 130-160.  Iv '5mg'$  metoprolol was given heart rate 110-130. Occasionally up to 140 unsustained. Per md order metoprolol '25mg'$  po bid. Give first dose at noon. Will continue to monitor closely

## 2016-08-31 NOTE — Telephone Encounter (Signed)
Called and spoke with Maudie Mercury at [facility] for nurse's station for room 239A and gave a hosp f/u appt for pulmonary for 09/21/16 to arrive at 10am for a 10:15am with Dr. Mortimer Fries. Nothing further needed.

## 2016-09-01 MED ORDER — DILTIAZEM HCL ER COATED BEADS 360 MG PO CP24: 360.0000 mg | ORAL_CAPSULE | Freq: Every day | ORAL | 11 refills | Status: DC

## 2016-09-01 MED ORDER — METOPROLOL TARTRATE 50 MG PO TABS: 50.0000 mg | ORAL_TABLET | Freq: Two times a day (BID) | ORAL | 11 refills | Status: DC

## 2016-09-01 MED ORDER — DILTIAZEM HCL 25 MG/5ML IV SOLN: 10.0000 mg | Freq: Once | INTRAVENOUS | Status: DC

## 2016-09-01 MED ORDER — AMOXICILLIN-POT CLAVULANATE 875-125 MG PO TABS: 1.0000 | ORAL_TABLET | Freq: Two times a day (BID) | ORAL | 0 refills | Status: DC

## 2016-09-01 MED ORDER — DIGOXIN 250 MCG PO TABS: 0.2500 mg | ORAL_TABLET | Freq: Every day | ORAL | 0 refills | Status: DC

## 2016-09-01 MED ORDER — APIXABAN 5 MG PO TABS: 5.0000 mg | ORAL_TABLET | Freq: Two times a day (BID) | ORAL | 0 refills | Status: DC

## 2016-09-01 MED ORDER — BUDESONIDE 0.5 MG/2ML IN SUSP: 0.5000 mg | Freq: Two times a day (BID) | RESPIRATORY_TRACT | 12 refills | Status: DC

## 2016-09-01 MED ORDER — CLONAZEPAM 0.5 MG PO TABS: 0.5000 mg | ORAL_TABLET | Freq: Three times a day (TID) | ORAL | 0 refills | Status: DC | PRN

## 2016-09-01 MED ORDER — PREDNISONE 10 MG (21) PO TBPK: ORAL_TABLET | ORAL | 0 refills | Status: DC

## 2016-09-01 NOTE — Consult Note (Signed)
Reason for Consult: Atrial fibrillation shortness of breath dyspnea Referring Physician:Dr  Ruben [name]  primary , Ruben [name] hospitalist  Ruben [name] is an 61 y.o. male.  HPI: Patient has a history of lung cancer COPD smoking shortness of breath undergoing chemotherapy radiation recently had a course of Coumadin started having worsening dyspnea shortness of breath came to emergency room was thought to possibly have pneumonia and was treated with inhalers and antibiotics and respiratory support and aggressive hospitalization patient developed atrial fibrillation rapid ventricular response and now is being placed on Cardizem also been recommended for short-term anticoagulation. Patient denies previous atrial fibrillation denies any chest pain. Patient looks cachectic week denies any chest pain no blackout spells or syncope denies any significant palpitations or tachycardia with heart rates of 120.  Past Medical History:  Diagnosis Date  . COPD (chronic obstructive pulmonary disease) ([place])   . Coronary artery disease   . Depression   . Hypertension   . Myocardial infarction ([place])   . Stroke [facility])     Past Surgical History:  Procedure Laterality Date  . CARDIAC CATHETERIZATION    . ELBOW SURGERY Left   . FLEXIBLE BRONCHOSCOPY Bilateral 08/27/2016   Procedure: FLEXIBLE BRONCHOSCOPY;  Surgeon: Allyne Gee, MD;  Location: [facility] ORS;  Service: Pulmonary;  Laterality: Bilateral;  . WRIST SURGERY Left     Family History  Problem Relation Age of Onset  . Heart disease Unknown   . Hypertension Unknown   . Diabetes Unknown   . CAD Mother     Social History:  reports that he has been smoking Cigarettes.  He has a 43.00 pack-year smoking history. He has never used smokeless tobacco. He reports that he uses drugs, including Marijuana. He reports that he does not drink alcohol.  Allergies: No Known Allergies  Medications: I have reviewed the patient's current medications.  No results  found for this or any previous visit (from the past 48 hour(s)).  No results found.  Review of Systems  Constitutional: Positive for diaphoresis, malaise/fatigue and weight loss.  HENT: Positive for congestion.   Eyes: Negative.   Respiratory: Positive for cough, shortness of breath and wheezing.   Cardiovascular: Positive for palpitations, leg swelling and PND.  Gastrointestinal: Negative.   Genitourinary: Negative.   Musculoskeletal: Negative.   Skin: Negative.   Neurological: Positive for weakness.  Endo/Heme/Allergies: Negative.   Psychiatric/Behavioral: Negative.    Blood pressure 114/70, pulse (!) 105, temperature 98 F (36.7 C), temperature source Oral, resp. rate 18, height _0  (1.803 m), weight 65.7 kg (144 lb 13.5 oz), SpO2 90 %. Physical Exam  Nursing note and vitals reviewed. Constitutional: He appears cachectic. He is cooperative. He has a sickly appearance. He appears ill.  HENT:  Head: Normocephalic and atraumatic.  Eyes: Conjunctivae and EOM are normal. Pupils are equal, round, and reactive to light.  Neck: Normal range of motion. Neck supple.  Respiratory: He has decreased breath sounds. He has rhonchi.  GI: Soft. Bowel sounds are normal.  Musculoskeletal: Normal range of motion.  Neurological: He is alert.  Skin: Skin is warm and dry.  Psychiatric: He has a normal mood and affect.    Assessment/Plan: Atrial fibrillation RVR Tachycardia  Pneumonia Shortness of breath History of CVA Hypoxemia History of lung cancer Congestion Congestive failure Smoking COPD Anxiety Hyperlipidemia Status post chemoradiation . Plan Agree with rate control for atrial fibrillation recommend Cardizem discontinuing amlodipine Consider short-term anticoagulation Agree with broad-spectrum antibiotic therapy for possible pneumonia Continue steroid  taper for COPD exacerbation and bronchitis Maintain some sent therapy for hyperlipidemia Continue inhalers for COPD  bronchitis Consider pulmonary input for management of severe lung disease Follow-up with oncology recent chemotherapy for lung cancer Maintain omeprazole therapy for reflux symptoms Advised patient to refrain from tobacco abuse Consider echocardiogram for assessment of shortness of breath rate control for atrial fibrillation  Ruben [name] 09/01/2016, 8:46 AM

## 2016-09-01 NOTE — Progress Notes (Signed)
Per Hank PharmD, we do not have IV cardizem. Spoke to Dr. Posey Pronto, instructed to give patient's PRN dose for 5mg  IV meoprolol once and then discharge patient.

## 2016-09-01 NOTE — Progress Notes (Signed)
SATURATION QUALIFICATIONS: (This note is used to comply with regulatory documentation for home oxygen)  Patient Saturations on Room Air at Rest = 95%  Patient Saturations on Room Air while Ambulating = 92%  Patient Saturations on n/a Liters of oxygen while Ambulating = n/a%  Please briefly explain why patient needs home oxygen: n/a

## 2016-09-01 NOTE — Progress Notes (Signed)
Advanced care plan.  Purpose of the Encounter: CODE STATUS  Parties in Attendance:Patient him self, I called his wife on the phone  Patient's Decision Capacity:intact  Subjective/Patient's story: Patient is a 61 year old white male who was admitted for acute respiratory failure on chronic respiratory failure due to severe COPD as well as lung mass. Patient also noticed to be having A. fib with RVR. His heart rate is not controlled he wants to go home despite this. I discussed with them resuscitation and intubation.  Objective/Medical story I discussed with them resuscitation and intubation. He states that he does not want resuscitation or intubation he would not like to be placed on any type of machine.   Goals of care determination:  CODE STATUS changed to a DO NOT RESUSCITATE   CODE STATUS: DO NOT RESUSCITATE   Time spent discussing advanced care planning: 15 minutes

## 2016-09-01 NOTE — Care Management Note (Signed)
Case Management Note  Patient Details  Name: Ruben [name] MRN: [id number] Date of Birth: [birth year]  Subjective/Objective:                 Patient is insistent with attending that he wants to go home.  Notified [facility] that patient states very firmly that he does not have home oxygen but does have a home nebulizer through [facility]. Home oxygen assessment performed and maintains exertional room air sats of 93%.  He is firm in his decline for home health nurse follow up. Discussed concerns about his heart rate and benefit of having a nurse monitor due to issues with controlling the rate during this hospitalization. Discussed that attending feels he may benefit from a continued stay He says he is appreciative but his room mate is a Marine scientist and she can help watch things.   Action/Plan:    Updated attending regarding decline of home health nurse.    Expected Discharge Date:  09/01/16               Expected Discharge Plan:     In-House Referral:     Discharge planning Services  CM Consult  Post Acute Care Choice:  Durable Medical Equipment Choice offered to:  Patient  DME Arranged:  Oxygen DME Agency:  [place]:    [facility] Agency:     Status of Service:  In process, will continue to follow  If discussed at Long Length of Stay Meetings, dates discussed:    Additional Comments:  Katrina Stack, RN 09/01/2016, 9:44 AM

## 2016-09-01 NOTE — Progress Notes (Signed)
Patient given discharge teaching and paperwork regarding medications, diet, follow-up appointments and activity. Patient understanding verbalized. No complaints at this time. IV and telemetry discontinued prior to leaving. Skin assessment as previously charted and vitals are stable; on room air. Patient being discharged to home.  No further needs by Care Management. Prescriptions given to patient. Friend taking patient home.

## 2016-09-01 NOTE — Progress Notes (Signed)
Order for 10mg  IV cardizem now. MD aware that patient just had all of his daily PO meds. Heart rate jumping to 170s while walking with MD supervision. Will continue to monitor.

## 2016-09-01 NOTE — Progress Notes (Signed)
Patient's heart rate continues to be very poorly controlled. He insisted on going home. I explained to him that this would not be safe. He reports that he wants to go home and relax. He has changed his CODE STATUS to DO NOT RESUSCITATE.

## 2016-09-01 NOTE — Discharge Summary (Signed)
[facility] - [place] at [facility], [place] y.o., DOB 11-Jun-1955, MRN [id number]. Admission date: 08/25/2016 Discharge Date 09/01/2016 Primary MD Marden Noble, MD Admitting Physician Saundra Shelling, MD  Admission Diagnosis  SOB (shortness of breath) [R06.02] Healthcare-associated pneumonia [J18.9] COPD exacerbation ([place]) [J44.1]  Discharge Diagnosis   Active Problems:   Pneumonia   Acute on chronic respiratory failure due to severe end-stage COPD   Acute on chronic COPD exasperation   Ongoing tobacco abuse   A. fib with RVR   possible possible lung mass unable to undergo bronchoscopy due to respiratory status will need outpatient follow-up        Hospital Course MarkJeffriesis a 61 y.o.malewith a known history of CVA, COPD presented to the emergency room with increased shortness of breath .Patient was brought by the EMS from home.He used 10 nebulizer treatments at home without relief and his oxygen saturation was 68% on arrival. Patient was admitted for acute on chronic COPD exasperation he was treated with nebulizers and steroids. Initially requiring BiPAP which was subsequently weaned off. Patient developed atrial fibrillation with rapid ventricular rate to his heart rate was very poorly controlled. Patient was started on Cardizem with metoprolol and digoxin and his heart rate continues to be under poor control. Today his heart rate continues to be elevated in the 130s 140s worse with activity. Patient insisted to go home. I explained to him that this could be dangerous. And would require readmission. He states that he knows he has a pretty severe lung problem and wishes to be a DO NOT RESUSCITATE. I have adjusted his A. fib medications to see if that would help with his heart rate.          Consults  cardiology , pulmonary Significant Tests:  See full reports for all details     Ct Chest W Contrast  Result Date: 08/26/2016 CLINICAL DATA:   Chronic hypoxic respiratory failure. Ongoing tobacco abuse. Possible lung mass. EXAM: CT CHEST WITH CONTRAST TECHNIQUE: Multidetector CT imaging of the chest was performed during intravenous contrast administration. CONTRAST:  40m ISOVUE-300 IOPAMIDOL (ISOVUE-300) INJECTION 61% COMPARISON:  06/23/2016. FINDINGS: Cardiovascular: The heart size appears normal. No pericardial effusion. Aortic atherosclerosis. Mediastinum/Nodes: The trachea appears patent and is midline. Normal appearance of the esophagus. Enlarged right paratracheal lymph node measures 2 cm, image 65 of series 2. Previously this measured the same. Sub- carinal lymph node is also enlarged measuring 1.2 cm. Right hilar node measures 1.4 cm. Lungs/Pleura: Moderate to advanced changes of centrilobular emphysema. Improving aeration to the right middle lobe is identified with residual subsegmental atelectasis. Multifocal areas of patchy ground-glass and nodular airspace consolidation identified bilaterally in a peribronchial distribution likely post infectious or inflammatory in etiology. New ground-glass attenuating lesion within the anteromedial left upper lobe measures 2.4 cm and is likely postinflammatory or infectious in etiology. Upper Abdomen: No acute abnormality. Musculoskeletal: No aggressive lytic or sclerotic bone lesions. IMPRESSION: 1. Persistent enlarged right paratracheal, right hilar and sub- carinal lymph nodes worrisome for malignancy. Favor primary bronchogenic carcinoma. Further evaluation with PET-CT and tissue sampling is advised. 2. Improved aeration to the right middle lobe. 3. Bilateral multifocal patchy peribronchial opacities are identified. Likely postinflammatory or infectious in etiology. Attention on follow-up imaging advise. 4. New ground-glass attenuating nodule in the anteromedial left upper lobe measuring 2.4 cm. Also likely postinflammatory. Attention to this nodule on follow-up exam is advised. 5. Aortic Atherosclerosis  (ICD10-I70.0) and Emphysema (ICD10-J43.9). Electronically Signed   By:  Kerby Moors M.D.   On: 08/26/2016 10:12   Dg Chest Port 1 View  Result Date: 08/30/2016 CLINICAL DATA:  61 year old male with history of shortness of breath. EXAM: PORTABLE CHEST 1 VIEW COMPARISON:  Chest x-ray 08/25/2016. FINDINGS: Ill-defined opacity in the medial right lung base, which corresponds to the area of post infectious scarring in the right middle lobe seen on recent chest CT. Lungs are otherwise clear. Emphysematous changes. No pleural effusions. No evidence of pulmonary edema. Heart size is normal. Upper mediastinal contours are within normal limits. Aortic atherosclerosis. IMPRESSION: 1. Mild scarring in the medial aspect of the right middle lobe, similar to prior examinations. No definite radiographic evidence of acute cardiopulmonary disease. 2. Aortic atherosclerosis. Electronically Signed   By: Vinnie Langton M.D.   On: 08/30/2016 07:24   Dg Chest Portable 1 View  Result Date: 08/25/2016 CLINICAL DATA:  Chest pain and shortness of breath EXAM: PORTABLE CHEST 1 VIEW COMPARISON:  Chest radiograph 06/23/2016 FINDINGS: There is improved aeration of the right middle lobe, though there is still some residual opacity. The lungs are hyperinflated with diffusely increased pulmonary markings. No pleural effusion or pneumothorax. No new area of consolidation. No pulmonary edema. Normal cardiomediastinal contours. IMPRESSION: 1. Improved aeration of the right middle lobe with mild residual opacity. 2. Hyperinflated lungs consistent with COPD. Electronically Signed   By: Ulyses Jarred M.D.   On: 08/25/2016 02:05       Today   Subjective:   Ruben [name] very anxious to go home  Objective:   Blood pressure 128/83, pulse (!) 115, temperature 98 F (36.7 C), temperature source Oral, resp. rate 18, height '5\' 11"'$  (1.803 m), weight 144 lb 13.5 oz (65.7 kg), SpO2 91 %.  .  Intake/Output Summary (Last 24 hours) at  09/01/16 1531 Last data filed at 09/01/16 0917  Gross per 24 hour  Intake              480 ml  Output             1375 ml  Net             -895 ml    Exam VITAL SIGNS: Blood pressure 128/83, pulse (!) 115, temperature 98 F (36.7 C), temperature source Oral, resp. rate 18, height '5\' 11"'$  (1.803 m), weight 144 lb 13.5 oz (65.7 kg), SpO2 91 %.  GENERAL:  61 y.o.-year-old patient lying in the bed with no acute distress.  EYES: Pupils equal, round, reactive to light and accommodation. No scleral icterus. Extraocular muscles intact.  HEENT: Head atraumatic, normocephalic. Oropharynx and nasopharynx clear.  NECK:  Supple, no jugular venous distention. No thyroid enlargement, no tenderness.  LUNGS:Decreased breath sounds  bilaterally, no wheezing, rales,rhonchi or crepitation. No use of accessory muscles of respiration.  CARDIOVASCULAR:Regularly irregular tachycardic  l. No murmurs, rubs, or gallops.  ABDOMEN: Soft, nontender, nondistended. Bowel sounds present. No organomegaly or mass.  EXTREMITIES: No pedal edema, cyanosis, or clubbing.  NEUROLOGIC: Cranial nerves II through XII are intact. Muscle strength 5/5 in all extremities. Sensation intact. Gait not checked.  PSYCHIATRIC: The patient is alert and oriented x 3.  SKIN: No obvious rash, lesion, or ulcer.   Data Review     CBC w Diff: Lab Results  Component Value Date   WBC 13.5 (H) 08/30/2016   HGB 13.9 08/30/2016   HGB 15.1 06/01/2013   HCT 40.6 08/30/2016   HCT 44.5 06/01/2013   PLT 392 08/30/2016   PLT 147 (L)  06/01/2013   LYMPHOPCT 9.3 06/01/2013   MONOPCT 10.6 06/01/2013   EOSPCT 0.3 06/01/2013   BASOPCT 0.7 06/01/2013   CMP: Lab Results  Component Value Date   NA 137 08/30/2016   NA 137 06/02/2013   K 4.8 08/30/2016   K 4.4 06/02/2013   CL 99 (L) 08/30/2016   CL 103 06/02/2013   CO2 34 (H) 08/30/2016   CO2 33 (H) 06/02/2013   BUN 23 (H) 08/30/2016   BUN 12 06/02/2013   CREATININE 0.80 08/30/2016   CREATININE  0.78 06/02/2013   PROT 6.4 (L) 06/29/2015   PROT 8.0 06/01/2013   ALBUMIN 3.2 (L) 06/29/2015   ALBUMIN 3.6 06/01/2013   BILITOT 0.5 06/29/2015   BILITOT 0.3 06/01/2013   ALKPHOS 50 06/29/2015   ALKPHOS 101 06/01/2013   AST 24 06/29/2015   AST 32 06/01/2013   ALT 16 (L) 06/29/2015   ALT 18 06/01/2013  .  Micro Results Recent Results (from the past 240 hour(s))  MRSA PCR Screening     Status: None   Collection Time: 08/25/16  6:31 AM  Result Value Ref Range Status   MRSA by PCR NEGATIVE NEGATIVE Final    Comment:        The GeneXpert MRSA Assay (FDA approved for NASAL specimens only), is one component of a comprehensive MRSA colonization surveillance program. It is not intended to diagnose MRSA infection nor to guide or monitor treatment for MRSA infections.   Culture, blood (Routine X 2) w Reflex to ID Panel     Status: None   Collection Time: 08/25/16  7:15 AM  Result Value Ref Range Status   Specimen Description BLOOD R AC  Final   Special Requests   Final    BOTTLES DRAWN AEROBIC AND ANAEROBIC Blood Culture adequate volume   Culture NO GROWTH 5 DAYS  Final   Report Status 08/30/2016 FINAL  Final  Culture, blood (Routine X 2) w Reflex to ID Panel     Status: None   Collection Time: 08/25/16  7:24 AM  Result Value Ref Range Status   Specimen Description BLOOD L HAND  Final   Special Requests   Final    BOTTLES DRAWN AEROBIC AND ANAEROBIC Blood Culture adequate volume   Culture NO GROWTH 5 DAYS  Final   Report Status 08/30/2016 FINAL  Final  Urine culture     Status: Abnormal   Collection Time: 08/25/16  9:34 AM  Result Value Ref Range Status   Specimen Description URINE, RANDOM  Final   Special Requests NONE  Final   Culture (A)  Final    <10,000 COLONIES/mL INSIGNIFICANT GROWTH Performed at [facility], [place] [address] [place], [place] [address]    Report Status 08/26/2016 FINAL  Final  Culture, expectorated sputum-assessment     Status: None    Collection Time: 08/26/16 12:45 PM  Result Value Ref Range Status   Specimen Description EXPECTORATED SPUTUM  Final   Special Requests NONE  Final   Sputum evaluation THIS SPECIMEN IS ACCEPTABLE FOR SPUTUM CULTURE  Final   Report Status 08/26/2016 FINAL  Final  Culture, respiratory (NON-Expectorated)     Status: None   Collection Time: 08/26/16 12:45 PM  Result Value Ref Range Status   Specimen Description EXPECTORATED SPUTUM  Final   Special Requests NONE Reflexed from [id number]  Final   Gram Stain   Final    FEW WBC PRESENT,BOTH PMN AND MONONUCLEAR NO ORGANISMS SEEN Performed at [facility], [place] [address], [place], [place] [address]    Culture RARE STAPHYLOCOCCUS SPECIES (COAGULASE NEGATIVE)  Final   Report Status 08/29/2016 FINAL  Final  MRSA PCR Screening     Status: None   Collection Time: 08/29/16 12:25 PM  Result Value Ref Range Status   MRSA by PCR NEGATIVE NEGATIVE Final    Comment:        The GeneXpert MRSA Assay (FDA approved for NASAL specimens only), is one component of a comprehensive MRSA colonization surveillance program. It is not intended to diagnose MRSA infection nor to guide or monitor treatment for MRSA infections.         Code Status Orders        Start     Ordered   08/25/16 0627  Full code  Continuous     08/25/16 0626    Code Status History    Date Active Date Inactive Code Status Order ID Comments User Context   06/23/2016  6:56 PM 06/26/2016  3:14 PM Full Code [id number]  Gladstone Lighter, MD Inpatient   06/28/2015  1:09 PM 07/01/2015  7:36 PM Full Code [id number]  Bettey Costa, MD Inpatient   06/28/2015 11:09 AM 06/28/2015 11:15 AM DNR [id number]  Bettey Costa, MD ED   09/25/2014  4:34 AM 09/26/2014  2:41 PM DNR [id number]  Lance Coon, MD Inpatient          Follow-up Information    Flora Lipps, MD On 09/21/2017.   Specialties:  Pulmonary Disease, Cardiology Why:  arrive at  [facility] Follow Up Contact information: [place] [address] [phone]           Discharge Medications   Allergies as of 09/01/2016   No Known Allergies     Medication List    STOP taking these medications   amLODipine 10 MG tablet Commonly known as:  NORVASC     TAKE these medications   ADVAIR DISKUS 250-50 MCG/DOSE Aepb Generic drug:  Fluticasone-Salmeterol Inhale 1 puff into the lungs 2 (two) times daily.   albuterol 108 (90 Base) MCG/ACT inhaler Commonly known as:  PROVENTIL HFA;VENTOLIN HFA Inhale 2 puffs into the lungs every 6 (six) hours as needed for wheezing or shortness of breath.   amoxicillin-clavulanate 875-125 MG tablet Commonly known as:  AUGMENTIN Take 1 tablet by mouth every 12 (twelve) hours.   apixaban 5 MG Tabs tablet Commonly known as:  ELIQUIS Take 1 tablet (5 mg total) by mouth 2 (two) times daily.   benzonatate 100 MG capsule Commonly known as:  TESSALON Take 1 capsule (100 mg total) by mouth 3 (three) times daily.   budesonide 0.5 MG/2ML nebulizer solution Commonly known as:  PULMICORT Take 2 mLs (0.5 mg total) by nebulization 2 (two) times daily.   clonazePAM 0.5 MG tablet Commonly known as:  KLONOPIN Take 1 tablet (0.5 mg total) by mouth 3 (three) times daily as needed. For anxiety What changed:  when to take this   digoxin 0.25 MG tablet Commonly known as:  LANOXIN Take 1 tablet (0.25 mg total) by mouth daily. Start taking on:  09/02/2016   diltiazem 360 MG 24 hr capsule Commonly known as:  CARDIZEM CD Take 1 capsule (360 mg total) by mouth daily.   escitalopram 20 MG tablet Commonly known as:  LEXAPRO Take 1 tablet (20 mg total) by mouth every morning.   guaiFENesin 600 MG 12 hr tablet Commonly known as:  MUCINEX Take 1  tablet (600 mg total) by mouth 2 (two) times daily.   ipratropium-albuterol 0.5-2.5 (3) MG/3ML Soln Commonly known as:  DUONEB Take 3 mLs by nebulization 4 (four) times daily as needed. For shortness of  breath/wheezing.   metoprolol tartrate 50 MG tablet Commonly known as:  LOPRESSOR Take 1 tablet (50 mg total) by mouth 2 (two) times daily.   mirtazapine 15 MG tablet Commonly known as:  REMERON Take 1 tablet (15 mg total) by mouth at bedtime. What changed:  how much to take   nicotine 21 mg/24hr patch Commonly known as:  NICODERM CQ - dosed in mg/24 hours Place 1 patch (21 mg total) onto the skin daily.   omeprazole 40 MG capsule Commonly known as:  PRILOSEC Take 40 mg by mouth daily.   predniSONE 10 MG (21) Tbpk tablet Commonly known as:  STERAPRED UNI-PAK 21 TAB Start at '60mg'$  taper by '10mg'$  until complete   simvastatin 20 MG tablet Commonly known as:  ZOCOR Take 20 mg by mouth every morning.   traZODone 150 MG tablet Commonly known as:  DESYREL Take 150 mg by mouth at bedtime.          Total Time in preparing paper work, data evaluation and todays exam - 35 minutes  Dustin Flock M.D on 09/01/2016 at 3:31 PM  [facility]   Office  [phone]

## 2016-09-09 LAB — ECHOCARDIOGRAM COMPLETE
HEIGHTINCHES: 71 in
Weight: 2317.48 oz

## 2016-09-21 ENCOUNTER — Inpatient Hospital Stay: Payer: Medicaid Other | Admitting: Internal Medicine

## 2017-04-21 ENCOUNTER — Emergency Department
Admission: EM | Admit: 2017-04-21 | Discharge: 2017-04-21 | Disposition: A | Discharge status: Home / Self Care | Payer: Medicaid Other | Attending: Emergency Medicine | Admitting: Emergency Medicine

## 2017-04-21 ENCOUNTER — Encounter: Payer: Self-pay | Admitting: Emergency Medicine

## 2017-04-21 ENCOUNTER — Other Ambulatory Visit: Payer: Self-pay

## 2017-04-21 ENCOUNTER — Emergency Department: Payer: Medicaid Other

## 2017-04-21 DIAGNOSIS — I252 Old myocardial infarction: Secondary | ICD-10-CM | POA: Diagnosis not present

## 2017-04-21 DIAGNOSIS — R079 Chest pain, unspecified: Secondary | ICD-10-CM | POA: Diagnosis not present

## 2017-04-21 DIAGNOSIS — J441 Chronic obstructive pulmonary disease with (acute) exacerbation: Secondary | ICD-10-CM | POA: Insufficient documentation

## 2017-04-21 DIAGNOSIS — Z85118 Personal history of other malignant neoplasm of bronchus and lung: Secondary | ICD-10-CM | POA: Insufficient documentation

## 2017-04-21 DIAGNOSIS — F17210 Nicotine dependence, cigarettes, uncomplicated: Secondary | ICD-10-CM | POA: Insufficient documentation

## 2017-04-21 DIAGNOSIS — I10 Essential (primary) hypertension: Secondary | ICD-10-CM | POA: Diagnosis not present

## 2017-04-21 DIAGNOSIS — Z79899 Other long term (current) drug therapy: Secondary | ICD-10-CM | POA: Insufficient documentation

## 2017-04-21 DIAGNOSIS — Z8673 Personal history of transient ischemic attack (TIA), and cerebral infarction without residual deficits: Secondary | ICD-10-CM | POA: Diagnosis not present

## 2017-04-21 DIAGNOSIS — I2510 Atherosclerotic heart disease of native coronary artery without angina pectoris: Secondary | ICD-10-CM | POA: Diagnosis not present

## 2017-04-21 DIAGNOSIS — Z7901 Long term (current) use of anticoagulants: Secondary | ICD-10-CM | POA: Diagnosis not present

## 2017-04-21 DIAGNOSIS — R0602 Shortness of breath: Secondary | ICD-10-CM | POA: Diagnosis present

## 2017-04-21 HISTORY — DX: Malignant (primary) neoplasm, unspecified: C80.1

## 2017-04-21 LAB — BASIC METABOLIC PANEL
Anion gap: 8 (ref 5–15)
BUN: 9 mg/dL (ref 6–20)
CHLORIDE: 102 mmol/L (ref 101–111)
CO2: 28 mmol/L (ref 22–32)
Calcium: 9.4 mg/dL (ref 8.9–10.3)
Creatinine, Ser: 0.89 mg/dL (ref 0.61–1.24)
GFR calc non Af Amer: >60 mL/min (ref >60)
Glucose, Bld: 107 mg/dL — ABNORMAL HIGH (ref 65–99)
POTASSIUM: 4.1 mmol/L (ref 3.5–5.1)
SODIUM: 138 mmol/L (ref 135–145)

## 2017-04-21 LAB — CBC
HEMATOCRIT: 46.7 % (ref 40.0–52.0)
Hemoglobin: 15.4 g/dL (ref 13.0–18.0)
MCH: 26.9 pg (ref 26.0–34.0)
MCHC: 33 g/dL (ref 32.0–36.0)
MCV: 81.3 fL (ref 80.0–100.0)
Platelets: 338 10*3/uL (ref 150–440)
RBC: 5.74 MIL/uL (ref 4.40–5.90)
RDW: 15.8 % — ABNORMAL HIGH (ref 11.5–14.5)
WBC: 9.1 10*3/uL (ref 3.8–10.6)

## 2017-04-21 LAB — TROPONIN I: Troponin I: <0.03 ng/mL (ref <0.03)

## 2017-04-21 MED ORDER — PREDNISONE 10 MG PO TABS: 40.0000 mg | ORAL_TABLET | Freq: Every day | ORAL | 0 refills | Status: DC

## 2017-04-21 MED ORDER — DILTIAZEM HCL ER COATED BEADS 360 MG PO CP24: 360.0000 mg | ORAL_CAPSULE | Freq: Every day | ORAL | 0 refills | Status: DC

## 2017-04-21 MED ORDER — AZITHROMYCIN 500 MG PO TABS
ORAL_TABLET | ORAL | Status: AC
Start: 2017-04-21 — End: 2017-04-21
  Administered 2017-04-21: 500 mg via ORAL
  Filled 2017-04-21: qty 1

## 2017-04-21 MED ORDER — SIMVASTATIN 20 MG PO TABS: 20.0000 mg | ORAL_TABLET | ORAL | 0 refills | Status: DC

## 2017-04-21 MED ORDER — BUDESONIDE 0.5 MG/2ML IN SUSP: 0.5000 mg | Freq: Two times a day (BID) | RESPIRATORY_TRACT | 0 refills | Status: DC

## 2017-04-21 MED ORDER — AZITHROMYCIN 250 MG PO TABS: ORAL_TABLET | ORAL | 0 refills | Status: DC

## 2017-04-21 MED ORDER — ALBUTEROL SULFATE HFA 108 (90 BASE) MCG/ACT IN AERS: 2.0000 | INHALATION_SPRAY | Freq: Four times a day (QID) | RESPIRATORY_TRACT | 0 refills | Status: DC | PRN

## 2017-04-21 MED ORDER — AZITHROMYCIN 500 MG PO TABS
500.0000 mg | ORAL_TABLET | Freq: Once | ORAL | Status: AC
  Administered 2017-04-21: 500 mg via ORAL

## 2017-04-21 MED ORDER — MIRTAZAPINE 15 MG PO TABS: 15.0000 mg | ORAL_TABLET | Freq: Every day | ORAL | 0 refills | Status: DC

## 2017-04-21 MED ORDER — ADVAIR DISKUS 250-50 MCG/DOSE IN AEPB: 1.0000 | INHALATION_SPRAY | Freq: Two times a day (BID) | RESPIRATORY_TRACT | 0 refills | Status: DC

## 2017-04-21 MED ORDER — DIGOXIN 250 MCG PO TABS: 0.2500 mg | ORAL_TABLET | Freq: Every day | ORAL | 0 refills | Status: DC

## 2017-04-21 MED ORDER — APIXABAN 5 MG PO TABS: 5.0000 mg | ORAL_TABLET | Freq: Two times a day (BID) | ORAL | 0 refills | Status: DC

## 2017-04-21 MED ORDER — METOPROLOL TARTRATE 50 MG PO TABS: 50.0000 mg | ORAL_TABLET | Freq: Two times a day (BID) | ORAL | 0 refills | Status: DC

## 2017-04-21 NOTE — ED Notes (Addendum)
Pt able to ambulate with minimal assist. Pt reports he started to feel weak and SB after a minute of walking. Oxygen saturation did not drop below 94% on RA while ambulating. PT was able to talk 2 minutes into ambulating but had to take breaks in each sentence to catch his breath. Pt also started to have a congested cough after walking for 2 minutes abd developed a exp wheeze that was not noted prior to ambulating.

## 2017-04-21 NOTE — Discharge Instructions (Signed)
You are evaluated for shortness of breath and wheezing as well as chest discomfort, and you are being treated for COPD exacerbation.  As we discussed, I suggested that you stay for a repeat blood draw to evaluate for heart attack, but you chose to leave without doing that test.  There is a risk of missed heart attack, worsening heart and lung condition including death.  Return to the emergency room immediately for any worsening condition including trouble breathing, shortness of breath, chest pain, fever, dizziness or passing out, or any other symptoms concerning to you.  Multiple primary care doctor's offices are provided for you to make a follow-up appointment.

## 2017-04-21 NOTE — ED Notes (Signed)
Patient oxygen saturation dropped to 89% on RA while sleeping. Patient placed on 2L [place]. Oxygen sat at 96%. MD aware.

## 2017-04-21 NOTE — ED Notes (Signed)
Patient spoke with TTS and CSW and expresses understanding of medical options reviewed with him. This RN reviewed patient's prescriptions and discharge instructions. Also instructed patient to return to ED with any worsening or concerning symptoms or any feelings of SI. Patient verbalized understanding and contracted with this RN to return or call 911 for any thoughts of self harm. Patient ambulatory to lobby to wait for ride at this time.

## 2017-04-21 NOTE — Clinical Social Work Note (Signed)
CSW met with pt in room to address social work consult. CSW introduced self and explained Social Work role. Pt was in a hurry stating his ride would be here shortly. Pt lives with a roommate and per RN was diagnosed with cancer July 2018. Pt states his PCP retired and pt has not established a PCP since.   CSW and pt discussed setting up new PCP and Palliative Care Services. Pt is agreeable to CSW making a Palliative referral. Pt also provided resources for [facility] and [facility]. Pt seemed motivated to establish a PCP, as he has not taken his medication in months due to not having a doctor to prescribe meds. RN and EDP aware. TTS also provided pt with Mental Health Resources. CSW made referral for Palliative Services with [facility] Flo Shanks at [phone]. CSW signing off as no further Social Work needs identified.   Oretha Ellis, Latanya Presser, [place] Social Worker-ED [phone]

## 2017-04-21 NOTE — BH Assessment (Signed)
Discussed patient with ER MD (Dr. Reita Cliche) and patient is able to discharge home when medically cleared. Patient was giving referral information and instructions on how to follow up with Outpatient Treatment ([facility] [employer]) and [employer].

## 2017-04-21 NOTE — ED Notes (Addendum)
Patient in hallway requesting phone to use to contact ride. Kennyth Lose, RN assisting patient to use phone. This RN walked with patient back to room to give medication. Patient became tearful and apologized to this RN for his previous behavior. Patient states "Rene Paci just got so much on me right now, I don't know what to do. My cousin that I live with is a pill head and takes drugs 24/7." This RN asked if patient felt safe at home, patient shook his no and stated, "I just can't get the things I need and I don't get any help from him." This RN asked if he was afraid of his roommate and patient stated "no, I don't think he would hurt me". Patient also stated, "I would never hurt myself, I'm not that bad off, I just don't know what I'm going to do." Patient remains tearful and this RN expressed understanding of situation. This RN asked patient if he would like to speak to someone while he was here regarding his needs and patient expressed he would.  Dr. Reita Cliche made aware and consult for social work and TTS placed. Patient agreeing to stay until he spoke with TTS and CSW. Patient remains calm and cooperative at this time.

## 2017-04-21 NOTE — ED Notes (Addendum)
This RN entered patient's room to draw second troponin and answer call bell. Patient asking about whether he would be admitted or not. This RN stated to patient that it would depend on this last blood test but currently there were no results to be admitted for. Patient became visibly frustrated and started removing cardiac monitor leads and blood pressure cuff. Patient also refusing to have any more lab work done at this time. Patient states, "I don't know why I even came up here. Y'all aren't going to do anything for me. Now I have an ambulance bill that I can't pay for. I've been sitting here since 1030 and you haven't done anything for me." This RN explained to patient that we had done blood work and a chest xray and that he had gotten medications from EMS prior to arrival. Patient repeats, "You aren't doing anything for me. This was a waste of time." This RN asked patient what could have been done differently or if there were any tests that he thought he should have had. Patient states, "the doctor came in and said I needed to walk and I did that 2 hours ago so why am I still here?" This RN apologized for patient's length of stay and again asked to repeat blood work. Patient continues to remove gown and climb out of bed. Patient changed into clothes from home. Upon asking patient if this RN could remove IV's for him, patient reached down and pulled his IV from both arms. Catheter intact on both IVs. Bleeding noted from both sites. Patient agreed to have this RN apply gauze to both sight. This RN attempted to call charge nurse for assistance. Charge nurse unavailable. MD notified and requested at bedside. MD able to give test results to patient who remains frustrated, but calmer. This RN explained to patient that he would be discharged with new prescriptions for all of his home medications as well as a prescription for antibiotics and steroids. Patient states he needs a phone to call a ride and is agreeable to  wait for prescriptions and sign for discharge.

## 2017-04-21 NOTE — ED Provider Notes (Addendum)
[facility] Emergency Department Provider Note ____________________________________________   I have reviewed the triage vital signs and the triage nursing note.  HISTORY  Chief Complaint Shortness of Breath   Historian Patient  HPI Ruben [name] is a 62 y.o. male presents from home where he lives with his cousin.  Reports worsening dypsnea and wheezing since Sunday.  This AM took 6 nebulizer tx and was still severely dsypneic.  EMS reports giving duoneb and solumedrol and magnesium.  Does not wear home 02.  Here 02 is 95% RA,  Patient states much worse when trying to walk.  Symptoms this AM were severe, currently mild.  No leg swelling.  Some chest pressure when he was having wheezing, that's gone now.  Ran out of all home meds 1.20month ago due to pcp Dr. EBrynda Greathouseretired, and has not found a new pcp to take medicaid.    Past Medical History:  Diagnosis Date  . Cancer ([place]    lung cancer  . COPD (chronic obstructive pulmonary disease) ([place]   . Coronary artery disease   . Depression   . Hypertension   . Myocardial infarction ([place]   . St[facility] ([facility]     Patient Active Problem List   Diagnosis Date Noted  . Pneumonia 06/28/2015  . COPD with exacerbation ([place] 09/25/2014  . Depression 09/25/2014  . COPD exacerbation ([place] 09/25/2014  . Severe recurrent major depression without psychotic features ([place] 09/25/2014    Past Surgical History:  Procedure Laterality Date  . CARDIAC CATHETERIZATION    . ELBOW SURGERY Left   . FLEXIBLE BRONCHOSCOPY Bilateral 08/27/2016   Procedure: FLEXIBLE BRONCHOSCOPY;  Surgeon: KAllyne Gee MD;  Location: [facility] ORS;  Service: Pulmonary;  Laterality: Bilateral;  . WRIST SURGERY Left     Prior to Admission medications   Medication Sig Start Date End Date Taking? Authorizing Provider  clonazePAM (KLONOPIN) 0.5 MG tablet Take 1 tablet (0.5 mg total) by mouth 3 (three) times daily as needed. For anxiety 09/01/16   Yes PDustin Flock MD  escitalopram (LEXAPRO) 20 MG tablet Take 1 tablet (20 mg total) by mouth every morning. 06/26/16  Yes KGladstone Lighter MD  omeprazole (PRILOSEC) 40 MG capsule Take 40 mg by mouth daily.  06/11/15  Yes [provider]  traZODone (DESYREL) 150 MG tablet Take 150 mg by mouth at bedtime.  06/11/15  Yes [provider]  ADVAIR DISKUS 250-50 MCG/DOSE AEPB Inhale 1 puff into the lungs 2 (two) times daily. 04/21/17   LLisa Roca MD  albuterol (PROVENTIL HFA;VENTOLIN HFA) 108 (90 Base) MCG/ACT inhaler Inhale 2 puffs into the lungs every 6 (six) hours as needed for wheezing or shortness of breath. 04/21/17   LLisa Roca MD  apixaban (ELIQUIS) 5 MG TABS tablet Take 1 tablet (5 mg total) by mouth 2 (two) times daily. 04/21/17   LLisa Roca MD  azithromycin (ZITHROMAX) 250 MG tablet 2566mdaily for 4 more days 04/21/17   LoLisa RocaMD  budesonide (PULMICORT) 0.5 MG/2ML nebulizer solution Take 2 mLs (0.5 mg total) by nebulization 2 (two) times daily. 04/21/17   LoLisa RocaMD  digoxin (LANOXIN) 0.25 MG tablet Take 1 tablet (0.25 mg total) by mouth daily. 04/21/17   LoLisa RocaMD  diltiazem (CARDIZEM CD) 360 MG 24 hr capsule Take 1 capsule (360 mg total) by mouth daily. 04/21/17 04/21/18  LoLisa RocaMD  guaiFENesin (MUCINEX) 600 MG 12 hr tablet Take 1 tablet (600 mg total) by mouth 2 (two) times  daily. 06/26/16   Gladstone Lighter, MD  ipratropium-albuterol (DUONEB) 0.5-2.5 (3) MG/3ML SOLN Take 3 mLs by nebulization 4 (four) times daily as needed. For shortness of breath/wheezing. 06/26/16   Gladstone Lighter, MD  metoprolol tartrate (LOPRESSOR) 50 MG tablet Take 1 tablet (50 mg total) by mouth 2 (two) times daily. 04/21/17 04/21/18  Lisa Roca, MD  mirtazapine (REMERON) 15 MG tablet Take 1 tablet (15 mg total) by mouth at bedtime. 04/21/17   Lisa Roca, MD  nicotine (NICODERM CQ - DOSED IN MG/24 HOURS) 21 mg/24hr patch Place 1 patch (21 mg total) onto the  skin daily. Patient not taking: Reported on 06/23/2016 07/01/15   Fritzi Mandes, MD  predniSONE (DELTASONE) 10 MG tablet Take 4 tablets (40 mg total) by mouth daily. 04/21/17   Lisa Roca, MD  simvastatin (ZOCOR) 20 MG tablet Take 1 tablet (20 mg total) by mouth every morning. 04/21/17   Lisa Roca, MD    No Known Allergies  Family History  Problem Relation Age of Onset  . Heart disease Unknown   . Hypertension Unknown   . Diabetes Unknown   . CAD Mother     Social History Social History   Tobacco Use  . Smoking status: Current Every Day Smoker    Packs/day: 1.00    Years: 43.00    Pack years: 43.00    Types: Cigarettes  . Smokeless tobacco: Never Used  Substance Use Topics  . Alcohol use: No  . Drug use: Yes    Types: Marijuana    Review of Systems  Constitutional: Negative for fever. Eyes: Negative for visual changes. ENT: Negative for sore throat. Cardiovascular: Negative for any ongoing chest pain. Respiratory: Positive as per hpi for shortness of breath. Gastrointestinal: Negative for abdominal pain, vomiting and diarrhea. Genitourinary: Negative for dysuria. Musculoskeletal: Negative for back pain. Skin: Negative for rash. Neurological: Negative for headache.  ____________________________________________   PHYSICAL EXAM:  VITAL SIGNS: ED Triage Vitals  Enc Vitals Group     BP 04/21/17 0945 (!) 142/70     Pulse Rate 04/21/17 0945 73     Resp 04/21/17 0945 16     Temp 04/21/17 0945 (!) 97.4 F (36.3 C)     Temp Source 04/21/17 0945 Oral     SpO2 04/21/17 0945 96 %     Weight 04/21/17 0946 145 lb (65.8 kg)     Height 04/21/17 0946 _0  (1.803 m)     Head Circumference --      Peak Flow --      Pain Score 04/21/17 0944 9     Pain Loc --      Pain Edu? --      Excl. in [place]? --      Constitutional: Alert and oriented. Well appearing and in no distress. HEENT   Head: Normocephalic and atraumatic.      Eyes: Conjunctivae are normal. Pupils  equal and round.       Ears:         Nose: No congestion/rhinnorhea.   Mouth/Throat: Mucous membranes are moist.   Neck: No stridor. Cardiovascular/Chest: Normal rate, regular rhythm.  No murmurs, rubs, or gallops. Respiratory: Normal respiratory effort without tachypnea nor retractions. Mild end expiratory wheezing.  Moderate ronchi with cough.  No rales Gastrointestinal: Soft. No distention, no guarding, no rebound. Nontender.    Genitourinary/rectal:Deferred Musculoskeletal: Nontender with normal range of motion in all extremities. No joint effusions.  No lower extremity tenderness.  No edema. Neurologic:  Normal  speech and language. No gross or focal neurologic deficits are appreciated. Skin:  Skin is warm, dry and intact. No rash noted. Psychiatric: Mood and affect are normal. Speech and behavior are normal. Patient exhibits appropriate insight and judgment.   ____________________________________________  LABS (pertinent positives/negatives) I, Lisa Roca, MD the attending physician have reviewed the labs noted below.  Labs Reviewed  BASIC METABOLIC PANEL - Abnormal; Notable for the following components:      Result Value   Glucose, Bld 107 (*)    All other components within normal limits  CBC - Abnormal; Notable for the following components:   RDW 15.8 (*)    All other components within normal limits  BLOOD GAS, VENOUS - Abnormal; Notable for the following components:   Bicarbonate 30.8 (*)    Acid-Base Excess 3.3 (*)    All other components within normal limits  TROPONIN I    ____________________________________________    EKG I, Lisa Roca, MD, the attending physician have personally viewed and interpreted all ECGs.  61 bpm.  Normal sinus rhythm.  Left axis deviation.  Nonspecific ST and T wave ____________________________________________  RADIOLOGY All Xrays were viewed by me.  Imaging interpreted by Radiologist, and I, Lisa Roca, MD the attending  physician have reviewed the radiologist interpretation noted below.  CXR 2 view:  FINDINGS: There is hyperinflation of the lungs compatible with COPD. Heart and mediastinal contours are within normal limits. No focal opacities or effusions. No acute bony abnormality.  IMPRESSION: COPD.  No active disease. __________________________________________  PROCEDURES  Procedure(s) performed: None  Critical Care performed: None   ____________________________________________  ED COURSE / ASSESSMENT AND PLAN  Pertinent labs & imaging results that were available during my care of the patient were reviewed by me and considered in my medical decision making (see chart for details).   Clinically seems most consistent with copd exacerbation -- patient must have looked much worse with EMs, now looking better, no hypoxia at rest.  EKG okay.  Initial troponin negative.  Patient observed for several hours no return of any significant wheezing.  Patient states he is out of his home medications I did refill except for psychiatric medications consistent with discharge summary from 08/2016.  I was planning to repeat a troponin, however patient states that he is ready to go home and does not want to stay for that.  He understands the risk of missed heart attack and death, and he is alert and oriented and okay for making his own decisions.    DIFFERENTIAL DIAGNOSIS: Differential includes, but is not limited to, viral syndrome, bronchitis including COPD exacerbation, pneumonia, reactive airway disease including asthma, CHF including exacerbation with or without pulmonary/interstitial edema, pneumothorax, ACS, thoracic trauma, and pulmonary embolism.  CONSULTATIONS:   None   Patient / Family / Caregiver informed of clinical course, medical decision-making process, and agree with plan.   I discussed return precautions, follow-up instructions, and discharge instructions with patient and/or  family.  Discharge Instructions : You are evaluated for shortness of breath and wheezing as well as chest discomfort, and you are being treated for COPD exacerbation.  As we discussed, I suggested that you stay for a repeat blood draw to evaluate for heart attack, but you chose to leave without doing that test.  There is a risk of missed heart attack, worsening heart and lung condition including death.  Return to the emergency room immediately for any worsening condition including trouble breathing, shortness of breath, chest pain, fever, dizziness or  passing out, or any other symptoms concerning to you.  Multiple primary care doctor's offices are provided for you to make a follow-up appointment.  Addended to include patient started crying and stating that he does not like his roommate because he is "a pill head.  "He does not endorse active suicidal ideation or hallucinations per he does not voluntary commitment criteria.  I will have Calvin with TTS talk with him as well as consult social work, although I suspect disposition may be the same. Patient care transferred to Dr. Archie Balboa at shift change 3pm.  ___________________________________________   FINAL CLINICAL IMPRESSION(S) / ED DIAGNOSES   Final diagnoses:  COPD exacerbation ([place])  Chest pain, unspecified type      ___________________________________________        Note: This dictation was prepared with Dragon dictation. Any transcriptional errors that result from this process are unintentional    Lisa Roca, MD 04/21/17 1452    Lisa Roca, MD 04/21/17 [phone]

## 2017-04-21 NOTE — ED Notes (Signed)
PT will remain on room air at this time to assess RA saturation. PT does not wear oxygen at home. PT currently at 96%

## 2017-04-21 NOTE — BH Assessment (Signed)
Assessment Note  Ruben [name] is an 62 y.o. male who presents to the ER due SOB. While in the ER patient became tearful and express anxiety about living with his cousin. He states his cousin is a "pill head and it's hard living with someone when you at like that." Patient further reports of have depression due to his recent divorce and medical issues. Throughout the interview patient denied SI/HI and AV/H.  Diagnosis: Unspecified Mood Disorder  Past Medical History:  Past Medical History:  Diagnosis Date  . Cancer ([place])    lung cancer  . COPD (chronic obstructive pulmonary disease) ([place])   . Coronary artery disease   . Depression   . Hypertension   . Myocardial infarction ([place])   . Stroke [facility])     Past Surgical History:  Procedure Laterality Date  . CARDIAC CATHETERIZATION    . ELBOW SURGERY Left   . FLEXIBLE BRONCHOSCOPY Bilateral 08/27/2016   Procedure: FLEXIBLE BRONCHOSCOPY;  Surgeon: Allyne Gee, MD;  Location: [facility] ORS;  Service: Pulmonary;  Laterality: Bilateral;  . WRIST SURGERY Left     Family History:  Family History  Problem Relation Age of Onset  . Heart disease Unknown   . Hypertension Unknown   . Diabetes Unknown   . CAD Mother     Social History:  reports that he has been smoking cigarettes.  He has a 43.00 pack-year smoking history. he has never used smokeless tobacco. He reports that he uses drugs. Drug: Marijuana. He reports that he does not drink alcohol.  Additional Social History:  Alcohol / Drug Use Pain Medications: See PTA Prescriptions: See PTA Over the Counter: See PTA History of alcohol / drug use?: No history of alcohol / drug abuse Longest period of sobriety (when/how long): n/a  CIWA: CIWA-Ar BP: (!) 142/75 Pulse Rate: (!) 55 COWS:    Allergies: No Known Allergies  Home Medications:  (Not in a hospital admission)  OB/GYN Status:  No LMP for male patient.  General Assessment Data Location of Assessment: [facility] ED TTS  Assessment: In system Is this a Tele or Face-to-Face Assessment?: Face-to-Face Is this an Initial Assessment or a Re-assessment for this encounter?: Initial Assessment Marital status: Separated Maiden name: n/a Is patient pregnant?: No Pregnancy Status: No Living Arrangements: Other (Comment)(Lives with cousin) Can pt return to current living arrangement?: Yes Admission Status: Voluntary Is patient capable of signing voluntary admission?: Yes Referral Source: Self/Family/Friend Insurance type: Medicaid  Medical Screening Exam ([place]) Medical Exam completed: Yes  Crisis Care Plan Living Arrangements: Other (Comment)(Lives with cousin) Legal Guardian: Other:(Self) Name of Psychiatrist: Reports of none Name of Therapist: Reports of none  Education Status Is patient currently in school?: No Current Grade: n/a Highest grade of school patient has completed: 11th Grade Name of school: n/a Contact person: n/a  Risk to self with the past 6 months Suicidal Ideation: No Has patient been a risk to self within the past 6 months prior to admission? : No Suicidal Intent: No Has patient had any suicidal intent within the past 6 months prior to admission? : No Is patient at risk for suicide?: No Suicidal Plan?: No Has patient had any suicidal plan within the past 6 months prior to admission? : No Access to Means: No What has been your use of drugs/alcohol within the last 12 months?: Reports of none Previous Attempts/Gestures: No How many times?: 0 Other Self Harm Risks: Reports of none Triggers for Past Attempts: None known Intentional  Self Injurious Behavior: None Family Suicide History: Yes(Cousin and daughter) Recent stressful life event(s): Other (Comment), Recent negative physical changes Persecutory voices/beliefs?: No Depression: Yes Depression Symptoms: Isolating, Tearfulness, Feeling worthless/self pity, Loss of interest in usual pleasures Substance abuse history  and/or treatment for substance abuse?: No Suicide prevention information given to non-admitted patients: Not applicable  Risk to Others within the past 6 months Homicidal Ideation: No Does patient have any lifetime risk of violence toward others beyond the six months prior to admission? : No Thoughts of Harm to Others: No Current Homicidal Intent: No Current Homicidal Plan: No Access to Homicidal Means: No Identified Victim: Reports of none History of harm to others?: No Assessment of Violence: None Noted Violent Behavior Description: Reports of none Does patient have access to weapons?: No Criminal Charges Pending?: No Does patient have a court date: No Is patient on probation?: No  Psychosis Hallucinations: None noted Delusions: None noted  Mental Status Report Appearance/Hygiene: Unremarkable, In scrubs Eye Contact: Good Motor Activity: Unable to assess(Patient sitting on bed) Speech: Logical/coherent, Unremarkable Level of Consciousness: Alert Mood: Depressed, Anxious, Sad, Pleasant Affect: Appropriate to circumstance, Depressed Anxiety Level: Minimal Thought Processes: Coherent, Relevant Judgement: Unimpaired Orientation: Person, Place, Time, Situation, Appropriate for developmental age Obsessive Compulsive Thoughts/Behaviors: None  Cognitive Functioning Concentration: Normal Memory: Recent Intact, Remote Intact IQ: Average Insight: Good Impulse Control: Good Appetite: Fair Weight Loss: 0 Weight Gain: 0 Sleep: No Change Total Hours of Sleep: 4 Vegetative Symptoms: None  ADLScreening [facility] Assessment Services) Patient's cognitive ability adequate to safely complete daily activities?: Yes Patient able to express need for assistance with ADLs?: Yes Independently performs ADLs?: Yes (appropriate for developmental age)  Prior Inpatient Therapy Prior Inpatient Therapy: No Prior Therapy Dates: Reports of none Prior Therapy Facilty/Provider(s): Reports of  none Reason for Treatment: Reports of none  Prior Outpatient Therapy Prior Outpatient Therapy: No Prior Therapy Dates: Reports of none Prior Therapy Facilty/Provider(s): Reports of none Reason for Treatment: Reports of none Does patient have an ACCT team?: No Does patient have Intensive In-House Services?  : No Does patient have [facility] services? : No Does patient have P4CC services?: No  ADL Screening (condition at time of admission) Patient's cognitive ability adequate to safely complete daily activities?: Yes Is the patient deaf or have difficulty hearing?: No Does the patient have difficulty seeing, even when wearing glasses/contacts?: No Does the patient have difficulty concentrating, remembering, or making decisions?: No Patient able to express need for assistance with ADLs?: Yes Does the patient have difficulty dressing or bathing?: No Independently performs ADLs?: Yes (appropriate for developmental age) Does the patient have difficulty walking or climbing stairs?: No Weakness of Legs: None Weakness of Arms/Hands: None     Therapy Consults (therapy consults require a physician order) PT Evaluation Needed: No OT Evalulation Needed: No SLP Evaluation Needed: No Abuse/Neglect Assessment (Assessment to be complete while patient is alone) Abuse/Neglect Assessment Can Be Completed: Yes Physical Abuse: Denies Verbal Abuse: Denies Sexual Abuse: Denies Exploitation of patient/patient's resources: Denies Self-Neglect: Denies Values / Beliefs Cultural Requests During Hospitalization: None Spiritual Requests During Hospitalization: None Consults Spiritual Care Consult Needed: No Social Work Consult Needed: No Regulatory affairs officer (For Healthcare) Does Patient Have a Medical Advance Directive?: No Would patient like information on creating a medical advance directive?: No - Patient declined    Additional Information 1:1 In Past 12 Months?: No CIRT Risk: No Elopement Risk:  No Does patient have medical clearance?: Yes  Child/Adolescent Assessment Running Away Risk: Denies(Patient  is an adult)  Disposition:  Disposition Initial Assessment Completed for this Encounter: Yes Disposition of Patient: Outpatient treatment  On Site Evaluation by:   Reviewed with Physician:    Gunnar Fusi MS, LCAS, LPC, [place], CCSI Therapeutic Triage Specialist 04/21/2017 6:46 PM

## 2017-04-21 NOTE — ED Triage Notes (Signed)
Patient from home via [facility] for increasing SOB since Sunday. Patient reports productive cough with yellow sputum. Denies fever. Patient states he was diagnosed with lung cancer 6 months ago but has not started treatment because "everyone I know has had bad outcomes with treatment". Patient given 2g Magnesium, 3 Duonebs, and 125 mg Solumedrol by EMS. Patient also reports using 6 albuterol treatments at home. Alert and oriented x4.

## 2017-04-22 NOTE — Progress Notes (Signed)
Writer received a call from ED [facility] [place] regarding out  patient Palliative services for Ruben [name]. Information given regarding this service. Patient at this time does not have a PCP, writer explained that an order from a PCP or other non-hospital provider needs to be obtained for this service to be initiaited. Per chart note review patient did have an appointment with out patient pulmonology in June of 2018. Ruben [name] voiced understanding, per Ruben [name], patient is agreeable to out patient Palliative. Will attempt to obtain and order from this group. Patient information faxed to referral. Thank you. Ruben Shanks, RN, BSN, [facility] [place], hospital Liaison [phone]

## 2017-04-23 LAB — BLOOD GAS, VENOUS
Acid-Base Excess: 3.3 mmol/L — ABNORMAL HIGH (ref 0.0–2.0)
BICARBONATE: 30.8 mmol/L — AB (ref 20.0–28.0)
PATIENT TEMPERATURE: 37
PCO2 VEN: 57 mmHg (ref 44.0–60.0)
pH, Ven: 7.34 (ref 7.250–7.430)

## 2017-04-26 ENCOUNTER — Inpatient Hospital Stay: Payer: Medicaid Other

## 2017-04-26 ENCOUNTER — Other Ambulatory Visit: Payer: Self-pay

## 2017-04-26 ENCOUNTER — Encounter: Payer: Self-pay | Admitting: Emergency Medicine

## 2017-04-26 ENCOUNTER — Emergency Department: Payer: Medicaid Other

## 2017-04-26 ENCOUNTER — Inpatient Hospital Stay
Admission: EM | Admit: 2017-04-26 | Discharge: 2017-04-29 | DRG: 189 | Disposition: A | Discharge status: Home / Self Care | Payer: Medicaid Other | Attending: Internal Medicine | Admitting: Internal Medicine

## 2017-04-26 DIAGNOSIS — Z7901 Long term (current) use of anticoagulants: Secondary | ICD-10-CM

## 2017-04-26 DIAGNOSIS — F329 Major depressive disorder, single episode, unspecified: Secondary | ICD-10-CM | POA: Diagnosis present

## 2017-04-26 DIAGNOSIS — I252 Old myocardial infarction: Secondary | ICD-10-CM | POA: Diagnosis not present

## 2017-04-26 DIAGNOSIS — J9621 Acute and chronic respiratory failure with hypoxia: Principal | ICD-10-CM | POA: Diagnosis present

## 2017-04-26 DIAGNOSIS — Z8673 Personal history of transient ischemic attack (TIA), and cerebral infarction without residual deficits: Secondary | ICD-10-CM | POA: Diagnosis not present

## 2017-04-26 DIAGNOSIS — Z66 Do not resuscitate: Secondary | ICD-10-CM | POA: Diagnosis present

## 2017-04-26 DIAGNOSIS — J9600 Acute respiratory failure, unspecified whether with hypoxia or hypercapnia: Secondary | ICD-10-CM | POA: Diagnosis present

## 2017-04-26 DIAGNOSIS — I4891 Unspecified atrial fibrillation: Secondary | ICD-10-CM | POA: Diagnosis present

## 2017-04-26 DIAGNOSIS — R918 Other nonspecific abnormal finding of lung field: Secondary | ICD-10-CM | POA: Diagnosis present

## 2017-04-26 DIAGNOSIS — I10 Essential (primary) hypertension: Secondary | ICD-10-CM | POA: Diagnosis present

## 2017-04-26 DIAGNOSIS — Z8249 Family history of ischemic heart disease and other diseases of the circulatory system: Secondary | ICD-10-CM | POA: Diagnosis not present

## 2017-04-26 DIAGNOSIS — J9601 Acute respiratory failure with hypoxia: Secondary | ICD-10-CM | POA: Diagnosis not present

## 2017-04-26 DIAGNOSIS — I2510 Atherosclerotic heart disease of native coronary artery without angina pectoris: Secondary | ICD-10-CM | POA: Diagnosis present

## 2017-04-26 DIAGNOSIS — E785 Hyperlipidemia, unspecified: Secondary | ICD-10-CM | POA: Diagnosis present

## 2017-04-26 DIAGNOSIS — F17210 Nicotine dependence, cigarettes, uncomplicated: Secondary | ICD-10-CM | POA: Diagnosis present

## 2017-04-26 DIAGNOSIS — Z23 Encounter for immunization: Secondary | ICD-10-CM

## 2017-04-26 DIAGNOSIS — J441 Chronic obstructive pulmonary disease with (acute) exacerbation: Secondary | ICD-10-CM | POA: Diagnosis present

## 2017-04-26 DIAGNOSIS — R0603 Acute respiratory distress: Secondary | ICD-10-CM | POA: Diagnosis present

## 2017-04-26 DIAGNOSIS — R911 Solitary pulmonary nodule: Secondary | ICD-10-CM | POA: Diagnosis not present

## 2017-04-26 LAB — BASIC METABOLIC PANEL
Anion gap: 9 (ref 5–15)
BUN: 17 mg/dL (ref 6–20)
CO2: 28 mmol/L (ref 22–32)
CREATININE: 0.95 mg/dL (ref 0.61–1.24)
Calcium: 9.3 mg/dL (ref 8.9–10.3)
Chloride: 101 mmol/L (ref 101–111)
GFR calc non Af Amer: >60 mL/min (ref >60)
Glucose, Bld: 95 mg/dL (ref 65–99)
POTASSIUM: 4.2 mmol/L (ref 3.5–5.1)
SODIUM: 138 mmol/L (ref 135–145)

## 2017-04-26 LAB — BLOOD GAS, VENOUS
Mode: POSITIVE
PEEP/CPAP: 5 cmH2O
PIP: 10 cmH2O
pCO2, Ven: 55 mmHg (ref 44.0–60.0)
pH, Ven: 7.33 (ref 7.250–7.430)

## 2017-04-26 LAB — CBC WITH DIFFERENTIAL/PLATELET
BASOS PCT: 1 %
Basophils Absolute: 0.1 10*3/uL (ref 0–0.1)
EOS ABS: 0.2 10*3/uL (ref 0–0.7)
EOS PCT: 2 %
HCT: 47 % (ref 40.0–52.0)
Hemoglobin: 15.9 g/dL (ref 13.0–18.0)
LYMPHS ABS: 3.4 10*3/uL (ref 1.0–3.6)
Lymphocytes Relative: 32 %
MCH: 27.7 pg (ref 26.0–34.0)
MCHC: 33.8 g/dL (ref 32.0–36.0)
MCV: 81.7 fL (ref 80.0–100.0)
Monocytes Absolute: 1.2 10*3/uL — ABNORMAL HIGH (ref 0.2–1.0)
Monocytes Relative: 11 %
Neutro Abs: 5.6 10*3/uL (ref 1.4–6.5)
Neutrophils Relative %: 54 %
PLATELETS: 305 10*3/uL (ref 150–440)
RBC: 5.76 MIL/uL (ref 4.40–5.90)
RDW: 15.6 % — ABNORMAL HIGH (ref 11.5–14.5)
WBC: 10.4 10*3/uL (ref 3.8–10.6)

## 2017-04-26 LAB — BRAIN NATRIURETIC PEPTIDE: B NATRIURETIC PEPTIDE 5: 69 pg/mL (ref 0.0–100.0)

## 2017-04-26 LAB — TROPONIN I: Troponin I: <0.03 ng/mL (ref <0.03)

## 2017-04-26 LAB — MRSA PCR SCREENING: MRSA by PCR: NEGATIVE

## 2017-04-26 MED ORDER — ACETAMINOPHEN 325 MG PO TABS
650.0000 mg | ORAL_TABLET | Freq: Four times a day (QID) | ORAL | Status: DC | PRN
  Administered 2017-04-26: 12:00:00 650 mg via ORAL
  Filled 2017-04-26: qty 2

## 2017-04-26 MED ORDER — ACETYLCYSTEINE 20 % IN SOLN
4.0000 mL | Freq: Two times a day (BID) | RESPIRATORY_TRACT | Status: DC
  Administered 2017-04-26 – 2017-04-29 (×6): 4 mL via RESPIRATORY_TRACT
  Filled 2017-04-26 (×6): qty 4

## 2017-04-26 MED ORDER — MOMETASONE FURO-FORMOTEROL FUM 200-5 MCG/ACT IN AERO
2.0000 | INHALATION_SPRAY | Freq: Two times a day (BID) | RESPIRATORY_TRACT | Status: DC
  Administered 2017-04-26: 09:00:00 2 via RESPIRATORY_TRACT
  Filled 2017-04-26: qty 8.8

## 2017-04-26 MED ORDER — METOPROLOL TARTRATE 50 MG PO TABS
50.0000 mg | ORAL_TABLET | Freq: Two times a day (BID) | ORAL | Status: DC
  Administered 2017-04-26 – 2017-04-29 (×5): 50 mg via ORAL
  Filled 2017-04-26 (×9): qty 1

## 2017-04-26 MED ORDER — IOPAMIDOL (ISOVUE-300) INJECTION 61%
75.0000 mL | Freq: Once | INTRAVENOUS | Status: AC | PRN
  Administered 2017-04-26: 75 mL via INTRAVENOUS

## 2017-04-26 MED ORDER — BUDESONIDE 0.5 MG/2ML IN SUSP
0.5000 mg | Freq: Two times a day (BID) | RESPIRATORY_TRACT | Status: DC
  Administered 2017-04-26: 11:00:00 0.5 mg via RESPIRATORY_TRACT
  Filled 2017-04-26: qty 2

## 2017-04-26 MED ORDER — METHYLPREDNISOLONE SODIUM SUCC 125 MG IJ SOLR
60.0000 mg | INTRAMUSCULAR | Status: DC
  Administered 2017-04-26: 60 mg via INTRAVENOUS
  Filled 2017-04-26: qty 2

## 2017-04-26 MED ORDER — PANTOPRAZOLE SODIUM 40 MG PO TBEC
40.0000 mg | DELAYED_RELEASE_TABLET | Freq: Every day | ORAL | Status: DC
  Administered 2017-04-26 – 2017-04-29 (×4): 40 mg via ORAL
  Filled 2017-04-26 (×4): qty 1

## 2017-04-26 MED ORDER — METHYLPREDNISOLONE SODIUM SUCC 125 MG IJ SOLR
125.0000 mg | Freq: Once | INTRAMUSCULAR | Status: AC
  Administered 2017-04-26: 125 mg via INTRAVENOUS
  Filled 2017-04-26: qty 2

## 2017-04-26 MED ORDER — SIMVASTATIN 20 MG PO TABS
20.0000 mg | ORAL_TABLET | ORAL | Status: DC
  Administered 2017-04-26 – 2017-04-29 (×4): 20 mg via ORAL
  Filled 2017-04-26 (×4): qty 1

## 2017-04-26 MED ORDER — DILTIAZEM HCL ER COATED BEADS 180 MG PO CP24
360.0000 mg | ORAL_CAPSULE | Freq: Every day | ORAL | Status: DC
  Administered 2017-04-26 – 2017-04-29 (×4): 360 mg via ORAL
  Filled 2017-04-26 (×2): qty 2
  Filled 2017-04-26: qty 1
  Filled 2017-04-26: qty 2

## 2017-04-26 MED ORDER — IPRATROPIUM-ALBUTEROL 0.5-2.5 (3) MG/3ML IN SOLN
3.0000 mL | RESPIRATORY_TRACT | Status: DC | PRN
  Administered 2017-04-26: 3 mL via RESPIRATORY_TRACT
  Filled 2017-04-26: qty 3

## 2017-04-26 MED ORDER — METHYLPREDNISOLONE SODIUM SUCC 125 MG IJ SOLR
60.0000 mg | Freq: Four times a day (QID) | INTRAMUSCULAR | Status: DC
  Administered 2017-04-26 – 2017-04-28 (×8): 60 mg via INTRAVENOUS
  Filled 2017-04-26 (×8): qty 2

## 2017-04-26 MED ORDER — DEXTROSE 5 % IV SOLN
500.0000 mg | INTRAVENOUS | Status: DC
  Administered 2017-04-26 – 2017-04-28 (×3): 500 mg via INTRAVENOUS
  Filled 2017-04-26 (×3): qty 500

## 2017-04-26 MED ORDER — ONDANSETRON HCL 4 MG PO TABS: 4.0000 mg | ORAL_TABLET | Freq: Four times a day (QID) | ORAL | Status: DC | PRN

## 2017-04-26 MED ORDER — INFLUENZA VAC SPLIT QUAD 0.5 ML IM SUSY
0.5000 mL | PREFILLED_SYRINGE | INTRAMUSCULAR | Status: AC
  Administered 2017-04-27: 0.5 mL via INTRAMUSCULAR
  Filled 2017-04-26: qty 0.5

## 2017-04-26 MED ORDER — ONDANSETRON HCL 4 MG/2ML IJ SOLN: 4.0000 mg | Freq: Four times a day (QID) | INTRAMUSCULAR | Status: DC | PRN

## 2017-04-26 MED ORDER — DOCUSATE SODIUM 100 MG PO CAPS
100.0000 mg | ORAL_CAPSULE | Freq: Two times a day (BID) | ORAL | Status: DC
  Administered 2017-04-26 – 2017-04-29 (×7): 100 mg via ORAL
  Filled 2017-04-26 (×7): qty 1

## 2017-04-26 MED ORDER — GUAIFENESIN ER 600 MG PO TB12
600.0000 mg | ORAL_TABLET | Freq: Two times a day (BID) | ORAL | Status: DC
  Administered 2017-04-26 – 2017-04-29 (×7): 600 mg via ORAL
  Filled 2017-04-26 (×7): qty 1

## 2017-04-26 MED ORDER — BUDESONIDE 0.25 MG/2ML IN SUSP
0.2500 mg | Freq: Two times a day (BID) | RESPIRATORY_TRACT | Status: DC
  Administered 2017-04-26 – 2017-04-29 (×6): 0.25 mg via RESPIRATORY_TRACT
  Filled 2017-04-26 (×6): qty 2

## 2017-04-26 MED ORDER — ENOXAPARIN SODIUM 40 MG/0.4ML ~~LOC~~ SOLN: 40.0000 mg | SUBCUTANEOUS | Status: DC

## 2017-04-26 MED ORDER — SODIUM CHLORIDE 0.9 % IV SOLN
INTRAVENOUS | Status: DC
  Administered 2017-04-26: 08:00:00 via INTRAVENOUS

## 2017-04-26 MED ORDER — TRAZODONE HCL 50 MG PO TABS
150.0000 mg | ORAL_TABLET | Freq: Every day | ORAL | Status: DC
  Administered 2017-04-26 – 2017-04-28 (×3): 150 mg via ORAL
  Filled 2017-04-26 (×3): qty 3

## 2017-04-26 MED ORDER — MIRTAZAPINE 15 MG PO TABS
15.0000 mg | ORAL_TABLET | Freq: Every day | ORAL | Status: DC
  Administered 2017-04-26 – 2017-04-28 (×3): 15 mg via ORAL
  Filled 2017-04-26 (×3): qty 1

## 2017-04-26 MED ORDER — DIGOXIN 250 MCG PO TABS
0.2500 mg | ORAL_TABLET | Freq: Every day | ORAL | Status: DC
  Administered 2017-04-26: 0.25 mg via ORAL
  Filled 2017-04-26: qty 1

## 2017-04-26 MED ORDER — ESCITALOPRAM OXALATE 10 MG PO TABS
20.0000 mg | ORAL_TABLET | ORAL | Status: DC
  Administered 2017-04-26 – 2017-04-29 (×4): 20 mg via ORAL
  Filled 2017-04-26: qty 2
  Filled 2017-04-26: qty 1
  Filled 2017-04-26 (×2): qty 2

## 2017-04-26 MED ORDER — APIXABAN 5 MG PO TABS
5.0000 mg | ORAL_TABLET | Freq: Two times a day (BID) | ORAL | Status: DC
  Administered 2017-04-26 – 2017-04-29 (×7): 5 mg via ORAL
  Filled 2017-04-26 (×7): qty 1

## 2017-04-26 MED ORDER — IPRATROPIUM-ALBUTEROL 0.5-2.5 (3) MG/3ML IN SOLN
3.0000 mL | Freq: Four times a day (QID) | RESPIRATORY_TRACT | Status: DC
  Administered 2017-04-26 – 2017-04-29 (×10): 3 mL via RESPIRATORY_TRACT
  Filled 2017-04-26 (×12): qty 3

## 2017-04-26 MED ORDER — ACETAMINOPHEN 650 MG RE SUPP: 650.0000 mg | Freq: Four times a day (QID) | RECTAL | Status: DC | PRN

## 2017-04-26 MED ORDER — CLONAZEPAM 0.5 MG PO TABS
0.5000 mg | ORAL_TABLET | Freq: Three times a day (TID) | ORAL | Status: DC | PRN
  Administered 2017-04-26 – 2017-04-29 (×4): 0.5 mg via ORAL
  Filled 2017-04-26 (×4): qty 1

## 2017-04-26 NOTE — H&P (Signed)
Ruben [name] is an 62 y.o. male.   Chief Complaint: Respiratory distress HPI: The patient with COPD, lung cancer, stroke, CAD status post MI and and hypertension presents to the emergency department with shortness of breath.  The patient states that he has had difficulty breathing for the last 2 days and has not been able to catch his breath when lying down.  His cough is nonproductive but he denies fever, nausea or vomiting.  He was found to have increased work of breathing which prompted initiation of BiPAP.  Patient was given Solu-Medrol in the emergency department prior to the the hospitalist staff being called for admission.  Past Medical History:  Diagnosis Date  . Cancer ([place])    lung cancer  . COPD (chronic obstructive pulmonary disease) ([place])   . Coronary artery disease   . Depression   . Hypertension   . Myocardial infarction ([place])   . Stroke [facility])     Past Surgical History:  Procedure Laterality Date  . CARDIAC CATHETERIZATION    . ELBOW SURGERY Left   . FLEXIBLE BRONCHOSCOPY Bilateral 08/27/2016   Procedure: FLEXIBLE BRONCHOSCOPY;  Surgeon: Allyne Gee, MD;  Location: [facility] ORS;  Service: Pulmonary;  Laterality: Bilateral;  . WRIST SURGERY Left     Family History  Problem Relation Age of Onset  . Heart disease Unknown   . Hypertension Unknown   . Diabetes Unknown   . CAD Mother    Social History:  reports that he has quit smoking. His smoking use included cigarettes. He has a 43.00 pack-year smoking history. he has never used smokeless tobacco. He reports that he uses drugs. Drug: Marijuana. He reports that he does not drink alcohol.  Allergies: No Known Allergies   (Not in a hospital admission)  Results for orders placed or performed during the hospital encounter of 04/26/17 (from the past 48 hour(s))  CBC with Differential     Status: Abnormal   Collection Time: 04/26/17  2:08 AM  Result Value Ref Range   WBC 10.4 3.8 - 10.6 K/uL   RBC 5.76 4.40 - 5.90  MIL/uL   Hemoglobin 15.9 13.0 - 18.0 g/dL   HCT 47.0 40.0 - 52.0 %   MCV 81.7 80.0 - 100.0 fL   MCH 27.7 26.0 - 34.0 pg   MCHC 33.8 32.0 - 36.0 g/dL   RDW 15.6 (H) 11.5 - 14.5 %   Platelets 305 150 - 440 K/uL   Neutrophils Relative % 54 %   Neutro Abs 5.6 1.4 - 6.5 K/uL   Lymphocytes Relative 32 %   Lymphs Abs 3.4 1.0 - 3.6 K/uL   Monocytes Relative 11 %   Monocytes Absolute 1.2 (H) 0.2 - 1.0 K/uL   Eosinophils Relative 2 %   Eosinophils Absolute 0.2 0 - 0.7 K/uL   Basophils Relative 1 %   Basophils Absolute 0.1 0 - 0.1 K/uL    Comment: Performed at [facility], [address] [place], [place] [address]  Basic metabolic panel     Status: None   Collection Time: 04/26/17  2:08 AM  Result Value Ref Range   Sodium 138 135 - 145 mmol/L   Potassium 4.2 3.5 - 5.1 mmol/L   Chloride 101 101 - 111 mmol/L   CO2 28 22 - 32 mmol/L   Glucose, Bld 95 65 - 99 mg/dL   BUN 17 6 - 20 mg/dL   Creatinine, Ser 0.95 0.61 - 1.24 mg/dL   Calcium 9.3 8.9 -  10.3 mg/dL   GFR calc non Af Amer >60 >60 mL/min   GFR calc Af Amer >60 >60 mL/min    Comment: (NOTE) The eGFR has been calculated using the CKD EPI equation. This calculation has not been validated in all clinical situations. eGFR's persistently <60 mL/min signify possible Chronic Kidney Disease.    Anion gap 9 5 - 15    Comment: Performed at [facility], [address] [place], [place] [address]  Brain natriuretic peptide     Status: None   Collection Time: 04/26/17  2:08 AM  Result Value Ref Range   B Natriuretic Peptide 69.0 0.0 - 100.0 pg/mL    Comment: Performed at [facility], [address] [place], [place] [address]  Troponin I     Status: None   Collection Time: 04/26/17  2:08 AM  Result Value Ref Range   Troponin I <0.03 <0.03 ng/mL    Comment: Performed at [facility], [address] [place], [place] [address]   Dg Chest Portable 1 View  Result Date: 04/26/2017 CLINICAL DATA:   Respiratory distress, shortness of breath. History of lung cancer. EXAM: PORTABLE CHEST 1 VIEW COMPARISON:  Chest radiograph April 21, 2017 FINDINGS: Mild chronic interstitial changes increased lung volumes. Similar LEFT costophrenic angle scarring. Improved aeration of lung bases. Cardiomediastinal silhouette is normal, mildly calcified aortic op arch. No pleural effusion or focal consolidation. No pneumothorax. Soft tissue planes included osseous structures are non suspicious. Old LEFT mid clavicle fracture. IMPRESSION: COPD, no acute cardiopulmonary process. Aortic Atherosclerosis (ICD10-I70.0) and Emphysema (ICD10-J43.9). Electronically Signed   By: Elon Alas M.D.   On: 04/26/2017 02:45    Review of Systems  Constitutional: Negative for chills and fever.  HENT: Negative for sore throat and tinnitus.   Eyes: Negative for blurred vision and redness.  Respiratory: Positive for cough and shortness of breath. Negative for sputum production.   Cardiovascular: Negative for chest pain, palpitations, orthopnea and PND.  Gastrointestinal: Negative for abdominal pain, diarrhea, nausea and vomiting.  Genitourinary: Negative for dysuria, frequency and urgency.  Musculoskeletal: Negative for joint pain and myalgias.  Skin: Negative for rash.       No lesions  Neurological: Negative for speech change, focal weakness and weakness.  Endo/Heme/Allergies: Does not bruise/bleed easily.       No temperature intolerance  Psychiatric/Behavioral: Negative for depression and suicidal ideas.    Blood pressure 117/69, pulse 95, temperature (!) 97 F (36.1 C), temperature source Axillary, resp. rate 19, height 5' 11" (1.803 m), weight 65.8 kg (145 lb), SpO2 99 %. Physical Exam  Nursing note and vitals reviewed. Constitutional: He is oriented to person, place, and time. He appears well-developed and well-nourished. No distress.  HENT:  Head: Normocephalic and atraumatic.  Mouth/Throat: Oropharynx is  clear and moist.  Eyes: Conjunctivae and EOM are normal. Pupils are equal, round, and reactive to light. No scleral icterus.  Neck: Normal range of motion. Neck supple. No JVD present. No tracheal deviation present. No thyromegaly present.  Cardiovascular: Normal rate, regular rhythm and normal heart sounds. Exam reveals no gallop and no friction rub.  No murmur heard. Respiratory: Breath sounds normal. He is in respiratory distress.  On BiPAP  GI: Soft. Bowel sounds are normal. He exhibits no distension. There is no tenderness.  Genitourinary:  Genitourinary Comments: Deferred  Musculoskeletal: Normal range of motion. He exhibits no edema.  Lymphadenopathy:    He has no cervical adenopathy.  Neurological: He is alert and  oriented to person, place, and time. No cranial nerve deficit.  Skin: Skin is warm and dry. No rash noted. No erythema.  Psychiatric: He has a normal mood and affect. His behavior is normal. Judgment and thought content normal.     Assessment/Plan This is a 62 year old male admitted for respiratory failure. 1.  Respiratory failure: Acute; with hypoxia.  Continue BiPAP for oxygenation as well as work of breathing.  The patient likely weaned quickly.  Steroid pulse and azithromycin for anti-inflammatory effect. 2.  COPD: With exacerbation; continue inhaled corticosteroid as well as anticholinergic.  Albuterol scheduled. 3.  Hypertension: Controlled; continue metoprolol 4.  Tobacco abuse: Continues to smoke; counseled on smoking cessation. 5.  Atrial fibrillation: Rate controlled; continue digoxin and diltiazem.  Continue Eliquis for anticoagulation 6.  Hyperlipidemia: Continue statin therapy 7.  Depression: Continue Lexapro and Remeron 8.  DVT prophylaxis: As above 9.  GI prophylaxis: None The patient is a full code.  Time spent on admission orders and critical care approximately 45 minutes.  Discussed with [facility] telemedicine.   Harrie Foreman, MD 04/26/2017, 4:00  AM

## 2017-04-26 NOTE — ED Notes (Signed)
Pt requesting sandwich tray, provided by this RN, but pt reports "I just feel tired"

## 2017-04-26 NOTE — Progress Notes (Signed)
Gone to radiology to have a CT of the chest without contrast

## 2017-04-26 NOTE — Progress Notes (Signed)
[place] at [facility]                                                                                                                                                                                  Patient Demographics   Oluwatimileyin Vivier, is a 62 y.o. male, DOB - [birth year], OZD:[id number]  Admit date - 04/26/2017   Admitting Physician Harrie Foreman, MD  Outpatient Primary MD for the patient is Patient, No Pcp Per   LOS - 0  Subjective: Patient known to me improved from previous admission at that time he had a CT scan concerning for lung cancer he never followed up Returns back with shortness of breath cough congestion    Review of Systems:   CONSTITUTIONAL: No documented fever. No fatigue, weakness. No weight gain, no weight loss.  EYES: No blurry or double vision.  ENT: No tinnitus. No postnasal drip. No redness of the oropharynx.  RESPIRATORY:   As needed cough, no wheeze, no hemoptysis.  Positive dyspnea.  CARDIOVASCULAR: No chest pain. No orthopnea. No palpitations. No syncope.  GASTROINTESTINAL: No nausea, no vomiting or diarrhea. No abdominal pain. No melena or hematochezia.  GENITOURINARY: No dysuria or hematuria.  ENDOCRINE: No polyuria or nocturia. No heat or cold intolerance.  HEMATOLOGY: No anemia. No bruising. No bleeding.  INTEGUMENTARY: No rashes. No lesions.  MUSCULOSKELETAL: No arthritis. No swelling. No gout.  NEUROLOGIC: No numbness, tingling, or ataxia. No seizure-type activity.  PSYCHIATRIC: No anxiety. No insomnia. No ADD.    Vitals:   Vitals:   04/26/17 0630 04/26/17 0700 04/26/17 0800 04/26/17 1048  BP:   (!) 158/77   Pulse: 71 66 82   Resp:      Temp:   97.7 F (36.5 C)   TempSrc:   Oral   SpO2: 97% 98% 100% 98%  Weight:      Height:        Wt Readings from Last 3 Encounters:  04/26/17 145 lb (65.8 kg)  04/21/17 145 lb (65.8 kg)  08/25/16 144 lb 13.5 oz (65.7 kg)     Intake/Output Summary (Last 24 hours)  at 04/26/2017 1401 Last data filed at 04/26/2017 0900 Gross per 24 hour  Intake 240 ml  Output -  Net 240 ml    Physical Exam:   GENERAL: Pleasant-appearing in no apparent distress.  HEAD, EYES, EARS, NOSE AND THROAT: Atraumatic, normocephalic. Extraocular muscles are intact. Pupils equal and reactive to light. Sclerae anicteric. No conjunctival injection. No oro-pharyngeal erythema.  NECK: Supple. There is no jugular venous distention. No bruits, no lymphadenopathy, no thyromegaly.  HEART: Regular rate and rhythm,. No murmurs, no rubs, no clicks.  LUNGS: As needed wheezing bilaterally without any accessory muscle usage ABDOMEN: Soft, flat, nontender, nondistended. Has good bowel sounds. No hepatosplenomegaly appreciated.  EXTREMITIES: No evidence of any cyanosis, clubbing, or peripheral edema.  +2 pedal and radial pulses bilaterally.  NEUROLOGIC: The patient is alert, awake, and oriented x3 with no focal motor or sensory deficits appreciated bilaterally.  SKIN: Moist and warm with no rashes appreciated.  Psych: Not anxious, depressed LN: No inguinal LN enlargement    Antibiotics   Anti-infectives (From admission, onward)   Start     Dose/Rate Route Frequency Ordered Stop   04/26/17 0800  azithromycin (ZITHROMAX) 500 mg in dextrose 5 % 250 mL IVPB     500 mg 250 mL/hr over 60 Minutes Intravenous Every 24 hours 04/26/17 0758        Medications   Scheduled Meds: . apixaban  5 mg Oral BID  . budesonide  0.5 mg Nebulization BID  . digoxin  0.25 mg Oral Daily  . diltiazem  360 mg Oral Daily  . docusate sodium  100 mg Oral BID  . escitalopram  20 mg Oral BH-q7a  . guaiFENesin  600 mg Oral BID  . [START ON 04/27/2017] Influenza vac split quadrivalent PF  0.5 mL Intramuscular Tomorrow-1000  . methylPREDNISolone (SOLU-MEDROL) injection  60 mg Intravenous Q24H  . metoprolol tartrate  50 mg Oral BID  . mirtazapine  15 mg Oral QHS  . mometasone-formoterol  2 puff Inhalation BID  .  pantoprazole  40 mg Oral Daily  . simvastatin  20 mg Oral BH-q7a  . traZODone  150 mg Oral QHS   Continuous Infusions: . sodium chloride 100 mL/hr at 04/26/17 0826  . azithromycin Stopped (04/26/17 1007)   PRN Meds:.acetaminophen **OR** acetaminophen, clonazePAM, ipratropium-albuterol, ondansetron **OR** ondansetron (ZOFRAN) IV   Data Review:   Micro Results No results found for this or any previous visit (from the past 240 hour(s)).  Radiology Reports Dg Chest 2 View  Result Date: 04/21/2017 CLINICAL DATA:  Shortness of breath, cough EXAM: CHEST  2 VIEW COMPARISON:  08/30/2016 FINDINGS: There is hyperinflation of the lungs compatible with COPD. Heart and mediastinal contours are within normal limits. No focal opacities or effusions. No acute bony abnormality. IMPRESSION: COPD.  No active disease. Electronically Signed   By: Rolm Baptise M.D.   On: 04/21/2017 10:11   Dg Chest Portable 1 View  Result Date: 04/26/2017 CLINICAL DATA:  Respiratory distress, shortness of breath. History of lung cancer. EXAM: PORTABLE CHEST 1 VIEW COMPARISON:  Chest radiograph April 21, 2017 FINDINGS: Mild chronic interstitial changes increased lung volumes. Similar LEFT costophrenic angle scarring. Improved aeration of lung bases. Cardiomediastinal silhouette is normal, mildly calcified aortic op arch. No pleural effusion or focal consolidation. No pneumothorax. Soft tissue planes included osseous structures are non suspicious. Old LEFT mid clavicle fracture. IMPRESSION: COPD, no acute cardiopulmonary process. Aortic Atherosclerosis (ICD10-I70.0) and Emphysema (ICD10-J43.9). Electronically Signed   By: Elon Alas M.D.   On: 04/26/2017 02:45     CBC Recent Labs  Lab 04/21/17 0949 04/26/17 0208  WBC 9.1 10.4  HGB 15.4 15.9  HCT 46.7 47.0  PLT 338 305  MCV 81.3 81.7  MCH 26.9 27.7  MCHC 33.0 33.8  RDW 15.8* 15.6*  LYMPHSABS  --  3.4  MONOABS  --  1.2*  EOSABS  --  0.2  BASOSABS  --  0.1     Chemistries  Recent Labs  Lab 04/21/17 0949 04/26/17 0208  NA 138 138  K 4.1 4.2  CL 102 101  CO2 28 28  GLUCOSE 107* 95  BUN 9 17  CREATININE 0.89 0.95  CALCIUM 9.4 9.3   ------------------------------------------------------------------------------------------------------------------ estimated creatinine clearance is 76 mL/min (by C-G formula based on SCr of 0.95 mg/dL). ------------------------------------------------------------------------------------------------------------------ No results for input(s): HGBA1C in the last 72 hours. ------------------------------------------------------------------------------------------------------------------ No results for input(s): CHOL, HDL, LDLCALC, TRIG, CHOLHDL, LDLDIRECT in the last 72 hours. ------------------------------------------------------------------------------------------------------------------ No results for input(s): TSH, T4TOTAL, T3FREE, THYROIDAB in the last 72 hours.  Invalid input(s): FREET3 ------------------------------------------------------------------------------------------------------------------ No results for input(s): VITAMINB12, FOLATE, FERRITIN, TIBC, IRON, RETICCTPCT in the last 72 hours.  Coagulation profile No results for input(s): INR, PROTIME in the last 168 hours.  No results for input(s): DDIMER in the last 72 hours.  Cardiac Enzymes Recent Labs  Lab 04/21/17 0949 04/26/17 0208  TROPONINI <0.03 <0.03   ------------------------------------------------------------------------------------------------------------------ Invalid input(s): POCBNP    Assessment & Plan   This is a 62 year old male admitted for respiratory failure. 1.    Acute on chronic respiratory failure with hypoxia: change nebs to every 6 hours, change Solu-Medrol to every 6 hours, previous CT with concerning for bronchogenic carcinoma I will obtain a CT scan of the chest 2.    Acute on chronic COPD: With  exacerbation; continue inhaled corticosteroid as well as anticholinergic.  Albuterol scheduled.,   3.  Hypertension: Controlled; continue metoprolol 4.  Tobacco abuse: Continues to smoke; counseled on smoking cessation. 4 minutes spent, recommended to stop smoking nicotine patch offered 5.  Atrial fibrillation: Discontinue digoxin due to slow heart rate continue metoprolol, Eliquis  6.  Hyperlipidemia: Continue statin therapy 7.  Depression: Continue Lexapro and Remeron 8.  DVT prophylaxis: As above 9.  GI prophylaxis: None          Code Status Orders  (From admission, onward)        Start     Ordered   04/26/17 0759  Full code  Continuous     04/26/17 0758    Code Status History    Date Active Date Inactive Code Status Order ID Comments User Context   08/25/2016 06:26 09/01/2016 15:40 Full Code [id number]  Saundra Shelling, MD Inpatient   06/23/2016 18:56 06/26/2016 15:14 Full Code [id number]  Gladstone Lighter, MD Inpatient   06/28/2015 13:09 07/01/2015 19:36 Full Code [id number]  Bettey Costa, MD Inpatient   06/28/2015 11:09 06/28/2015 11:15 DNR [id number]  Bettey Costa, MD ED   09/25/2014 04:34 09/26/2014 14:41 DNR [id number]  Lance Coon, MD Inpatient           Consults  48min   DVT Prophylaxis  eliquis  Lab Results  Component Value Date   PLT 305 04/26/2017     Time Spent in minutes   42min  Greater than 50% of time spent in care coordination and counseling patient regarding the condition and plan of care.   Dustin Flock M.D on 04/26/2017 at 2:01 PM  Between 7am to 6pm - Pager - [phone]  After 6pm go to [email or web address] - password [email or web address] [facility]   Office  [phone]

## 2017-04-26 NOTE — ED Triage Notes (Signed)
Pt bib [facility] from home d/t respiratory distress. Pt was seen and d/c last Wednesday for [facility] with nebulizers. Pt reports using 10 nebulizers at home today without relief. Pt placed on bipap upon arrival.

## 2017-04-26 NOTE — ED Notes (Signed)
Pt increased to 4L [place].  

## 2017-04-26 NOTE — Progress Notes (Signed)
Back from test

## 2017-04-26 NOTE — ED Notes (Signed)
This RN attempt to call report at 17 min assigned. CCU states bed not approved yet, pt will be going to a room waiting to be cleaned. ED Charge aware.

## 2017-04-26 NOTE — ED Notes (Signed)
Pt green tshirt and wallet in belongings bag hanging on IV pole on ED stretcher

## 2017-04-26 NOTE — ED Notes (Signed)
Admitting MD at bedside.

## 2017-04-26 NOTE — Progress Notes (Signed)
[facility]  Shown to dr.brown @025  04/26/2017

## 2017-04-26 NOTE — ED Notes (Signed)
Patient is resting comfortably at this time with no signs of distress present. VS stable. Will continue to monitor.

## 2017-04-26 NOTE — ED Provider Notes (Signed)
[facility] Emergency Department Provider Note    First MD Initiated Contact with Patient 04/26/17 0150     (approximate)  I have reviewed the triage vital signs and the nursing notes.  Level 5 caveat: History limited secondary to respiratory distress HISTORY  Chief Complaint Respiratory Distress    HPI Ruben [name] is a 62 y.o. male with below list of chronic medical conditions including lung cancer and COPD presents to the emergency department via EMS with progressive dyspnea over the past week.  Patient states that he has used 10 nebulized breathing treatments today at home without any relief.  On EMS arrival patient's oxygen saturation was in the 60s-70s.  Following multiple duo nebs in route via EMS oxygen saturation improved currently on DuoNeb 98% however patient remains tachypneic with a respiratory rate of30   Past Medical History:  Diagnosis Date  . Cancer ([place])    lung cancer  . COPD (chronic obstructive pulmonary disease) ([place])   . Coronary artery disease   . Depression   . Hypertension   . Myocardial infarction ([place])   . Stroke [facility])     Patient Active Problem List   Diagnosis Date Noted  . Acute respiratory failure ([place]) 04/26/2017  . Pneumonia 06/28/2015  . COPD with exacerbation ([place]) 09/25/2014  . Depression 09/25/2014  . COPD exacerbation ([place]) 09/25/2014  . Severe recurrent major depression without psychotic features ([place]) 09/25/2014    Past Surgical History:  Procedure Laterality Date  . CARDIAC CATHETERIZATION    . ELBOW SURGERY Left   . FLEXIBLE BRONCHOSCOPY Bilateral 08/27/2016   Procedure: FLEXIBLE BRONCHOSCOPY;  Surgeon: Allyne Gee, MD;  Location: [facility] ORS;  Service: Pulmonary;  Laterality: Bilateral;  . WRIST SURGERY Left     Prior to Admission medications   Medication Sig Start Date End Date Taking? Authorizing Provider  ADVAIR DISKUS 250-50 MCG/DOSE AEPB Inhale 1 puff into the lungs 2 (two) times daily.  04/21/17  Yes Lisa Roca, MD  albuterol (PROVENTIL HFA;VENTOLIN HFA) 108 (90 Base) MCG/ACT inhaler Inhale 2 puffs into the lungs every 6 (six) hours as needed for wheezing or shortness of breath. 04/21/17  Yes Lisa Roca, MD  apixaban (ELIQUIS) 5 MG TABS tablet Take 1 tablet (5 mg total) by mouth 2 (two) times daily. 04/21/17  Yes Lisa Roca, MD  budesonide (PULMICORT) 0.5 MG/2ML nebulizer solution Take 2 mLs (0.5 mg total) by nebulization 2 (two) times daily. 04/21/17  Yes Lisa Roca, MD  clonazePAM (KLONOPIN) 0.5 MG tablet Take 1 tablet (0.5 mg total) by mouth 3 (three) times daily as needed. For anxiety 09/01/16  Yes Dustin Flock, MD  digoxin (LANOXIN) 0.25 MG tablet Take 1 tablet (0.25 mg total) by mouth daily. 04/21/17  Yes Lisa Roca, MD  diltiazem (CARDIZEM CD) 360 MG 24 hr capsule Take 1 capsule (360 mg total) by mouth daily. 04/21/17 04/21/18 Yes Lisa Roca, MD  escitalopram (LEXAPRO) 20 MG tablet Take 1 tablet (20 mg total) by mouth every morning. 06/26/16  Yes Gladstone Lighter, MD  guaiFENesin (MUCINEX) 600 MG 12 hr tablet Take 1 tablet (600 mg total) by mouth 2 (two) times daily. 06/26/16  Yes Gladstone Lighter, MD  ipratropium-albuterol (DUONEB) 0.5-2.5 (3) MG/3ML SOLN Take 3 mLs by nebulization 4 (four) times daily as needed. For shortness of breath/wheezing. 06/26/16  Yes Gladstone Lighter, MD  metoprolol tartrate (LOPRESSOR) 50 MG tablet Take 1 tablet (50 mg total) by mouth 2 (two) times daily. 04/21/17 04/21/18 Yes Lisa Roca, MD  mirtazapine (REMERON) 15 MG tablet Take 1 tablet (15 mg total) by mouth at bedtime. 04/21/17  Yes Lisa Roca, MD  omeprazole (PRILOSEC) 40 MG capsule Take 40 mg by mouth daily.  06/11/15  Yes [provider]  simvastatin (ZOCOR) 20 MG tablet Take 1 tablet (20 mg total) by mouth every morning. 04/21/17  Yes Lisa Roca, MD  traZODone (DESYREL) 150 MG tablet Take 150 mg by mouth at bedtime.  06/11/15  Yes [provider]     Allergies No known drug allergies  Family History  Problem Relation Age of Onset  . Heart disease Unknown   . Hypertension Unknown   . Diabetes Unknown   . CAD Mother     Social History Social History   Tobacco Use  . Smoking status: Former Smoker    Packs/day: 1.00    Years: 43.00    Pack years: 43.00    Types: Cigarettes  . Smokeless tobacco: Never Used  Substance Use Topics  . Alcohol use: No  . Drug use: Yes    Types: Marijuana    Review of Systems Constitutional: No fever/chills Eyes: No visual changes. ENT: No sore throat. Cardiovascular: Positive for chest pain. Respiratory: Positive for dyspnea and cough Gastrointestinal: No abdominal pain.  No nausea, no vomiting.  No diarrhea.  No constipation. Genitourinary: Negative for dysuria. Musculoskeletal: Negative for neck pain.  Negative for back pain. Integumentary: Negative for rash. Neurological: Negative for headaches, focal weakness or numbness.   ____________________________________________   PHYSICAL EXAM:  VITAL SIGNS: ED Triage Vitals  Enc Vitals Group     BP 04/26/17 0215 102/72     Pulse Rate 04/26/17 0215 95     Resp 04/26/17 0215 (!) 27     Temp 04/26/17 0215 (!) 97 F (36.1 C)     Temp Source 04/26/17 0215 Axillary     SpO2 04/26/17 0215 98 %     Weight 04/26/17 0217 65.8 kg (145 lb)     Height 04/26/17 0217 1.803 m (_0 )     Head Circumference --      Peak Flow --      Pain Score 04/26/17 0214 2     Pain Loc --      Pain Edu? --      Excl. in [place]? --     Constitutional: Alert and oriented. Apparent Respiratory Distress Eyes: Conjunctivae are normal. PERRL. EOMI. Head: Atraumatic. Mouth/Throat: Mucous membranes are moist.  Oropharynx non-erythematous. Neck: No stridor.   Cardiovascular: Normal rate, regular rhythm. Good peripheral circulation. Grossly normal heart sounds. Respiratory: Tachypnea, positive accessory respiratory muscle use, diffuse rhonchi with  wheezing. Gastrointestinal: Soft and nontender. No distention.  Musculoskeletal: No lower extremity tenderness nor edema. No gross deformities of extremities. Neurologic:  Normal speech and language. No gross focal neurologic deficits are appreciated.  Skin:  Skin is warm, dry and intact. No rash noted. Psychiatric: Mood and affect are normal. Speech and behavior are normal.  ____________________________________________   LABS (all labs ordered are listed, but only abnormal results are displayed)  Labs Reviewed  CBC WITH DIFFERENTIAL/PLATELET - Abnormal; Notable for the following components:      Result Value   RDW 15.6 (*)    Monocytes Absolute 1.2 (*)    All other components within normal limits  BASIC METABOLIC PANEL  BRAIN NATRIURETIC PEPTIDE  TROPONIN I  BLOOD GAS, VENOUS   ____________________________________________  EKG  ED ECG REPORT I,  N Darcey Cardy, the attending physician, personally viewed and  interpreted this ECG.   Date: 04/26/2017  EKG Time: 2:06 AM  Rate: 101  Rhythm: Sinus tachycardia  Axis: Normal  Intervals: Normal  ST&T Change: None  ____________________________________________  RADIOLOGY I, [place] N Jaylin Benzel, personally viewed and evaluated these images (plain radiographs) as part of my medical decision making, as well as reviewing the written report by the radiologist.  Dg Chest Portable 1 View  Result Date: 04/26/2017 CLINICAL DATA:  Respiratory distress, shortness of breath. History of lung cancer. EXAM: PORTABLE CHEST 1 VIEW COMPARISON:  Chest radiograph April 21, 2017 FINDINGS: Mild chronic interstitial changes increased lung volumes. Similar LEFT costophrenic angle scarring. Improved aeration of lung bases. Cardiomediastinal silhouette is normal, mildly calcified aortic op arch. No pleural effusion or focal consolidation. No pneumothorax. Soft tissue planes included osseous structures are non suspicious. Old LEFT mid clavicle fracture.  IMPRESSION: COPD, no acute cardiopulmonary process. Aortic Atherosclerosis (ICD10-I70.0) and Emphysema (ICD10-J43.9). Electronically Signed   By: Elon Alas M.D.   On: 04/26/2017 02:45    ____________________________________________   PROCEDURES  Critical Care performed:CRITICAL CARE Performed by: Gregor Hams   Total critical care time: 30 minutes  Critical care time was exclusive of separately billable procedures and treating other patients.  Critical care was necessary to treat or prevent imminent or life-threatening deterioration.  Critical care was time spent personally by me on the following activities: development of treatment plan with patient and/or surrogate as well as nursing, discussions with consultants, evaluation of patient's response to treatment, examination of patient, obtaining history from patient or surrogate, ordering and performing treatments and interventions, ordering and review of laboratory studies, ordering and review of radiographic studies, pulse oximetry and re-evaluation of patient's condition.  Procedures   ____________________________________________   INITIAL IMPRESSION / ASSESSMENT AND PLAN / ED COURSE  As part of my medical decision making, I reviewed the following data within the electronic MEDICAL RECORD [id number] year old male presented with above-stated history and physical exam of respiratory distress most likely secondary to acute COPD exacerbation.  Consider the possibility of pneumonia and as such chest x-ray was performed which was consistent with COPD no evidence of pneumonia.  Patient with apparent rest or distress on arrival in such BiPAP was applied Solu-Medrol 125 mg administered.  On reevaluation patient's respiratory status markedly improved with improvement of tachypnea as well as accessory muscle use.  Patient discussed with Dr. Jannifer Franklin for hospital admission for further evaluation and management of COPD  exacerbation. ____________________________________________  FINAL CLINICAL IMPRESSION(S) / ED DIAGNOSES  Final diagnoses:  COPD exacerbation ([place])     MEDICATIONS GIVEN DURING THIS VISIT:  Medications  methylPREDNISolone sodium succinate (SOLU-MEDROL) 125 mg/2 mL injection 125 mg (125 mg Intravenous Given 04/26/17 0241)     ED Discharge Orders    None       Note:  This document was prepared using Dragon voice recognition software and may include unintentional dictation errors.    Gregor Hams, MD 04/26/17 [phone]

## 2017-04-26 NOTE — Progress Notes (Signed)
[phone] placed pt on n/c 4lpm watched for 10 min tolerating well sat remains stable  sat96 % hr 82 rr20 no  Signs of resp distess noted, bipap remains st the bed side.

## 2017-04-26 NOTE — Progress Notes (Signed)
Pt alert and oriented x4, no complaints of pain or discomfort.  Bed in low position, call bell within reach.  Bed alarms on and functioning.  Assessment done and charted.  Will continue to monitor and do hourly rounding throughout the shift 

## 2017-04-26 NOTE — ED Notes (Signed)
Pt taken off bipap per RT, placed on [facility]

## 2017-04-26 NOTE — Progress Notes (Signed)
Tele called to report pt was in junctional rhythm,  Dr Posey Pronto called and made aware that pt was adm with NSR 88, after meds given, one hr late he was sinus brady at 59 and now junctional with HR 54

## 2017-04-26 NOTE — ED Notes (Signed)
Pt maintaining 96% on [facility]

## 2017-04-27 DIAGNOSIS — J9601 Acute respiratory failure with hypoxia: Secondary | ICD-10-CM

## 2017-04-27 DIAGNOSIS — R911 Solitary pulmonary nodule: Secondary | ICD-10-CM

## 2017-04-27 LAB — CBC WITH DIFFERENTIAL/PLATELET
Basophils Absolute: 0 10*3/uL (ref 0–0.1)
Basophils Relative: 0 %
EOS ABS: 0 10*3/uL (ref 0–0.7)
EOS PCT: 0 %
HCT: 42.3 % (ref 40.0–52.0)
Hemoglobin: 14.4 g/dL (ref 13.0–18.0)
LYMPHS ABS: 0.8 10*3/uL — AB (ref 1.0–3.6)
Lymphocytes Relative: 3 %
MCH: 27.4 pg (ref 26.0–34.0)
MCHC: 33.9 g/dL (ref 32.0–36.0)
MCV: 80.9 fL (ref 80.0–100.0)
Monocytes Absolute: 1.2 10*3/uL — ABNORMAL HIGH (ref 0.2–1.0)
Monocytes Relative: 5 %
Neutro Abs: 22.3 10*3/uL — ABNORMAL HIGH (ref 1.4–6.5)
Neutrophils Relative %: 92 %
PLATELETS: 283 10*3/uL (ref 150–440)
RBC: 5.24 MIL/uL (ref 4.40–5.90)
RDW: 15.6 % — ABNORMAL HIGH (ref 11.5–14.5)
WBC: 24.3 10*3/uL — AB (ref 3.8–10.6)

## 2017-04-27 LAB — CBC
HCT: 41.8 % (ref 40.0–52.0)
Hemoglobin: 14 g/dL (ref 13.0–18.0)
MCH: 27.2 pg (ref 26.0–34.0)
MCHC: 33.6 g/dL (ref 32.0–36.0)
MCV: 81.1 fL (ref 80.0–100.0)
Platelets: 253 10*3/uL (ref 150–440)
RBC: 5.15 MIL/uL (ref 4.40–5.90)
RDW: 15.3 % — AB (ref 11.5–14.5)
WBC: 15 10*3/uL — ABNORMAL HIGH (ref 3.8–10.6)

## 2017-04-27 LAB — BASIC METABOLIC PANEL
Anion gap: 8 (ref 5–15)
BUN: 21 mg/dL — AB (ref 6–20)
CALCIUM: 9.1 mg/dL (ref 8.9–10.3)
CO2: 26 mmol/L (ref 22–32)
CREATININE: 0.82 mg/dL (ref 0.61–1.24)
Chloride: 103 mmol/L (ref 101–111)
GFR calc non Af Amer: >60 mL/min (ref >60)
Glucose, Bld: 170 mg/dL — ABNORMAL HIGH (ref 65–99)
Potassium: 4.6 mmol/L (ref 3.5–5.1)
Sodium: 137 mmol/L (ref 135–145)

## 2017-04-27 NOTE — Consult Note (Signed)
[place] [facility] Consultation      Assessment and Plan:  Acute COPD exacerbation with acute bronchitis. Nicotine abuse. -Continue bronchodilators, inhaled steroids. -Okay to discharge from respiratory standpoint with steroid taper. -We will need long-term outpatient follow-up with pulmonary.  Patient was given my card and asked to follow-up within 2-3 weeks.  Lung Nodule.  --Pt did not follow up previously. Discussed with patient re importance of outpatient follow up.  --He will likely require a PET scan and/or biopsy.   Pt otherwise stable for discharge from respiratory standpoint. Pulmonary service will sign off for now.   Date: 04/27/2017  MRN# [id number] Ruben [name] 1955/07/23  Referring Physician: Dr. Posey Pronto.   Ruben [name] is a 62 y.o. old male seen in consultation for chief complaint of:    Chief Complaint  Patient presents with  . Respiratory Distress    HPI:  The patient is a 62 year old male with no history of COPD, depression, stroke, coronary artery disease, lung cancer. The patient has previous admissions to the hospital due to COPD exacerbation.  Currently does not see a pulmonologist.  . He has a long history of smoking, he smoked 2 packs a day up until last week, and is now committed to quitting.  He worked as a Systems developer.  He has dyspnea on moderate to severe exertion, though he does not find that his ADLs are interrupted due to dyspnea.  He does feel that his breathing is back compared to several years ago and knows that he needs to do something about it.  He was admitted to hospital in May 2018, at that time it was noted that he had a right middle lobe pneumonia with atelectasis.  He could not undergo bronchoscopy due to respiratory distress, and was asked to follow-up outpatient, but did not.  Currently is noted to have both right middle lobe and right upper lobe nodules.  Imaging personally reviewed, CT chest 04/26/17; right middle lobe  nodule, small subcarinal and right hilar lymphadenopathy.  PMHX:   Past Medical History:  Diagnosis Date  . Cancer ([place])    lung cancer  . COPD (chronic obstructive pulmonary disease) ([place])   . Coronary artery disease   . Depression   . Hypertension   . Myocardial infarction ([place])   . Stroke [facility])    Surgical Hx:  Past Surgical History:  Procedure Laterality Date  . CARDIAC CATHETERIZATION    . ELBOW SURGERY Left   . FLEXIBLE BRONCHOSCOPY Bilateral 08/27/2016   Procedure: FLEXIBLE BRONCHOSCOPY;  Surgeon: Allyne Gee, MD;  Location: [facility] ORS;  Service: Pulmonary;  Laterality: Bilateral;  . WRIST SURGERY Left    Family Hx:  Family History  Problem Relation Age of Onset  . Heart disease Unknown   . Hypertension Unknown   . Diabetes Unknown   . CAD Mother    Social Hx:   Social History   Tobacco Use  . Smoking status: Former Smoker    Packs/day: 1.00    Years: 43.00    Pack years: 43.00    Types: Cigarettes  . Smokeless tobacco: Never Used  Substance Use Topics  . Alcohol use: No  . Drug use: Yes    Types: Marijuana   Medication:    Current Facility-Administered Medications:  .  acetaminophen (TYLENOL) tablet 650 mg, 650 mg, Oral, Q6H PRN, 650 mg at 04/26/17 1136 **OR** acetaminophen (TYLENOL) suppository 650 mg, 650 mg, Rectal, Q6H PRN, Harrie Foreman, MD .  acetylcysteine ([place])  20 % nebulizer / oral solution 4 mL, 4 mL, Nebulization, BID, Dustin Flock, MD, 4 mL at 04/27/17 0759 .  apixaban (ELIQUIS) tablet 5 mg, 5 mg, Oral, BID, Harrie Foreman, MD, 5 mg at 04/27/17 3762 .  azithromycin (ZITHROMAX) 500 mg in dextrose 5 % 250 mL IVPB, 500 mg, Intravenous, Q24H, Harrie Foreman, MD, Stopped at 04/27/17 [phone]  budesonide (PULMICORT) nebulizer solution 0.25 mg, 0.25 mg, Nebulization, BID, Dustin Flock, MD, 0.25 mg at 04/27/17 0759 .  clonazePAM (KLONOPIN) tablet 0.5 mg, 0.5 mg, Oral, TID PRN, Harrie Foreman, MD, 0.5 mg at 04/26/17 1136 .   diltiazem (CARDIZEM CD) 24 hr capsule 360 mg, 360 mg, Oral, Daily, Harrie Foreman, MD, 360 mg at 04/27/17 0831 .  docusate sodium (COLACE) capsule 100 mg, 100 mg, Oral, BID, Harrie Foreman, MD, 100 mg at 04/27/17 1761 .  escitalopram (LEXAPRO) tablet 20 mg, 20 mg, Oral, BH-q7a, Harrie Foreman, MD, 20 mg at 04/27/17 6073 .  guaiFENesin (MUCINEX) 12 hr tablet 600 mg, 600 mg, Oral, BID, Harrie Foreman, MD, 600 mg at 04/27/17 7106 .  Influenza vac split quadrivalent PF (FLUARIX) injection 0.5 mL, 0.5 mL, Intramuscular, Tomorrow-1000, Lawrence Marseilles, RN .  ipratropium-albuterol (DUONEB) 0.5-2.5 (3) MG/3ML nebulizer solution 3 mL, 3 mL, Nebulization, Q6H, Dustin Flock, MD, 3 mL at 04/27/17 0759 .  methylPREDNISolone sodium succinate (SOLU-MEDROL) 125 mg/2 mL injection 60 mg, 60 mg, Intravenous, Q6H, Dustin Flock, MD, 60 mg at 04/27/17 0832 .  metoprolol tartrate (LOPRESSOR) tablet 50 mg, 50 mg, Oral, BID, Harrie Foreman, MD, 50 mg at 04/27/17 2694 .  mirtazapine (REMERON) tablet 15 mg, 15 mg, Oral, QHS, Harrie Foreman, MD, 15 mg at 04/26/17 2143 .  ondansetron (ZOFRAN) tablet 4 mg, 4 mg, Oral, Q6H PRN **OR** ondansetron (ZOFRAN) injection 4 mg, 4 mg, Intravenous, Q6H PRN, Harrie Foreman, MD .  pantoprazole (PROTONIX) EC tablet 40 mg, 40 mg, Oral, Daily, Harrie Foreman, MD, 40 mg at 04/27/17 8546 .  simvastatin (ZOCOR) tablet 20 mg, 20 mg, Oral, BH-q7a, Harrie Foreman, MD, 20 mg at 04/27/17 2703 .  traZODone (DESYREL) tablet 150 mg, 150 mg, Oral, QHS, Harrie Foreman, MD, 150 mg at 04/26/17 2143   Allergies:  Patient has no known allergies.  Review of Systems: Gen:  Denies  fever, sweats, chills HEENT: Denies blurred vision, double vision. bleeds, sore throat Cvc:  No dizziness, chest pain. Resp:   Denies cough or sputum production, shortness of breath Gi: Denies swallowing difficulty, stomach pain. Gu:  Denies bladder incontinence, burning urine Ext:    No Joint pain, stiffness. Skin: No skin rash,  hives  Endoc:  No polyuria, polydipsia. Psych: No depression, insomnia. Other:  All other systems were reviewed with the patient and were negative other that what is mentioned in the HPI.   Physical Examination:   VS: BP (!) 117/59 (BP Location: Right Arm)   Pulse (!) 53   Temp 97.7 F (36.5 C) (Oral)   Resp 15   Ht _0  (1.803 m)   Wt 144 lb 8 oz (65.5 kg)   SpO2 99%   BMI 20.15 kg/m   General Appearance: No distress  Neuro:without focal findings,  speech normal,  HEENT: PERRLA, EOM intact.   Pulmonary: normal breath sounds, No wheezing.  CardiovascularNormal S1,S2.  No m/r/g.   Abdomen: Benign, Soft, non-tender. Renal:  No costovertebral tenderness  GU:  No performed at this time. Endoc:  No evident thyromegaly, no signs of acromegaly. Skin:   warm, no rashes, no ecchymosis  Extremities: normal, no cyanosis, clubbing.  Other findings:    LABORATORY PANEL:   CBC Recent Labs  Lab 04/27/17 0428  WBC 15.0*  HGB 14.0  HCT 41.8  PLT 253   ------------------------------------------------------------------------------------------------------------------  Chemistries  Recent Labs  Lab 04/27/17 0428  NA 137  K 4.6  CL 103  CO2 26  GLUCOSE 170*  BUN 21*  CREATININE 0.82  CALCIUM 9.1   ------------------------------------------------------------------------------------------------------------------  Cardiac Enzymes Recent Labs  Lab 04/26/17 0208  TROPONINI <0.03   ------------------------------------------------------------  RADIOLOGY:  Ct Chest W Contrast  Result Date: 04/26/2017 CLINICAL DATA:  History of lung cancer and COPD. Now with progressive dyspnea over the past week. Dyspnea refractory to nebulized breathing treatments today. Tachypnea. EXAM: CT CHEST WITH CONTRAST TECHNIQUE: Multidetector CT imaging of the chest was performed during intravenous contrast administration. CONTRAST:  63m ISOVUE-300  IOPAMIDOL (ISOVUE-300) INJECTION 61% COMPARISON:  Chest CT dated 08/26/2016. chest CT dated 09/20/2010. FINDINGS: Cardiovascular: Heart size is normal. No significant pericardial effusions seen. No thoracic aortic aneurysm or evidence of dissection. Aortic atherosclerosis. Mediastinum/Nodes: The enlarged right paratracheal lymph node demonstrated on earlier chest CT has resolved. The subcarinal lymph node described on the previous study is now normal in size. There are no enlarged or morphologically abnormal lymph nodes identified within the mediastinum. The borderline enlarged lymph node in the right hilum, measuring 13 mm short axis dimension, is stable or slightly smaller. Esophagus appears normal.  Trachea appears normal. Lungs/Pleura: Again noted is bilateral emphysematous change, moderate in degree, stable compared to the most recent study of 08/26/2016. Nodular/masslike consolidation within the right middle lobe, measuring 2.3 x 1.6 cm (series 3, image 121), more masslike in appearance on today's study, new compared to the earlier chest CT of 09/20/2010. Additional nodular consolidation at the right lung apex measures 1.3 cm greatest dimension, most likely confluent nodular scarring, also new compared to earlier chest CT of 09/20/2010. Stable mild atelectasis at the lung bases posteriorly. Stable chronic thickening of the perihilar and lower lobe bronchi, with some associated debris filling the bronchi of the right lower lobe. Upper Abdomen: Limited images of the upper abdomen are unremarkable. Musculoskeletal: Mild degenerative spurring within the lower thoracic spine. No acute or suspicious osseous finding. IMPRESSION: 1. Emphysema, moderate in degree, similar to the earlier study of 08/26/2016. 2. Nodular/masslike consolidation within the right middle lobe, measuring 2.3 cm, more masslike in appearance on today's study and new compared to the earlier chest CT of 09/20/2010. This may represent sequela of  previous infectious or inflammatory process, however, neoplastic process cannot be excluded. Acute pneumonia is considered less likely. Consider one of the following in 3 months for both low-risk and high-risk individuals: (a) repeat chest CT, (b) follow-up PET-CT, or (c) tissue sampling. This recommendation follows the consensus statement: Guidelines for Management of Incidental Pulmonary Nodules Detected on CT Images: From the Fleischner Society 2017; Radiology 2017; 284:228-243. I believe PET-CT would be the most beneficial. 3. Additional nodular consolidation within the right lung apex, measuring 1.3 cm, most likely confluent nodular scarring, neoplastic process again not excluded. Again, I believe PET-CT would be most helpful for further characterization of all findings. 4. Stable chronic thickening of the bilateral perihilar and lower lobe bronchi, indicating chronic bronchitic change, with some associated debris filling and distending multiple bronchial segments to the right lower lobe. Aortic Atherosclerosis (ICD10-I70.0) and Emphysema (ICD10-J43.9). Electronically Signed   By: SCherlynn Kaiser  Enriqueta Shutter M.D.   On: 04/26/2017 15:08   Dg Chest Portable 1 View  Result Date: 04/26/2017 CLINICAL DATA:  Respiratory distress, shortness of breath. History of lung cancer. EXAM: PORTABLE CHEST 1 VIEW COMPARISON:  Chest radiograph April 21, 2017 FINDINGS: Mild chronic interstitial changes increased lung volumes. Similar LEFT costophrenic angle scarring. Improved aeration of lung bases. Cardiomediastinal silhouette is normal, mildly calcified aortic op arch. No pleural effusion or focal consolidation. No pneumothorax. Soft tissue planes included osseous structures are non suspicious. Old LEFT mid clavicle fracture. IMPRESSION: COPD, no acute cardiopulmonary process. Aortic Atherosclerosis (ICD10-I70.0) and Emphysema (ICD10-J43.9). Electronically Signed   By: Elon Alas M.D.   On: 04/26/2017 02:45       Thank  you  for the consultation and for allowing [facility] to assist in the care of your patient. Our recommendations are noted above.  Please contact us if we can be of further service.   Marda Stalker, MD.  Board Certified in Internal Medicine, Pulmonary Medicine, [place], and Sleep Medicine.  [facility] Office Number: [phone]  Patricia Pesa, M.D.  Merton Border, M.D  04/27/2017

## 2017-04-27 NOTE — Care Management Note (Addendum)
Case Management Note  Patient Details  Name: Ruben [name] MRN: [id number] Date of Birth: November 27, 1955  Subjective/Objective:   Admitted to [facility]  with the diagnosis of acute respiratory failure.  Lives with cousin [name] . Call [facility] for emergencies [phone]). No primary care physician since Dr. Brynda Greathouse retired. States he tried to get Dr. Lavera Guise. "They are taking medicaid patients. No home Health. No skilled facility. No home oxygen. The only medical equipment in the home is a nebulizer. Takes care of all basic activities of daily living himself, drives. No falls. Good appetite.  Family will transport.                  Action/Plan: Dr. Rance Muir office in [place] will accept [name].    Expected Discharge Date:  04/28/17               Expected Discharge Plan:     In-House Referral:   yes  Discharge planning Services   yes  Post Acute Care Choice:    Choice offered to:     DME Arranged:    DME Agency:     HH Arranged:    HH Agency:     Status of Service:     If discussed at [employer], dates discussed:    Additional Comments:  Shelbie Ammons, RN MSN CCM Care Management [phone] 04/27/2017, 9:22 AM

## 2017-04-27 NOTE — Progress Notes (Signed)
Please note patient has a pending out patient palliative referral.  At the time of the referral patient was in the ED and had no PCP. Per [place] patient now has an appointment with Dr. Alice Rieger office, set for 1/28.  Information given to referral. Flo Shanks RN, BSN, [place] [facility] [place], [facility] [phone]

## 2017-04-27 NOTE — Progress Notes (Signed)
[place] at [facility]                                                                                                                                                                                  Ruben [name] Demographics   Ruben [name], is a 62 y.o. male, DOB - 12-06-1955, PYP:[id number]  Admit date - 04/26/2017   Admitting Physician Harrie Foreman, MD  Outpatient Primary MD for the Ruben [name] is Ruben [name], No Pcp Per   LOS - 1  Subjective: Pt still short of breath states that is better still has cough and congestionstill has cough and congestion  Review of Systems:   CONSTITUTIONAL: No documented fever. No fatigue, weakness. No weight gain, no weight loss.  EYES: No blurry or double vision.  ENT: No tinnitus. No postnasal drip. No redness of the oropharynx.  RESPIRATORY:   As needed cough, no wheeze, no hemoptysis.  Positive dyspnea.  CARDIOVASCULAR: No chest pain. No orthopnea. No palpitations. No syncope.  GASTROINTESTINAL: No nausea, no vomiting or diarrhea. No abdominal pain. No melena or hematochezia.  GENITOURINARY: No dysuria or hematuria.  ENDOCRINE: No polyuria or nocturia. No heat or cold intolerance.  HEMATOLOGY: No anemia. No bruising. No bleeding.  INTEGUMENTARY: No rashes. No lesions.  MUSCULOSKELETAL: No arthritis. No swelling. No gout.  NEUROLOGIC: No numbness, tingling, or ataxia. No seizure-type activity.  PSYCHIATRIC: No anxiety. No insomnia. No ADD.    Vitals:   Vitals:   04/26/17 1933 04/27/17 0141 04/27/17 0325 04/27/17 0500  BP: 130/64  (!) 117/59   Pulse: 61  (!) 53   Resp: 16  15   Temp: 98.1 F (36.7 C)  97.7 F (36.5 C)   TempSrc: Oral  Oral   SpO2: 96% 92% 99%   Weight:    144 lb 8 oz (65.5 kg)  Height:        Wt Readings from Last 3 Encounters:  04/27/17 144 lb 8 oz (65.5 kg)  04/21/17 145 lb (65.8 kg)  08/25/16 144 lb 13.5 oz (65.7 kg)     Intake/Output Summary (Last 24 hours) at 04/27/2017 1407 Last data  filed at 04/27/2017 0643 Gross per 24 hour  Intake 490 ml  Output 1300 ml  Net -810 ml    Physical Exam:   GENERAL: Pleasant-appearing in no apparent distress.  HEAD, EYES, EARS, NOSE AND THROAT: Atraumatic, normocephalic. Extraocular muscles are intact. Pupils equal and reactive to light. Sclerae anicteric. No conjunctival injection. No oro-pharyngeal erythema.  NECK: Supple. There is no jugular venous distention. No bruits, no lymphadenopathy, no thyromegaly.  HEART: Regular rate and rhythm,. No murmurs, no rubs, no clicks.  LUNGS: As needed  wheezing bilaterally without any accessory muscle usage ABDOMEN: Soft, flat, nontender, nondistended. Has good bowel sounds. No hepatosplenomegaly appreciated.  EXTREMITIES: No evidence of any cyanosis, clubbing, or peripheral edema.  +2 pedal and radial pulses bilaterally.  NEUROLOGIC: The Ruben [name] is alert, awake, and oriented x3 with no focal motor or sensory deficits appreciated bilaterally.  SKIN: Moist and warm with no rashes appreciated.  Psych: Not anxious, depressed LN: No inguinal LN enlargement    Antibiotics   Anti-infectives (From admission, onward)   Start     Dose/Rate Route Frequency Ordered Stop   04/26/17 0800  azithromycin (ZITHROMAX) 500 mg in dextrose 5 % 250 mL IVPB     500 mg 250 mL/hr over 60 Minutes Intravenous Every 24 hours 04/26/17 0758        Medications   Scheduled Meds: . acetylcysteine  4 mL Nebulization BID  . apixaban  5 mg Oral BID  . budesonide (PULMICORT) nebulizer solution  0.25 mg Nebulization BID  . diltiazem  360 mg Oral Daily  . docusate sodium  100 mg Oral BID  . escitalopram  20 mg Oral BH-q7a  . guaiFENesin  600 mg Oral BID  . Influenza vac split quadrivalent PF  0.5 mL Intramuscular Tomorrow-1000  . ipratropium-albuterol  3 mL Nebulization Q6H  . methylPREDNISolone (SOLU-MEDROL) injection  60 mg Intravenous Q6H  . metoprolol tartrate  50 mg Oral BID  . mirtazapine  15 mg Oral QHS  .  pantoprazole  40 mg Oral Daily  . simvastatin  20 mg Oral BH-q7a  . traZODone  150 mg Oral QHS   Continuous Infusions: . azithromycin Stopped (04/27/17 0948)   PRN Meds:.acetaminophen **OR** acetaminophen, clonazePAM, ondansetron **OR** ondansetron (ZOFRAN) IV   Data Review:   Micro Results Recent Results (from the past 240 hour(s))  MRSA PCR Screening     Status: None   Collection Time: 04/26/17  1:40 PM  Result Value Ref Range Status   MRSA by PCR NEGATIVE NEGATIVE Final    Comment:        The GeneXpert MRSA Assay (FDA approved for NASAL specimens only), is one component of a comprehensive MRSA colonization surveillance program. It is not intended to diagnose MRSA infection nor to guide or monitor treatment for MRSA infections. Performed at [facility], [address], [place], [place] [address]     Radiology Reports Dg Chest 2 View  Result Date: 04/21/2017 CLINICAL DATA:  Shortness of breath, cough EXAM: CHEST  2 VIEW COMPARISON:  08/30/2016 FINDINGS: There is hyperinflation of the lungs compatible with COPD. Heart and mediastinal contours are within normal limits. No focal opacities or effusions. No acute bony abnormality. IMPRESSION: COPD.  No active disease. Electronically Signed   By: Rolm Baptise M.D.   On: 04/21/2017 10:11   Ct Chest W Contrast  Result Date: 04/26/2017 CLINICAL DATA:  History of lung cancer and COPD. Now with progressive dyspnea over the past week. Dyspnea refractory to nebulized breathing treatments today. Tachypnea. EXAM: CT CHEST WITH CONTRAST TECHNIQUE: Multidetector CT imaging of the chest was performed during intravenous contrast administration. CONTRAST:  26mL ISOVUE-300 IOPAMIDOL (ISOVUE-300) INJECTION 61% COMPARISON:  Chest CT dated 08/26/2016. chest CT dated 09/20/2010. FINDINGS: Cardiovascular: Heart size is normal. No significant pericardial effusions seen. No thoracic aortic aneurysm or evidence of dissection. Aortic  atherosclerosis. Mediastinum/Nodes: The enlarged right paratracheal lymph node demonstrated on earlier chest CT has resolved. The subcarinal lymph node described on the previous study is now normal in size. There are  no enlarged or morphologically abnormal lymph nodes identified within the mediastinum. The borderline enlarged lymph node in the right hilum, measuring 13 mm short axis dimension, is stable or slightly smaller. Esophagus appears normal.  Trachea appears normal. Lungs/Pleura: Again noted is bilateral emphysematous change, moderate in degree, stable compared to the most recent study of 08/26/2016. Nodular/masslike consolidation within the right middle lobe, measuring 2.3 x 1.6 cm (series 3, image 121), more masslike in appearance on today's study, new compared to the earlier chest CT of 09/20/2010. Additional nodular consolidation at the right lung apex measures 1.3 cm greatest dimension, most likely confluent nodular scarring, also new compared to earlier chest CT of 09/20/2010. Stable mild atelectasis at the lung bases posteriorly. Stable chronic thickening of the perihilar and lower lobe bronchi, with some associated debris filling the bronchi of the right lower lobe. Upper Abdomen: Limited images of the upper abdomen are unremarkable. Musculoskeletal: Mild degenerative spurring within the lower thoracic spine. No acute or suspicious osseous finding. IMPRESSION: 1. Emphysema, moderate in degree, similar to the earlier study of 08/26/2016. 2. Nodular/masslike consolidation within the right middle lobe, measuring 2.3 cm, more masslike in appearance on today's study and new compared to the earlier chest CT of 09/20/2010. This may represent sequela of previous infectious or inflammatory process, however, neoplastic process cannot be excluded. Acute pneumonia is considered less likely. Consider one of the following in 3 months for both low-risk and high-risk individuals: (a) repeat chest CT, (b) follow-up  PET-CT, or (c) tissue sampling. This recommendation follows the consensus statement: Guidelines for Management of Incidental Pulmonary Nodules Detected on CT Images: From the Fleischner Society 2017; Radiology 2017; 284:228-243. I believe PET-CT would be the most beneficial. 3. Additional nodular consolidation within the right lung apex, measuring 1.3 cm, most likely confluent nodular scarring, neoplastic process again not excluded. Again, I believe PET-CT would be most helpful for further characterization of all findings. 4. Stable chronic thickening of the bilateral perihilar and lower lobe bronchi, indicating chronic bronchitic change, with some associated debris filling and distending multiple bronchial segments to the right lower lobe. Aortic Atherosclerosis (ICD10-I70.0) and Emphysema (ICD10-J43.9). Electronically Signed   By: Franki Cabot M.D.   On: 04/26/2017 15:08   Dg Chest Portable 1 View  Result Date: 04/26/2017 CLINICAL DATA:  Respiratory distress, shortness of breath. History of lung cancer. EXAM: PORTABLE CHEST 1 VIEW COMPARISON:  Chest radiograph April 21, 2017 FINDINGS: Mild chronic interstitial changes increased lung volumes. Similar LEFT costophrenic angle scarring. Improved aeration of lung bases. Cardiomediastinal silhouette is normal, mildly calcified aortic op arch. No pleural effusion or focal consolidation. No pneumothorax. Soft tissue planes included osseous structures are non suspicious. Old LEFT mid clavicle fracture. IMPRESSION: COPD, no acute cardiopulmonary process. Aortic Atherosclerosis (ICD10-I70.0) and Emphysema (ICD10-J43.9). Electronically Signed   By: Elon Alas M.D.   On: 04/26/2017 02:45     CBC Recent Labs  Lab 04/21/17 0949 04/26/17 0208 04/27/17 0428  WBC 9.1 10.4 15.0*  HGB 15.4 15.9 14.0  HCT 46.7 47.0 41.8  PLT 338 305 253  MCV 81.3 81.7 81.1  MCH 26.9 27.7 27.2  MCHC 33.0 33.8 33.6  RDW 15.8* 15.6* 15.3*  LYMPHSABS  --  3.4  --   MONOABS   --  1.2*  --   EOSABS  --  0.2  --   BASOSABS  --  0.1  --     Chemistries  Recent Labs  Lab 04/21/17 0949 04/26/17 0208 04/27/17 0428  NA 138  138 137  K 4.1 4.2 4.6  CL 102 101 103  CO2 28 28 26   GLUCOSE 107* 95 170*  BUN 9 17 21*  CREATININE 0.89 0.95 0.82  CALCIUM 9.4 9.3 9.1   ------------------------------------------------------------------------------------------------------------------ estimated creatinine clearance is 87.6 mL/min (by C-G formula based on SCr of 0.82 mg/dL). ------------------------------------------------------------------------------------------------------------------ No results for input(s): HGBA1C in the last 72 hours. ------------------------------------------------------------------------------------------------------------------ No results for input(s): CHOL, HDL, LDLCALC, TRIG, CHOLHDL, LDLDIRECT in the last 72 hours. ------------------------------------------------------------------------------------------------------------------ No results for input(s): TSH, T4TOTAL, T3FREE, THYROIDAB in the last 72 hours.  Invalid input(s): FREET3 ------------------------------------------------------------------------------------------------------------------ No results for input(s): VITAMINB12, FOLATE, FERRITIN, TIBC, IRON, RETICCTPCT in the last 72 hours.  Coagulation profile No results for input(s): INR, PROTIME in the last 168 hours.  No results for input(s): DDIMER in the last 72 hours.  Cardiac Enzymes Recent Labs  Lab 04/21/17 0949 04/26/17 0208  TROPONINI <0.03 <0.03   ------------------------------------------------------------------------------------------------------------------ Invalid input(s): POCBNP    Assessment & Plan   This is a 62 year old male admitted for respiratory failure. 1.    Acute on chronic respiratory failure with hypoxia: continue nebs and Solu-Medrol every 6 hours,  2.    Acute on chronic COPD: With  exacerbation; continue inhaled corticosteroid as well as anticholinergic.  Albuterol scheduled.,   3.  Lung nodules outpatient follow-up with PET or bx 4. Hypertension: Controlled; continue metoprolol 5.  Tobacco abuse: Continues to smoke; counseled on smoking cessation. 4 minutes spent, recommended to stop smoking nicotine patch offered 6.  Atrial fibrillation: Discontinue digoxin due to slow heart rate continue metoprolol, Eliquis  7.  Hyperlipidemia: Continue statin therapy 8  Depression: Continue Lexapro and Remeron 9.  DVT prophylaxis: As above 10.  GI prophylaxis: None          Code Status Orders  (From admission, onward)        Start     Ordered   04/26/17 0759  Full code  Continuous     04/26/17 0758    Code Status History    Date Active Date Inactive Code Status Order ID Comments User Context   08/25/2016 06:26 09/01/2016 15:40 Full Code [id number]  Saundra Shelling, MD Inpatient   06/23/2016 18:56 06/26/2016 15:14 Full Code [id number]  Gladstone Lighter, MD Inpatient   06/28/2015 13:09 07/01/2015 19:36 Full Code [id number]  Bettey Costa, MD Inpatient   06/28/2015 11:09 06/28/2015 11:15 DNR [id number]  Bettey Costa, MD ED   09/25/2014 04:34 09/26/2014 14:41 DNR [id number]  Lance Coon, MD Inpatient           Consults  10min   DVT Prophylaxis  eliquis  Lab Results  Component Value Date   PLT 253 04/27/2017     Time Spent in minutes   87min  Greater than 50% of time spent in care coordination and counseling Ruben [name] regarding the condition and plan of care.   Dustin Flock M.D on 04/27/2017 at 2:07 PM  Between 7am to 6pm - Pager - [phone]  After 6pm go to [email or web address] - password [email or web address] [place] [facility]   Office  [phone]

## 2017-04-28 LAB — BASIC METABOLIC PANEL
ANION GAP: 7 (ref 5–15)
BUN: 24 mg/dL — ABNORMAL HIGH (ref 6–20)
CALCIUM: 9 mg/dL (ref 8.9–10.3)
CO2: 28 mmol/L (ref 22–32)
CREATININE: 1 mg/dL (ref 0.61–1.24)
Chloride: 101 mmol/L (ref 101–111)
Glucose, Bld: 130 mg/dL — ABNORMAL HIGH (ref 65–99)
Potassium: 4.3 mmol/L (ref 3.5–5.1)
SODIUM: 136 mmol/L (ref 135–145)

## 2017-04-28 MED ORDER — PREDNISONE 50 MG PO TABS
50.0000 mg | ORAL_TABLET | Freq: Every day | ORAL | Status: DC
  Administered 2017-04-29: 50 mg via ORAL
  Filled 2017-04-28: qty 1

## 2017-04-28 NOTE — Progress Notes (Signed)
SATURATION QUALIFICATIONS: (This note is used to comply with regulatory documentation for home oxygen)  Patient Saturations on Room Air at Rest = 92%  Patient Saturations on Room Air while Ambulating =91%     Please briefly explain why patient needs home oxygen:

## 2017-04-28 NOTE — Plan of Care (Signed)
  Clinical Measurements: Respiratory complications will improve 04/28/2017 1522 - Progressing by Oris Drone, RN  -able to wean pt to Room Air this shift; 91% RA while ambulating around the nurse's station Activity: Risk for activity intolerance will decrease 04/28/2017 1522 - Progressing by Oris Drone, RN  -able to ambulate on room air around the nurse's station Coping: Level of anxiety will decrease 04/28/2017 1522 - Progressing by Oris Drone, RN  -prn Klonopin given with decreased anxiety Safety: Ability to remain free from injury will improve 04/28/2017 1522 - Progressing by Oris Drone, RN  -pt remains on Moderate Fall Risks; independent in room

## 2017-04-28 NOTE — Progress Notes (Addendum)
[place] at [facility]                                                                                                                                                                                  Patient Demographics   Ruben [name], is a 62 y.o. male, DOB - 01/05/1956, VZC:[id number]  Admit date - 04/26/2017   Admitting Physician Harrie Foreman, MD  Outpatient Primary MD for the patient is Patient, No Pcp Per   LOS - 2  Subjective: Better short of breath, still has cough and congestions, off O2 [place].  Review of Systems:   CONSTITUTIONAL: No documented fever. No fatigue, weakness. No weight gain, no weight loss.  EYES: No blurry or double vision.  ENT: No tinnitus. No postnasal drip. No redness of the oropharynx.  RESPIRATORY:   As needed cough, no wheeze, no hemoptysis.  no dyspnea.  CARDIOVASCULAR: No chest pain. No orthopnea. No palpitations. No syncope.  GASTROINTESTINAL: No nausea, no vomiting or diarrhea. No abdominal pain. No melena or hematochezia.  GENITOURINARY: No dysuria or hematuria.  ENDOCRINE: No polyuria or nocturia. No heat or cold intolerance.  HEMATOLOGY: No anemia. No bruising. No bleeding.  INTEGUMENTARY: No rashes. No lesions.  MUSCULOSKELETAL: No arthritis. No swelling. No gout.  NEUROLOGIC: No numbness, tingling, or ataxia. No seizure-type activity.  PSYCHIATRIC: No anxiety. No insomnia. No ADD.    Vitals:   Vitals:   04/28/17 0209 04/28/17 0459 04/28/17 1028 04/28/17 1223  BP:  (!) 122/56  (!) 112/53  Pulse:  71  (!) 55  Resp:  15  18  Temp:  97.6 F (36.4 C)  97.8 F (36.6 C)  TempSrc:  Oral  Oral  SpO2: 96% 98% 91% 93%  Weight:  149 lb 9.6 oz (67.9 kg)    Height:        Wt Readings from Last 3 Encounters:  04/28/17 149 lb 9.6 oz (67.9 kg)  04/21/17 145 lb (65.8 kg)  08/25/16 144 lb 13.5 oz (65.7 kg)     Intake/Output Summary (Last 24 hours) at 04/28/2017 1525 Last data filed at 04/28/2017 1459 Gross per  24 hour  Intake 958 ml  Output 650 ml  Net 308 ml    Physical Exam:   GENERAL: Pleasant-appearing in no apparent distress.  HEAD, EYES, EARS, NOSE AND THROAT: Atraumatic, normocephalic. Extraocular muscles are intact. Pupils equal and reactive to light. Sclerae anicteric. No conjunctival injection. No oro-pharyngeal erythema.  NECK: Supple. There is no jugular venous distention. No bruits, no lymphadenopathy, no thyromegaly.  HEART: Regular rate and rhythm,. No murmurs, no rubs, no clicks.  LUNGS: Diminished lung sounds but no wheezing, without  any accessory muscle usage ABDOMEN: Soft, flat, nontender, nondistended. Has good bowel sounds. No hepatosplenomegaly appreciated.  EXTREMITIES: No evidence of any cyanosis, clubbing, or peripheral edema.  +2 pedal and radial pulses bilaterally.  NEUROLOGIC: The patient is alert, awake, and oriented x3 with no focal motor or sensory deficits appreciated bilaterally.  SKIN: Moist and warm with no rashes appreciated.  Psych: Not anxious, depressed LN: No inguinal LN enlargement    Antibiotics   Anti-infectives (From admission, onward)   Start     Dose/Rate Route Frequency Ordered Stop   04/26/17 0800  azithromycin (ZITHROMAX) 500 mg in dextrose 5 % 250 mL IVPB  Status:  Discontinued     500 mg 250 mL/hr over 60 Minutes Intravenous Every 24 hours 04/26/17 0758 04/28/17 1347      Medications   Scheduled Meds: . acetylcysteine  4 mL Nebulization BID  . apixaban  5 mg Oral BID  . budesonide (PULMICORT) nebulizer solution  0.25 mg Nebulization BID  . diltiazem  360 mg Oral Daily  . docusate sodium  100 mg Oral BID  . escitalopram  20 mg Oral BH-q7a  . guaiFENesin  600 mg Oral BID  . ipratropium-albuterol  3 mL Nebulization Q6H  . metoprolol tartrate  50 mg Oral BID  . mirtazapine  15 mg Oral QHS  . pantoprazole  40 mg Oral Daily  . [START ON 04/29/2017] predniSONE  50 mg Oral Q breakfast  . simvastatin  20 mg Oral BH-q7a  . traZODone   150 mg Oral QHS   Continuous Infusions:  PRN Meds:.acetaminophen **OR** acetaminophen, clonazePAM, ondansetron **OR** ondansetron (ZOFRAN) IV   Data Review:   Micro Results Recent Results (from the past 240 hour(s))  MRSA PCR Screening     Status: None   Collection Time: 04/26/17  1:40 PM  Result Value Ref Range Status   MRSA by PCR NEGATIVE NEGATIVE Final    Comment:        The GeneXpert MRSA Assay (FDA approved for NASAL specimens only), is one component of a comprehensive MRSA colonization surveillance program. It is not intended to diagnose MRSA infection nor to guide or monitor treatment for MRSA infections. Performed at [facility], [address] [place], [place] [address]     Radiology Reports Dg Chest 2 View  Result Date: 04/21/2017 CLINICAL DATA:  Shortness of breath, cough EXAM: CHEST  2 VIEW COMPARISON:  08/30/2016 FINDINGS: There is hyperinflation of the lungs compatible with COPD. Heart and mediastinal contours are within normal limits. No focal opacities or effusions. No acute bony abnormality. IMPRESSION: COPD.  No active disease. Electronically Signed   By: Rolm Baptise M.D.   On: 04/21/2017 10:11   Ct Chest W Contrast  Result Date: 04/26/2017 CLINICAL DATA:  History of lung cancer and COPD. Now with progressive dyspnea over the past week. Dyspnea refractory to nebulized breathing treatments today. Tachypnea. EXAM: CT CHEST WITH CONTRAST TECHNIQUE: Multidetector CT imaging of the chest was performed during intravenous contrast administration. CONTRAST:  57mL ISOVUE-300 IOPAMIDOL (ISOVUE-300) INJECTION 61% COMPARISON:  Chest CT dated 08/26/2016. chest CT dated 09/20/2010. FINDINGS: Cardiovascular: Heart size is normal. No significant pericardial effusions seen. No thoracic aortic aneurysm or evidence of dissection. Aortic atherosclerosis. Mediastinum/Nodes: The enlarged right paratracheal lymph node demonstrated on earlier chest CT has resolved. The  subcarinal lymph node described on the previous study is now normal in size. There are no enlarged or morphologically abnormal lymph nodes identified within the mediastinum. The borderline enlarged lymph  node in the right hilum, measuring 13 mm short axis dimension, is stable or slightly smaller. Esophagus appears normal.  Trachea appears normal. Lungs/Pleura: Again noted is bilateral emphysematous change, moderate in degree, stable compared to the most recent study of 08/26/2016. Nodular/masslike consolidation within the right middle lobe, measuring 2.3 x 1.6 cm (series 3, image 121), more masslike in appearance on today's study, new compared to the earlier chest CT of 09/20/2010. Additional nodular consolidation at the right lung apex measures 1.3 cm greatest dimension, most likely confluent nodular scarring, also new compared to earlier chest CT of 09/20/2010. Stable mild atelectasis at the lung bases posteriorly. Stable chronic thickening of the perihilar and lower lobe bronchi, with some associated debris filling the bronchi of the right lower lobe. Upper Abdomen: Limited images of the upper abdomen are unremarkable. Musculoskeletal: Mild degenerative spurring within the lower thoracic spine. No acute or suspicious osseous finding. IMPRESSION: 1. Emphysema, moderate in degree, similar to the earlier study of 08/26/2016. 2. Nodular/masslike consolidation within the right middle lobe, measuring 2.3 cm, more masslike in appearance on today's study and new compared to the earlier chest CT of 09/20/2010. This may represent sequela of previous infectious or inflammatory process, however, neoplastic process cannot be excluded. Acute pneumonia is considered less likely. Consider one of the following in 3 months for both low-risk and high-risk individuals: (a) repeat chest CT, (b) follow-up PET-CT, or (c) tissue sampling. This recommendation follows the consensus statement: Guidelines for Management of Incidental  Pulmonary Nodules Detected on CT Images: From the Fleischner Society 2017; Radiology 2017; 284:228-243. I believe PET-CT would be the most beneficial. 3. Additional nodular consolidation within the right lung apex, measuring 1.3 cm, most likely confluent nodular scarring, neoplastic process again not excluded. Again, I believe PET-CT would be most helpful for further characterization of all findings. 4. Stable chronic thickening of the bilateral perihilar and lower lobe bronchi, indicating chronic bronchitic change, with some associated debris filling and distending multiple bronchial segments to the right lower lobe. Aortic Atherosclerosis (ICD10-I70.0) and Emphysema (ICD10-J43.9). Electronically Signed   By: Franki Cabot M.D.   On: 04/26/2017 15:08   Dg Chest Portable 1 View  Result Date: 04/26/2017 CLINICAL DATA:  Respiratory distress, shortness of breath. History of lung cancer. EXAM: PORTABLE CHEST 1 VIEW COMPARISON:  Chest radiograph April 21, 2017 FINDINGS: Mild chronic interstitial changes increased lung volumes. Similar LEFT costophrenic angle scarring. Improved aeration of lung bases. Cardiomediastinal silhouette is normal, mildly calcified aortic op arch. No pleural effusion or focal consolidation. No pneumothorax. Soft tissue planes included osseous structures are non suspicious. Old LEFT mid clavicle fracture. IMPRESSION: COPD, no acute cardiopulmonary process. Aortic Atherosclerosis (ICD10-I70.0) and Emphysema (ICD10-J43.9). Electronically Signed   By: Elon Alas M.D.   On: 04/26/2017 02:45     CBC Recent Labs  Lab 04/26/17 0208 04/27/17 0428 04/27/17 1537  WBC 10.4 15.0* 24.3*  HGB 15.9 14.0 14.4  HCT 47.0 41.8 42.3  PLT 305 253 283  MCV 81.7 81.1 80.9  MCH 27.7 27.2 27.4  MCHC 33.8 33.6 33.9  RDW 15.6* 15.3* 15.6*  LYMPHSABS 3.4  --  0.8*  MONOABS 1.2*  --  1.2*  EOSABS 0.2  --  0.0  BASOSABS 0.1  --  0.0    Chemistries  Recent Labs  Lab 04/26/17 0208  04/27/17 0428 04/28/17 0445  NA 138 137 136  K 4.2 4.6 4.3  CL 101 103 101  CO2 28 26 28   GLUCOSE 95 170* 130*  BUN 17 21* 24*  CREATININE 0.95 0.82 1.00  CALCIUM 9.3 9.1 9.0   ------------------------------------------------------------------------------------------------------------------ estimated creatinine clearance is 74.5 mL/min (by C-G formula based on SCr of 1 mg/dL). ------------------------------------------------------------------------------------------------------------------ No results for input(s): HGBA1C in the last 72 hours. ------------------------------------------------------------------------------------------------------------------ No results for input(s): CHOL, HDL, LDLCALC, TRIG, CHOLHDL, LDLDIRECT in the last 72 hours. ------------------------------------------------------------------------------------------------------------------ No results for input(s): TSH, T4TOTAL, T3FREE, THYROIDAB in the last 72 hours.  Invalid input(s): FREET3 ------------------------------------------------------------------------------------------------------------------ No results for input(s): VITAMINB12, FOLATE, FERRITIN, TIBC, IRON, RETICCTPCT in the last 72 hours.  Coagulation profile No results for input(s): INR, PROTIME in the last 168 hours.  No results for input(s): DDIMER in the last 72 hours.  Cardiac Enzymes Recent Labs  Lab 04/26/17 0208  TROPONINI <0.03   ------------------------------------------------------------------------------------------------------------------ Invalid input(s): POCBNP    Assessment & Plan   This is a 62 year old male admitted for respiratory failure. 1.    Acute on chronic respiratory failure with hypoxia: continue nebs and discontinue Solu-Medrol every 6 hours, change to p.o. Prednisone in am. 2.    Acute on chronic COPD: With exacerbation; continue inhaled corticosteroid as well as anticholinergic.  Albuterol scheduled.,   3.   Lung nodules outpatient follow-up with PET or bx 4. Hypertension: Controlled; continue metoprolol 5.  Tobacco abuse: Continues to smoke; counseled on smoking cessation. 4 minutes spent, recommended to stop smoking nicotine patch offered 6.  Atrial fibrillation: Discontinued digoxin due to slow heart rate continue metoprolol, Eliquis  7.  Hyperlipidemia: Continue statin therapy 8  Depression: Continue Lexapro and Remeron 9.  DVT prophylaxis: As above 10.  GI prophylaxis: None       Code Status Orders  (From admission, onward)        Start     Ordered   04/26/17 0759  Full code  Continuous     04/26/17 0758    Code Status History    Date Active Date Inactive Code Status Order ID Comments User Context   08/25/2016 06:26 09/01/2016 15:40 Full Code [id number]  Saundra Shelling, MD Inpatient   06/23/2016 18:56 06/26/2016 15:14 Full Code [id number]  Gladstone Lighter, MD Inpatient   06/28/2015 13:09 07/01/2015 19:36 Full Code [id number]  Bettey Costa, MD Inpatient   06/28/2015 11:09 06/28/2015 11:15 DNR [id number]  Bettey Costa, MD ED   09/25/2014 04:34 09/26/2014 14:41 DNR [id number]  Lance Coon, MD Inpatient           Consults  35 min   DVT Prophylaxis  eliquis  Lab Results  Component Value Date   PLT 283 04/27/2017     Time Spent in minutes   33 min  Greater than 50% of time spent in care coordination and counseling patient regarding the condition and plan of care.   Demetrios Loll M.D on 04/28/2017 at 3:25 PM  Between 7am to 6pm - Pager - [phone]  After 6pm go to [email or web address] - password [email or web address] [place] [facility]   Office  [phone]

## 2017-04-28 NOTE — Care Management (Signed)
An appointment has been made for 1/28 at [facility] for 05/09/2017.  Palliative should be ordered in the discharge summary.  Patient will require a home oxygen assessment

## 2017-04-28 NOTE — Care Management (Signed)
So far has not qualified for home 02

## 2017-04-28 NOTE — Discharge Instructions (Signed)
Smoking cessation  

## 2017-04-29 MED ORDER — ESCITALOPRAM OXALATE 20 MG PO TABS: 20.0000 mg | ORAL_TABLET | ORAL | 0 refills | Status: DC

## 2017-04-29 MED ORDER — DILTIAZEM HCL ER COATED BEADS 360 MG PO CP24: 360.0000 mg | ORAL_CAPSULE | Freq: Every day | ORAL | 0 refills | Status: DC

## 2017-04-29 MED ORDER — ALBUTEROL SULFATE HFA 108 (90 BASE) MCG/ACT IN AERS: 2.0000 | INHALATION_SPRAY | Freq: Four times a day (QID) | RESPIRATORY_TRACT | 2 refills | Status: DC | PRN

## 2017-04-29 MED ORDER — METOPROLOL TARTRATE 50 MG PO TABS: 50.0000 mg | ORAL_TABLET | Freq: Two times a day (BID) | ORAL | 0 refills | Status: DC

## 2017-04-29 MED ORDER — TRAZODONE HCL 150 MG PO TABS: 150.0000 mg | ORAL_TABLET | Freq: Every day | ORAL | 0 refills | Status: DC

## 2017-04-29 MED ORDER — OMEPRAZOLE 40 MG PO CPDR: 40.0000 mg | DELAYED_RELEASE_CAPSULE | Freq: Every day | ORAL | 0 refills | Status: DC

## 2017-04-29 MED ORDER — IPRATROPIUM-ALBUTEROL 0.5-2.5 (3) MG/3ML IN SOLN: 3.0000 mL | Freq: Four times a day (QID) | RESPIRATORY_TRACT | 0 refills | Status: DC | PRN

## 2017-04-29 MED ORDER — CLONAZEPAM 0.5 MG PO TABS: 0.5000 mg | ORAL_TABLET | Freq: Three times a day (TID) | ORAL | 0 refills | Status: DC | PRN

## 2017-04-29 NOTE — Progress Notes (Signed)
MD order received to discharge pt home today; verbally reviewed AVS with pt, gave 8 Rxs to pt; no questions voiced at this time; pt's discharge pending arrival of his ride home

## 2017-04-29 NOTE — Discharge Summary (Signed)
[place] at [place] NAME: Ruben [name]    MR#:  [id number]  DATE OF BIRTH:  10/30/1955  DATE OF ADMISSION:  04/26/2017   ADMITTING PHYSICIAN: Harrie Foreman, MD  DATE OF DISCHARGE: 04/29/2017 PRIMARY CARE PHYSICIAN: Patient, No Pcp Per   ADMISSION DIAGNOSIS:  COPD exacerbation ([place]) [J44.1] Acute respiratory failure ([place]) [J96.00] DISCHARGE DIAGNOSIS:  Active Problems:   Acute respiratory failure ([place])  SECONDARY DIAGNOSIS:   Past Medical History:  Diagnosis Date  . Cancer ([place])    lung cancer  . COPD (chronic obstructive pulmonary disease) ([place])   . Coronary artery disease   . Depression   . Hypertension   . Myocardial infarction ([place])   . Stroke [facility])    HOSPITAL COURSE:  This is a 62 year old male admitted for respiratory failure. 1.   Acute on chronic respiratory failure with hypoxia:  Off O2 , no wheezing or SOB, continue nebs and discontinued Solu-Medrol every 6 hours, changed to p.o. Prednisone. 2.   Acute on chronic COPD: With exacerbation; continue inhaled corticosteroid as well as anticholinergic. Albuterol scheduled.,   3.  Lung nodules outpatient follow-up with PET or bx 4. Hypertension: Controlled; continue metoprolol 5. Tobacco abuse: Continues to smoke; counseled on smoking cessation. 4 minutes spent, recommended to stop smoking nicotine patch offered 6. Atrial fibrillation: Discontinued digoxin due to slow heart rate continue metoprolol, Eliquis  7. Hyperlipidemia: Continue statin therapy 8 Depression: Continue Lexapro and Remeron  DISCHARGE CONDITIONS:  Stable, discharge to home today. CONSULTS OBTAINED:   DRUG ALLERGIES:  No Known Allergies DISCHARGE MEDICATIONS:   Allergies as of 04/29/2017   No Known Allergies     Medication List    STOP taking these medications   budesonide 0.5 MG/2ML nebulizer solution Commonly known as:  PULMICORT   guaiFENesin 600 MG 12 hr tablet Commonly  known as:  MUCINEX     TAKE these medications   ADVAIR DISKUS 250-50 MCG/DOSE Aepb Generic drug:  Fluticasone-Salmeterol Inhale 1 puff into the lungs 2 (two) times daily.   albuterol 108 (90 Base) MCG/ACT inhaler Commonly known as:  PROVENTIL HFA;VENTOLIN HFA Inhale 2 puffs into the lungs every 6 (six) hours as needed for wheezing or shortness of breath.   apixaban 5 MG Tabs tablet Commonly known as:  ELIQUIS Take 1 tablet (5 mg total) by mouth 2 (two) times daily.   clonazePAM 0.5 MG tablet Commonly known as:  KLONOPIN Take 1 tablet (0.5 mg total) by mouth 3 (three) times daily as needed. For anxiety   digoxin 0.25 MG tablet Commonly known as:  LANOXIN Take 1 tablet (0.25 mg total) by mouth daily.   diltiazem 360 MG 24 hr capsule Commonly known as:  CARDIZEM CD Take 1 capsule (360 mg total) by mouth daily.   escitalopram 20 MG tablet Commonly known as:  LEXAPRO Take 1 tablet (20 mg total) by mouth every morning.   ipratropium-albuterol 0.5-2.5 (3) MG/3ML Soln Commonly known as:  DUONEB Take 3 mLs by nebulization 4 (four) times daily as needed. For shortness of breath/wheezing.   metoprolol tartrate 50 MG tablet Commonly known as:  LOPRESSOR Take 1 tablet (50 mg total) by mouth 2 (two) times daily.   mirtazapine 15 MG tablet Commonly known as:  REMERON Take 1 tablet (15 mg total) by mouth at bedtime.   omeprazole 40 MG capsule Commonly known as:  PRILOSEC Take 1 capsule (40 mg total) by mouth daily.   simvastatin 20 MG  tablet Commonly known as:  ZOCOR Take 1 tablet (20 mg total) by mouth every morning.   traZODone 150 MG tablet Commonly known as:  DESYREL Take 1 tablet (150 mg total) by mouth at bedtime.        DISCHARGE INSTRUCTIONS:  See AVS.  If you experience worsening of your admission symptoms, develop shortness of breath, life threatening emergency, suicidal or homicidal thoughts you must seek medical attention immediately by calling 911 or  calling your MD immediately  if symptoms less severe.  You Must read complete instructions/literature along with all the possible adverse reactions/side effects for all the Medicines you take and that have been prescribed to you. Take any new Medicines after you have completely understood and accpet all the possible adverse reactions/side effects.   Please note  You were cared for by a hospitalist during your hospital stay. If you have any questions about your discharge medications or the care you received while you were in the hospital after you are discharged, you can call the unit and asked to speak with the hospitalist on call if the hospitalist that took care of you is not available. Once you are discharged, your primary care physician will handle any further medical issues. Please note that NO REFILLS for any discharge medications will be authorized once you are discharged, as it is imperative that you return to your primary care physician (or establish a relationship with a primary care physician if you do not have one) for your aftercare needs so that they can reassess your need for medications and monitor your lab values.    On the day of Discharge:  VITAL SIGNS:  Blood pressure (!) 150/75, pulse 88, temperature 98.7 F (37.1 C), temperature source Oral, resp. rate (!) 22, height 5\' 11"  (1.803 m), weight 152 lb 5 oz (69.1 kg), SpO2 94 %. PHYSICAL EXAMINATION:  GENERAL:  62 y.o.-year-old patient lying in the bed with no acute distress.  EYES: Pupils equal, round, reactive to light and accommodation. No scleral icterus. Extraocular muscles intact.  HEENT: Head atraumatic, normocephalic. Oropharynx and nasopharynx clear.  NECK:  Supple, no jugular venous distention. No thyroid enlargement, no tenderness.  LUNGS: Normal breath sounds bilaterally, no wheezing, rales,rhonchi or crepitation. No use of accessory muscles of respiration.  CARDIOVASCULAR: S1, S2 normal. No murmurs, rubs, or  gallops.  ABDOMEN: Soft, non-tender, non-distended. Bowel sounds present. No organomegaly or mass.  EXTREMITIES: No pedal edema, cyanosis, or clubbing.  NEUROLOGIC: Cranial nerves II through XII are intact. Muscle strength 5/5 in all extremities. Sensation intact. Gait not checked.  PSYCHIATRIC: The patient is alert and oriented x 3.  SKIN: No obvious rash, lesion, or ulcer.  DATA REVIEW:   CBC Recent Labs  Lab 04/27/17 1537  WBC 24.3*  HGB 14.4  HCT 42.3  PLT 283    Chemistries  Recent Labs  Lab 04/28/17 0445  NA 136  K 4.3  CL 101  CO2 28  GLUCOSE 130*  BUN 24*  CREATININE 1.00  CALCIUM 9.0     Microbiology Results  Results for orders placed or performed during the hospital encounter of 04/26/17  MRSA PCR Screening     Status: None   Collection Time: 04/26/17  1:40 PM  Result Value Ref Range Status   MRSA by PCR NEGATIVE NEGATIVE Final    Comment:        The GeneXpert MRSA Assay (FDA approved for NASAL specimens only), is one component of a comprehensive MRSA colonization surveillance program.  It is not intended to diagnose MRSA infection nor to guide or monitor treatment for MRSA infections. Performed at [facility], [address], [place], [place] [address]     RADIOLOGY:  No results found.   Management plans discussed with the patient, family and they are in agreement.  CODE STATUS: Full Code   TOTAL TIME TAKING CARE OF THIS PATIENT: 36 minutes.    Demetrios Loll M.D on 04/29/2017 at 1:33 PM  Between 7am to 6pm - Pager - [phone]  After 6pm go to [email or web address] - Proofreader  [employer] [facility]  Office  [phone]  CC: Primary care physician; Patient, No Pcp Per   Note: This dictation was prepared with Dragon dictation along with smaller phrase technology. Any transcriptional errors that result from this process are unintentional.

## 2017-04-29 NOTE — Progress Notes (Signed)
Pt verbalized that his ride is at the visitor's entrance; auxillary discharged the pt via wheelchair to the visitor's entrance

## 2017-04-29 NOTE — Care Management (Signed)
Home palliative order requested from MD. RNCM following for home Home O2 potential:  SATURATION QUALIFICATIONS: (This note is used to comply with regulatory documentation for home oxygen)  Patient Saturations on Room Air at Rest =?%  Patient Saturations on Room Air while Ambulating = ?%  Patient Saturations on ? Liters of oxygen while Ambulating = ?%  Please briefly explain why patient needs home oxygen:?

## 2017-05-04 ENCOUNTER — Encounter: Payer: Self-pay | Admitting: Internal Medicine

## 2017-05-04 ENCOUNTER — Telehealth: Payer: Self-pay | Admitting: Internal Medicine

## 2017-05-04 NOTE — Telephone Encounter (Signed)
-----   Message from Clarisse Gouge sent at 04/28/2017  1:47 PM EST ----- Regarding: RE: HFU  lmov to schedule fu appt  ----- Message ----- From: Renelda Mom, LPN Sent: 12/14/90   2:21 PM To: [facility] Support Pool Subject: FW: HFU                                        Please schedule pt for a hosp f/u in 2-3 weeks with DR in a 30 minute slot for COPD & lung nodule.  Thanks, Misty   ----- Message ----- From: Laverle Hobby, MD Sent: 04/27/2017   1:42 PM To: [facility] Clinical Pool Subject: HFU                                            Pt needs hfu in 2-3 weeks for AECOPD and lung nodule.

## 2017-05-04 NOTE — Telephone Encounter (Signed)
Called number on file/spoke to a young lady she states this is NOT this patient's phone number and stated to take this number off she has no contact with patient  Then proceeded to hang up  Will send letter to patient  Will await call back from patient

## 2017-05-09 ENCOUNTER — Ambulatory Visit: Payer: Medicaid Other | Admitting: Family Medicine

## 2017-05-29 ENCOUNTER — Emergency Department: Payer: Medicaid Other

## 2017-05-29 ENCOUNTER — Other Ambulatory Visit: Payer: Self-pay

## 2017-05-29 ENCOUNTER — Inpatient Hospital Stay
Admission: EM | Admit: 2017-05-29 | Discharge: 2017-05-31 | DRG: 191 | Disposition: A | Discharge status: Home / Self Care | Payer: Medicaid Other | Attending: Internal Medicine | Admitting: Internal Medicine

## 2017-05-29 DIAGNOSIS — K219 Gastro-esophageal reflux disease without esophagitis: Secondary | ICD-10-CM | POA: Diagnosis present

## 2017-05-29 DIAGNOSIS — R0603 Acute respiratory distress: Secondary | ICD-10-CM

## 2017-05-29 DIAGNOSIS — Z8673 Personal history of transient ischemic attack (TIA), and cerebral infarction without residual deficits: Secondary | ICD-10-CM | POA: Diagnosis not present

## 2017-05-29 DIAGNOSIS — Z66 Do not resuscitate: Secondary | ICD-10-CM | POA: Diagnosis present

## 2017-05-29 DIAGNOSIS — Z716 Tobacco abuse counseling: Secondary | ICD-10-CM | POA: Diagnosis not present

## 2017-05-29 DIAGNOSIS — F329 Major depressive disorder, single episode, unspecified: Secondary | ICD-10-CM | POA: Diagnosis present

## 2017-05-29 DIAGNOSIS — Z8249 Family history of ischemic heart disease and other diseases of the circulatory system: Secondary | ICD-10-CM | POA: Diagnosis not present

## 2017-05-29 DIAGNOSIS — Z9114 Patient's other noncompliance with medication regimen: Secondary | ICD-10-CM

## 2017-05-29 DIAGNOSIS — J42 Unspecified chronic bronchitis: Secondary | ICD-10-CM

## 2017-05-29 DIAGNOSIS — Z7901 Long term (current) use of anticoagulants: Secondary | ICD-10-CM | POA: Diagnosis not present

## 2017-05-29 DIAGNOSIS — Z87891 Personal history of nicotine dependence: Secondary | ICD-10-CM

## 2017-05-29 DIAGNOSIS — I2510 Atherosclerotic heart disease of native coronary artery without angina pectoris: Secondary | ICD-10-CM | POA: Diagnosis present

## 2017-05-29 DIAGNOSIS — C3490 Malignant neoplasm of unspecified part of unspecified bronchus or lung: Secondary | ICD-10-CM | POA: Diagnosis present

## 2017-05-29 DIAGNOSIS — I252 Old myocardial infarction: Secondary | ICD-10-CM | POA: Diagnosis not present

## 2017-05-29 DIAGNOSIS — Z79899 Other long term (current) drug therapy: Secondary | ICD-10-CM

## 2017-05-29 DIAGNOSIS — Z85118 Personal history of other malignant neoplasm of bronchus and lung: Secondary | ICD-10-CM | POA: Diagnosis not present

## 2017-05-29 DIAGNOSIS — I482 Chronic atrial fibrillation: Secondary | ICD-10-CM | POA: Diagnosis present

## 2017-05-29 DIAGNOSIS — J449 Chronic obstructive pulmonary disease, unspecified: Secondary | ICD-10-CM | POA: Diagnosis present

## 2017-05-29 DIAGNOSIS — R0902 Hypoxemia: Secondary | ICD-10-CM | POA: Diagnosis present

## 2017-05-29 DIAGNOSIS — I10 Essential (primary) hypertension: Secondary | ICD-10-CM | POA: Diagnosis present

## 2017-05-29 DIAGNOSIS — J441 Chronic obstructive pulmonary disease with (acute) exacerbation: Principal | ICD-10-CM

## 2017-05-29 DIAGNOSIS — F1290 Cannabis use, unspecified, uncomplicated: Secondary | ICD-10-CM | POA: Diagnosis present

## 2017-05-29 HISTORY — DX: Heart failure, unspecified: I50.9

## 2017-05-29 LAB — CBC WITH DIFFERENTIAL/PLATELET
BASOS PCT: 1 %
Basophils Absolute: 0.1 10*3/uL (ref 0–0.1)
EOS ABS: 0.1 10*3/uL (ref 0–0.7)
Eosinophils Relative: 1 %
HCT: 44.6 % (ref 40.0–52.0)
Hemoglobin: 14.7 g/dL (ref 13.0–18.0)
Lymphocytes Relative: 20 %
Lymphs Abs: 2 10*3/uL (ref 1.0–3.6)
MCH: 27.4 pg (ref 26.0–34.0)
MCHC: 32.9 g/dL (ref 32.0–36.0)
MCV: 83.3 fL (ref 80.0–100.0)
Monocytes Absolute: 0.9 10*3/uL (ref 0.2–1.0)
Monocytes Relative: 9 %
NEUTROS PCT: 69 %
Neutro Abs: 6.6 10*3/uL — ABNORMAL HIGH (ref 1.4–6.5)
Platelets: 301 10*3/uL (ref 150–440)
RBC: 5.36 MIL/uL (ref 4.40–5.90)
RDW: 14.8 % — ABNORMAL HIGH (ref 11.5–14.5)
WBC: 9.6 10*3/uL (ref 3.8–10.6)

## 2017-05-29 LAB — COMPREHENSIVE METABOLIC PANEL
ALK PHOS: 59 U/L (ref 38–126)
ALT: 8 U/L — ABNORMAL LOW (ref 17–63)
ANION GAP: 13 (ref 5–15)
AST: 15 U/L (ref 15–41)
Albumin: 4 g/dL (ref 3.5–5.0)
BILIRUBIN TOTAL: 0.7 mg/dL (ref 0.3–1.2)
BUN: 8 mg/dL (ref 6–20)
CALCIUM: 9.1 mg/dL (ref 8.9–10.3)
CO2: 23 mmol/L (ref 22–32)
Chloride: 101 mmol/L (ref 101–111)
Creatinine, Ser: 0.91 mg/dL (ref 0.61–1.24)
GFR calc non Af Amer: >60 mL/min (ref >60)
Glucose, Bld: 92 mg/dL (ref 65–99)
Potassium: 4.7 mmol/L (ref 3.5–5.1)
SODIUM: 137 mmol/L (ref 135–145)
TOTAL PROTEIN: 7.3 g/dL (ref 6.5–8.1)

## 2017-05-29 LAB — INFLUENZA PANEL BY PCR (TYPE A & B)
INFLAPCR: NEGATIVE
INFLBPCR: NEGATIVE

## 2017-05-29 LAB — TROPONIN I: Troponin I: <0.03 ng/mL (ref <0.03)

## 2017-05-29 MED ORDER — METOPROLOL TARTRATE 50 MG PO TABS
50.0000 mg | ORAL_TABLET | Freq: Two times a day (BID) | ORAL | Status: DC
  Administered 2017-05-29: 23:00:00 50 mg via ORAL
  Filled 2017-05-29: qty 1

## 2017-05-29 MED ORDER — ENOXAPARIN SODIUM 40 MG/0.4ML ~~LOC~~ SOLN: 40.0000 mg | SUBCUTANEOUS | Status: DC

## 2017-05-29 MED ORDER — IPRATROPIUM-ALBUTEROL 0.5-2.5 (3) MG/3ML IN SOLN
3.0000 mL | Freq: Four times a day (QID) | RESPIRATORY_TRACT | Status: DC
  Administered 2017-05-30 – 2017-05-31 (×5): 3 mL via RESPIRATORY_TRACT
  Filled 2017-05-29 (×6): qty 3

## 2017-05-29 MED ORDER — POLYETHYLENE GLYCOL 3350 17 G PO PACK: 17.0000 g | PACK | Freq: Every day | ORAL | Status: DC | PRN

## 2017-05-29 MED ORDER — PANTOPRAZOLE SODIUM 40 MG PO TBEC
40.0000 mg | DELAYED_RELEASE_TABLET | Freq: Every day | ORAL | Status: DC
  Administered 2017-05-30 – 2017-05-31 (×2): 40 mg via ORAL
  Filled 2017-05-29 (×2): qty 1

## 2017-05-29 MED ORDER — LACTATED RINGERS IV SOLN
INTRAVENOUS | Status: DC
  Administered 2017-05-29: 23:00:00 via INTRAVENOUS

## 2017-05-29 MED ORDER — GUAIFENESIN ER 600 MG PO TB12
600.0000 mg | ORAL_TABLET | Freq: Two times a day (BID) | ORAL | Status: DC
  Administered 2017-05-29 – 2017-05-31 (×4): 600 mg via ORAL
  Filled 2017-05-29 (×4): qty 1

## 2017-05-29 MED ORDER — DIGOXIN 250 MCG PO TABS
0.2500 mg | ORAL_TABLET | Freq: Every day | ORAL | Status: DC
  Filled 2017-05-29: qty 1

## 2017-05-29 MED ORDER — DILTIAZEM HCL ER COATED BEADS 360 MG PO CP24
360.0000 mg | ORAL_CAPSULE | Freq: Every day | ORAL | Status: DC
  Filled 2017-05-29: qty 1

## 2017-05-29 MED ORDER — ESCITALOPRAM OXALATE 20 MG PO TABS
20.0000 mg | ORAL_TABLET | ORAL | Status: DC
  Administered 2017-05-30 – 2017-05-31 (×2): 20 mg via ORAL
  Filled 2017-05-29 (×2): qty 1

## 2017-05-29 MED ORDER — SODIUM CHLORIDE 0.9 % IV SOLN
500.0000 mg | INTRAVENOUS | Status: DC
  Filled 2017-05-29: qty 500

## 2017-05-29 MED ORDER — IPRATROPIUM-ALBUTEROL 0.5-2.5 (3) MG/3ML IN SOLN
3.0000 mL | Freq: Once | RESPIRATORY_TRACT | Status: AC
  Administered 2017-05-29: 3 mL via RESPIRATORY_TRACT
  Filled 2017-05-29: qty 3

## 2017-05-29 MED ORDER — NICOTINE 14 MG/24HR TD PT24
14.0000 mg | MEDICATED_PATCH | Freq: Every day | TRANSDERMAL | Status: DC
  Administered 2017-05-29 – 2017-05-31 (×3): 14 mg via TRANSDERMAL
  Filled 2017-05-29 (×3): qty 1

## 2017-05-29 MED ORDER — MIRTAZAPINE 15 MG PO TABS
15.0000 mg | ORAL_TABLET | Freq: Every day | ORAL | Status: DC
  Administered 2017-05-29 – 2017-05-30 (×2): 15 mg via ORAL
  Filled 2017-05-29 (×2): qty 1

## 2017-05-29 MED ORDER — SODIUM CHLORIDE 0.9 % IV BOLUS (SEPSIS)
1000.0000 mL | Freq: Once | INTRAVENOUS | Status: AC
Start: 2017-05-29 — End: 2017-05-29
  Administered 2017-05-29: 1000 mL via INTRAVENOUS

## 2017-05-29 MED ORDER — DOCUSATE SODIUM 100 MG PO CAPS
100.0000 mg | ORAL_CAPSULE | Freq: Two times a day (BID) | ORAL | Status: DC
  Administered 2017-05-29 – 2017-05-31 (×4): 100 mg via ORAL
  Filled 2017-05-29 (×4): qty 1

## 2017-05-29 MED ORDER — ONDANSETRON HCL 4 MG/2ML IJ SOLN: 4.0000 mg | Freq: Four times a day (QID) | INTRAMUSCULAR | Status: DC | PRN

## 2017-05-29 MED ORDER — SODIUM CHLORIDE 0.9 % IV SOLN
500.0000 mg | Freq: Once | INTRAVENOUS | Status: AC
  Administered 2017-05-29: 500 mg via INTRAVENOUS
  Filled 2017-05-29: qty 500

## 2017-05-29 MED ORDER — METHYLPREDNISOLONE SODIUM SUCC 125 MG IJ SOLR
60.0000 mg | Freq: Four times a day (QID) | INTRAMUSCULAR | Status: DC
  Administered 2017-05-29 – 2017-05-31 (×6): 60 mg via INTRAVENOUS
  Filled 2017-05-29 (×6): qty 2

## 2017-05-29 MED ORDER — CLONAZEPAM 0.5 MG PO TABS
0.5000 mg | ORAL_TABLET | Freq: Three times a day (TID) | ORAL | Status: DC | PRN
  Administered 2017-05-29 – 2017-05-31 (×4): 0.5 mg via ORAL
  Filled 2017-05-29 (×4): qty 1

## 2017-05-29 MED ORDER — ACETAMINOPHEN 650 MG RE SUPP: 650.0000 mg | Freq: Four times a day (QID) | RECTAL | Status: DC | PRN

## 2017-05-29 MED ORDER — HYDROCODONE-ACETAMINOPHEN 5-325 MG PO TABS: 1.0000 | ORAL_TABLET | ORAL | Status: DC | PRN

## 2017-05-29 MED ORDER — MAGNESIUM SULFATE 2 GM/50ML IV SOLN
2.0000 g | Freq: Once | INTRAVENOUS | Status: AC
  Administered 2017-05-29: 2 g via INTRAVENOUS
  Filled 2017-05-29: qty 50

## 2017-05-29 MED ORDER — ACETAMINOPHEN 325 MG PO TABS: 650.0000 mg | ORAL_TABLET | Freq: Four times a day (QID) | ORAL | Status: DC | PRN

## 2017-05-29 MED ORDER — APIXABAN 5 MG PO TABS
5.0000 mg | ORAL_TABLET | Freq: Two times a day (BID) | ORAL | Status: DC
  Administered 2017-05-29 – 2017-05-31 (×4): 5 mg via ORAL
  Filled 2017-05-29 (×4): qty 1

## 2017-05-29 MED ORDER — SIMVASTATIN 20 MG PO TABS
20.0000 mg | ORAL_TABLET | ORAL | Status: DC
  Administered 2017-05-30 – 2017-05-31 (×2): 20 mg via ORAL
  Filled 2017-05-29 (×2): qty 1

## 2017-05-29 MED ORDER — ONDANSETRON HCL 4 MG PO TABS: 4.0000 mg | ORAL_TABLET | Freq: Four times a day (QID) | ORAL | Status: DC | PRN

## 2017-05-29 MED ORDER — TRAZODONE HCL 50 MG PO TABS
150.0000 mg | ORAL_TABLET | Freq: Every day | ORAL | Status: DC
  Administered 2017-05-29 – 2017-05-30 (×2): 150 mg via ORAL
  Filled 2017-05-29 (×2): qty 3

## 2017-05-29 MED ORDER — MOMETASONE FURO-FORMOTEROL FUM 200-5 MCG/ACT IN AERO
2.0000 | INHALATION_SPRAY | Freq: Two times a day (BID) | RESPIRATORY_TRACT | Status: DC
  Administered 2017-05-30 – 2017-05-31 (×3): 2 via RESPIRATORY_TRACT
  Filled 2017-05-29: qty 8.8

## 2017-05-29 NOTE — ED Notes (Signed)
Pt brought in via ems from home with resp distress.  On arrival pt placed on bipap.  Hx copd.  Pt reports sob for 3 days.  No fever.  No chest pain at this time. Sinus brady on the monitor at his time.  Pt alert. Skin warm and dry.

## 2017-05-29 NOTE — ED Triage Notes (Signed)
Pt arrived via EMS from home d/t respiratory distress. Pt reports coughing and congestion x3 days. EMS report pt in tripod position upon arrival. Pt has hx of COPD.

## 2017-05-29 NOTE — ED Notes (Signed)
Pt taken off bipap and oxygen by dr Mable Paris.

## 2017-05-29 NOTE — ED Notes (Signed)
Report called to [place] rn floor nurse

## 2017-05-29 NOTE — H&P (Signed)
[facility] NAME: Rance Smithson    MR#:  [id number]  DATE OF BIRTH:  02-Oct-1955  DATE OF ADMISSION:  05/29/2017  PRIMARY CARE PHYSICIAN: Patient, No Pcp Per   REQUESTING/REFERRING PHYSICIAN:   CHIEF COMPLAINT:   Chief Complaint  Patient presents with  . Respiratory Distress    HISTORY OF PRESENT ILLNESS: Kerrington Greenhalgh  is a 62 y.o. male with a known history per below presented to the emergency room with 3-day history of worsening shortness of breath, productive cough, noted to be hypoxic in the emergency room, chest x-ray was negative, no better with breathing treatments, patient with diffuse inspiratory expiratory wheezing, continued smoking, patient now be admitted for acute on COPD exacerbation.  PAST MEDICAL HISTORY:   Past Medical History:  Diagnosis Date  . Cancer ([place])    lung cancer  . COPD (chronic obstructive pulmonary disease) ([place])   . Coronary artery disease   . Depression   . Hypertension   . Myocardial infarction ([place])   . Stroke [facility])     PAST SURGICAL HISTORY:  Past Surgical History:  Procedure Laterality Date  . CARDIAC CATHETERIZATION    . ELBOW SURGERY Left   . FLEXIBLE BRONCHOSCOPY Bilateral 08/27/2016   Procedure: FLEXIBLE BRONCHOSCOPY;  Surgeon: Allyne Gee, MD;  Location: [facility] ORS;  Service: Pulmonary;  Laterality: Bilateral;  . WRIST SURGERY Left     SOCIAL HISTORY:  Social History   Tobacco Use  . Smoking status: Former Smoker    Packs/day: 1.00    Years: 43.00    Pack years: 43.00    Types: Cigarettes  . Smokeless tobacco: Never Used  Substance Use Topics  . Alcohol use: No    FAMILY HISTORY:  Family History  Problem Relation Age of Onset  . Heart disease Unknown   . Hypertension Unknown   . Diabetes Unknown   . CAD Mother     DRUG ALLERGIES: No Known Allergies  REVIEW OF SYSTEMS:   CONSTITUTIONAL: + fever, fatigue/weakness.  EYES: No blurred or double vision.  EARS,  NOSE, AND THROAT: No tinnitus or ear pain.  RESPIRATORY: + cough, shortness of breath, wheezing, no hemoptysis.  CARDIOVASCULAR: No chest pain, orthopnea, edema.  GASTROINTESTINAL: No nausea, vomiting, diarrhea or abdominal pain.  GENITOURINARY: No dysuria, hematuria.  ENDOCRINE: No polyuria, nocturia,  HEMATOLOGY: No anemia, easy bruising or bleeding SKIN: No rash or lesion. MUSCULOSKELETAL: No joint pain or arthritis.   NEUROLOGIC: No tingling, numbness, weakness.  PSYCHIATRY: No anxiety or depression.   MEDICATIONS AT HOME:  Prior to Admission medications   Medication Sig Start Date End Date Taking? Authorizing Provider  ADVAIR DISKUS 250-50 MCG/DOSE AEPB Inhale 1 puff into the lungs 2 (two) times daily. 04/21/17   Lisa Roca, MD  albuterol (PROVENTIL HFA;VENTOLIN HFA) 108 (90 Base) MCG/ACT inhaler Inhale 2 puffs into the lungs every 6 (six) hours as needed for wheezing or shortness of breath. 04/29/17   Demetrios Loll, MD  apixaban (ELIQUIS) 5 MG TABS tablet Take 1 tablet (5 mg total) by mouth 2 (two) times daily. 04/21/17   Lisa Roca, MD  clonazePAM (KLONOPIN) 0.5 MG tablet Take 1 tablet (0.5 mg total) by mouth 3 (three) times daily as needed. For anxiety 04/29/17   Demetrios Loll, MD  digoxin (LANOXIN) 0.25 MG tablet Take 1 tablet (0.25 mg total) by mouth daily. 04/21/17   Lisa Roca, MD  diltiazem (CARDIZEM CD) 360 MG 24 hr capsule Take 1 capsule (  360 mg total) by mouth daily. 04/29/17 05/29/17  Demetrios Loll, MD  escitalopram (LEXAPRO) 20 MG tablet Take 1 tablet (20 mg total) by mouth every morning. 04/29/17   Demetrios Loll, MD  ipratropium-albuterol (DUONEB) 0.5-2.5 (3) MG/3ML SOLN Take 3 mLs by nebulization 4 (four) times daily as needed. For shortness of breath/wheezing. 04/29/17   Demetrios Loll, MD  metoprolol tartrate (LOPRESSOR) 50 MG tablet Take 1 tablet (50 mg total) by mouth 2 (two) times daily. 04/29/17 04/29/18  Demetrios Loll, MD  mirtazapine (REMERON) 15 MG tablet Take 1 tablet (15 mg total)  by mouth at bedtime. 04/21/17   Lisa Roca, MD  omeprazole (PRILOSEC) 40 MG capsule Take 1 capsule (40 mg total) by mouth daily. 04/29/17   Demetrios Loll, MD  simvastatin (ZOCOR) 20 MG tablet Take 1 tablet (20 mg total) by mouth every morning. 04/21/17   Lisa Roca, MD  traZODone (DESYREL) 150 MG tablet Take 1 tablet (150 mg total) by mouth at bedtime. 04/29/17   Demetrios Loll, MD      PHYSICAL EXAMINATION:   VITAL SIGNS: Blood pressure 129/68, pulse (!) 56, temperature 97.7 F (36.5 C), temperature source Axillary, resp. rate (!) 24, height 6' (1.829 m), weight 72.6 kg (160 lb), SpO2 93 %.  GENERAL:  62 y.o.-year-old patient lying in the bed with no acute distress.  Frail-appearing EYES: Pupils equal, round, reactive to light and accommodation. No scleral icterus. Extraocular muscles intact.  HEENT: Head atraumatic, normocephalic. Oropharynx and nasopharynx clear.  NECK:  Supple, no jugular venous distention. No thyroid enlargement, no tenderness.  LUNGS: Diffuse inspiratory/expiratory wheezing/rhonchi  No use of accessory muscles of respiration.  CARDIOVASCULAR: S1, S2 normal. No murmurs, rubs, or gallops.  ABDOMEN: Soft, nontender, nondistended. Bowel sounds present. No organomegaly or mass.  EXTREMITIES: No pedal edema, cyanosis, or clubbing.  NEUROLOGIC: Cranial nerves II through XII are intact. Muscle strength 5/5 in all extremities. Sensation intact. Gait not checked.  PSYCHIATRIC: The patient is alert and oriented x 3.  SKIN: No obvious rash, lesion, or ulcer.   LABORATORY PANEL:   CBC Recent Labs  Lab 05/29/17 1925  WBC 9.6  HGB 14.7  HCT 44.6  PLT 301  MCV 83.3  MCH 27.4  MCHC 32.9  RDW 14.8*  LYMPHSABS 2.0  MONOABS 0.9  EOSABS 0.1  BASOSABS 0.1   ------------------------------------------------------------------------------------------------------------------  Chemistries  Recent Labs  Lab 05/29/17 1925  NA 137  K 4.7  CL 101  CO2 23  GLUCOSE 92  BUN 8   CREATININE 0.91  CALCIUM 9.1  AST 15  ALT 8*  ALKPHOS 59  BILITOT 0.7   ------------------------------------------------------------------------------------------------------------------ estimated creatinine clearance is 87.5 mL/min (by C-G formula based on SCr of 0.91 mg/dL). ------------------------------------------------------------------------------------------------------------------ No results for input(s): TSH, T4TOTAL, T3FREE, THYROIDAB in the last 72 hours.  Invalid input(s): FREET3   Coagulation profile No results for input(s): INR, PROTIME in the last 168 hours. ------------------------------------------------------------------------------------------------------------------- No results for input(s): DDIMER in the last 72 hours. -------------------------------------------------------------------------------------------------------------------  Cardiac Enzymes Recent Labs  Lab 05/29/17 1925  TROPONINI <0.03   ------------------------------------------------------------------------------------------------------------------ Invalid input(s): POCBNP  ---------------------------------------------------------------------------------------------------------------  Urinalysis    Component Value Date/Time   COLORURINE Yellow 06/01/2013 1013   APPEARANCEUR Clear 06/01/2013 1013   LABSPEC 1.017 06/01/2013 1013   PHURINE 5.0 06/01/2013 1013   GLUCOSEU Negative 06/01/2013 1013   HGBUR Negative 06/01/2013 1013   BILIRUBINUR Negative 06/01/2013 1013   KETONESUR Negative 06/01/2013 1013   PROTEINUR 100 mg/dL 06/01/2013 1013   NITRITE Negative 06/01/2013 1013  LEUKOCYTESUR Negative 06/01/2013 1013     RADIOLOGY: Dg Chest Port 1 View  Result Date: 05/29/2017 CLINICAL DATA:  Respiratory distress EXAM: PORTABLE CHEST 1 VIEW COMPARISON:  04/26/2017, 04/21/2017 FINDINGS: Hyperinflation with emphysematous disease noted. No acute consolidation or effusion. Stable  cardiomediastinal silhouette. No pneumothorax. IMPRESSION: Hyperinflation with emphysematous disease. No acute pulmonary infiltrate is seen. Electronically Signed   By: Donavan Foil M.D.   On: 05/29/2017 19:53    EKG: Orders placed or performed during the hospital encounter of 04/26/17  . EKG 12-Lead  . EKG 12-Lead  . ED EKG  . ED EKG    IMPRESSION AND PLAN: 1 acute on COPD exacerbation IV Solu-Medrol with tapering as tolerated, inhaled corticosteroids twice daily, mucolytic agents, empiric azithromycin, supplemental oxygen weaning as tolerated  2 chronic tobacco smoking abuse/dependency Nicotine patch for cessation counseling ordered  3 chronic A. fib  Stable Continue Eliquis, digoxin, diltiazem, Lopressor  4 chronic GERD without esophagitis PPI daily  5 chronic benign essential hypertension Stable on current regiment  All the records are reviewed and case discussed with ED provider. Management plans discussed with the patient, family and they are in agreement.  CODE STATUS:full Code Status History    Date Active Date Inactive Code Status Order ID Comments User Context   04/26/2017 07:58 04/29/2017 16:34 Full Code [id number]  Harrie Foreman, MD Inpatient   08/25/2016 06:26 09/01/2016 15:40 Full Code [id number]  Saundra Shelling, MD Inpatient   06/23/2016 18:56 06/26/2016 15:14 Full Code [id number]  Gladstone Lighter, MD Inpatient   06/28/2015 13:09 07/01/2015 19:36 Full Code [id number]  Bettey Costa, MD Inpatient   06/28/2015 11:09 06/28/2015 11:15 DNR [id number]  Bettey Costa, MD ED   09/25/2014 04:34 09/26/2014 14:41 DNR [id number]  Lance Coon, MD Inpatient       TOTAL TIME TAKING CARE OF THIS PATIENT: 45  minutes.    Avel Peace Sarah Zerby M.D on 05/29/2017   Between 7am to 6pm - Pager - [phone]  After 6pm go to [email or web address] - password [email or web address] [place]  Office  [phone]  CC: Primary care physician; Patient, No Pcp Per   Note: This dictation  was prepared with Dragon dictation along with smaller phrase technology. Any transcriptional errors that result from this process are unintentional.

## 2017-05-29 NOTE — ED Provider Notes (Signed)
[facility] Emergency Department Provider Note  ____________________________________________   First MD Initiated Contact with Patient 05/29/17 1856     (approximate)  I have reviewed the triage vital signs and the nursing notes.   HISTORY  Chief Complaint Respiratory Distress  Level 5 exemption history limited by the patient's clinical condition  HPI Ruben [name] is a 62 y.o. male who comes to the emergency department via EMS with respiratory distress.  He has a long-standing history of poorly controlled COPD.  He says he has had gradually progressive insidious onset now severe shortness of breath for the past week or so.  Today at home he gave himself 9 breathing treatments and when that did not help he called 911.  When EMS arrived they noted that he was diaphoretic tripoding barely able to breathe and placed him on BiPAP.  They gave him 125 mg of Solu-Medrol and one breathing treatment with some improvement.  Past Medical History:  Diagnosis Date  . Cancer ([place])    lung cancer  . CHF (congestive heart failure) ([place])   . COPD (chronic obstructive pulmonary disease) ([place])   . Coronary artery disease   . Depression   . Hypertension   . Myocardial infarction ([place])   . Stroke [facility])     Patient Active Problem List   Diagnosis Date Noted  . COPD (chronic obstructive pulmonary disease) ([place]) 05/29/2017  . Acute respiratory failure ([place]) 04/26/2017  . Pneumonia 06/28/2015  . COPD with exacerbation ([place]) 09/25/2014  . Depression 09/25/2014  . COPD exacerbation ([place]) 09/25/2014  . Severe recurrent major depression without psychotic features ([place]) 09/25/2014    Past Surgical History:  Procedure Laterality Date  . CARDIAC CATHETERIZATION    . ELBOW SURGERY Left   . FLEXIBLE BRONCHOSCOPY Bilateral 08/27/2016   Procedure: FLEXIBLE BRONCHOSCOPY;  Surgeon: Allyne Gee, MD;  Location: [facility] ORS;  Service: Pulmonary;  Laterality: Bilateral;  . WRIST  SURGERY Left     Prior to Admission medications   Medication Sig Start Date End Date Taking? Authorizing Provider  digoxin (LANOXIN) 0.25 MG tablet Take 1 tablet (0.25 mg total) by mouth daily. 04/21/17  Yes Lisa Roca, MD  ipratropium-albuterol (DUONEB) 0.5-2.5 (3) MG/3ML SOLN Take 3 mLs by nebulization 4 (four) times daily as needed. For shortness of breath/wheezing. 04/29/17  Yes Demetrios Loll, MD  apixaban (ELIQUIS) 5 MG TABS tablet Take 1 tablet (5 mg total) by mouth 2 (two) times daily. 05/31/17   Max Sane, MD  azithromycin (ZITHROMAX) 500 MG tablet Take 1 tablet (500 mg total) by mouth at bedtime. 05/31/17   Max Sane, MD  clonazePAM (KLONOPIN) 0.5 MG tablet Take 1 tablet (0.5 mg total) by mouth at bedtime as needed (anxiety). 05/31/17   Max Sane, MD  diltiazem (CARDIZEM CD) 120 MG 24 hr capsule Take 1 capsule (120 mg total) by mouth daily. 05/31/17   Max Sane, MD  escitalopram (LEXAPRO) 20 MG tablet Take 1 tablet (20 mg total) by mouth every morning. 05/31/17   Max Sane, MD  guaiFENesin (MUCINEX) 600 MG 12 hr tablet Take 1 tablet (600 mg total) by mouth 2 (two) times daily. 05/31/17   Max Sane, MD  ipratropium-albuterol (DUONEB) 0.5-2.5 (3) MG/3ML SOLN Take 3 mLs by nebulization every 6 (six) hours. 05/31/17   Max Sane, MD  metoprolol tartrate (LOPRESSOR) 25 MG tablet Take 1 tablet (25 mg total) by mouth daily. 05/31/17   Max Sane, MD  mirtazapine (REMERON) 15 MG tablet Take 1  tablet (15 mg total) by mouth at bedtime. 05/31/17   Max Sane, MD  mometasone-formoterol (DULERA) 200-5 MCG/ACT AERO Inhale 2 puffs into the lungs 2 (two) times daily. 05/31/17   Max Sane, MD  nicotine (NICODERM CQ - DOSED IN MG/24 HOURS) 14 mg/24hr patch Place 1 patch (14 mg total) onto the skin daily. 05/31/17   Max Sane, MD  pantoprazole (PROTONIX) 40 MG tablet Take 1 tablet (40 mg total) by mouth daily. 05/31/17   Max Sane, MD  simvastatin (ZOCOR) 20 MG tablet Take 1 tablet (20 mg total) by  mouth every morning. 05/31/17   Max Sane, MD  traZODone (DESYREL) 150 MG tablet Take 1 tablet (150 mg total) by mouth at bedtime. 05/31/17   Max Sane, MD    Allergies Patient has no known allergies.  Family History  Problem Relation Age of Onset  . Heart disease Unknown   . Hypertension Unknown   . Diabetes Unknown   . CAD Mother     Social History Social History   Tobacco Use  . Smoking status: Former Smoker    Packs/day: 1.00    Years: 43.00    Pack years: 43.00    Types: Cigarettes  . Smokeless tobacco: Never Used  Substance Use Topics  . Alcohol use: No  . Drug use: Yes    Frequency: 4.0 times per week    Types: Marijuana    Review of Systems Level 5 exemption history limited by the patient's clinical condition ____________________________________________   PHYSICAL EXAM:  VITAL SIGNS: ED Triage Vitals  Enc Vitals Group     BP      Pulse      Resp      Temp      Temp src      SpO2      Weight      Height      Head Circumference      Peak Flow      Pain Score      Pain Loc      Pain Edu?      Excl. in [place]?     Constitutional: Moderate respiratory distress on BiPAP speaks in 2 word gasps Eyes: PERRL EOMI. Head: Atraumatic. Nose: No congestion/rhinnorhea. Mouth/Throat: No trismus Neck: No stridor.   Cardiovascular: Tachycardic rate, regular rhythm. Grossly normal heart sounds.  Good peripheral circulation. Respiratory: Increased respiratory effort on BiPAP with diffuse wheezing in all fields Gastrointestinal: Soft nontender Musculoskeletal: No lower extremity edema   Neurologic:  . No gross focal neurologic deficits are appreciated. Skin: Diaphoretic. Psychiatric: Anxious appearing   ____________________________________________   DIFFERENTIAL includes but not limited to  COPD exacerbation, pulmonary embolism, pneumonia, pneumothorax, influenza ____________________________________________   LABS (all labs ordered are listed, but only  abnormal results are displayed)  Labs Reviewed  COMPREHENSIVE METABOLIC PANEL - Abnormal; Notable for the following components:      Result Value   ALT 8 (*)    All other components within normal limits  CBC WITH DIFFERENTIAL/PLATELET - Abnormal; Notable for the following components:   RDW 14.8 (*)    Neutro Abs 6.6 (*)    All other components within normal limits  TROPONIN I  INFLUENZA PANEL BY PCR (TYPE A & B)    Lab work reviewed by me with no acute disease __________________________________________  EKG   ____________________________________________  RADIOLOGY  Chest x-ray reviewed by me with no acute disease ____________________________________________   PROCEDURES  Procedure(s) performed: no  .Critical Care Performed by:  Darel Hong, MD Authorized by: Darel Hong, MD   Critical care provider statement:    Critical care time (minutes):  30   Critical care time was exclusive of:  Separately billable procedures and treating other patients   Critical care was necessary to treat or prevent imminent or life-threatening deterioration of the following conditions:  Respiratory failure   Critical care was time spent personally by me on the following activities:  Development of treatment plan with patient or surrogate, discussions with consultants, evaluation of patient's response to treatment, examination of patient, obtaining history from patient or surrogate, ordering and performing treatments and interventions, ordering and review of laboratory studies, ordering and review of radiographic studies, pulse oximetry, re-evaluation of patient's condition and review of old charts    Critical Care performed: yes  Observation: no ____________________________________________   INITIAL IMPRESSION / ASSESSMENT AND PLAN / ED COURSE  Pertinent labs & imaging results that were available during my care of the patient were reviewed by me and considered in my medical  decision making (see chart for details).  Patient arrives critically ill with elevated respiratory rate requiring BiPAP and diaphoretic and quite short of breath.  We will continue more breathing treatments along with azithromycin and magnesium and reevaluate the patient.  Suspect he is clinically dehydrated given his elevated respiratory rate for some time so a liter of fluids as well.  After more breathing treatments and more time on BiPAP the patient feels somewhat improved.  Have taken him off BiPAP and he is able to tolerate although is quickly out of breath when attempting to ambulate even short distances and hypoxic to 88% on room air.  At this point the patient requires inpatient admission for further beta agonist and steroids and oxygen supplementation.  I discussed with the hospitalist Dr. Jerelyn Charles who is graciously agreed to admit the patient to his service and I then discussed with the patient who agrees with the plan.      ____________________________________________   FINAL CLINICAL IMPRESSION(S) / ED DIAGNOSES  Final diagnoses:  COPD exacerbation ([place])  Respiratory distress      NEW MEDICATIONS STARTED DURING THIS VISIT:  Discharge Medication List as of 05/31/2017  7:43 AM    START taking these medications   Details  azithromycin (ZITHROMAX) 500 MG tablet Take 1 tablet (500 mg total) by mouth at bedtime., Starting Tue 05/31/2017, Normal    guaiFENesin (MUCINEX) 600 MG 12 hr tablet Take 1 tablet (600 mg total) by mouth 2 (two) times daily., Starting Tue 05/31/2017, Normal    !! ipratropium-albuterol (DUONEB) 0.5-2.5 (3) MG/3ML SOLN Take 3 mLs by nebulization every 6 (six) hours., Starting Tue 05/31/2017, Normal    mometasone-formoterol (DULERA) 200-5 MCG/ACT AERO Inhale 2 puffs into the lungs 2 (two) times daily., Starting Tue 05/31/2017, Normal    nicotine (NICODERM CQ - DOSED IN MG/24 HOURS) 14 mg/24hr patch Place 1 patch (14 mg total) onto the skin daily., Starting Tue  05/31/2017, Normal    pantoprazole (PROTONIX) 40 MG tablet Take 1 tablet (40 mg total) by mouth daily., Starting Tue 05/31/2017, Normal     !! - Potential duplicate medications found. Please discuss with provider.       Note:  This document was prepared using Dragon voice recognition software and may include unintentional dictation errors.     Darel Hong, MD 05/31/17 2217

## 2017-05-30 ENCOUNTER — Inpatient Hospital Stay: Payer: Medicaid Other

## 2017-05-30 MED ORDER — IOPAMIDOL (ISOVUE-370) INJECTION 76%
75.0000 mL | Freq: Once | INTRAVENOUS | Status: AC | PRN
  Administered 2017-05-30: 75 mL via INTRAVENOUS

## 2017-05-30 MED ORDER — DILTIAZEM HCL ER COATED BEADS 120 MG PO CP24
120.0000 mg | ORAL_CAPSULE | Freq: Every day | ORAL | Status: DC
  Administered 2017-05-30 – 2017-05-31 (×2): 120 mg via ORAL
  Filled 2017-05-30 (×2): qty 1

## 2017-05-30 MED ORDER — AZITHROMYCIN 500 MG PO TABS
500.0000 mg | ORAL_TABLET | Freq: Every day | ORAL | Status: DC
Start: 2017-05-30 — End: 2017-05-31
  Administered 2017-05-30: 21:00:00 500 mg via ORAL
  Filled 2017-05-30: qty 1

## 2017-05-30 MED ORDER — METOPROLOL TARTRATE 25 MG PO TABS
25.0000 mg | ORAL_TABLET | Freq: Every day | ORAL | Status: DC
  Administered 2017-05-30 – 2017-05-31 (×2): 25 mg via ORAL
  Filled 2017-05-30 (×2): qty 1

## 2017-05-30 NOTE — Progress Notes (Addendum)
[facility] [place] NAME: Ruben [name]    MR#:  [id number]  DATE OF BIRTH:  1955/07/08  SUBJECTIVE:  CHIEF COMPLAINT:   Chief Complaint  Patient presents with  . Respiratory Distress  feels about same. Hasn't taken meds as no PCP (since Dr Brynda Greathouse retired, hasn't found PCP) REVIEW OF SYSTEMS:  Review of Systems  Constitutional: Negative for chills, fever and weight loss.  HENT: Negative for nosebleeds and sore throat.   Eyes: Negative for blurred vision.  Respiratory: Positive for cough and shortness of breath. Negative for wheezing.   Cardiovascular: Negative for chest pain, orthopnea, leg swelling and PND.  Gastrointestinal: Negative for abdominal pain, constipation, diarrhea, heartburn, nausea and vomiting.  Genitourinary: Negative for dysuria and urgency.  Musculoskeletal: Negative for back pain.  Skin: Negative for rash.  Neurological: Negative for dizziness, speech change, focal weakness and headaches.  Endo/Heme/Allergies: Does not bruise/bleed easily.  Psychiatric/Behavioral: Negative for depression.    DRUG ALLERGIES:  No Known Allergies VITALS:  Blood pressure 126/62, pulse (!) 50, temperature 97.6 F (36.4 C), temperature source Oral, resp. rate 16, height 5\' 8"  (1.727 m), weight 68.7 kg (151 lb 6.4 oz), SpO2 93 %. PHYSICAL EXAMINATION:  Physical Exam  Constitutional: He is oriented to person, place, and time and well-developed, well-nourished, and in no distress.  HENT:  Head: Normocephalic and atraumatic.  Eyes: Conjunctivae and EOM are normal. Pupils are equal, round, and reactive to light.  Neck: Normal range of motion. Neck supple. No tracheal deviation present. No thyromegaly present.  Cardiovascular: Normal rate, regular rhythm and normal heart sounds.  Pulmonary/Chest: Effort normal and breath sounds normal. No respiratory distress. He has no wheezes. He exhibits no tenderness.  Abdominal: Soft. Bowel sounds  are normal. He exhibits no distension. There is no tenderness.  Musculoskeletal: Normal range of motion.  Neurological: He is alert and oriented to person, place, and time. No cranial nerve deficit.  Skin: Skin is warm and dry. No rash noted.  Psychiatric: Mood and affect normal.   LABORATORY PANEL:  Male CBC Recent Labs  Lab 05/29/17 1925  WBC 9.6  HGB 14.7  HCT 44.6  PLT 301   ------------------------------------------------------------------------------------------------------------------ Chemistries  Recent Labs  Lab 05/29/17 1925  NA 137  K 4.7  CL 101  CO2 23  GLUCOSE 92  BUN 8  CREATININE 0.91  CALCIUM 9.1  AST 15  ALT 8*  ALKPHOS 59  BILITOT 0.7   RADIOLOGY:  Dg Chest Port 1 View  Result Date: 05/29/2017 CLINICAL DATA:  Respiratory distress EXAM: PORTABLE CHEST 1 VIEW COMPARISON:  04/26/2017, 04/21/2017 FINDINGS: Hyperinflation with emphysematous disease noted. No acute consolidation or effusion. Stable cardiomediastinal silhouette. No pneumothorax. IMPRESSION: Hyperinflation with emphysematous disease. No acute pulmonary infiltrate is seen. Electronically Signed   By: Donavan Foil M.D.   On: 05/29/2017 19:53   ASSESSMENT AND PLAN:  1 acute COPD exacerbation - continue IV Solu-Medrol with tapering as tolerated, inhaled corticosteroids twice daily, mucolytic agents, empiric azithromycin, supplemental oxygen weaning as tolerated  * H/o lung mass: possible cancer - patient chose not to get evaluated.  - he continues to lose weight. Unable to quantify - Will get CT angio to eval for PE and underlying mass  2 Chronic A. fib - rate low - stop digoxin and cut back on metoprolol (50 mg bid to 25 mg once daily) and cardizem (from 360 mg to 120 mg once daily) Continue Eliquis  3 chronic GERD  without esophagitis PPI daily  4 chronic benign essential hypertension - well controlled. - cutting back on metoprolol and cardizem  5. chronic tobacco smoking  abuse/dependency Nicotine patch for cessation counseling ordered   Patient was set up to see [facility] on 1/28 per CM but he never followed up  All the records are reviewed and case discussed with Care Management/Social Worker. Management plans discussed with the patient, nursing and they are in agreement.  CODE STATUS: Full Code  TOTAL TIME TAKING CARE OF THIS PATIENT: 25 minutes.   More than 50% of the time was spent in counseling/coordination of care: YES  POSSIBLE D/C IN 1-2 DAYS, DEPENDING ON CLINICAL CONDITION.   Max Sane M.D on 05/30/2017 at 8:11 AM  Between 7am to 6pm - Pager - [phone]  After 6pm go to [email or web address] - Proofreader  [facility]  Office  [phone]  CC: Primary care physician; Patient, No Pcp Per  Note: This dictation was prepared with Dragon dictation along with smaller phrase technology. Any transcriptional errors that result from this process are unintentional.

## 2017-05-30 NOTE — Progress Notes (Signed)
PHARMACIST - PHYSICIAN COMMUNICATION DR:   Manuella Ghazi CONCERNING: Antibiotic IV to Oral Route Change Policy  RECOMMENDATION: This patient is receiving azithromycin by the intravenous route.  Based on criteria approved by the Pharmacy and Therapeutics Committee, the antibiotic(s) is/are being converted to the equivalent oral dose form(s).   DESCRIPTION: These criteria include:  Patient being treated for a respiratory tract infection, urinary tract infection, cellulitis or clostridium difficile associated diarrhea if on metronidazole  The patient is not neutropenic and does not exhibit a GI malabsorption state  The patient is eating (either orally or via tube) and/or has been taking other orally administered medications for a least 24 hours  The patient is improving clinically and has a Tmax < 100.5  If you have questions about this conversion, please contact the Pharmacy Department  []   [phone] )  [facility] [x]   [phone] )  [facility] []   [phone] )  [facility] []   [phone] )  [facility] []   [phone] )  [facility]

## 2017-05-30 NOTE — Care Management Note (Signed)
Case Management Note  Patient Details  Name: Ruben [name] MRN: [id number] Date of Birth: 14-Aug-1955  Subjective/Objective:   Admitted to [facility] with the diagnosis of acute encephopathy. Lives with his cousin.Marland Kitchen Ex-wife is Lorelle Gibbs [phone]). Last seen Dr Brynda Greathouse in the past. Prescriptions are filled at [facility].  [facility] 07/01/15. No skilled facility. No home oxygen. Nebulizer in the home. Takes care of all basic activities of daily living himself, could drive if he had a car. [place] last month. Good appetite. Possibly a friend can transport home.  States he gets $700.00 a month.                  Action/Plan: Discussed that an appointment had been arranged at [facility] for 05/09/17 last admission. States he did not keep that appointment because of no transportation issues and couldn't pay the co-pay. Please arrange an appointment at [facility] or [facility] for the 1st of the month. These offices are close to where he lives.    Expected Discharge Date:                  Expected Discharge Plan:     In-House Referral:   yes  Discharge planning Services   yes  Post Acute Care Choice:    Choice offered to:     DME Arranged:    DME Agency:     HH Arranged:    HH Agency:     Status of Service:     If discussed at [employer] of Stay Meetings, dates discussed:    Additional Comments:  Shelbie Ammons, RN MSN CCM Care Management [phone] 05/30/2017, 1:11 PM

## 2017-05-30 NOTE — Progress Notes (Addendum)
Family Meeting Note  Advance Directive:yes  Today a meeting took place with the Patient.  The following clinical team members were present during this meeting:MD  The following were discussed:Patient's diagnosis:   * Acute COPD exacerbation * Lung mass: possible cancer, patient chose not to get treated or get any further work up * Chronic A. fib  *chronic GERD without esophagitis *chronic benign essential hypertension *chronic tobacco smoking abuse/dependency    Patient's progosis: > 12 months and Goals for treatment: DNR  Additional follow-up to be provided: Palliative care eval - Home with Hospice (He's been losing weight and chose not to have work up for possible lung cancer, non-compliant with meds also, multiple readmissions)  Time spent during discussion:20 minutes  Max Sane, MD

## 2017-05-31 MED ORDER — METOPROLOL TARTRATE 25 MG PO TABS: 25.0000 mg | ORAL_TABLET | Freq: Every day | ORAL | 0 refills | Status: DC

## 2017-05-31 MED ORDER — AZITHROMYCIN 500 MG PO TABS: 500.0000 mg | ORAL_TABLET | Freq: Every day | ORAL | 0 refills | Status: DC

## 2017-05-31 MED ORDER — MIRTAZAPINE 15 MG PO TABS: 15.0000 mg | ORAL_TABLET | Freq: Every day | ORAL | 0 refills | Status: DC

## 2017-05-31 MED ORDER — APIXABAN 5 MG PO TABS: 5.0000 mg | ORAL_TABLET | Freq: Two times a day (BID) | ORAL | 0 refills | Status: DC

## 2017-05-31 MED ORDER — SIMVASTATIN 20 MG PO TABS: 20.0000 mg | ORAL_TABLET | ORAL | 0 refills | Status: DC

## 2017-05-31 MED ORDER — MOMETASONE FURO-FORMOTEROL FUM 200-5 MCG/ACT IN AERO: 2.0000 | INHALATION_SPRAY | Freq: Two times a day (BID) | RESPIRATORY_TRACT | 1 refills | Status: DC

## 2017-05-31 MED ORDER — GUAIFENESIN ER 600 MG PO TB12: 600.0000 mg | ORAL_TABLET | Freq: Two times a day (BID) | ORAL | 0 refills | Status: DC

## 2017-05-31 MED ORDER — ESCITALOPRAM OXALATE 20 MG PO TABS: 20.0000 mg | ORAL_TABLET | ORAL | 0 refills | Status: DC

## 2017-05-31 MED ORDER — TRAZODONE HCL 150 MG PO TABS: 150.0000 mg | ORAL_TABLET | Freq: Every day | ORAL | 0 refills | Status: DC

## 2017-05-31 MED ORDER — PANTOPRAZOLE SODIUM 40 MG PO TBEC: 40.0000 mg | DELAYED_RELEASE_TABLET | Freq: Every day | ORAL | 0 refills | Status: DC

## 2017-05-31 MED ORDER — NICOTINE 14 MG/24HR TD PT24: 14.0000 mg | MEDICATED_PATCH | Freq: Every day | TRANSDERMAL | 0 refills | Status: DC

## 2017-05-31 MED ORDER — CLONAZEPAM 0.5 MG PO TABS: 0.5000 mg | ORAL_TABLET | Freq: Every evening | ORAL | 0 refills | Status: DC | PRN

## 2017-05-31 MED ORDER — IPRATROPIUM-ALBUTEROL 0.5-2.5 (3) MG/3ML IN SOLN: 3.0000 mL | Freq: Four times a day (QID) | RESPIRATORY_TRACT | 0 refills | Status: DC

## 2017-05-31 MED ORDER — DILTIAZEM HCL ER COATED BEADS 120 MG PO CP24: 120.0000 mg | ORAL_CAPSULE | Freq: Every day | ORAL | 0 refills | Status: DC

## 2017-05-31 NOTE — Discharge Instructions (Signed)
Chronic Obstructive Pulmonary Disease Chronic obstructive pulmonary disease (COPD) is a long-term (chronic) lung problem. When you have COPD, it is hard for air to get in and out of your lungs. The way your lungs work will never return to normal. Usually the condition gets worse over time. There are things you can do to keep yourself as healthy as possible. Your doctor may treat your condition with:  Medicines.  Quitting smoking, if you smoke.  Rehabilitation. This may involve a team of specialists.  Oxygen.  Exercise and changes to your diet.  Lung surgery.  Comfort measures (palliative care).  Follow these instructions at home: Medicines  Take over-the-counter and prescription medicines only as told by your doctor.  Talk to your doctor before taking any cough or allergy medicines. You may need to avoid medicines that cause your lungs to be dry. Lifestyle  If you smoke, stop. Smoking makes the problem worse. If you need help quitting, ask your doctor.  Avoid being around things that make your breathing worse. This may include smoke, chemicals, and fumes.  Stay active, but remember to also rest.  Learn and use tips on how to relax.  Make sure you get enough sleep. Most adults need at least 7 hours a night.  Eat healthy foods. Eat smaller meals more often. Rest before meals. Controlled breathing  Learn and use tips on how to control your breathing as told by your doctor. Try: ? Breathing in (inhaling) through your nose for 1 second. Then, pucker your lips and breath out (exhale) through your lips for 2 seconds. ? Putting one hand on your belly (abdomen). Breathe in slowly through your nose for 1 second. Your hand on your belly should move out. Pucker your lips and breathe out slowly through your lips. Your hand on your belly should move in as you breathe out. Controlled coughing  Learn and use controlled coughing to clear mucus from your lungs. The steps are: 1. Lean your  head a little forward. 2. Breathe in deeply. 3. Try to hold your breath for 3 seconds. 4. Keep your mouth slightly open while coughing 2 times. 5. Spit any mucus out into a tissue. 6. Rest and do the steps again 1 or 2 times as needed. General instructions  Make sure you get all the shots (vaccines) that your doctor recommends. Ask your doctor about a flu shot and a pneumonia shot.  Use oxygen therapy and therapy to help improve your lungs (pulmonary rehabilitation) if told by your doctor. If you need home oxygen therapy, ask your doctor if you should buy a tool to measure your oxygen level (oximeter).  Make a COPD action plan with your doctor. This helps you know what to do if you feel worse than usual.  Manage any other conditions you have as told by your doctor.  Avoid going outside when it is very hot, cold, or humid.  Avoid people who have a sickness you can catch (contagious).  Keep all follow-up visits as told by your doctor. This is important. Contact a doctor if:  You cough up more mucus than usual.  There is a change in the color or thickness of the mucus.  It is harder to breathe than usual.  Your breathing is faster than usual.  You have trouble sleeping.  You need to use your medicines more often than usual.  You have trouble doing your normal activities such as getting dressed or walking around the house. Get help right away if:    You have shortness of breath while resting.  You have shortness of breath that stops you from: ? Being able to talk. ? Doing normal activities.  Your chest hurts for longer than 5 minutes.  Your skin color is more blue than usual.  Your pulse oximeter shows that you have low oxygen for longer than 5 minutes.  You have a fever.  You feel too tired to breathe normally. Summary  Chronic obstructive pulmonary disease (COPD) is a long-term lung problem.  The way your lungs work will never return to normal. Usually the  condition gets worse over time. There are things you can do to keep yourself as healthy as possible.  Take over-the-counter and prescription medicines only as told by your doctor.  If you smoke, stop. Smoking makes the problem worse. This information is not intended to replace advice given to you by your health care provider. Make sure you discuss any questions you have with your health care provider. Document Released: 09/15/2007 Document Revised: 09/04/2015 Document Reviewed: 11/23/2012 Elsevier Interactive Patient Education  2017 [place] on my medicine - ELIQUIS (apixaban)  This medication education was reviewed with me or my healthcare representative as part of my discharge preparation.  The pharmacist that spoke with me during my hospital stay was:  Ramond Dial, [facility]  Why was Eliquis prescribed for you? Eliquis was prescribed for you to reduce the risk of a blood clot forming that can cause a stroke if you have a medical condition called atrial fibrillation (a type of irregular heartbeat).  What do You need to know about Eliquis ? Take your Eliquis TWICE DAILY - one tablet in the morning and one tablet in the evening with or without food. If you have difficulty swallowing the tablet whole please discuss with your pharmacist how to take the medication safely.  Take Eliquis exactly as prescribed by your doctor and DO NOT stop taking Eliquis without talking to the doctor who prescribed the medication.  Stopping may increase your risk of developing a stroke.  Refill your prescription before you run out.  After discharge, you should have regular check-up appointments with your healthcare provider that is prescribing your Eliquis.  In the future your dose may need to be changed if your kidney function or weight changes by a significant amount or as you get older.  What do you do if you miss a dose? If you miss a dose, take it as soon as you remember on the same  day and resume taking twice daily.  Do not take more than one dose of ELIQUIS at the same time to make up a missed dose.  Important Safety Information A possible side effect of Eliquis is bleeding. You should call your healthcare provider right away if you experience any of the following: ? Bleeding from an injury or your nose that does not stop. ? Unusual colored urine (red or dark brown) or unusual colored stools (red or black). ? Unusual bruising for unknown reasons. ? A serious fall or if you hit your head (even if there is no bleeding).  Some medicines may interact with Eliquis and might increase your risk of bleeding or clotting while on Eliquis. To help avoid this, consult your healthcare provider or pharmacist prior to using any new prescription or non-prescription medications, including herbals, vitamins, non-steroidal anti-inflammatory drugs (NSAIDs) and supplements.  This website has more information on Eliquis (apixaban): http://www.eliquis.com/eliquis/home

## 2017-05-31 NOTE — Progress Notes (Signed)
MD making rounds. Order received to discharge home. IV removed. Prescriptions given to patient. Additional prescriptions E-scribed to pharmacy. Discharge paperwork provided, explained, signed and witnessed. No unanswered questions. Discharged via wheelchair with volunteer services. Belongings sent with patient.

## 2017-06-06 NOTE — Discharge Summary (Signed)
[facility] [place] NAME: Ruben [name]    MR#:  [id number]  DATE OF BIRTH:  [birth year]  DATE OF ADMISSION:  05/29/2017   ADMITTING PHYSICIAN: Gorden Harms, MD  DATE OF DISCHARGE: 05/31/2017 10:37 AM  PRIMARY CARE PHYSICIAN: Patient, No Pcp Per   ADMISSION DIAGNOSIS:  COPD exacerbation ([place]) [J44.1] DISCHARGE DIAGNOSIS:  Active Problems:   COPD (chronic obstructive pulmonary disease) ([place])  SECONDARY DIAGNOSIS:   Past Medical History:  Diagnosis Date  . Cancer ([place])    lung cancer  . CHF (congestive heart failure) ([place])   . COPD (chronic obstructive pulmonary disease) ([place])   . Coronary artery disease   . Depression   . Hypertension   . Myocardial infarction ([place])   . Stroke [facility])    HOSPITAL COURSE:  1acute COPD exacerbation - improved with steroids, Nebs  * H/o lung mass:  - CT angio this admission not showing any mass.  2 Chronic A. fib - rate low - cardizem, digoxin and metoprolol for rate control - Continue Eliquis for anticoagulation  3chronic GERD without esophagitis PPI daily  4chronic benign essential hypertension - well controlled.  5.chronic tobacco smoking abuse/dependency Nicotine patch for cessation counseling ordered DISCHARGE CONDITIONS:  stable CONSULTS OBTAINED:   DRUG ALLERGIES:  No Known Allergies DISCHARGE MEDICATIONS:   Allergies as of 05/31/2017   No Known Allergies     Medication List    STOP taking these medications   ADVAIR DISKUS 250-50 MCG/DOSE Aepb Generic drug:  Fluticasone-Salmeterol Replaced by:  mometasone-formoterol 200-5 MCG/ACT Aero   albuterol 108 (90 Base) MCG/ACT inhaler Commonly known as:  PROVENTIL HFA;VENTOLIN HFA Replaced by:  ipratropium-albuterol 0.5-2.5 (3) MG/3ML Soln   omeprazole 40 MG capsule Commonly known as:  PRILOSEC Replaced by:  pantoprazole 40 MG tablet     TAKE these medications   apixaban 5 MG Tabs tablet Commonly known  as:  ELIQUIS Take 1 tablet (5 mg total) by mouth 2 (two) times daily.   azithromycin 500 MG tablet Commonly known as:  ZITHROMAX Take 1 tablet (500 mg total) by mouth at bedtime.   clonazePAM 0.5 MG tablet Commonly known as:  KLONOPIN Take 1 tablet (0.5 mg total) by mouth at bedtime as needed (anxiety). What changed:    when to take this  reasons to take this  additional instructions   digoxin 0.25 MG tablet Commonly known as:  LANOXIN Take 1 tablet (0.25 mg total) by mouth daily.   diltiazem 120 MG 24 hr capsule Commonly known as:  CARDIZEM CD Take 1 capsule (120 mg total) by mouth daily. What changed:    medication strength  how much to take   escitalopram 20 MG tablet Commonly known as:  LEXAPRO Take 1 tablet (20 mg total) by mouth every morning.   guaiFENesin 600 MG 12 hr tablet Commonly known as:  MUCINEX Take 1 tablet (600 mg total) by mouth 2 (two) times daily.   ipratropium-albuterol 0.5-2.5 (3) MG/3ML Soln Commonly known as:  DUONEB Take 3 mLs by nebulization 4 (four) times daily as needed. For shortness of breath/wheezing. What changed:  Another medication with the same name was added. Make sure you understand how and when to take each.   ipratropium-albuterol 0.5-2.5 (3) MG/3ML Soln Commonly known as:  DUONEB Take 3 mLs by nebulization every 6 (six) hours. What changed:  You were already taking a medication with the same name, and this prescription was added. Make sure you understand  how and when to take each. Replaces:  albuterol 108 (90 Base) MCG/ACT inhaler   metoprolol tartrate 25 MG tablet Commonly known as:  LOPRESSOR Take 1 tablet (25 mg total) by mouth daily. What changed:    medication strength  how much to take  when to take this   mirtazapine 15 MG tablet Commonly known as:  REMERON Take 1 tablet (15 mg total) by mouth at bedtime.   mometasone-formoterol 200-5 MCG/ACT Aero Commonly known as:  DULERA Inhale 2 puffs into the lungs  2 (two) times daily. Replaces:  ADVAIR DISKUS 250-50 MCG/DOSE Aepb   nicotine 14 mg/24hr patch Commonly known as:  NICODERM CQ - dosed in mg/24 hours Place 1 patch (14 mg total) onto the skin daily.   pantoprazole 40 MG tablet Commonly known as:  PROTONIX Take 1 tablet (40 mg total) by mouth daily. Replaces:  omeprazole 40 MG capsule   simvastatin 20 MG tablet Commonly known as:  ZOCOR Take 1 tablet (20 mg total) by mouth every morning.   traZODone 150 MG tablet Commonly known as:  DESYREL Take 1 tablet (150 mg total) by mouth at bedtime.        DISCHARGE INSTRUCTIONS:   DIET:  Cardiac diet DISCHARGE CONDITION:  Good ACTIVITY:  Activity as tolerated OXYGEN:  Home Oxygen: No.  Oxygen Delivery: room air DISCHARGE LOCATION:  home   If you experience worsening of your admission symptoms, develop shortness of breath, life threatening emergency, suicidal or homicidal thoughts you must seek medical attention immediately by calling 911 or calling your MD immediately  if symptoms less severe.  You Must read complete instructions/literature along with all the possible adverse reactions/side effects for all the Medicines you take and that have been prescribed to you. Take any new Medicines after you have completely understood and accpet all the possible adverse reactions/side effects.   Please note  You were cared for by a hospitalist during your hospital stay. If you have any questions about your discharge medications or the care you received while you were in the hospital after you are discharged, you can call the unit and asked to speak with the hospitalist on call if the hospitalist that took care of you is not available. Once you are discharged, your primary care physician will handle any further medical issues. Please note that NO REFILLS for any discharge medications will be authorized once you are discharged, as it is imperative that you return to your primary care physician  (or establish a relationship with a primary care physician if you do not have one) for your aftercare needs so that they can reassess your need for medications and monitor your lab values.    On the day of Discharge:  VITAL SIGNS:  Blood pressure 134/66, pulse 93, temperature 98.6 F (37 C), temperature source Oral, resp. rate 18, height 5\' 8"  (1.727 m), weight 68.7 kg (151 lb 6.4 oz), SpO2 94 %. PHYSICAL EXAMINATION:  GENERAL:  62 y.o.-year-old patient lying in the bed with no acute distress.  EYES: Pupils equal, round, reactive to light and accommodation. No scleral icterus. Extraocular muscles intact.  HEENT: Head atraumatic, normocephalic. Oropharynx and nasopharynx clear.  NECK:  Supple, no jugular venous distention. No thyroid enlargement, no tenderness.  LUNGS: Normal breath sounds bilaterally, no wheezing, rales,rhonchi or crepitation. No use of accessory muscles of respiration.  CARDIOVASCULAR: S1, S2 normal. No murmurs, rubs, or gallops.  ABDOMEN: Soft, non-tender, non-distended. Bowel sounds present. No organomegaly or mass.  EXTREMITIES: No  pedal edema, cyanosis, or clubbing.  NEUROLOGIC: Cranial nerves II through XII are intact. Muscle strength 5/5 in all extremities. Sensation intact. Gait not checked.  PSYCHIATRIC: The patient is alert and oriented x 3.  SKIN: No obvious rash, lesion, or ulcer.  DATA REVIEW:   Follow-up Information    [facility] Go in 1 week(s).   Specialty:  Primary Care Contact information: [address] [place], [place], [place]. Go on 07/12/2017.   Specialty:  Physician Assistant Why:  @8 :40 AM -- PLEASE BRING PROOF OF ADDRESS<I.D.<MEDICAID CARD Contact information: [place] [address] [phone]           Microbiology Results    Management plans discussed with the patient, family and they are in agreement.  CODE  STATUS: Prior   TOTAL TIME TAKING CARE OF THIS PATIENT: 45 minutes.    Max Sane M.D on 06/06/2017 at 10:29 AM  Between 7am to 6pm - Pager - [phone]  After 6pm go to [email or web address] - Proofreader  [facility]  Office  [phone]  CC: Primary care physician; Patient, No Pcp Per   Note: This dictation was prepared with Dragon dictation along with smaller phrase technology. Any transcriptional errors that result from this process are unintentional.

## 2017-06-07 ENCOUNTER — Telehealth: Payer: Self-pay | Admitting: Professional Counselor

## 2017-06-07 NOTE — Telephone Encounter (Signed)
Call placed to patient to complete and address concerns with coping/questions. No answer. Lane Hacker, MSW Clinical Social Work: [employer] Coverage for :  [phone]

## 2017-06-08 ENCOUNTER — Telehealth: Payer: Self-pay | Admitting: Licensed Clinical Social Worker

## 2017-06-08 NOTE — Telephone Encounter (Signed)
Clinical Education officer, museum (CSW) attempted second call via telephone to patient due to patient answering loss of interest on emmi call. Patient did not answer and a voicemail was not able to be left.   [employer], LCSW [phone]

## 2017-06-09 ENCOUNTER — Encounter: Payer: Self-pay | Admitting: Licensed Clinical Social Worker

## 2018-02-16 ENCOUNTER — Other Ambulatory Visit: Payer: Self-pay

## 2018-02-16 ENCOUNTER — Encounter: Payer: Self-pay | Admitting: *Deleted

## 2018-02-16 ENCOUNTER — Inpatient Hospital Stay
Admission: EM | Admit: 2018-02-16 | Discharge: 2018-02-19 | DRG: 192 | Disposition: A | Discharge status: Home / Self Care | Payer: Medicaid Other | Attending: Internal Medicine | Admitting: Internal Medicine

## 2018-02-16 ENCOUNTER — Emergency Department: Payer: Medicaid Other

## 2018-02-16 DIAGNOSIS — Z7951 Long term (current) use of inhaled steroids: Secondary | ICD-10-CM

## 2018-02-16 DIAGNOSIS — Z8249 Family history of ischemic heart disease and other diseases of the circulatory system: Secondary | ICD-10-CM

## 2018-02-16 DIAGNOSIS — I2510 Atherosclerotic heart disease of native coronary artery without angina pectoris: Secondary | ICD-10-CM | POA: Diagnosis present

## 2018-02-16 DIAGNOSIS — Z8673 Personal history of transient ischemic attack (TIA), and cerebral infarction without residual deficits: Secondary | ICD-10-CM

## 2018-02-16 DIAGNOSIS — Z7901 Long term (current) use of anticoagulants: Secondary | ICD-10-CM

## 2018-02-16 DIAGNOSIS — F419 Anxiety disorder, unspecified: Secondary | ICD-10-CM | POA: Diagnosis present

## 2018-02-16 DIAGNOSIS — I252 Old myocardial infarction: Secondary | ICD-10-CM

## 2018-02-16 DIAGNOSIS — Z23 Encounter for immunization: Secondary | ICD-10-CM

## 2018-02-16 DIAGNOSIS — I110 Hypertensive heart disease with heart failure: Secondary | ICD-10-CM | POA: Diagnosis present

## 2018-02-16 DIAGNOSIS — Z79899 Other long term (current) drug therapy: Secondary | ICD-10-CM

## 2018-02-16 DIAGNOSIS — Z85118 Personal history of other malignant neoplasm of bronchus and lung: Secondary | ICD-10-CM

## 2018-02-16 DIAGNOSIS — F17210 Nicotine dependence, cigarettes, uncomplicated: Secondary | ICD-10-CM | POA: Diagnosis present

## 2018-02-16 DIAGNOSIS — J441 Chronic obstructive pulmonary disease with (acute) exacerbation: Principal | ICD-10-CM | POA: Diagnosis present

## 2018-02-16 DIAGNOSIS — I509 Heart failure, unspecified: Secondary | ICD-10-CM | POA: Diagnosis present

## 2018-02-16 LAB — CBC WITH DIFFERENTIAL/PLATELET
ABS IMMATURE GRANULOCYTES: 0.03 10*3/uL (ref 0.00–0.07)
BASOS ABS: 0.1 10*3/uL (ref 0.0–0.1)
BASOS PCT: 1 %
EOS PCT: 1 %
Eosinophils Absolute: 0.1 10*3/uL (ref 0.0–0.5)
HCT: 40.4 % (ref 39.0–52.0)
HEMOGLOBIN: 13.6 g/dL (ref 13.0–17.0)
Immature Granulocytes: 0 %
LYMPHS PCT: 19 %
Lymphs Abs: 1.3 10*3/uL (ref 0.7–4.0)
MCH: 28.5 pg (ref 26.0–34.0)
MCHC: 33.7 g/dL (ref 30.0–36.0)
MCV: 84.7 fL (ref 80.0–100.0)
Monocytes Absolute: 0.6 10*3/uL (ref 0.1–1.0)
Monocytes Relative: 9 %
NEUTROS ABS: 5 10*3/uL (ref 1.7–7.7)
NRBC: 0 % (ref 0.0–0.2)
Neutrophils Relative %: 70 %
Platelets: 229 10*3/uL (ref 150–400)
RBC: 4.77 MIL/uL (ref 4.22–5.81)
RDW: 13.9 % (ref 11.5–15.5)
WBC: 7.1 10*3/uL (ref 4.0–10.5)

## 2018-02-16 LAB — BRAIN NATRIURETIC PEPTIDE: B NATRIURETIC PEPTIDE 5: 157 pg/mL — AB (ref 0.0–100.0)

## 2018-02-16 LAB — BASIC METABOLIC PANEL
Anion gap: 11 (ref 5–15)
BUN: 7 mg/dL — AB (ref 8–23)
CALCIUM: 8.7 mg/dL — AB (ref 8.9–10.3)
CHLORIDE: 103 mmol/L (ref 98–111)
CO2: 26 mmol/L (ref 22–32)
CREATININE: 0.83 mg/dL (ref 0.61–1.24)
Glucose, Bld: 102 mg/dL — ABNORMAL HIGH (ref 70–99)
Potassium: 3.6 mmol/L (ref 3.5–5.1)
SODIUM: 140 mmol/L (ref 135–145)

## 2018-02-16 LAB — TROPONIN I

## 2018-02-16 MED ORDER — DILTIAZEM HCL ER COATED BEADS 240 MG PO CP24
240.0000 mg | ORAL_CAPSULE | Freq: Once | ORAL | Status: AC
  Administered 2018-02-16: 14:00:00 240 mg via ORAL
  Filled 2018-02-16: qty 1

## 2018-02-16 MED ORDER — HYDROCODONE-ACETAMINOPHEN 5-325 MG PO TABS: 1.0000 | ORAL_TABLET | ORAL | Status: DC | PRN

## 2018-02-16 MED ORDER — ESCITALOPRAM OXALATE 10 MG PO TABS
20.0000 mg | ORAL_TABLET | ORAL | Status: DC
  Administered 2018-02-16 – 2018-02-19 (×4): 20 mg via ORAL
  Filled 2018-02-16 (×4): qty 2

## 2018-02-16 MED ORDER — BISACODYL 5 MG PO TBEC: 5.0000 mg | DELAYED_RELEASE_TABLET | Freq: Every day | ORAL | Status: DC | PRN

## 2018-02-16 MED ORDER — ALBUTEROL SULFATE (2.5 MG/3ML) 0.083% IN NEBU
5.0000 mg | INHALATION_SOLUTION | Freq: Once | RESPIRATORY_TRACT | Status: AC
  Administered 2018-02-16: 5 mg via RESPIRATORY_TRACT
  Filled 2018-02-16: qty 6

## 2018-02-16 MED ORDER — DIGOXIN 250 MCG PO TABS
0.2500 mg | ORAL_TABLET | Freq: Every day | ORAL | Status: DC
  Administered 2018-02-16 – 2018-02-19 (×4): 0.25 mg via ORAL
  Filled 2018-02-16 (×4): qty 1

## 2018-02-16 MED ORDER — DILTIAZEM HCL ER COATED BEADS 120 MG PO CP24
120.0000 mg | ORAL_CAPSULE | Freq: Every day | ORAL | Status: DC
  Administered 2018-02-16: 12:00:00 120 mg via ORAL
  Filled 2018-02-16: qty 1

## 2018-02-16 MED ORDER — MOMETASONE FURO-FORMOTEROL FUM 200-5 MCG/ACT IN AERO
2.0000 | INHALATION_SPRAY | Freq: Two times a day (BID) | RESPIRATORY_TRACT | Status: DC
  Administered 2018-02-16 – 2018-02-19 (×7): 2 via RESPIRATORY_TRACT
  Filled 2018-02-16: qty 8.8

## 2018-02-16 MED ORDER — PREDNISONE 20 MG PO TABS: 60.0000 mg | ORAL_TABLET | Freq: Every day | ORAL | 0 refills | Status: DC

## 2018-02-16 MED ORDER — INFLUENZA VAC SPLIT QUAD 0.5 ML IM SUSY
0.5000 mL | PREFILLED_SYRINGE | INTRAMUSCULAR | Status: AC
  Administered 2018-02-17: 0.5 mL via INTRAMUSCULAR
  Filled 2018-02-16: qty 0.5

## 2018-02-16 MED ORDER — PANTOPRAZOLE SODIUM 40 MG PO TBEC
40.0000 mg | DELAYED_RELEASE_TABLET | Freq: Every day | ORAL | Status: DC
  Administered 2018-02-16 – 2018-02-19 (×4): 40 mg via ORAL
  Filled 2018-02-16 (×4): qty 1

## 2018-02-16 MED ORDER — IPRATROPIUM-ALBUTEROL 0.5-2.5 (3) MG/3ML IN SOLN
3.0000 mL | Freq: Four times a day (QID) | RESPIRATORY_TRACT | Status: DC
  Administered 2018-02-16 – 2018-02-19 (×12): 3 mL via RESPIRATORY_TRACT
  Filled 2018-02-16 (×12): qty 3

## 2018-02-16 MED ORDER — ACETAMINOPHEN 650 MG RE SUPP: 650.0000 mg | Freq: Four times a day (QID) | RECTAL | Status: DC | PRN

## 2018-02-16 MED ORDER — METHYLPREDNISOLONE SODIUM SUCC 125 MG IJ SOLR
60.0000 mg | Freq: Four times a day (QID) | INTRAMUSCULAR | Status: DC
  Administered 2018-02-16 – 2018-02-17 (×4): 60 mg via INTRAVENOUS
  Filled 2018-02-16 (×4): qty 2

## 2018-02-16 MED ORDER — AZITHROMYCIN 500 MG PO TABS: 500.0000 mg | ORAL_TABLET | Freq: Every day | ORAL | 0 refills | Status: DC

## 2018-02-16 MED ORDER — DILTIAZEM HCL ER COATED BEADS 120 MG PO CP24: 120.0000 mg | ORAL_CAPSULE | Freq: Once | ORAL | Status: DC

## 2018-02-16 MED ORDER — ATORVASTATIN CALCIUM 20 MG PO TABS
10.0000 mg | ORAL_TABLET | Freq: Every day | ORAL | Status: DC
  Administered 2018-02-17 – 2018-02-19 (×3): 10 mg via ORAL
  Filled 2018-02-16 (×3): qty 1

## 2018-02-16 MED ORDER — SIMVASTATIN 20 MG PO TABS
20.0000 mg | ORAL_TABLET | ORAL | Status: DC
  Administered 2018-02-16: 20 mg via ORAL
  Filled 2018-02-16: qty 1

## 2018-02-16 MED ORDER — MIRTAZAPINE 15 MG PO TABS: 15.0000 mg | ORAL_TABLET | Freq: Every day | ORAL | Status: DC

## 2018-02-16 MED ORDER — SODIUM CHLORIDE 0.9 % IV SOLN
INTRAVENOUS | Status: DC
  Administered 2018-02-16 – 2018-02-17 (×2): via INTRAVENOUS

## 2018-02-16 MED ORDER — ONDANSETRON HCL 4 MG/2ML IJ SOLN: 4.0000 mg | Freq: Four times a day (QID) | INTRAMUSCULAR | Status: DC | PRN

## 2018-02-16 MED ORDER — NICOTINE 21 MG/24HR TD PT24
21.0000 mg | MEDICATED_PATCH | Freq: Every day | TRANSDERMAL | Status: DC
  Administered 2018-02-16 – 2018-02-19 (×4): 21 mg via TRANSDERMAL
  Filled 2018-02-16 (×4): qty 1

## 2018-02-16 MED ORDER — ONDANSETRON HCL 4 MG PO TABS: 4.0000 mg | ORAL_TABLET | Freq: Four times a day (QID) | ORAL | Status: DC | PRN

## 2018-02-16 MED ORDER — METOPROLOL TARTRATE 25 MG PO TABS
25.0000 mg | ORAL_TABLET | Freq: Every day | ORAL | Status: DC
  Administered 2018-02-16: 25 mg via ORAL
  Filled 2018-02-16: qty 1

## 2018-02-16 MED ORDER — METHYLPREDNISOLONE SODIUM SUCC 125 MG IJ SOLR
125.0000 mg | Freq: Once | INTRAMUSCULAR | Status: AC
  Administered 2018-02-16: 125 mg via INTRAVENOUS
  Filled 2018-02-16: qty 2

## 2018-02-16 MED ORDER — AZITHROMYCIN 500 MG PO TABS
500.0000 mg | ORAL_TABLET | Freq: Once | ORAL | Status: AC
  Administered 2018-02-16: 500 mg via ORAL
  Filled 2018-02-16: qty 1

## 2018-02-16 MED ORDER — IPRATROPIUM-ALBUTEROL 0.5-2.5 (3) MG/3ML IN SOLN
3.0000 mL | Freq: Once | RESPIRATORY_TRACT | Status: AC
Start: 2018-02-16 — End: 2018-02-16
  Administered 2018-02-16: 3 mL via RESPIRATORY_TRACT
  Filled 2018-02-16: qty 3

## 2018-02-16 MED ORDER — DILTIAZEM HCL ER COATED BEADS 180 MG PO CP24
360.0000 mg | ORAL_CAPSULE | Freq: Every day | ORAL | Status: DC
  Administered 2018-02-17 – 2018-02-18 (×2): 360 mg via ORAL
  Filled 2018-02-16 (×2): qty 2

## 2018-02-16 MED ORDER — HEPARIN SODIUM (PORCINE) 5000 UNIT/ML IJ SOLN: 5000.0000 [IU] | Freq: Three times a day (TID) | INTRAMUSCULAR | Status: DC

## 2018-02-16 MED ORDER — DOCUSATE SODIUM 100 MG PO CAPS
100.0000 mg | ORAL_CAPSULE | Freq: Two times a day (BID) | ORAL | Status: DC
  Administered 2018-02-16 – 2018-02-19 (×6): 100 mg via ORAL
  Filled 2018-02-16 (×7): qty 1

## 2018-02-16 MED ORDER — TRAZODONE HCL 50 MG PO TABS
150.0000 mg | ORAL_TABLET | Freq: Every day | ORAL | Status: DC
  Administered 2018-02-16 – 2018-02-18 (×3): 150 mg via ORAL
  Filled 2018-02-16 (×3): qty 3

## 2018-02-16 MED ORDER — ACETAMINOPHEN 325 MG PO TABS: 650.0000 mg | ORAL_TABLET | Freq: Four times a day (QID) | ORAL | Status: DC | PRN

## 2018-02-16 MED ORDER — HEPARIN SODIUM (PORCINE) 5000 UNIT/ML IJ SOLN
5000.0000 [IU] | Freq: Three times a day (TID) | INTRAMUSCULAR | Status: DC
  Administered 2018-02-17 – 2018-02-19 (×7): 5000 [IU] via SUBCUTANEOUS
  Filled 2018-02-16 (×7): qty 1

## 2018-02-16 MED ORDER — CLONAZEPAM 0.5 MG PO TABS: 0.5000 mg | ORAL_TABLET | Freq: Every evening | ORAL | Status: DC | PRN

## 2018-02-16 MED ORDER — APIXABAN 5 MG PO TABS
5.0000 mg | ORAL_TABLET | Freq: Two times a day (BID) | ORAL | Status: DC
  Administered 2018-02-16: 5 mg via ORAL
  Filled 2018-02-16: qty 1

## 2018-02-16 NOTE — ED Triage Notes (Signed)
Per EMS pt has had approx 12 home albuterol tx over past 24 hours without relief. Pt with hx of COPD, CHF, and hx of mass to lung that he never had any follow up about. Pt is in distress and SOB at rest. Color is ashen. Speaking clearly in complete sentences. #20 L AC also had duoneb enroute. HTN and hasnt taken medication x 1 week because he out

## 2018-02-16 NOTE — ED Provider Notes (Signed)
[facility] Emergency Department Provider Note  Time seen 6:00 AM  I have reviewed the triage vital signs and the nursing notes.   HISTORY  Chief Complaint Respiratory Distress    HPI Ruben [name] is a 62 y.o. male with below list of chronic medical conditions including COPD and CHF presents to the emergency department with a 2-day history of progressive dyspnea, nonproductive cough and chills.  Patient denies any lower extremity pain or swelling.  Patient does admit to 1-1/2 pack/day cigarette use.  Patient was given 1 DuoNeb in route by EMS with improvement of symptoms.   Past Medical History:  Diagnosis Date  . Cancer ([place])    lung cancer  . CHF (congestive heart failure) ([place])   . COPD (chronic obstructive pulmonary disease) ([place])   . Coronary artery disease   . Depression   . Hypertension   . Myocardial infarction ([place])   . Stroke [facility])     Patient Active Problem List   Diagnosis Date Noted  . COPD (chronic obstructive pulmonary disease) ([place]) 05/29/2017  . Acute respiratory failure ([place]) 04/26/2017  . Pneumonia 06/28/2015  . COPD with exacerbation ([place]) 09/25/2014  . Depression 09/25/2014  . COPD exacerbation ([place]) 09/25/2014  . Severe recurrent major depression without psychotic features ([place]) 09/25/2014    Past Surgical History:  Procedure Laterality Date  . CARDIAC CATHETERIZATION    . ELBOW SURGERY Left   . FLEXIBLE BRONCHOSCOPY Bilateral 08/27/2016   Procedure: FLEXIBLE BRONCHOSCOPY;  Surgeon: Allyne Gee, MD;  Location: [facility] ORS;  Service: Pulmonary;  Laterality: Bilateral;  . WRIST SURGERY Left     Prior to Admission medications   Medication Sig Start Date End Date Taking? Authorizing Provider  apixaban (ELIQUIS) 5 MG TABS tablet Take 1 tablet (5 mg total) by mouth 2 (two) times daily. 05/31/17   Max Sane, MD  azithromycin (ZITHROMAX) 500 MG tablet Take 1 tablet (500 mg total) by mouth at bedtime. 05/31/17   Max Sane, MD  clonazePAM (KLONOPIN) 0.5 MG tablet Take 1 tablet (0.5 mg total) by mouth at bedtime as needed (anxiety). 05/31/17   Max Sane, MD  digoxin (LANOXIN) 0.25 MG tablet Take 1 tablet (0.25 mg total) by mouth daily. 04/21/17   Lisa Roca, MD  diltiazem (CARDIZEM CD) 120 MG 24 hr capsule Take 1 capsule (120 mg total) by mouth daily. 05/31/17   Max Sane, MD  escitalopram (LEXAPRO) 20 MG tablet Take 1 tablet (20 mg total) by mouth every morning. 05/31/17   Max Sane, MD  guaiFENesin (MUCINEX) 600 MG 12 hr tablet Take 1 tablet (600 mg total) by mouth 2 (two) times daily. 05/31/17   Max Sane, MD  ipratropium-albuterol (DUONEB) 0.5-2.5 (3) MG/3ML SOLN Take 3 mLs by nebulization 4 (four) times daily as needed. For shortness of breath/wheezing. 04/29/17   Demetrios Loll, MD  ipratropium-albuterol (DUONEB) 0.5-2.5 (3) MG/3ML SOLN Take 3 mLs by nebulization every 6 (six) hours. 05/31/17   Max Sane, MD  metoprolol tartrate (LOPRESSOR) 25 MG tablet Take 1 tablet (25 mg total) by mouth daily. 05/31/17   Max Sane, MD  mirtazapine (REMERON) 15 MG tablet Take 1 tablet (15 mg total) by mouth at bedtime. 05/31/17   Max Sane, MD  mometasone-formoterol (DULERA) 200-5 MCG/ACT AERO Inhale 2 puffs into the lungs 2 (two) times daily. 05/31/17   Max Sane, MD  nicotine (NICODERM CQ - DOSED IN MG/24 HOURS) 14 mg/24hr patch Place 1 patch (14 mg total) onto the skin  daily. 05/31/17   Max Sane, MD  pantoprazole (PROTONIX) 40 MG tablet Take 1 tablet (40 mg total) by mouth daily. 05/31/17   Max Sane, MD  simvastatin (ZOCOR) 20 MG tablet Take 1 tablet (20 mg total) by mouth every morning. 05/31/17   Max Sane, MD  traZODone (DESYREL) 150 MG tablet Take 1 tablet (150 mg total) by mouth at bedtime. 05/31/17   Max Sane, MD    Allergies No known drug allergies  Family History  Problem Relation Age of Onset  . Heart disease Unknown   . Hypertension Unknown   . Diabetes Unknown   . CAD Mother     Social  History Social History   Tobacco Use  . Smoking status: Current Every Day Smoker    Packs/day: 1.00    Years: 43.00    Pack years: 43.00    Types: Cigarettes  . Smokeless tobacco: Never Used  Substance Use Topics  . Alcohol use: No  . Drug use: Yes    Frequency: 4.0 times per week    Types: Marijuana    Review of Systems Constitutional: No fever/chills Eyes: No visual changes. ENT: No sore throat. Cardiovascular: Denies chest pain. Respiratory: Denies shortness of breath. Gastrointestinal: No abdominal pain.  No nausea, no vomiting.  No diarrhea.  No constipation. Genitourinary: Negative for dysuria. Musculoskeletal: Negative for neck pain.  Negative for back pain. Integumentary: Negative for rash. Neurological: Negative for headaches, focal weakness or numbness.   ____________________________________________   PHYSICAL EXAM:  VITAL SIGNS: ED Triage Vitals  Enc Vitals Group     BP 02/16/18 0613 (!) 157/84     Pulse Rate 02/16/18 0613 64     Resp 02/16/18 0613 20     Temp 02/16/18 0613 98.4 F (36.9 C)     Temp Source 02/16/18 0613 Oral     SpO2 02/16/18 0613 99 %     Weight 02/16/18 0616 69 kg (152 lb 1.9 oz)     Height 02/16/18 0616 1.778 m (_0 )     Head Circumference --      Peak Flow --      Pain Score 02/16/18 0614 6     Pain Loc --      Pain Edu? --      Excl. in [place]? --     Constitutional: Alert and oriented.  Apparent respiratory difficulty eyes: Conjunctivae are normal. Head: Atraumatic. Mouth/Throat: Mucous membranes are moist.  Oropharynx non-erythematous. Neck: No stridor.   Cardiovascular: Normal rate, regular rhythm. Good peripheral circulation. Grossly normal heart sounds. Respiratory: Tachypnea, positive accessory respiratory muscle use, diffuse rhonchi. Gastrointestinal: Soft and nontender. No distention.  Musculoskeletal: No lower extremity tenderness nor edema. No gross deformities of extremities. Neurologic:  Normal speech and  language. No gross focal neurologic deficits are appreciated.  Skin:  Skin is warm, dry and intact. No rash noted.   ____________________________________________   LABS (all labs ordered are listed, but only abnormal results are displayed)  Labs Reviewed  BASIC METABOLIC PANEL - Abnormal; Notable for the following components:      Result Value   Glucose, Bld 102 (*)    BUN 7 (*)    Calcium 8.7 (*)    All other components within normal limits  CBC WITH DIFFERENTIAL/PLATELET  TROPONIN I  BRAIN NATRIURETIC PEPTIDE   ____________________________________________  EKG  ED ECG REPORT I, Quentin N , the attending physician, personally viewed and interpreted this ECG.   Date: 02/16/2018  EKG Time: 6:07 AM  Rate: 70  Rhythm: Normal sinus rhythm  Axis: Normal  Intervals:Normal  ST&T Change: None  ____________________________________________  RADIOLOGY I, Rosiclare N , personally viewed and evaluated these images (plain radiographs) as part of my medical decision making, as well as reviewing the written report by the radiologist.  ED MD interpretation: Emphysematous changes and chronic bronchitic changes on chest x-ray per radiologist  Official radiology report(s): Dg Chest 2 View  Result Date: 02/16/2018 CLINICAL DATA:  Shortness of breath. History of COPD and CHF. Lung mass. EXAM: CHEST - 2 VIEW COMPARISON:  CT chest 05/30/2017.  Chest 05/29/2017. FINDINGS: Normal heart size and pulmonary vascularity. Emphysematous changes and scattered fibrosis in the lungs. Central interstitial pattern and peribronchial thickening consistent with chronic bronchitis. No airspace disease or consolidation. No blunting of costophrenic angles. No pneumothorax. Mediastinal contours appear intact. IMPRESSION: Emphysematous and chronic bronchitic changes in the lungs. No evidence of active pulmonary disease. Electronically Signed   By: Lucienne Capers M.D.   On: 02/16/2018 06:31      Procedures   ____________________________________________   INITIAL IMPRESSION / ASSESSMENT AND PLAN / ED COURSE  As part of my medical decision making, I reviewed the following data within the electronic MEDICAL RECORD NUMBER   62 year old male presenting with above-stated history and physical exam concerning for possible COPD exacerbation versus pneumonia.  Chest x-ray consistent with emphysematous changes.  Patient given 1 DuoNeb IV Solu-Medrol 125 mg with improvement in symptoms on reevaluation.  Anticipate that the patient will be able to be discharged home.  Patient will be prescribed azithromycin and prednisone for home. ____________________________________________  FINAL CLINICAL IMPRESSION(S) / ED DIAGNOSES  Final diagnoses:  COPD exacerbation ([place])     MEDICATIONS GIVEN DURING THIS VISIT:  Medications  azithromycin (ZITHROMAX) tablet 500 mg (has no administration in time range)  ipratropium-albuterol (DUONEB) 0.5-2.5 (3) MG/3ML nebulizer solution 3 mL (3 mLs Nebulization Given 02/16/18 0621)  methylPREDNISolone sodium succinate (SOLU-MEDROL) 125 mg/2 mL injection 125 mg (125 mg Intravenous Given 02/16/18 3710)     ED Discharge Orders    None       Note:  This document was prepared using Dragon voice recognition software and may include unintentional dictation errors.    Gregor Hams, MD 02/16/18 [phone]

## 2018-02-16 NOTE — Plan of Care (Signed)
  Problem: Education: Goal: Knowledge of General Education information will improve Description Including pain rating scale, medication(s)/side effects and non-pharmacologic comfort measures Outcome: Progressing   Problem: Health Behavior/Discharge Planning: Goal: Ability to manage health-related needs will improve Outcome: Progressing   Problem: Clinical Measurements: Goal: Ability to maintain clinical measurements within normal limits will improve Outcome: Progressing   Problem: Activity: Goal: Risk for activity intolerance will decrease Outcome: Progressing   Problem: Education: Goal: Knowledge of disease or condition will improve Outcome: Progressing   Problem: Activity: Goal: Ability to tolerate increased activity will improve Outcome: Progressing   Problem: Respiratory: Goal: Levels of oxygenation will improve Outcome: Progressing

## 2018-02-16 NOTE — H&P (Addendum)
[place] [facility] [place] NAME: Ruben [name]    MR#:  [id number]  DATE OF BIRTH:  [birth year]  DATE OF ADMISSION:  02/16/2018  PRIMARY CARE PHYSICIAN: Tandy Gaw, PA   REQUESTING/REFERRING PHYSICIAN: Schuyler Amor, MD  CHIEF COMPLAINT:   Chief Complaint  Patient presents with  . Respiratory Distress    HISTORY OF PRESENT ILLNESS:  Ruben [name]  is a 62 y.o. male with a known history of COPD and CHF admitted with a 2-day history of progressive dyspnea, nonproductive cough and chills. Patient does admit to 1-1/2 pack/day cigarette use. Admitted for COPD exacerbation. PAST MEDICAL HISTORY:   Past Medical History:  Diagnosis Date  . Cancer ([place])    lung cancer  . CHF (congestive heart failure) ([place])   . COPD (chronic obstructive pulmonary disease) ([place])   . Coronary artery disease   . Depression   . Hypertension   . Myocardial infarction ([place])   . Stroke [facility])     PAST SURGICAL HISTORY:   Past Surgical History:  Procedure Laterality Date  . CARDIAC CATHETERIZATION    . ELBOW SURGERY Left   . FLEXIBLE BRONCHOSCOPY Bilateral 08/27/2016   Procedure: FLEXIBLE BRONCHOSCOPY;  Surgeon: Allyne Gee, MD;  Location: [facility] ORS;  Service: Pulmonary;  Laterality: Bilateral;  . WRIST SURGERY Left     SOCIAL HISTORY:   Social History   Tobacco Use  . Smoking status: Current Every Day Smoker    Packs/day: 1.00    Years: 43.00    Pack years: 43.00    Types: Cigarettes  . Smokeless tobacco: Never Used  Substance Use Topics  . Alcohol use: No    FAMILY HISTORY:   Family History  Problem Relation Age of Onset  . Heart disease Unknown   . Hypertension Unknown   . Diabetes Unknown   . CAD Mother     DRUG ALLERGIES:  No Known Allergies  REVIEW OF SYSTEMS:   Review of Systems  Constitutional: Negative for chills, fever and weight loss.  HENT: Negative for nosebleeds and sore throat.   Eyes: Negative for blurred  vision.  Respiratory: Positive for cough and shortness of breath. Negative for wheezing.   Cardiovascular: Negative for chest pain, orthopnea, leg swelling and PND.  Gastrointestinal: Negative for abdominal pain, constipation, diarrhea, heartburn, nausea and vomiting.  Genitourinary: Negative for dysuria and urgency.  Musculoskeletal: Negative for back pain.  Skin: Negative for rash.  Neurological: Negative for dizziness, speech change, focal weakness and headaches.  Endo/Heme/Allergies: Does not bruise/bleed easily.  Psychiatric/Behavioral: Negative for depression.   MEDICATIONS AT HOME:   Prior to Admission medications   Medication Sig Start Date End Date Taking? Authorizing Provider  albuterol (PROVENTIL HFA;VENTOLIN HFA) 108 (90 Base) MCG/ACT inhaler Inhale 2 puffs into the lungs every 4 (four) hours as needed for wheezing.   Yes [provider]  budesonide (PULMICORT) 0.5 MG/2ML nebulizer solution Take 0.5 mg by nebulization 2 (two) times daily.   Yes [provider]  clonazePAM (KLONOPIN) 0.5 MG tablet Take 1 tablet (0.5 mg total) by mouth at bedtime as needed (anxiety). Patient taking differently: Take 0.5 mg by mouth 2 (two) times daily.  05/31/17  Yes Max Sane, MD  cyclobenzaprine (FLEXERIL) 5 MG tablet Take 5 mg by mouth at bedtime as needed for muscle spasms.   Yes [provider]  digoxin (LANOXIN) 0.25 MG tablet Take 1 tablet (0.25 mg total) by mouth daily. 04/21/17  Yes Lord,  Wells Guiles, MD  diltiazem [facility]) 360 MG 24 hr capsule Take 360 mg by mouth daily.   Yes [provider]  escitalopram (LEXAPRO) 20 MG tablet Take 1 tablet (20 mg total) by mouth every morning. 05/31/17  Yes Max Sane, MD  Fluticasone-Salmeterol (ADVAIR) 250-50 MCG/DOSE AEPB Inhale 1 puff into the lungs 2 (two) times daily.   Yes [provider]  ipratropium-albuterol (DUONEB) 0.5-2.5 (3) MG/3ML SOLN Take 3 mLs by nebulization 4 (four) times daily as needed. For  shortness of breath/wheezing. Patient taking differently: Take 3 mLs by nebulization every 6 (six) hours as needed (for shortness of breath/wheezing).  04/29/17  Yes Demetrios Loll, MD  metoprolol tartrate (LOPRESSOR) 50 MG tablet Take 50 mg by mouth 2 (two) times daily.   Yes [provider]  omeprazole (PRILOSEC) 40 MG capsule Take 40 mg by mouth daily.   Yes [provider]  simvastatin (ZOCOR) 20 MG tablet Take 1 tablet (20 mg total) by mouth every morning. Patient taking differently: Take 20 mg by mouth every evening.  05/31/17  Yes Max Sane, MD  traZODone (DESYREL) 150 MG tablet Take 1 tablet (150 mg total) by mouth at bedtime. 05/31/17  Yes Max Sane, MD  apixaban (ELIQUIS) 5 MG TABS tablet Take 1 tablet (5 mg total) by mouth 2 (two) times daily. Patient not taking: Reported on 02/16/2018 05/31/17   Max Sane, MD  azithromycin (ZITHROMAX) 500 MG tablet Take 1 tablet (500 mg total) by mouth daily for 3 days. Take 1 tablet daily for 3 days. 02/16/18 02/19/18  Gregor Hams, MD  diltiazem (CARDIZEM CD) 120 MG 24 hr capsule Take 1 capsule (120 mg total) by mouth daily. Patient not taking: Reported on 02/16/2018 05/31/17   Max Sane, MD  metoprolol tartrate (LOPRESSOR) 25 MG tablet Take 1 tablet (25 mg total) by mouth daily. Patient not taking: Reported on 02/16/2018 05/31/17   Max Sane, MD  mirtazapine (REMERON) 15 MG tablet Take 1 tablet (15 mg total) by mouth at bedtime. Patient not taking: Reported on 02/16/2018 05/31/17   Max Sane, MD  mometasone-formoterol [facility]) 200-5 MCG/ACT AERO Inhale 2 puffs into the lungs 2 (two) times daily. Patient not taking: Reported on 02/16/2018 05/31/17   Max Sane, MD  pantoprazole (PROTONIX) 40 MG tablet Take 1 tablet (40 mg total) by mouth daily. Patient not taking: Reported on 02/16/2018 05/31/17   Max Sane, MD  predniSONE (DELTASONE) 20 MG tablet Take 3 tablets (60 mg total) by mouth daily for 5 days. 02/16/18 02/21/18  Gregor Hams, MD      VITAL SIGNS:  Blood pressure (!) 159/94, pulse 97, temperature 98 F (36.7 C), temperature source Oral, resp. rate 20, height _0  (1.778 m), weight 69 kg, SpO2 97 %. PHYSICAL EXAMINATION:  Physical Exam  GENERAL:  62 y.o.-year-old patient lying in the bed with no acute distress.  EYES: Pupils equal, round, reactive to light and accommodation. No scleral icterus. Extraocular muscles intact.  HEENT: Head atraumatic, normocephalic. Oropharynx and nasopharynx clear.  NECK:  Supple, no jugular venous distention. No thyroid enlargement, no tenderness.  LUNGS: Decreased breath sounds bilaterally, + wheezing, No rales,rhonchi or crepitation. Using accessory muscles of respiration.  CARDIOVASCULAR: S1, S2 normal. No murmurs, rubs, or gallops.  ABDOMEN: Soft, nontender, nondistended. Bowel sounds present. No organomegaly or mass.  EXTREMITIES: No pedal edema, cyanosis, or clubbing.  NEUROLOGIC: Cranial nerves II through XII are intact. Muscle strength 5/5 in all extremities. Sensation intact. Gait not checked.  PSYCHIATRIC: The patient is alert and oriented x 3.  SKIN: No obvious rash, lesion, or ulcer.  LABORATORY PANEL:   CBC Recent Labs  Lab 02/16/18 0613  WBC 7.1  HGB 13.6  HCT 40.4  PLT 229   ------------------------------------------------------------------------------------------------------------------  Chemistries  Recent Labs  Lab 02/16/18 0613  NA 140  K 3.6  CL 103  CO2 26  GLUCOSE 102*  BUN 7*  CREATININE 0.83  CALCIUM 8.7*   ------------------------------------------------------------------------------------------------------------------  Cardiac Enzymes Recent Labs  Lab 02/16/18 0613  TROPONINI <0.03   ------------------------------------------------------------------------------------------------------------------  RADIOLOGY:  Dg Chest 2 View  Result Date: 02/16/2018 CLINICAL DATA:  Shortness of breath. History of COPD and CHF.  Lung mass. EXAM: CHEST - 2 VIEW COMPARISON:  CT chest 05/30/2017.  Chest 05/29/2017. FINDINGS: Normal heart size and pulmonary vascularity. Emphysematous changes and scattered fibrosis in the lungs. Central interstitial pattern and peribronchial thickening consistent with chronic bronchitis. No airspace disease or consolidation. No blunting of costophrenic angles. No pneumothorax. Mediastinal contours appear intact. IMPRESSION: Emphysematous and chronic bronchitic changes in the lungs. No evidence of active pulmonary disease. Electronically Signed   By: Lucienne Capers M.D.   On: 02/16/2018 06:31   IMPRESSION AND PLAN:  76 y m with COPD exacerbation  * COPD exacerbation - IV steroids, nebs  * HTN - continue cardizem  * Anxiety: continue Klonopin  * Tobacco Abuse: counseled for smoking cessation for 4 mins - nicotine patch ordered per request    All the records are reviewed and case discussed with ED provider. Management plans discussed with the patient, Nursing and they are in agreement.  CODE STATUS: FULL CODE  TOTAL TIME TAKING CARE OF THIS PATIENT: 45 minutes.    Max Sane M.D on 02/16/2018 at 10:46 AM  Between 7am to 6pm - Pager - [phone]  After 6pm go to [email or web address] - Proofreader  [facility]  Office  [phone]  CC: Primary care physician; Tandy Gaw, PA   Note: This dictation was prepared with Dragon dictation along with smaller phrase technology. Any transcriptional errors that result from this process are unintentional.

## 2018-02-16 NOTE — ED Provider Notes (Addendum)
-----------------------------------------   7:49 AM on 02/16/2018 -----------------------------------------  patient signed out to me this morning at 7:15, has a history of COPD, received nebulizers and steroids, does continue to smoke. On exam he is in no acute respiratory distress, he does not require BiPAP at this time, sats are in the mid to low 90s on room air however he does have a recurrence of his wheeze and feels unsafe to go home. Chest x-ray and blood work are appreciated. We will admit for further observation, patient does not usually get dischargedwhen he has these flares.   Schuyler Amor, MD 02/16/18 1586    Schuyler Amor, MD 02/16/18 [phone]

## 2018-02-16 NOTE — ED Notes (Signed)
Patient transported to X-ray 

## 2018-02-16 NOTE — ED Notes (Signed)
Pt given graham crackers, cereal, and water

## 2018-02-16 NOTE — Progress Notes (Signed)
PHARMACIST - PHYSICIAN COMMUNICATION  DR:   Manuella Ghazi  CONCERNING:Simvastatin and Diltiazem Interaction  RECOMMENDATION: Patients on diltiazem and simvastatin >10 mg/day have reported cases of rhabdomyolysis. Pharmacy to assess simvastatin dose. If >10 mg, substitute atorvastatin (Lipitor) 1mg  for each 2mg  simvastatin.  Will change simvastatin 20mg  to Lipitor 10mg  per protocol.  Eston Mould, [facility] 02/16/2018 2:47 PM

## 2018-02-17 DIAGNOSIS — I509 Heart failure, unspecified: Secondary | ICD-10-CM | POA: Diagnosis present

## 2018-02-17 DIAGNOSIS — Z79899 Other long term (current) drug therapy: Secondary | ICD-10-CM | POA: Diagnosis not present

## 2018-02-17 DIAGNOSIS — Z85118 Personal history of other malignant neoplasm of bronchus and lung: Secondary | ICD-10-CM | POA: Diagnosis not present

## 2018-02-17 DIAGNOSIS — Z7951 Long term (current) use of inhaled steroids: Secondary | ICD-10-CM | POA: Diagnosis not present

## 2018-02-17 DIAGNOSIS — F419 Anxiety disorder, unspecified: Secondary | ICD-10-CM | POA: Diagnosis present

## 2018-02-17 DIAGNOSIS — Z23 Encounter for immunization: Secondary | ICD-10-CM | POA: Diagnosis not present

## 2018-02-17 DIAGNOSIS — Z8249 Family history of ischemic heart disease and other diseases of the circulatory system: Secondary | ICD-10-CM | POA: Diagnosis not present

## 2018-02-17 DIAGNOSIS — I2510 Atherosclerotic heart disease of native coronary artery without angina pectoris: Secondary | ICD-10-CM | POA: Diagnosis present

## 2018-02-17 DIAGNOSIS — Z8673 Personal history of transient ischemic attack (TIA), and cerebral infarction without residual deficits: Secondary | ICD-10-CM | POA: Diagnosis not present

## 2018-02-17 DIAGNOSIS — Z7901 Long term (current) use of anticoagulants: Secondary | ICD-10-CM | POA: Diagnosis not present

## 2018-02-17 DIAGNOSIS — I252 Old myocardial infarction: Secondary | ICD-10-CM | POA: Diagnosis not present

## 2018-02-17 DIAGNOSIS — I110 Hypertensive heart disease with heart failure: Secondary | ICD-10-CM | POA: Diagnosis present

## 2018-02-17 DIAGNOSIS — J441 Chronic obstructive pulmonary disease with (acute) exacerbation: Secondary | ICD-10-CM | POA: Diagnosis not present

## 2018-02-17 DIAGNOSIS — F17210 Nicotine dependence, cigarettes, uncomplicated: Secondary | ICD-10-CM | POA: Diagnosis present

## 2018-02-17 LAB — BASIC METABOLIC PANEL
ANION GAP: 6 (ref 5–15)
BUN: 18 mg/dL (ref 8–23)
CHLORIDE: 107 mmol/L (ref 98–111)
CO2: 27 mmol/L (ref 22–32)
Calcium: 9 mg/dL (ref 8.9–10.3)
Creatinine, Ser: 0.97 mg/dL (ref 0.61–1.24)
GFR calc Af Amer: >60 mL/min (ref >60)
GFR calc non Af Amer: >60 mL/min (ref >60)
GLUCOSE: 166 mg/dL — AB (ref 70–99)
Potassium: 4.3 mmol/L (ref 3.5–5.1)
Sodium: 140 mmol/L (ref 135–145)

## 2018-02-17 LAB — CBC
HEMATOCRIT: 41.8 % (ref 39.0–52.0)
HEMOGLOBIN: 13.7 g/dL (ref 13.0–17.0)
MCH: 28.2 pg (ref 26.0–34.0)
MCHC: 32.8 g/dL (ref 30.0–36.0)
MCV: 86 fL (ref 80.0–100.0)
PLATELETS: 236 10*3/uL (ref 150–400)
RBC: 4.86 MIL/uL (ref 4.22–5.81)
RDW: 13.6 % (ref 11.5–15.5)
WBC: 11.7 10*3/uL — ABNORMAL HIGH (ref 4.0–10.5)
nRBC: 0 % (ref 0.0–0.2)

## 2018-02-17 LAB — GLUCOSE, CAPILLARY: Glucose-Capillary: 134 mg/dL — ABNORMAL HIGH (ref 70–99)

## 2018-02-17 MED ORDER — CLONAZEPAM 0.5 MG PO TABS
0.5000 mg | ORAL_TABLET | Freq: Two times a day (BID) | ORAL | Status: DC
  Administered 2018-02-17 – 2018-02-19 (×5): 0.5 mg via ORAL
  Filled 2018-02-17 (×5): qty 1

## 2018-02-17 MED ORDER — METHYLPREDNISOLONE SODIUM SUCC 125 MG IJ SOLR
60.0000 mg | Freq: Two times a day (BID) | INTRAMUSCULAR | Status: DC
  Administered 2018-02-17 – 2018-02-18 (×2): 60 mg via INTRAVENOUS
  Filled 2018-02-17 (×2): qty 2

## 2018-02-17 MED ORDER — METOPROLOL TARTRATE 50 MG PO TABS
50.0000 mg | ORAL_TABLET | Freq: Two times a day (BID) | ORAL | Status: DC
  Administered 2018-02-17 – 2018-02-18 (×2): 50 mg via ORAL
  Filled 2018-02-17 (×3): qty 1

## 2018-02-17 NOTE — Progress Notes (Signed)
[facility] [place] NAME: Ruben [name]    MR#:  [id number]  DATE OF BIRTH:  [birth year]  SUBJECTIVE:  CHIEF COMPLAINT:   Chief Complaint  Patient presents with  . Respiratory Distress  c/o rt rib cage pain from recent fall, some SOB & cough +, requests his Klonopin REVIEW OF SYSTEMS:  Review of Systems  Constitutional: Negative for diaphoresis, fever, malaise/fatigue and weight loss.  HENT: Negative for ear discharge, ear pain, hearing loss, nosebleeds, sore throat and tinnitus.   Eyes: Negative for blurred vision and pain.  Respiratory: Positive for cough and shortness of breath. Negative for hemoptysis and wheezing.   Cardiovascular: Negative for chest pain, palpitations, orthopnea and leg swelling.  Gastrointestinal: Negative for abdominal pain, blood in stool, constipation, diarrhea, heartburn, nausea and vomiting.  Genitourinary: Negative for dysuria, frequency and urgency.  Musculoskeletal: Negative for back pain and myalgias.  Skin: Negative for itching and rash.  Neurological: Negative for dizziness, tingling, tremors, focal weakness, seizures, weakness and headaches.  Psychiatric/Behavioral: Negative for depression. The patient is not nervous/anxious.     DRUG ALLERGIES:  No Known Allergies VITALS:  Blood pressure 132/65, pulse 71, temperature (!) 97.5 F (36.4 C), temperature source Oral, resp. rate 17, height 5\' 10"  (1.778 m), weight 69 kg, SpO2 94 %. PHYSICAL EXAMINATION:  Physical Exam  Constitutional: He is oriented to person, place, and time.  HENT:  Head: Normocephalic and atraumatic.  Eyes: Pupils are equal, round, and reactive to light. Conjunctivae and EOM are normal.  Neck: Normal range of motion. Neck supple. No tracheal deviation present. No thyromegaly present.  Cardiovascular: Normal rate, regular rhythm and normal heart sounds.  Pulmonary/Chest: Effort normal and breath sounds normal. No respiratory  distress. He has no wheezes. He exhibits no tenderness.  Abdominal: Soft. Bowel sounds are normal. He exhibits no distension. There is no tenderness.  Musculoskeletal: Normal range of motion.  Neurological: He is alert and oriented to person, place, and time. No cranial nerve deficit.  Skin: Skin is warm and dry. No rash noted.   LABORATORY PANEL:  Male CBC Recent Labs  Lab 02/17/18 0453  WBC 11.7*  HGB 13.7  HCT 41.8  PLT 236   ------------------------------------------------------------------------------------------------------------------ Chemistries  Recent Labs  Lab 02/17/18 0453  NA 140  K 4.3  CL 107  CO2 27  GLUCOSE 166*  BUN 18  CREATININE 0.97  CALCIUM 9.0   RADIOLOGY:  No results found. ASSESSMENT AND PLAN:  60 y m with COPD exacerbation  * COPD exacerbation - continue IV steroids, nebs  * HTN - continue cardizem & metoprolol  * Anxiety: continue Klonopin  * Tobacco Abuse: counseled for smoking cessation for 4 mins - nicotine patch ordered per request     All the records are reviewed and case discussed with Care Management/Social Worker. Management plans discussed with the patient, nursing and they are in agreement.  CODE STATUS: Full Code  TOTAL TIME TAKING CARE OF THIS PATIENT: 35 minutes.   More than 50% of the time was spent in counseling/coordination of care: YES  POSSIBLE D/C IN 1-2 DAYS, DEPENDING ON CLINICAL CONDITION.   Max Sane M.D on 02/17/2018 at 10:02 AM  Between 7am to 6pm - Pager - [phone]  After 6pm go to [email or web address] - Proofreader  [facility]  Office  [phone]  CC: Primary care physician; Tandy Gaw, PA  Note: This dictation was prepared with Dragon dictation along with smaller  Company secretary. Any transcriptional errors that result from this process are unintentional.

## 2018-02-17 NOTE — Progress Notes (Signed)
Patient ambulated around the nursing station one time, o2 saturation remained at 92% on room air

## 2018-02-18 LAB — CBC
HCT: 40.5 % (ref 39.0–52.0)
Hemoglobin: 13.1 g/dL (ref 13.0–17.0)
MCH: 28.2 pg (ref 26.0–34.0)
MCHC: 32.3 g/dL (ref 30.0–36.0)
MCV: 87.3 fL (ref 80.0–100.0)
NRBC: 0 % (ref 0.0–0.2)
PLATELETS: 254 10*3/uL (ref 150–400)
RBC: 4.64 MIL/uL (ref 4.22–5.81)
RDW: 14.1 % (ref 11.5–15.5)
WBC: 15.6 10*3/uL — AB (ref 4.0–10.5)

## 2018-02-18 LAB — BASIC METABOLIC PANEL
Anion gap: 5 (ref 5–15)
BUN: 22 mg/dL (ref 8–23)
CALCIUM: 9 mg/dL (ref 8.9–10.3)
CO2: 28 mmol/L (ref 22–32)
CREATININE: 0.88 mg/dL (ref 0.61–1.24)
Chloride: 106 mmol/L (ref 98–111)
GFR calc Af Amer: >60 mL/min (ref >60)
Glucose, Bld: 147 mg/dL — ABNORMAL HIGH (ref 70–99)
Potassium: 4.2 mmol/L (ref 3.5–5.1)
SODIUM: 139 mmol/L (ref 135–145)

## 2018-02-18 LAB — GLUCOSE, CAPILLARY: Glucose-Capillary: 126 mg/dL — ABNORMAL HIGH (ref 70–99)

## 2018-02-18 LAB — HIV ANTIBODY (ROUTINE TESTING W REFLEX): HIV SCREEN 4TH GENERATION: NONREACTIVE

## 2018-02-18 MED ORDER — METHYLPREDNISOLONE SODIUM SUCC 40 MG IJ SOLR
40.0000 mg | Freq: Two times a day (BID) | INTRAMUSCULAR | Status: DC
  Administered 2018-02-18 – 2018-02-19 (×2): 40 mg via INTRAVENOUS
  Filled 2018-02-18 (×2): qty 1

## 2018-02-18 MED ORDER — METOPROLOL TARTRATE 25 MG PO TABS
25.0000 mg | ORAL_TABLET | Freq: Two times a day (BID) | ORAL | Status: DC
  Administered 2018-02-18 – 2018-02-19 (×2): 25 mg via ORAL
  Filled 2018-02-18 (×2): qty 1

## 2018-02-18 MED ORDER — DILTIAZEM HCL ER COATED BEADS 300 MG PO CP24
300.0000 mg | ORAL_CAPSULE | Freq: Every day | ORAL | Status: DC
  Administered 2018-02-19: 300 mg via ORAL
  Filled 2018-02-18: qty 1

## 2018-02-18 NOTE — Progress Notes (Signed)
[facility] [place] NAME: Petro Talent    MR#:  [id number]  DATE OF BIRTH:  09/28/1955  SUBJECTIVE:  CHIEF COMPLAINT:   Chief Complaint  Patient presents with  . Respiratory Distress  Patient is feeling better today, less shortness of breath, ambulated around nurses station. REVIEW OF SYSTEMS:  Review of Systems  Constitutional: Negative for diaphoresis, fever, malaise/fatigue and weight loss.  HENT: Negative for ear discharge, ear pain, hearing loss, nosebleeds, sore throat and tinnitus.   Eyes: Negative for blurred vision and pain.  Respiratory: Positive for cough and shortness of breath. Negative for hemoptysis and wheezing.   Cardiovascular: Negative for chest pain, palpitations, orthopnea and leg swelling.  Gastrointestinal: Negative for abdominal pain, blood in stool, constipation, diarrhea, heartburn, nausea and vomiting.  Genitourinary: Negative for dysuria, frequency and urgency.  Musculoskeletal: Negative for back pain and myalgias.  Skin: Negative for itching and rash.  Neurological: Negative for dizziness, tingling, tremors, focal weakness, seizures, weakness and headaches.  Psychiatric/Behavioral: Negative for depression. The patient is not nervous/anxious.     DRUG ALLERGIES:  No Known Allergies VITALS:  Blood pressure (!) 138/58, pulse (!) 55, temperature (!) 97.4 F (36.3 C), temperature source Oral, resp. rate 20, height 5\' 10"  (1.778 m), weight 69 kg, SpO2 90 %. PHYSICAL EXAMINATION:  Physical Exam  Constitutional: He is oriented to person, place, and time.  HENT:  Head: Normocephalic and atraumatic.  Eyes: Pupils are equal, round, and reactive to light. Conjunctivae and EOM are normal.  Neck: Normal range of motion. Neck supple. No tracheal deviation present. No thyromegaly present.  Cardiovascular: Normal rate, regular rhythm and normal heart sounds.  Pulmonary/Chest: Effort normal and breath sounds normal. No  respiratory distress. He has no wheezes. He exhibits no tenderness.  Abdominal: Soft. Bowel sounds are normal. He exhibits no distension. There is no tenderness.  Musculoskeletal: Normal range of motion.  Neurological: He is alert and oriented to person, place, and time. No cranial nerve deficit.  Skin: Skin is warm and dry. No rash noted.   LABORATORY PANEL:  Male CBC Recent Labs  Lab 02/18/18 0356  WBC 15.6*  HGB 13.1  HCT 40.5  PLT 254   ------------------------------------------------------------------------------------------------------------------ Chemistries  Recent Labs  Lab 02/18/18 0356  NA 139  K 4.2  CL 106  CO2 28  GLUCOSE 147*  BUN 22  CREATININE 0.88  CALCIUM 9.0   RADIOLOGY:  No results found. ASSESSMENT AND PLAN:  39 y m with COPD exacerbation  * COPD exacerbation -Clinically improving, decrease the dose of IV steroids, possible discharge home with prednisone dose taper tomorrow.  * HTN - continue cardizem & metoprolol patient told me that he does not take any heart medicines.  But he is on Cardizem CD to 60 mg daily, metoprolol 50 mg p.o. twice daily.  I see these medicines as listed entries are ordered.  * Anxiety: continue Klonopin  * Tobacco Abuse: counseled for smoking cessation for 4 mins - nicotine patch ordered per request  Likely discharge hometomorrow advised the patient to quit smoking again.   All the records are reviewed and case discussed with Care Management/Social Worker. Management plans discussed with the patient, nursing and they are in agreement.  CODE STATUS: Full Code  TOTAL TIME TAKING CARE OF THIS PATIENT: 35 minutes.   More than 50% of the time was spent in counseling/coordination of care: YES  POSSIBLE D/C IN 1-2 DAYS, DEPENDING ON CLINICAL CONDITION.  Epifanio Lesches M.D on 02/18/2018 at 1:59 PM  Between 7am to 6pm - Pager - [phone]  After 6pm go to [email or web address] - Proofreader  [facility]  Office  [phone]  CC: Primary care physician; Tandy Gaw, PA  Note: This dictation was prepared with Dragon dictation along with smaller phrase technology. Any transcriptional errors that result from this process are unintentional.

## 2018-02-19 LAB — GLUCOSE, CAPILLARY: GLUCOSE-CAPILLARY: 110 mg/dL — AB (ref 70–99)

## 2018-02-19 MED ORDER — ALBUTEROL SULFATE HFA 108 (90 BASE) MCG/ACT IN AERS: 2.0000 | INHALATION_SPRAY | Freq: Four times a day (QID) | RESPIRATORY_TRACT | 2 refills | Status: DC | PRN

## 2018-02-19 MED ORDER — ATORVASTATIN CALCIUM 10 MG PO TABS: 10.0000 mg | ORAL_TABLET | Freq: Every day | ORAL | 0 refills | Status: DC

## 2018-02-19 MED ORDER — DIGOXIN 250 MCG PO TABS: 0.2500 mg | ORAL_TABLET | Freq: Every day | ORAL | 0 refills | Status: DC

## 2018-02-19 MED ORDER — DILTIAZEM HCL ER COATED BEADS 300 MG PO CP24: 300.0000 mg | ORAL_CAPSULE | Freq: Every day | ORAL | 0 refills | Status: DC

## 2018-02-19 MED ORDER — CLONAZEPAM 0.5 MG PO TABS: 0.5000 mg | ORAL_TABLET | Freq: Two times a day (BID) | ORAL | 0 refills | Status: DC

## 2018-02-19 MED ORDER — METOPROLOL TARTRATE 25 MG PO TABS: 25.0000 mg | ORAL_TABLET | Freq: Two times a day (BID) | ORAL | 0 refills | Status: DC

## 2018-02-19 MED ORDER — MOMETASONE FURO-FORMOTEROL FUM 200-5 MCG/ACT IN AERO: 2.0000 | INHALATION_SPRAY | Freq: Two times a day (BID) | RESPIRATORY_TRACT | 0 refills | Status: DC

## 2018-02-19 MED ORDER — AZITHROMYCIN 500 MG PO TABS: 500.0000 mg | ORAL_TABLET | Freq: Every day | ORAL | 0 refills | Status: AC

## 2018-02-19 MED ORDER — PREDNISONE 10 MG (21) PO TBPK: ORAL_TABLET | ORAL | 0 refills | Status: DC

## 2018-02-19 MED ORDER — ESCITALOPRAM OXALATE 20 MG PO TABS: 20.0000 mg | ORAL_TABLET | ORAL | 0 refills | Status: DC

## 2018-02-19 NOTE — Progress Notes (Signed)
Patient discharged per MD orders. Prescriptions given to patient. All discharge instructions given and all questions answered.

## 2018-02-23 NOTE — Discharge Summary (Signed)
Ruben [name], is a 62 y.o. male  DOB 08-Jun-1955  MRN [id number].  Admission date:  02/16/2018  Admitting Physician  Max Sane, MD  Discharge Date:  02/19/2018   Primary MD  Tandy Gaw, PA  Recommendations for primary care physician for things to follow:   Follow-up with PCP in 1 week   Admission Diagnosis  COPD exacerbation ([place]) [J44.1]   Discharge Diagnosis  COPD exacerbation ([place]) [J44.1]    Active Problems:   COPD exacerbation ([place])      Past Medical History:  Diagnosis Date  . Cancer ([place])    lung cancer  . CHF (congestive heart failure) ([place])   . COPD (chronic obstructive pulmonary disease) ([place])   . Coronary artery disease   . Depression   . Hypertension   . Myocardial infarction ([place])   . Stroke [facility])     Past Surgical History:  Procedure Laterality Date  . CARDIAC CATHETERIZATION    . ELBOW SURGERY Left   . FLEXIBLE BRONCHOSCOPY Bilateral 08/27/2016   Procedure: FLEXIBLE BRONCHOSCOPY;  Surgeon: Allyne Gee, MD;  Location: [facility] ORS;  Service: Pulmonary;  Laterality: Bilateral;  . WRIST SURGERY Left        History of present illness and  Hospital Course:     Kindly see H&P for history of present illness and admission details, please review complete Labs, Consult reports and Test reports for all details in brief  HPI  from the history and physical done on the day of admission 62 year old male patient with history of COPD, CHF admitted because of progressive dyspnea, nonproductive cough, chills.  Admitted for COPD exacerbation.  Patient also has history of CAD, depression, hypertension, lung cancer.   Hospital Course   COPD exacerbation: Clinically improved, received IV steroids discharged home with steroids, bronchodilators, Z-Pak..  Chest x-ray did not show pneumonia. 2.  Essential  hypertension: Patient is on Cardizem CD 60 mg, metoprolol 50 mg p.o. twice daily. 3.  Tobacco abuse: Patient counseled against smoking 4.  Anxiety: Continue Klonopin.  he mentioned that he is not taking Eliquis, any other medication kept beta-blockers, calcium channel blockers, COPD inhalers.   Discharge Condition: Stable   Follow UP  Follow-up Information    Suvan, Stcyr, PA Follow up.   Specialty:  Family Medicine Why:  Call PCP and make that appointment tomorrow. Contact information: [place] [address] [phone]             Discharge Instructions  and  Discharge Medications      Allergies as of 02/19/2018   No Known Allergies     Medication List    STOP taking these medications   apixaban 5 MG Tabs tablet Commonly known as:  ELIQUIS   budesonide 0.5 MG/2ML nebulizer solution Commonly known as:  PULMICORT   cyclobenzaprine 5 MG tablet Commonly known as:  FLEXERIL   diltiazem 360 MG 24 hr capsule Commonly known as:  TIAZAC   Fluticasone-Salmeterol 250-50 MCG/DOSE Aepb Commonly known as:  ADVAIR   ipratropium-albuterol 0.5-2.5 (3) MG/3ML Soln Commonly known as:  DUONEB   mirtazapine 15 MG tablet Commonly known as:  REMERON   omeprazole 40 MG capsule Commonly known as:  PRILOSEC   pantoprazole 40 MG tablet Commonly known as:  PROTONIX   simvastatin 20 MG tablet Commonly known as:  ZOCOR   traZODone 150 MG tablet Commonly known as:  DESYREL     TAKE these medications   albuterol 108 (90 Base) MCG/ACT  inhaler Commonly known as:  PROVENTIL HFA;VENTOLIN HFA Inhale 2 puffs into the lungs every 6 (six) hours as needed for wheezing or shortness of breath. What changed:    when to take this  reasons to take this   atorvastatin 10 MG tablet Commonly known as:  LIPITOR Take 1 tablet (10 mg total) by mouth daily.   clonazePAM 0.5 MG tablet Commonly known as:  KLONOPIN Take 1 tablet (0.5 mg total) by mouth 2 (two)  times daily.   digoxin 0.25 MG tablet Commonly known as:  LANOXIN Take 1 tablet (0.25 mg total) by mouth daily.   diltiazem 300 MG 24 hr capsule Commonly known as:  CARDIZEM CD Take 1 capsule (300 mg total) by mouth daily. What changed:    medication strength  how much to take   escitalopram 20 MG tablet Commonly known as:  LEXAPRO Take 1 tablet (20 mg total) by mouth every morning.   metoprolol tartrate 25 MG tablet Commonly known as:  LOPRESSOR Take 1 tablet (25 mg total) by mouth 2 (two) times daily. What changed:    when to take this  Another medication with the same name was removed. Continue taking this medication, and follow the directions you see here.   mometasone-formoterol 200-5 MCG/ACT Aero Commonly known as:  DULERA Inhale 2 puffs into the lungs 2 (two) times daily.   predniSONE 10 MG (21) Tbpk tablet Commonly known as:  STERAPRED UNI-PAK 21 TAB Taper by 10 mg daily     ASK your doctor about these medications   azithromycin 500 MG tablet Commonly known as:  ZITHROMAX Take 1 tablet (500 mg total) by mouth daily for 3 days. Take 1 tablet daily for 3 days. Ask about: Should I take this medication?         Diet and Activity recommendation: See Discharge Instructions above   Consults obtained -none   Major procedures and Radiology Reports - PLEASE review detailed and final reports for all details, in brief -     Dg Chest 2 View  Result Date: 02/16/2018 CLINICAL DATA:  Shortness of breath. History of COPD and CHF. Lung mass. EXAM: CHEST - 2 VIEW COMPARISON:  CT chest 05/30/2017.  Chest 05/29/2017. FINDINGS: Normal heart size and pulmonary vascularity. Emphysematous changes and scattered fibrosis in the lungs. Central interstitial pattern and peribronchial thickening consistent with chronic bronchitis. No airspace disease or consolidation. No blunting of costophrenic angles. No pneumothorax. Mediastinal contours appear intact. IMPRESSION:  Emphysematous and chronic bronchitic changes in the lungs. No evidence of active pulmonary disease. Electronically Signed   By: Lucienne Capers M.D.   On: 02/16/2018 06:31    Micro Results     No results found for this or any previous visit (from the past 240 hour(s)).     Today   Subjective:   Manu Rubey today has no headache,no chest abdominal pain,no new weakness tingling or numbness, feels much better wants to go home today.   Objective:   Blood pressure (!) 153/71, pulse 66, temperature 98 F (36.7 C), temperature source Oral, resp. rate 18, height _0  (1.778 m), weight 70.8 kg, SpO2 94 %.  No intake or output data in the 24 hours ending 02/23/18 1447  Exam Awake Alert, Oriented x 3, No new F.N deficits, Normal affect [place].AT,PERRAL Supple Neck,No JVD, No cervical lymphadenopathy appriciated.  Symmetrical Chest wall movement, Good air movement bilaterally, CTAB RRR,No Gallops,Rubs or new Murmurs, No Parasternal Heave +ve B.Sounds, Abd Soft, Non tender, No organomegaly  appriciated, No rebound -guarding or rigidity. No Cyanosis, Clubbing or edema, No new Rash or bruise  Data Review   CBC w Diff:  Lab Results  Component Value Date   WBC 15.6 (H) 02/18/2018   HGB 13.1 02/18/2018   HGB 15.1 06/01/2013   HCT 40.5 02/18/2018   HCT 44.5 06/01/2013   PLT 254 02/18/2018   PLT 147 (L) 06/01/2013   LYMPHOPCT 19 02/16/2018   LYMPHOPCT 9.3 06/01/2013   MONOPCT 9 02/16/2018   MONOPCT 10.6 06/01/2013   EOSPCT 1 02/16/2018   EOSPCT 0.3 06/01/2013   BASOPCT 1 02/16/2018   BASOPCT 0.7 06/01/2013    CMP:  Lab Results  Component Value Date   NA 139 02/18/2018   NA 137 06/02/2013   K 4.2 02/18/2018   K 4.4 06/02/2013   CL 106 02/18/2018   CL 103 06/02/2013   CO2 28 02/18/2018   CO2 33 (H) 06/02/2013   BUN 22 02/18/2018   BUN 12 06/02/2013   CREATININE 0.88 02/18/2018   CREATININE 0.78 06/02/2013   PROT 7.3 05/29/2017   PROT 8.0 06/01/2013   ALBUMIN 4.0  05/29/2017   ALBUMIN 3.6 06/01/2013   BILITOT 0.7 05/29/2017   BILITOT 0.3 06/01/2013   ALKPHOS 59 05/29/2017   ALKPHOS 101 06/01/2013   AST 15 05/29/2017   AST 32 06/01/2013   ALT 8 (L) 05/29/2017   ALT 18 06/01/2013  .   Total Time in preparing paper work, data evaluation and todays exam - 35 minutes  Epifanio Lesches M.D on 02/19/2018 at 2:47 PM    Note: This dictation was prepared with Dragon dictation along with smaller phrase technology. Any transcriptional errors that result from this process are unintentional.

## 2018-02-27 ENCOUNTER — Telehealth: Payer: Self-pay

## 2018-02-27 NOTE — Telephone Encounter (Signed)
EMMI Follow-up: Noted on the report that the patient had unfilled Rx and no follow-up appointment scheduled.  He did pick up his Rx's but still needs to make follow-up appointment with PCP.  Plans to do soon. No other needs noted.

## 2018-04-25 ENCOUNTER — Encounter: Payer: Self-pay | Admitting: Emergency Medicine

## 2018-04-25 ENCOUNTER — Other Ambulatory Visit: Payer: Self-pay

## 2018-04-25 ENCOUNTER — Inpatient Hospital Stay
Admission: EM | Admit: 2018-04-25 | Discharge: 2018-04-28 | DRG: 871 | Disposition: A | Discharge status: Home / Self Care | Payer: Medicaid Other | Attending: Internal Medicine | Admitting: Internal Medicine

## 2018-04-25 ENCOUNTER — Emergency Department: Payer: Medicaid Other

## 2018-04-25 DIAGNOSIS — I2510 Atherosclerotic heart disease of native coronary artery without angina pectoris: Secondary | ICD-10-CM | POA: Diagnosis present

## 2018-04-25 DIAGNOSIS — I252 Old myocardial infarction: Secondary | ICD-10-CM

## 2018-04-25 DIAGNOSIS — J189 Pneumonia, unspecified organism: Secondary | ICD-10-CM | POA: Diagnosis present

## 2018-04-25 DIAGNOSIS — I110 Hypertensive heart disease with heart failure: Secondary | ICD-10-CM | POA: Diagnosis present

## 2018-04-25 DIAGNOSIS — I5033 Acute on chronic diastolic (congestive) heart failure: Secondary | ICD-10-CM | POA: Diagnosis present

## 2018-04-25 DIAGNOSIS — A419 Sepsis, unspecified organism: Secondary | ICD-10-CM | POA: Diagnosis not present

## 2018-04-25 DIAGNOSIS — Z8673 Personal history of transient ischemic attack (TIA), and cerebral infarction without residual deficits: Secondary | ICD-10-CM

## 2018-04-25 DIAGNOSIS — E785 Hyperlipidemia, unspecified: Secondary | ICD-10-CM | POA: Diagnosis present

## 2018-04-25 DIAGNOSIS — Z8249 Family history of ischemic heart disease and other diseases of the circulatory system: Secondary | ICD-10-CM | POA: Diagnosis not present

## 2018-04-25 DIAGNOSIS — J441 Chronic obstructive pulmonary disease with (acute) exacerbation: Secondary | ICD-10-CM | POA: Diagnosis present

## 2018-04-25 DIAGNOSIS — J440 Chronic obstructive pulmonary disease with acute lower respiratory infection: Secondary | ICD-10-CM | POA: Diagnosis present

## 2018-04-25 DIAGNOSIS — Z85118 Personal history of other malignant neoplasm of bronchus and lung: Secondary | ICD-10-CM

## 2018-04-25 DIAGNOSIS — R0602 Shortness of breath: Secondary | ICD-10-CM | POA: Diagnosis not present

## 2018-04-25 DIAGNOSIS — F17210 Nicotine dependence, cigarettes, uncomplicated: Secondary | ICD-10-CM | POA: Diagnosis present

## 2018-04-25 DIAGNOSIS — Z66 Do not resuscitate: Secondary | ICD-10-CM | POA: Diagnosis present

## 2018-04-25 LAB — BLOOD GAS, VENOUS
ACID-BASE EXCESS: 6.3 mmol/L — AB (ref 0.0–2.0)
Bicarbonate: 32.3 mmol/L — ABNORMAL HIGH (ref 20.0–28.0)
O2 SAT: 83.8 %
PCO2 VEN: 51 mmHg (ref 44.0–60.0)
Patient temperature: 37
pH, Ven: 7.41 (ref 7.250–7.430)
pO2, Ven: 48 mmHg — ABNORMAL HIGH (ref 32.0–45.0)

## 2018-04-25 LAB — CBC WITH DIFFERENTIAL/PLATELET
Abs Immature Granulocytes: 0.05 10*3/uL (ref 0.00–0.07)
BASOS ABS: 0.1 10*3/uL (ref 0.0–0.1)
Basophils Relative: 1 %
EOS PCT: 1 %
Eosinophils Absolute: 0.1 10*3/uL (ref 0.0–0.5)
HEMATOCRIT: 41.3 % (ref 39.0–52.0)
HEMOGLOBIN: 13.5 g/dL (ref 13.0–17.0)
IMMATURE GRANULOCYTES: 0 %
LYMPHS ABS: 1.9 10*3/uL (ref 0.7–4.0)
LYMPHS PCT: 15 %
MCH: 27.3 pg (ref 26.0–34.0)
MCHC: 32.7 g/dL (ref 30.0–36.0)
MCV: 83.4 fL (ref 80.0–100.0)
Monocytes Absolute: 1.5 10*3/uL — ABNORMAL HIGH (ref 0.1–1.0)
Monocytes Relative: 11 %
NEUTROS ABS: 9.5 10*3/uL — AB (ref 1.7–7.7)
NEUTROS PCT: 72 %
NRBC: 0 % (ref 0.0–0.2)
Platelets: 304 10*3/uL (ref 150–400)
RBC: 4.95 MIL/uL (ref 4.22–5.81)
RDW: 13 % (ref 11.5–15.5)
WBC: 13.1 10*3/uL — AB (ref 4.0–10.5)

## 2018-04-25 LAB — BASIC METABOLIC PANEL
ANION GAP: 10 (ref 5–15)
BUN: 7 mg/dL — ABNORMAL LOW (ref 8–23)
CHLORIDE: 100 mmol/L (ref 98–111)
CO2: 28 mmol/L (ref 22–32)
CREATININE: 0.66 mg/dL (ref 0.61–1.24)
Calcium: 8.8 mg/dL — ABNORMAL LOW (ref 8.9–10.3)
GFR calc non Af Amer: >60 mL/min (ref >60)
Glucose, Bld: 105 mg/dL — ABNORMAL HIGH (ref 70–99)
Potassium: 3.5 mmol/L (ref 3.5–5.1)
Sodium: 138 mmol/L (ref 135–145)

## 2018-04-25 LAB — PROCALCITONIN: Procalcitonin: <0.1 ng/mL

## 2018-04-25 LAB — TSH: TSH: 2.588 u[IU]/mL (ref 0.350–4.500)

## 2018-04-25 LAB — BRAIN NATRIURETIC PEPTIDE: B NATRIURETIC PEPTIDE 5: 139 pg/mL — AB (ref 0.0–100.0)

## 2018-04-25 LAB — TROPONIN I

## 2018-04-25 LAB — INFLUENZA PANEL BY PCR (TYPE A & B)
Influenza A By PCR: NEGATIVE
Influenza B By PCR: NEGATIVE

## 2018-04-25 MED ORDER — TIOTROPIUM BROMIDE MONOHYDRATE 18 MCG IN CAPS
18.0000 ug | ORAL_CAPSULE | Freq: Every day | RESPIRATORY_TRACT | Status: DC
  Filled 2018-04-25 (×2): qty 5

## 2018-04-25 MED ORDER — ACETAMINOPHEN 325 MG PO TABS: 650.0000 mg | ORAL_TABLET | Freq: Four times a day (QID) | ORAL | Status: DC | PRN

## 2018-04-25 MED ORDER — ENOXAPARIN SODIUM 40 MG/0.4ML ~~LOC~~ SOLN
40.0000 mg | SUBCUTANEOUS | Status: DC
  Administered 2018-04-26 – 2018-04-28 (×3): 40 mg via SUBCUTANEOUS
  Filled 2018-04-25 (×3): qty 0.4

## 2018-04-25 MED ORDER — ACETAMINOPHEN 650 MG RE SUPP: 650.0000 mg | Freq: Four times a day (QID) | RECTAL | Status: DC | PRN

## 2018-04-25 MED ORDER — DIGOXIN 250 MCG PO TABS
0.2500 mg | ORAL_TABLET | Freq: Every day | ORAL | Status: DC
  Administered 2018-04-25 – 2018-04-28 (×4): 0.25 mg via ORAL
  Filled 2018-04-25 (×4): qty 1

## 2018-04-25 MED ORDER — CLONAZEPAM 0.5 MG PO TABS
0.5000 mg | ORAL_TABLET | Freq: Two times a day (BID) | ORAL | Status: DC
  Administered 2018-04-25 – 2018-04-28 (×7): 0.5 mg via ORAL
  Filled 2018-04-25 (×7): qty 1

## 2018-04-25 MED ORDER — ALBUTEROL SULFATE (2.5 MG/3ML) 0.083% IN NEBU
2.5000 mg | INHALATION_SOLUTION | RESPIRATORY_TRACT | Status: DC | PRN
  Administered 2018-04-25: 2.5 mg via RESPIRATORY_TRACT
  Filled 2018-04-25: qty 3

## 2018-04-25 MED ORDER — PREDNISONE 50 MG PO TABS
50.0000 mg | ORAL_TABLET | Freq: Every day | ORAL | Status: DC
  Administered 2018-04-26 – 2018-04-28 (×3): 50 mg via ORAL
  Filled 2018-04-25 (×3): qty 1

## 2018-04-25 MED ORDER — METOPROLOL TARTRATE 25 MG PO TABS
25.0000 mg | ORAL_TABLET | Freq: Two times a day (BID) | ORAL | Status: DC
  Administered 2018-04-25 – 2018-04-28 (×6): 25 mg via ORAL
  Filled 2018-04-25 (×6): qty 1

## 2018-04-25 MED ORDER — DOCUSATE SODIUM 100 MG PO CAPS
100.0000 mg | ORAL_CAPSULE | Freq: Two times a day (BID) | ORAL | Status: DC
  Administered 2018-04-25 – 2018-04-28 (×6): 100 mg via ORAL
  Filled 2018-04-25 (×6): qty 1

## 2018-04-25 MED ORDER — DILTIAZEM HCL ER COATED BEADS 180 MG PO CP24
300.0000 mg | ORAL_CAPSULE | Freq: Every day | ORAL | Status: DC
  Administered 2018-04-25 – 2018-04-28 (×4): 300 mg via ORAL
  Filled 2018-04-25 (×4): qty 1

## 2018-04-25 MED ORDER — IPRATROPIUM-ALBUTEROL 0.5-2.5 (3) MG/3ML IN SOLN
3.0000 mL | Freq: Four times a day (QID) | RESPIRATORY_TRACT | Status: DC
  Administered 2018-04-25 – 2018-04-28 (×13): 3 mL via RESPIRATORY_TRACT
  Filled 2018-04-25 (×13): qty 3

## 2018-04-25 MED ORDER — ONDANSETRON HCL 4 MG PO TABS: 4.0000 mg | ORAL_TABLET | Freq: Four times a day (QID) | ORAL | Status: DC | PRN

## 2018-04-25 MED ORDER — SODIUM CHLORIDE 0.9 % IV SOLN
INTRAVENOUS | Status: DC
  Administered 2018-04-25 – 2018-04-26 (×2): via INTRAVENOUS

## 2018-04-25 MED ORDER — SODIUM CHLORIDE 0.9 % IV SOLN
1.0000 g | Freq: Once | INTRAVENOUS | Status: AC
  Administered 2018-04-25: 1 g via INTRAVENOUS
  Filled 2018-04-25: qty 10

## 2018-04-25 MED ORDER — IPRATROPIUM-ALBUTEROL 0.5-2.5 (3) MG/3ML IN SOLN: 3.0000 mL | RESPIRATORY_TRACT | Status: DC | PRN

## 2018-04-25 MED ORDER — ALBUTEROL SULFATE (2.5 MG/3ML) 0.083% IN NEBU
5.0000 mg | INHALATION_SOLUTION | Freq: Once | RESPIRATORY_TRACT | Status: AC
  Administered 2018-04-25: 5 mg via RESPIRATORY_TRACT
  Filled 2018-04-25: qty 6

## 2018-04-25 MED ORDER — SODIUM CHLORIDE 0.9 % IV SOLN
500.0000 mg | Freq: Once | INTRAVENOUS | Status: AC
  Administered 2018-04-25: 500 mg via INTRAVENOUS
  Filled 2018-04-25: qty 500

## 2018-04-25 MED ORDER — ATORVASTATIN CALCIUM 10 MG PO TABS
10.0000 mg | ORAL_TABLET | Freq: Every day | ORAL | Status: DC
  Administered 2018-04-25 – 2018-04-28 (×4): 10 mg via ORAL
  Filled 2018-04-25 (×4): qty 1

## 2018-04-25 MED ORDER — ONDANSETRON HCL 4 MG/2ML IJ SOLN: 4.0000 mg | Freq: Four times a day (QID) | INTRAMUSCULAR | Status: DC | PRN

## 2018-04-25 MED ORDER — SERTRALINE HCL 50 MG PO TABS
50.0000 mg | ORAL_TABLET | Freq: Every day | ORAL | Status: DC
  Administered 2018-04-25 – 2018-04-28 (×4): 50 mg via ORAL
  Filled 2018-04-25 (×4): qty 1

## 2018-04-25 NOTE — ED Notes (Signed)
Pt provided lunch tray, no needs or c/o voiced at this time.  Pt thanked nurse for care.  Continues to await inpt bed placement, will continue to monitor.

## 2018-04-25 NOTE — ED Notes (Signed)
Called to room by pt who states to this RN that "I have been in here since 2:00, nobody has checked on me, nobody has given me anything to drink and nobody has done a damn thing for me, I'm not paying for this, are you keeping me or not."  This Rn apologized to pt for the delay and the confusion.  Explained to pt that he is admitted and that the hospital is currently full.  Also explained to pt that I have released his diet order so that he will get breakfast and that hopefully this morning there will be available beds.  Offered to get pt something to drink and asked what else could be done to make him more comfortable.  Pt is counting out loud and then states to this RN "I don't need anything, I will not be a bother to ya'll anymore."  Again explained to pt that he was not a bother and that this RN would be glad to do everything she could to make him more comfortable.  Pt does not respond to RN. Will continue to monitor pt and provide care as needed.

## 2018-04-25 NOTE — ED Triage Notes (Signed)
Pt arrived to the ED via EMS from home for complaints of SOB. Pt reports that he has a hx of COPD and for the last 4 days has not been able to feel better despite using 10-12 breathing treatments a day. Pt is AOx4 in no apparent distress sating at 92% on RA upon arrival.

## 2018-04-25 NOTE — ED Provider Notes (Signed)
[facility] Emergency Department Provider Note  ____________________________________________   First MD Initiated Contact with Patient 04/25/18 0145     (approximate)  I have reviewed the triage vital signs and the nursing notes.   HISTORY  Chief Complaint Shortness of Breath    HPI Ruben [name] is a 63 y.o. male with below list of chronic medical conditions presents to the emergency department via EMS secondary to cough fever congestion and respiratory difficulty.  Patient states that he has had progressive dyspnea x4 days with beforementioned associated symptoms.  On EMS arrival patient's oxygen saturation 89%.  Patient states that he is down approximately 12 breathing treatments for the past 4 days without improvement.  Patient received 3 duo nebs and 125 mg of IV Solu-Medrol by EMS   Past Medical History:  Diagnosis Date  . Cancer ([place])    lung cancer  . CHF (congestive heart failure) ([place])   . COPD (chronic obstructive pulmonary disease) ([place])   . Coronary artery disease   . Depression   . Hypertension   . Myocardial infarction ([place])   . Stroke [facility])     Patient Active Problem List   Diagnosis Date Noted  . COPD (chronic obstructive pulmonary disease) ([place]) 05/29/2017  . Acute respiratory failure ([place]) 04/26/2017  . Pneumonia 06/28/2015  . COPD with exacerbation ([place]) 09/25/2014  . Depression 09/25/2014  . COPD exacerbation ([place]) 09/25/2014  . Severe recurrent major depression without psychotic features ([place]) 09/25/2014    Past Surgical History:  Procedure Laterality Date  . CARDIAC CATHETERIZATION    . ELBOW SURGERY Left   . FLEXIBLE BRONCHOSCOPY Bilateral 08/27/2016   Procedure: FLEXIBLE BRONCHOSCOPY;  Surgeon: Allyne Gee, MD;  Location: [facility] ORS;  Service: Pulmonary;  Laterality: Bilateral;  . WRIST SURGERY Left     Prior to Admission medications   Medication Sig Start Date End Date Taking? Authorizing Provider  albuterol  (PROVENTIL HFA;VENTOLIN HFA) 108 (90 Base) MCG/ACT inhaler Inhale 2 puffs into the lungs every 6 (six) hours as needed for wheezing or shortness of breath. 02/19/18  Yes Epifanio Lesches, MD  atorvastatin (LIPITOR) 10 MG tablet Take 1 tablet (10 mg total) by mouth daily. 02/20/18  Yes Epifanio Lesches, MD  clonazePAM (KLONOPIN) 0.5 MG tablet Take 1 tablet (0.5 mg total) by mouth 2 (two) times daily. 02/19/18  Yes Epifanio Lesches, MD  digoxin (LANOXIN) 0.25 MG tablet Take 1 tablet (0.25 mg total) by mouth daily. 02/19/18  Yes Epifanio Lesches, MD  diltiazem (CARDIZEM CD) 300 MG 24 hr capsule Take 1 capsule (300 mg total) by mouth daily. 02/20/18  Yes Epifanio Lesches, MD  ipratropium-albuterol (DUONEB) 0.5-2.5 (3) MG/3ML SOLN Take 3 mLs by nebulization every 4 (four) hours as needed (shortness of breath).   Yes [provider]  metoprolol tartrate (LOPRESSOR) 25 MG tablet Take 1 tablet (25 mg total) by mouth 2 (two) times daily. 02/19/18  Yes Epifanio Lesches, MD  sertraline (ZOLOFT) 50 MG tablet Take 50 mg by mouth daily.   Yes [provider]  escitalopram (LEXAPRO) 20 MG tablet Take 1 tablet (20 mg total) by mouth every morning. Patient not taking: Reported on 04/25/2018 02/19/18   Epifanio Lesches, MD  mometasone-formoterol [facility]) 200-5 MCG/ACT AERO Inhale 2 puffs into the lungs 2 (two) times daily. Patient not taking: Reported on 04/25/2018 02/19/18   Epifanio Lesches, MD  predniSONE (STERAPRED UNI-PAK 21 TAB) 10 MG (21) TBPK tablet Taper by 10 mg daily Patient not taking: Reported on  04/25/2018 02/19/18   Epifanio Lesches, MD    Allergies Patient has no known allergies.  Family History  Problem Relation Age of Onset  . Heart disease Other   . Hypertension Other   . Diabetes Other   . CAD Mother     Social History Social History   Tobacco Use  . Smoking status: Current Every Day Smoker    Packs/day: 1.00    Years: 43.00     Pack years: 43.00    Types: Cigarettes  . Smokeless tobacco: Never Used  Substance Use Topics  . Alcohol use: No  . Drug use: Yes    Frequency: 4.0 times per week    Types: Marijuana    Review of Systems Constitutional: Positive for fever Eyes: No visual changes. ENT: No sore throat. Cardiovascular: Denies chest pain. Respiratory: Positive for cough and dyspnea Gastrointestinal: No abdominal pain.  No nausea, no vomiting.  No diarrhea.  No constipation. Genitourinary: Negative for dysuria. Musculoskeletal: Negative for neck pain.  Negative for back pain. Integumentary: Negative for rash. Neurological: Negative for headaches, focal weakness or numbness.   ____________________________________________   PHYSICAL EXAM:  VITAL SIGNS: ED Triage Vitals  Enc Vitals Group     BP 04/25/18 0151 135/87     Pulse Rate 04/25/18 0151 (!) 109     Resp 04/25/18 0151 (!) 22     Temp 04/25/18 0151 98.3 F (36.8 C)     Temp Source 04/25/18 0151 Oral     SpO2 04/25/18 0151 93 %     Weight 04/25/18 0152 71.7 kg (158 lb)     Height 04/25/18 0152 1.727 m (_0 )     Head Circumference --      Peak Flow --      Pain Score 04/25/18 0152 0     Pain Loc --      Pain Edu? --      Excl. in [place]? --     Constitutional: Alert and oriented.  Apparent respiratory difficulty Eyes: Conjunctivae are normal.  Mouth/Throat: Mucous membranes are moist. Oropharynx non-erythematous. Neck: No stridor.  Cardiovascular: Normal rate, regular rhythm. Good peripheral circulation. Grossly normal heart sounds. Respiratory: Tachypnea, positive accessory respiratory muscle use, diffuse rhonchi Gastrointestinal: Soft and nontender. No distention.  Musculoskeletal: No lower extremity tenderness nor edema. No gross deformities of extremities. Neurologic:  Normal speech and language. No gross focal neurologic deficits are appreciated.  Skin:  Skin is warm, dry and intact. No rash noted. Psychiatric: Mood and affect  are normal. Speech and behavior are normal.  ____________________________________________   LABS (all labs ordered are listed, but only abnormal results are displayed)  Labs Reviewed  CBC WITH DIFFERENTIAL/PLATELET - Abnormal; Notable for the following components:      Result Value   WBC 13.1 (*)    Neutro Abs 9.5 (*)    Monocytes Absolute 1.5 (*)    All other components within normal limits  BASIC METABOLIC PANEL - Abnormal; Notable for the following components:   Glucose, Bld 105 (*)    BUN 7 (*)    Calcium 8.8 (*)    All other components within normal limits  BRAIN NATRIURETIC PEPTIDE - Abnormal; Notable for the following components:   B Natriuretic Peptide 139.0 (*)    All other components within normal limits  BLOOD GAS, VENOUS - Abnormal; Notable for the following components:   pO2, Ven 48.0 (*)    Bicarbonate 32.3 (*)    Acid-Base Excess 6.3 (*)  All other components within normal limits  TROPONIN I   ____________________________________________  EKG  ED ECG REPORT I, [place] N Mera Gunkel, the attending physician, personally viewed and interpreted this ECG.   Date: 04/25/2018  EKG Time: 1:49 AM  Rate: 113  Rhythm: Sinus tachycardia  Axis: Normal  Intervals: Normal  ST&T Change: None  ____________________________________________  RADIOLOGY I, [place] N Chelise Hanger, personally viewed and evaluated these images (plain radiographs) as part of my medical decision making, as well as reviewing the written report by the radiologist.  ED MD interpretation: Emphysematous and hyperinflation basilar reticular opacities  Official radiology report(s): Dg Chest Portable 1 View  Result Date: 04/25/2018 CLINICAL DATA:  63 y/o  M; shortness of breath. EXAM: PORTABLE CHEST 1 VIEW COMPARISON:  02/16/2018 chest radiograph FINDINGS: Normal cardiac silhouette. Aortic atherosclerosis with calcification. Emphysema and hyperinflation. Mild basilar reticular opacities. No focal  consolidation. No pleural effusion or pneumothorax. No acute osseous abnormality is evident. IMPRESSION: Emphysema and hyperinflation. Mild basilar reticular opacities, possibly bronchitic changes or interstitial edema. No focal consolidation. Electronically Signed   By: Kristine Garbe M.D.   On: 04/25/2018 02:09      Critical Care performed:  .Critical Care Performed by: Gregor Hams, MD Authorized by: Gregor Hams, MD   Critical care provider statement:    Critical care time (minutes):  30   Critical care time was exclusive of:  Separately billable procedures and treating other patients and teaching time   Critical care was necessary to treat or prevent imminent or life-threatening deterioration of the following conditions:  Respiratory failure   Critical care was time spent personally by me on the following activities:  Development of treatment plan with patient or surrogate, discussions with consultants, evaluation of patient's response to treatment, examination of patient, obtaining history from patient or surrogate, ordering and performing treatments and interventions, ordering and review of laboratory studies, ordering and review of radiographic studies, pulse oximetry, re-evaluation of patient's condition and review of old charts   I assumed direction of critical care for this patient from another provider in my specialty: no       ____________________________________________   INITIAL IMPRESSION / ASSESSMENT AND PLAN / ED COURSE  As part of my medical decision making, I reviewed the following data within the electronic MEDICAL RECORD NUMBER   63 year old male presenting with above-stated history and physical exam secondary to respiratory distress.  Concern for possible pneumonia COPD exacerbation.  Chest x-ray and clinical exam concerning for pneumonia.  Patient given additional albuterol breathing treatment here in emergency department as well as IV ceftriaxone and  azithromycin.  Patient discussed with Dr. Marcille Blanco for hospital admission for further evaluation and management. ____________________________________________  FINAL CLINICAL IMPRESSION(S) / ED DIAGNOSES  Final diagnoses:  COPD exacerbation ([place])  Community acquired pneumonia, unspecified laterality     MEDICATIONS GIVEN DURING THIS VISIT:  Medications  cefTRIAXone (ROCEPHIN) 1 g in sodium chloride 0.9 % 100 mL IVPB (0 g Intravenous Stopped 04/25/18 0442)  azithromycin (ZITHROMAX) 500 mg in sodium chloride 0.9 % 250 mL IVPB (0 mg Intravenous Stopped 04/25/18 0442)  albuterol (PROVENTIL) (2.5 MG/3ML) 0.083% nebulizer solution 5 mg (5 mg Nebulization Given 04/25/18 0253)     ED Discharge Orders    None       Note:  This document was prepared using Dragon voice recognition software and may include unintentional dictation errors.    Gregor Hams, MD 04/25/18 [phone]

## 2018-04-25 NOTE — H&P (Addendum)
Ruben [name] is an 63 y.o. male.   Chief Complaint: Shortness of breath HPI: The patient with past medical history of COPD, CHF, hypertension, CAD status post MI and stroke as well as lung cancer presents to the emergency department complaining of shortness of breath.  The patient states that he has had cough, fever and shortness of breath x4 days.  He has been using his albuterol inhaler nearly hourly today with little relief.  Upon arrival to the emergency department the patient was afebrile but mildly tachypneic and wheezing throughout.  He received Solu-Medrol as well as multiple DuoNeb treatments.  Chest x-ray showed only bronchitic changes and hyperinflation however the patient clinically appeared to have pneumonia which prompted the emergency department staff to call the hospitalist service for admission.  Past Medical History:  Diagnosis Date  . Cancer ([place])    lung cancer  . CHF (congestive heart failure) ([place])   . COPD (chronic obstructive pulmonary disease) ([place])   . Coronary artery disease   . Depression   . Hypertension   . Myocardial infarction ([place])   . Stroke [facility])     Past Surgical History:  Procedure Laterality Date  . CARDIAC CATHETERIZATION    . ELBOW SURGERY Left   . FLEXIBLE BRONCHOSCOPY Bilateral 08/27/2016   Procedure: FLEXIBLE BRONCHOSCOPY;  Surgeon: Allyne Gee, MD;  Location: [facility] ORS;  Service: Pulmonary;  Laterality: Bilateral;  . WRIST SURGERY Left     Family History  Problem Relation Age of Onset  . Heart disease Other   . Hypertension Other   . Diabetes Other   . CAD Mother    Social History:  reports that he has been smoking cigarettes. He has a 43.00 pack-year smoking history. He has never used smokeless tobacco. He reports current drug use. Frequency: 4.00 times per week. Drug: Marijuana. He reports that he does not drink alcohol.  Allergies: No Known Allergies  Prior to Admission medications   Medication Sig Start Date End Date Taking?  Authorizing Provider  albuterol (PROVENTIL HFA;VENTOLIN HFA) 108 (90 Base) MCG/ACT inhaler Inhale 2 puffs into the lungs every 6 (six) hours as needed for wheezing or shortness of breath. 02/19/18  Yes Epifanio Lesches, MD  atorvastatin (LIPITOR) 10 MG tablet Take 1 tablet (10 mg total) by mouth daily. 02/20/18  Yes Epifanio Lesches, MD  clonazePAM (KLONOPIN) 0.5 MG tablet Take 1 tablet (0.5 mg total) by mouth 2 (two) times daily. 02/19/18  Yes Epifanio Lesches, MD  digoxin (LANOXIN) 0.25 MG tablet Take 1 tablet (0.25 mg total) by mouth daily. 02/19/18  Yes Epifanio Lesches, MD  diltiazem (CARDIZEM CD) 300 MG 24 hr capsule Take 1 capsule (300 mg total) by mouth daily. 02/20/18  Yes Epifanio Lesches, MD  ipratropium-albuterol (DUONEB) 0.5-2.5 (3) MG/3ML SOLN Take 3 mLs by nebulization every 4 (four) hours as needed (shortness of breath).   Yes [provider]  metoprolol tartrate (LOPRESSOR) 25 MG tablet Take 1 tablet (25 mg total) by mouth 2 (two) times daily. 02/19/18  Yes Epifanio Lesches, MD  sertraline (ZOLOFT) 50 MG tablet Take 50 mg by mouth daily.   Yes [provider]  escitalopram (LEXAPRO) 20 MG tablet Take 1 tablet (20 mg total) by mouth every morning. Patient not taking: Reported on 04/25/2018 02/19/18   Epifanio Lesches, MD  mometasone-formoterol [facility]) 200-5 MCG/ACT AERO Inhale 2 puffs into the lungs 2 (two) times daily. Patient not taking: Reported on 04/25/2018 02/19/18   Epifanio Lesches, MD  predniSONE [facility] Eleanora Neighbor  21 TAB) 10 MG (21) TBPK tablet Taper by 10 mg daily Patient not taking: Reported on 04/25/2018 02/19/18   Epifanio Lesches, MD     Results for orders placed or performed during the hospital encounter of 04/25/18 (from the past 48 hour(s))  CBC with Differential     Status: Abnormal   Collection Time: 04/25/18  1:48 AM  Result Value Ref Range   WBC 13.1 (H) 4.0 - 10.5 K/uL   RBC 4.95 4.22 - 5.81 MIL/uL    Hemoglobin 13.5 13.0 - 17.0 g/dL   HCT 41.3 39.0 - 52.0 %   MCV 83.4 80.0 - 100.0 fL   MCH 27.3 26.0 - 34.0 pg   MCHC 32.7 30.0 - 36.0 g/dL   RDW 13.0 11.5 - 15.5 %   Platelets 304 150 - 400 K/uL   nRBC 0.0 0.0 - 0.2 %   Neutrophils Relative % 72 %   Neutro Abs 9.5 (H) 1.7 - 7.7 K/uL   Lymphocytes Relative 15 %   Lymphs Abs 1.9 0.7 - 4.0 K/uL   Monocytes Relative 11 %   Monocytes Absolute 1.5 (H) 0.1 - 1.0 K/uL   Eosinophils Relative 1 %   Eosinophils Absolute 0.1 0.0 - 0.5 K/uL   Basophils Relative 1 %   Basophils Absolute 0.1 0.0 - 0.1 K/uL   Immature Granulocytes 0 %   Abs Immature Granulocytes 0.05 0.00 - 0.07 K/uL    Comment: Performed at [facility], [address] [place], [place] [address]  Basic metabolic panel     Status: Abnormal   Collection Time: 04/25/18  1:48 AM  Result Value Ref Range   Sodium 138 135 - 145 mmol/L   Potassium 3.5 3.5 - 5.1 mmol/L   Chloride 100 98 - 111 mmol/L   CO2 28 22 - 32 mmol/L   Glucose, Bld 105 (H) 70 - 99 mg/dL   BUN 7 (L) 8 - 23 mg/dL   Creatinine, Ser 0.66 0.61 - 1.24 mg/dL   Calcium 8.8 (L) 8.9 - 10.3 mg/dL   GFR calc non Af Amer >60 >60 mL/min   GFR calc Af Amer >60 >60 mL/min   Anion gap 10 5 - 15    Comment: Performed at [facility], [address] [place], [place] [address]  Brain natriuretic peptide     Status: Abnormal   Collection Time: 04/25/18  1:48 AM  Result Value Ref Range   B Natriuretic Peptide 139.0 (H) 0.0 - 100.0 pg/mL    Comment: Performed at [facility], [address] [place], [place] [address]  Troponin I - Once     Status: None   Collection Time: 04/25/18  1:48 AM  Result Value Ref Range   Troponin I <0.03 <0.03 ng/mL    Comment: Performed at [facility], [address] [place], [place] [address]  Blood gas, venous     Status: Abnormal   Collection Time: 04/25/18  1:58 AM  Result Value Ref Range   pH, Ven 7.41 7.250 - 7.430   pCO2, Ven 51 44.0 - 60.0  mmHg   pO2, Ven 48.0 (H) 32.0 - 45.0 mmHg   Bicarbonate 32.3 (H) 20.0 - 28.0 mmol/L   Acid-Base Excess 6.3 (H) 0.0 - 2.0 mmol/L   O2 Saturation 83.8 %   Patient temperature 37.0    Collection site VEIN    Sample type VENOUS     Comment: Performed at [facility], [address] [place],  [place] [address]   Dg Chest Portable 1 View  Result Date: 04/25/2018 CLINICAL DATA:  63 y/o  M; shortness of breath. EXAM: PORTABLE CHEST 1 VIEW COMPARISON:  02/16/2018 chest radiograph FINDINGS: Normal cardiac silhouette. Aortic atherosclerosis with calcification. Emphysema and hyperinflation. Mild basilar reticular opacities. No focal consolidation. No pleural effusion or pneumothorax. No acute osseous abnormality is evident. IMPRESSION: Emphysema and hyperinflation. Mild basilar reticular opacities, possibly bronchitic changes or interstitial edema. No focal consolidation. Electronically Signed   By: Kristine Garbe M.D.   On: 04/25/2018 02:09    Review of Systems  Constitutional: Negative for chills and fever.  HENT: Negative for sore throat and tinnitus.   Eyes: Negative for blurred vision and redness.  Respiratory: Positive for cough, shortness of breath and wheezing.   Cardiovascular: Negative for chest pain, palpitations, orthopnea and PND.  Gastrointestinal: Negative for abdominal pain, diarrhea, nausea and vomiting.  Genitourinary: Negative for dysuria, frequency and urgency.  Musculoskeletal: Negative for joint pain and myalgias.  Skin: Negative for rash.       No lesions  Neurological: Negative for speech change, focal weakness and weakness.  Endo/Heme/Allergies: Does not bruise/bleed easily.       No temperature intolerance  Psychiatric/Behavioral: Negative for depression and suicidal ideas.    Blood pressure (!) 152/87, pulse 100, temperature 98.3 F (36.8 C), temperature source Oral, resp. rate 20, height _0  (1.727 m), weight 71.7 kg, SpO2 94 %. Physical Exam   Vitals reviewed. Constitutional: He is oriented to person, place, and time. He appears well-developed and well-nourished. No distress.  HENT:  Head: Normocephalic and atraumatic.  Mouth/Throat: Oropharynx is clear and moist.  Eyes: Pupils are equal, round, and reactive to light. Conjunctivae and EOM are normal. No scleral icterus.  Neck: Normal range of motion. Neck supple. No JVD present. No tracheal deviation present. No thyromegaly present.  Cardiovascular: Regular rhythm and normal heart sounds. Tachycardia present. Exam reveals no gallop and no friction rub.  No murmur heard. Respiratory: Effort normal. No respiratory distress. He has wheezes.  GI: Soft. Bowel sounds are normal. He exhibits no distension. There is no abdominal tenderness.  Genitourinary:    Genitourinary Comments: Deferred   Musculoskeletal: Normal range of motion.        General: No edema.  Lymphadenopathy:    He has no cervical adenopathy.  Neurological: He is alert and oriented to person, place, and time. No cranial nerve deficit.  Skin: Skin is warm and dry. No rash noted. No erythema.  Psychiatric: He has a normal mood and affect. His behavior is normal. Judgment and thought content normal.     Assessment/Plan This is a 63 year old male admitted for pneumonia. 1.  Pneumonia: Healthcare associated; oxygen saturations 88 to 92% on 2 L/min of supplemental oxygen.  The patient has received ceftriaxone and azithromycin.  Will increase coverage to include Levaquin and vancomycin.  No infiltrate seen on chest x-ray and once patient clears criteria for sepsis we can narrow antibiotic coverage.  Obtain sputum sample if possible. 2.  Sepsis: The patient meets criteria via leukocytosis, tachypnea, tachycardia and reported fever.  Follow blood cultures for growth and sensitivities.  The patient is hemodynamically stable. 3.  COPD: With exacerbation.  Continue steroid taper as well as inhaled corticosteroid.  Add Spiriva.   Albuterol as needed. 4.  Hypertension: Uncontrolled; continue diltiazem and metoprolol.  Labetalol as needed 5.  CHF: Stable; diastolic.  Grade 1 diastolic dysfunction with preserved ejection fraction on last echocardiogram dated May 2018.  Continue digoxin 6.  Hyperlipidemia: Continue statin therapy 7.  DVT prophylaxis: Lovenox 8.  GI prophylaxis: None The patient is a full code.  Time spent on admission orders and patient care approximately 45 minutes  Harrie Foreman, MD 04/25/2018, 6:27 AM

## 2018-04-25 NOTE — ED Notes (Signed)
Called to room by pt who apologized for his frustration and behavior.  This RN informed pt to not worry and that the staff feels his frustration.  Again informed pt that I would do all in my power to make him comfortable.  Pt provided with TV remote.  Pt requesting coffee, informed that I would get him some as soon as I was available.

## 2018-04-25 NOTE — Progress Notes (Signed)
Advance care planning  Purpose of Encounter COPD  Parties in Attendance Patient  Patients Decisional capacity Alert and oriented.  Able to make medical decisions. No documented healthcare power of attorney or advanced directives in place.  Lives with wife and she would be the healthcare decision maker if he is unable to.  Discussed regarding COPD, treatment plan and prognosis.  Procalcitonin normal.  Antibiotics stopped.  Not pneumonia.  CODE STATUS discussed and patient wants to be a full code.  Orders entered  Time spent - 17 minutes

## 2018-04-25 NOTE — ED Notes (Signed)
Called to room by pt who is again fussing at RN, states he has not been seen by a physician and that nobody has check on him.  Reminded pt that he has been seen by a MD three different times and that I have checked on him several times.  Pt states "they didn't do a damn thing for me, you didn't come when I called you just now."  Pt informed that RN has attempted to assist him in any way possible.  Also informed that she was with another pt and would be back with him in a few minutes.  Pt states "there's no need to come back in here."

## 2018-04-25 NOTE — ED Notes (Signed)
Report received, care of pt assumed.  Pt resting on ER stretcher requesting urinal.  Urinal provided, pt also states that he "needs a room" as he has been in ER since 2AM.  Pt informed of reason for delay and that room has been requested.

## 2018-04-26 MED ORDER — POTASSIUM CHLORIDE CRYS ER 20 MEQ PO TBCR
40.0000 meq | EXTENDED_RELEASE_TABLET | Freq: Once | ORAL | Status: AC
  Administered 2018-04-26: 40 meq via ORAL
  Filled 2018-04-26: qty 2

## 2018-04-26 MED ORDER — HYDROCOD POLST-CPM POLST ER 10-8 MG/5ML PO SUER
5.0000 mL | Freq: Two times a day (BID) | ORAL | Status: DC | PRN
  Administered 2018-04-26: 5 mL via ORAL
  Filled 2018-04-26: qty 5

## 2018-04-26 MED ORDER — FUROSEMIDE 10 MG/ML IJ SOLN
60.0000 mg | Freq: Once | INTRAMUSCULAR | Status: AC
  Administered 2018-04-26: 60 mg via INTRAVENOUS
  Filled 2018-04-26: qty 6

## 2018-04-26 MED ORDER — NICOTINE 14 MG/24HR TD PT24
14.0000 mg | MEDICATED_PATCH | Freq: Every day | TRANSDERMAL | Status: DC
  Administered 2018-04-26 – 2018-04-28 (×3): 14 mg via TRANSDERMAL
  Filled 2018-04-26 (×3): qty 1

## 2018-04-26 MED ORDER — SODIUM CHLORIDE 0.9% FLUSH
3.0000 mL | Freq: Two times a day (BID) | INTRAVENOUS | Status: DC
  Administered 2018-04-26 – 2018-04-28 (×4): 3 mL via INTRAVENOUS

## 2018-04-26 NOTE — Progress Notes (Signed)
[facility] [place] NAME: Ruben [name]    MR#:  [id number]  DATE OF BIRTH:  07-06-1955  SUBJECTIVE:  CHIEF COMPLAINT:   Chief Complaint  Patient presents with  . Shortness of Breath   Still has SOB and wheezing. Anxious.  REVIEW OF SYSTEMS:    Review of Systems  Constitutional: Positive for malaise/fatigue. Negative for chills and fever.  HENT: Negative for sore throat.   Eyes: Negative for blurred vision, double vision and pain.  Respiratory: Negative for cough, hemoptysis, shortness of breath and wheezing.   Cardiovascular: Negative for chest pain, palpitations, orthopnea and leg swelling.  Gastrointestinal: Negative for abdominal pain, constipation, diarrhea, heartburn, nausea and vomiting.  Genitourinary: Negative for dysuria and hematuria.  Musculoskeletal: Negative for back pain and joint pain.  Skin: Negative for rash.  Neurological: Negative for sensory change, speech change, focal weakness and headaches.  Endo/Heme/Allergies: Does not bruise/bleed easily.  Psychiatric/Behavioral: Negative for depression. The patient is not nervous/anxious.     DRUG ALLERGIES:  No Known Allergies  VITALS:  Blood pressure (!) 146/79, pulse 78, temperature (!) 97.5 F (36.4 C), resp. rate 20, height 5\' 8"  (1.727 m), weight 63.3 kg, SpO2 97 %.  PHYSICAL EXAMINATION:   Physical Exam  GENERAL:  63 y.o.-year-old patient lying in the bed .  Conversational dyspnea EYES: Pupils equal, round, reactive to light and accommodation. No scleral icterus. Extraocular muscles intact.  HEENT: Head atraumatic, normocephalic. Oropharynx and nasopharynx clear.  NECK:  Supple, no jugular venous distention. No thyroid enlargement, no tenderness.  LUNGS: Bilateral wheezing and decreased air entry CARDIOVASCULAR: S1, S2 normal. No murmurs, rubs, or gallops.  ABDOMEN: Soft, nontender, nondistended. Bowel sounds present. No organomegaly or mass.  EXTREMITIES:  No cyanosis, clubbing or edema b/l.    NEUROLOGIC: Cranial nerves II through XII are intact. No focal Motor or sensory deficits b/l.   PSYCHIATRIC: The patient is alert and oriented x 3.  Anxious SKIN: No obvious rash, lesion, or ulcer.   LABORATORY PANEL:   CBC Recent Labs  Lab 04/25/18 0148  WBC 13.1*  HGB 13.5  HCT 41.3  PLT 304   ------------------------------------------------------------------------------------------------------------------ Chemistries  Recent Labs  Lab 04/25/18 0148  NA 138  K 3.5  CL 100  CO2 28  GLUCOSE 105*  BUN 7*  CREATININE 0.66  CALCIUM 8.8*   ------------------------------------------------------------------------------------------------------------------  Cardiac Enzymes Recent Labs  Lab 04/25/18 0148  TROPONINI <0.03   ------------------------------------------------------------------------------------------------------------------  RADIOLOGY:  Dg Chest Portable 1 View  Result Date: 04/25/2018 CLINICAL DATA:  63 y/o  M; shortness of breath. EXAM: PORTABLE CHEST 1 VIEW COMPARISON:  02/16/2018 chest radiograph FINDINGS: Normal cardiac silhouette. Aortic atherosclerosis with calcification. Emphysema and hyperinflation. Mild basilar reticular opacities. No focal consolidation. No pleural effusion or pneumothorax. No acute osseous abnormality is evident. IMPRESSION: Emphysema and hyperinflation. Mild basilar reticular opacities, possibly bronchitic changes or interstitial edema. No focal consolidation. Electronically Signed   By: Kristine Garbe M.D.   On: 04/25/2018 02:09     ASSESSMENT AND PLAN:   *Acute COPD exacerbation -IV steroids - Scheduled Nebulizers - Inhalers -Wean O2 as tolerated - Consult pulmonary if no improvement  *Acute on chronic diastolic congestive heart failure We will give 2 dose of IV Lasix today and reassess in the morning. Monitor input and output.  Replace potassium.  Repeat BMP.  *Tobacco  abuse.  Counseled to quit greater than 3 minutes.  *DVT prophylaxis with Lovenox  All the records are reviewed  and case discussed with Care Management/Social Worker Management plans discussed with the patient, family and they are in agreement.  CODE STATUS: FULL CODE  DVT Prophylaxis: SCDs  TOTAL TIME TAKING CARE OF THIS PATIENT: 35 minutes.   POSSIBLE D/C IN 1-2 DAYS, DEPENDING ON CLINICAL CONDITION.  [name] M.D on 04/26/2018 at 1:32 PM  Between 7am to 6pm - Pager - [phone]  After 6pm go to [email or web address] - password [email or web address] [facility]  Office  [phone]  CC: Primary care physician; Tandy Gaw, PA  Note: This dictation was prepared with Dragon dictation along with smaller phrase technology. Any transcriptional errors that result from this process are unintentional.

## 2018-04-27 LAB — BASIC METABOLIC PANEL
Anion gap: 9 (ref 5–15)
BUN: 22 mg/dL (ref 8–23)
CO2: 33 mmol/L — ABNORMAL HIGH (ref 22–32)
CREATININE: 0.7 mg/dL (ref 0.61–1.24)
Calcium: 8.9 mg/dL (ref 8.9–10.3)
Chloride: 98 mmol/L (ref 98–111)
GFR calc Af Amer: >60 mL/min (ref >60)
GFR calc non Af Amer: >60 mL/min (ref >60)
Glucose, Bld: 108 mg/dL — ABNORMAL HIGH (ref 70–99)
Potassium: 3.8 mmol/L (ref 3.5–5.1)
Sodium: 140 mmol/L (ref 135–145)

## 2018-04-27 LAB — URINE DRUG SCREEN, QUALITATIVE (ARMC ONLY)
Amphetamines, Ur Screen: NOT DETECTED
BARBITURATES, UR SCREEN: NOT DETECTED
Benzodiazepine, Ur Scrn: NOT DETECTED
COCAINE METABOLITE, UR ~~LOC~~: NOT DETECTED
Cannabinoid 50 Ng, Ur ~~LOC~~: POSITIVE — AB
MDMA (Ecstasy)Ur Screen: NOT DETECTED
Methadone Scn, Ur: NOT DETECTED
Opiate, Ur Screen: POSITIVE — AB
Phencyclidine (PCP) Ur S: NOT DETECTED
Tricyclic, Ur Screen: NOT DETECTED

## 2018-04-27 MED ORDER — FUROSEMIDE 10 MG/ML IJ SOLN
60.0000 mg | Freq: Once | INTRAMUSCULAR | Status: AC
  Administered 2018-04-27: 60 mg via INTRAVENOUS
  Filled 2018-04-27: qty 6

## 2018-04-27 MED ORDER — POTASSIUM CHLORIDE CRYS ER 20 MEQ PO TBCR
40.0000 meq | EXTENDED_RELEASE_TABLET | Freq: Once | ORAL | Status: AC
  Administered 2018-04-27: 40 meq via ORAL
  Filled 2018-04-27: qty 2

## 2018-04-27 MED ORDER — ADULT MULTIVITAMIN W/MINERALS CH
1.0000 | ORAL_TABLET | Freq: Every day | ORAL | Status: DC
  Administered 2018-04-27 – 2018-04-28 (×2): 1 via ORAL
  Filled 2018-04-27 (×2): qty 1

## 2018-04-27 MED ORDER — ENSURE ENLIVE PO LIQD
237.0000 mL | Freq: Three times a day (TID) | ORAL | Status: DC
  Administered 2018-04-27 – 2018-04-28 (×3): 237 mL via ORAL

## 2018-04-27 MED ORDER — PANTOPRAZOLE SODIUM 40 MG PO TBEC
40.0000 mg | DELAYED_RELEASE_TABLET | Freq: Every day | ORAL | Status: DC
  Administered 2018-04-27 – 2018-04-28 (×2): 40 mg via ORAL
  Filled 2018-04-27 (×2): qty 1

## 2018-04-27 NOTE — Progress Notes (Signed)
[name] [place] NAME: Ruben [name]    MR#:  [id number]  DATE OF BIRTH:  06-08-1955  SUBJECTIVE:  CHIEF COMPLAINT:   Chief Complaint  Patient presents with  . Shortness of Breath   Continues to have some shortness of breath but improving.  Dry cough.  Increased urine output.  REVIEW OF SYSTEMS:    Review of Systems  Constitutional: Positive for malaise/fatigue. Negative for chills and fever.  HENT: Negative for sore throat.   Eyes: Negative for blurred vision, double vision and pain.  Respiratory: Positive for cough, shortness of breath and wheezing. Negative for hemoptysis.   Cardiovascular: Negative for chest pain, palpitations, orthopnea and leg swelling.  Gastrointestinal: Negative for abdominal pain, constipation, diarrhea, heartburn, nausea and vomiting.  Genitourinary: Negative for dysuria and hematuria.  Musculoskeletal: Negative for back pain and joint pain.  Skin: Negative for rash.  Neurological: Negative for sensory change, speech change, focal weakness and headaches.  Endo/Heme/Allergies: Does not bruise/bleed easily.  Psychiatric/Behavioral: Negative for depression. The patient is not nervous/anxious.     DRUG ALLERGIES:  No Known Allergies  VITALS:  Blood pressure 136/71, pulse 80, temperature (!) 97.5 F (36.4 C), temperature source Oral, resp. rate 20, height 5\' 8"  (1.727 m), weight 63.1 kg, SpO2 100 %.  PHYSICAL EXAMINATION:   Physical Exam  GENERAL:  63 y.o.-year-old patient lying in the bed .  EYES: Pupils equal, round, reactive to light and accommodation. No scleral icterus. Extraocular muscles intact.  HEENT: Head atraumatic, normocephalic. Oropharynx and nasopharynx clear.  NECK:  Supple, no jugular venous distention. No thyroid enlargement, no tenderness.  LUNGS: Bilateral wheezing  CARDIOVASCULAR: S1, S2 normal. No murmurs, rubs, or gallops.  ABDOMEN: Soft, nontender, nondistended. Bowel sounds  present. No organomegaly or mass.  EXTREMITIES: No cyanosis, clubbing or edema b/l.    NEUROLOGIC: Cranial nerves II through XII are intact. No focal Motor or sensory deficits b/l.   PSYCHIATRIC: The patient is alert and oriented x 3.  Anxious SKIN: No obvious rash, lesion, or ulcer.   LABORATORY PANEL:   CBC Recent Labs  Lab 04/25/18 0148  WBC 13.1*  HGB 13.5  HCT 41.3  PLT 304   ------------------------------------------------------------------------------------------------------------------ Chemistries  Recent Labs  Lab 04/27/18 0415  NA 140  K 3.8  CL 98  CO2 33*  GLUCOSE 108*  BUN 22  CREATININE 0.70  CALCIUM 8.9   ------------------------------------------------------------------------------------------------------------------  Cardiac Enzymes Recent Labs  Lab 04/25/18 0148  TROPONINI <0.03   ------------------------------------------------------------------------------------------------------------------  RADIOLOGY:  No results found.   ASSESSMENT AND PLAN:   *Acute COPD exacerbation -IV steroids - Scheduled Nebulizers - Inhalers -Wean O2 as tolerated Patient on room air today saturations 93%.  But on ambulation dropped to 87%. Will need inpatient care for 1 more day.  Likely discharge tomorrow  *Acute on chronic diastolic congestive heart failure Improving.  Continue IV Lasix today. Counseled to limit fluids and salt.  *Tobacco abuse.  Counseled earlier during hospital stay  *DVT prophylaxis with Lovenox  All the records are reviewed and case discussed with Care Management/Social Worker Management plans discussed with the patient, family and they are in agreement.  CODE STATUS: FULL CODE  DVT Prophylaxis: SCDs  TOTAL TIME TAKING CARE OF THIS PATIENT: 35 minutes.   POSSIBLE D/C IN 1-2 DAYS, DEPENDING ON CLINICAL CONDITION.  Leia Alf Garcia Dalzell M.D on 04/27/2018 at 12:41 PM  Between 7am to 6pm - Pager - [phone]  After 6pm go to  [email or web address] - password [email or web address] [facility]  Office  [phone]  CC: Primary care physician; Tandy Gaw, PA  Note: This dictation was prepared with Dragon dictation along with smaller phrase technology. Any transcriptional errors that result from this process are unintentional.

## 2018-04-27 NOTE — Progress Notes (Signed)
Initial Nutrition Assessment  DOCUMENTATION CODES:   Severe malnutrition in context of chronic illness  INTERVENTION:   Ensure Enlive po TID, each supplement provides 350 kcal and 20 grams of protein  Magic cup TID with meals, each supplement provides 290 kcal and 9 grams of protein  MVI daily   Liberalize diet   Pt likely at moderate refeed risk; recommend monitor K, Mg and P labs daily   NUTRITION DIAGNOSIS:   Severe Malnutrition related to chronic illness(COPD, CHF, lung cancer) as evidenced by severe fat depletion, severe muscle depletion, 11 percent weight loss in 2 months.  GOAL:   Patient will meet greater than or equal to 90% of their needs  MONITOR:   PO intake, Supplement acceptance, Labs, Weight trends, Skin, I & O's  REASON FOR ASSESSMENT:   Consult Diet education  ASSESSMENT:   63 y/o male with past medical history of COPD, CHF, hypertension, CAD status post MI and stroke as well as lung cancer presents to the emergency department complaining of shortness of breath.    Met with pt in room today. Pt reports poor appetite and oral intake for several months pta. Pt reports eating 100% of his breakfast today. Pt eating lunch at time of RD visit and had eaten <25% of his meal. Pt still SOB today; reports difficulty eating because of this. Pt also reports GERD today; reports he usually takes daily medicine at home for this. Spoke with MD, will start protonix. Pt does enjoy chocolate nutritional supplements but reports he is unable to afford these at home. RD provided pt with Ensure coupons today. Per chart, pt with 17lb(11%) wt loss over the past 2 months; this is significant. RD discussed with pt the importance of adequate nutrition needed to preserve lean muscle. RD also provided pt with low sodium diet education today; pt reports that he does not like salt. Recommend continue supplements and MVI after discharge as pt is severely malnourished.    Medications reviewed  and include: colace, lovenox, nicotine, protonix, prednisone   Labs reviewed:   NUTRITION - FOCUSED PHYSICAL EXAM:    Most Recent Value  Orbital Region  Mild depletion  Upper Arm Region  Severe depletion  Thoracic and Lumbar Region  Severe depletion  Buccal Region  Severe depletion  Temple Region  Moderate depletion  Clavicle Bone Region  Severe depletion  Clavicle and Acromion Bone Region  Severe depletion  Scapular Bone Region  Moderate depletion  Dorsal Hand  Severe depletion  Patellar Region  Severe depletion  Anterior Thigh Region  Severe depletion  Posterior Calf Region  Severe depletion  Edema (RD Assessment)  None  Hair  Reviewed  Eyes  Reviewed  Mouth  Reviewed  Skin  Reviewed  Nails  Reviewed     Diet Order:   Diet Order            Diet 2 gram sodium Room service appropriate? Yes; Fluid consistency: Thin  Diet effective now             EDUCATION NEEDS:   Education needs have been addressed  Skin:  Skin Assessment: Reviewed RN Assessment  Last BM:  pta  Height:   Ht Readings from Last 1 Encounters:  04/25/18 '5\' 8"'  (1.727 m)    Weight:   Wt Readings from Last 1 Encounters:  04/27/18 63.1 kg    Ideal Body Weight:  70 kg  BMI:  Body mass index is 21.17 kg/m.  Estimated Nutritional Needs:   Kcal:  1700-2000kcal/day   Protein:  95-107g/day   Fluid:  >1.6L/day   Koleen Distance MS, RD, LDN Pager #[phone] Office#- [phone] After Hours Pager: [phone]

## 2018-04-27 NOTE — Clinical Social Work Note (Addendum)
CSW spoke with patient and he was interested in considering a family care home setting.  CSW was given permission by patient to send information to DSS.  CSW contacted DSS to see if they can assist with helping patient find a family care home setting.  DSS asked CSW to fax H and P, facesheet, and FL2 to [phone].  CSW completed requested information and faxed to [facility] at [place].  CSW also talked with case manager who will set up home health social worker to assist patient.  Patient stated he can be contacted at [phone].  CSW also provided a list of family care homes, and [facility] resources for patient.  Patient was very appreciative of information given to him.  Jones Broom. [place], MSW, [place]  04/27/2018 3:04 PM

## 2018-04-27 NOTE — Progress Notes (Signed)
SATURATION QUALIFICATIONS: (This note is used to comply with regulatory documentation for home oxygen)  Patient Saturations on Room Air at Rest = 91%  Patient Saturations on Room Air while Ambulating = 87%  Patient saturations on 2L 94%  Please briefly explain why patient needs home oxygen: SOB and decreased O2 saturations with avctivity

## 2018-04-27 NOTE — Care Management Note (Signed)
Case Management Note  Patient Details  Name: Ruben [name] MRN: [id number] Date of Birth: Sep 01, 1955  Subjective/Objective:   Patient is from home with cousin.  He was admitted with COPD exacerbation.  Currently on 2L acute O2.  Receiving scheduled  Nebulizer's, IV lasix and oral prednisone.   Hx of CHF; does not have a functioning scale.  Explained importance of daily weights with his CHF history.  Brought a Living with Heart failure booklet to room and briefly reviewed.  Patient states he can read.  RNCM provided patient with a scale for discharge.  Patient has also qualified for oxygen at home.  Referral made to [facility] for DME.  Referral made for home health RN, Aide, PT and SW with [facility] after home health list provided.  Patient did not have a preference.    When asked about living situation he became tearful.  Patient states that he lives with his cousin.  His cousin is "crazy on opiates".  He says he does not feel unsafe but needs to "get out of there".  RNCM asked if he would be interested in a group home.  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  Also provided resources for [facility]. And a list of family care homes.  He states there are a couple of group homes near where he lives now.  Randall Hiss, CSW also to bedside to discuss options.  Patient is very appreciative of help from CM and CSW.  Will continue to follow and assist with discharge planning.                 Action/Plan:   Expected Discharge Date:  04/27/18               Expected Discharge Plan:  [place]  In-House Referral:     Discharge planning Services  CM Consult, HF Clinic  Post Acute Care Choice:  Home Health Choice offered to:  Patient  DME Arranged:    DME Agency:     HH Arranged:  RN, PT, Nurse's Aide, Social Work [employer] Agency:  [place]  Status of Service:  Completed, signed off  If discussed at [employer], dates discussed:    Additional  Comments:  Elza Rafter, RN 04/27/2018, 3:20 PM

## 2018-04-27 NOTE — NC FL2 (Signed)
[place] LEVEL OF CARE SCREENING TOOL     IDENTIFICATION  Patient Name: Ruben [name] Birthdate: [birth year] Sex: male Admission Date (Current Location): 04/25/2018  [place] and [place] Number:  Selena Lesser [id number] [place] and Address:  [facility], [address], [place], [place] [address]      Provider Number: [id number]  Attending Physician Name and Address:  Hillary Bow, MD  Relative Name and Phone Number:  Tedra Senegal [phone]     Current Level of Care: Hospital Recommended Level of Care: [place] Prior Approval Number:    Date Approved/Denied:   PASRR Number:    Discharge Plan: Domiciliary (Rest home)    Current Diagnoses: Patient Active Problem List   Diagnosis Date Noted  . COPD (chronic obstructive pulmonary disease) ([place]) 05/29/2017  . Pneumonia 06/28/2015  . COPD with exacerbation ([place]) 09/25/2014  . Depression 09/25/2014  . COPD exacerbation ([place]) 09/25/2014  . Severe recurrent major depression without psychotic features ([place]) 09/25/2014    Orientation RESPIRATION BLADDER Height & Weight     Time, Situation, Place, Self  Normal Continent Weight: 139 lb 3.2 oz (63.1 kg) Height:  5\' 8"  (172.7 cm)  BEHAVIORAL SYMPTOMS/MOOD NEUROLOGICAL BOWEL NUTRITION STATUS      Continent Diet(2G sodium diet)  AMBULATORY STATUS COMMUNICATION OF NEEDS Skin   Supervision Verbally Normal                       Personal Care Assistance Level of Assistance  Bathing, Feeding, Dressing Bathing Assistance: Independent Feeding assistance: Independent Dressing Assistance: Independent     Functional Limitations Info  Sight, Hearing, Speech Sight Info: Adequate Hearing Info: Adequate Speech Info: Adequate    SPECIAL CARE FACTORS FREQUENCY  PT (By licensed PT)     PT Frequency: Home Health minimum 2x a week              Contractures Contractures Info: Not present    Additional  Factors Info  Code Status, Allergies, Psychotropic Code Status Info: Full Code Allergies Info: NKA Psychotropic Info: sertraline (ZOLOFT) tablet 50 mg          Current Medications (04/27/2018):  This is the current hospital active medication list Current Facility-Administered Medications  Medication Dose Route Frequency Provider Last Rate Last Dose  . acetaminophen (TYLENOL) tablet 650 mg  650 mg Oral Q6H PRN Harrie Foreman, MD       Or  . acetaminophen (TYLENOL) suppository 650 mg  650 mg Rectal Q6H PRN Harrie Foreman, MD      . albuterol (PROVENTIL) (2.5 MG/3ML) 0.083% nebulizer solution 2.5 mg  2.5 mg Nebulization Q4H PRN Harrie Foreman, MD   2.5 mg at 04/25/18 8182  . atorvastatin (LIPITOR) tablet 10 mg  10 mg Oral Daily Hillary Bow, MD   10 mg at 04/27/18 0901  . chlorpheniramine-HYDROcodone (TUSSIONEX) 10-8 MG/5ML suspension 5 mL  5 mL Oral Q12H PRN Hillary Bow, MD   5 mL at 04/26/18 1138  . clonazePAM (KLONOPIN) tablet 0.5 mg  0.5 mg Oral BID Hillary Bow, MD   0.5 mg at 04/27/18 0901  . digoxin (LANOXIN) tablet 0.25 mg  0.25 mg Oral Daily Hillary Bow, MD   0.25 mg at 04/27/18 0904  . diltiazem (CARDIZEM CD) 24 hr capsule 300 mg  300 mg Oral Daily Hillary Bow, MD   300 mg at 04/27/18 0901  . docusate sodium (COLACE) capsule 100 mg  100 mg Oral BID Marcille Blanco,  Norva Riffle, MD   100 mg at 04/27/18 0901  . enoxaparin (LOVENOX) injection 40 mg  40 mg Subcutaneous Q24H Harrie Foreman, MD   40 mg at 04/27/18 1155  . feeding supplement (ENSURE ENLIVE) (ENSURE ENLIVE) liquid 237 mL  237 mL Oral TID BM Sudini, Srikar, MD      . ipratropium-albuterol (DUONEB) 0.5-2.5 (3) MG/3ML nebulizer solution 3 mL  3 mL Nebulization Q6H Sudini, Srikar, MD   3 mL at 04/27/18 1422  . metoprolol tartrate (LOPRESSOR) tablet 25 mg  25 mg Oral BID Hillary Bow, MD   25 mg at 04/27/18 0902  . multivitamin with minerals tablet 1 tablet  1 tablet Oral Daily Sudini, Srikar, MD      .  nicotine (NICODERM CQ - dosed in mg/24 hours) patch 14 mg  14 mg Transdermal Daily Hillary Bow, MD   14 mg at 04/27/18 0901  . ondansetron (ZOFRAN) tablet 4 mg  4 mg Oral Q6H PRN Harrie Foreman, MD       Or  . ondansetron [facility]) injection 4 mg  4 mg Intravenous Q6H PRN Harrie Foreman, MD      . pantoprazole (PROTONIX) EC tablet 40 mg  40 mg Oral Daily Sudini, Srikar, MD      . predniSONE (DELTASONE) tablet 50 mg  50 mg Oral Q breakfast Harrie Foreman, MD   50 mg at 04/27/18 2080  . sertraline (ZOLOFT) tablet 50 mg  50 mg Oral Daily Hillary Bow, MD   50 mg at 04/27/18 0901  . sodium chloride flush (NS) 0.9 % injection 3 mL  3 mL Intravenous Q12H Hillary Bow, MD   3 mL at 04/27/18 0904  . tiotropium (SPIRIVA) inhalation capsule ([facility] use ONLY) 18 mcg  18 mcg Inhalation Daily Harrie Foreman, MD         Discharge Medications: Please see discharge summary for a list of discharge medications.  Relevant Imaging Results:  Relevant Lab Results:   Additional Information SSN [id number]  Ross Ludwig, [place]

## 2018-04-28 MED ORDER — FUROSEMIDE 20 MG PO TABS: 20.0000 mg | ORAL_TABLET | Freq: Every day | ORAL | 0 refills | Status: DC

## 2018-04-28 MED ORDER — PREDNISONE 10 MG (21) PO TBPK: ORAL_TABLET | ORAL | 0 refills | Status: DC

## 2018-04-28 NOTE — Progress Notes (Signed)
Advance care planning  Purpose of Encounter COPD  Parties in Attendance Patient  Patients Decisional capacity Alert and oriented.  Able to make medical decisions  Patient has no documented healthcare power of attorney or advanced directives in place  Discussed regarding COPD, CHF.  Treatment plan and prognosis discussed.  CODE STATUS discussed.  Patient would like aggressive care with full scope of treatment  FULL CODE  Time spent - 17 minutes

## 2018-04-28 NOTE — Progress Notes (Signed)
Discharged to home with a friend.  Prescription sent to [facility] clinic.  Patient knows he has to pick it up there.  Follow up appointments made.  No longer requires home oxygen.

## 2018-04-28 NOTE — Care Management (Signed)
Patient was 91% sitting; 92% ambulating and stayed steady at 92%.

## 2018-04-28 NOTE — Discharge Summary (Signed)
[facility] NAME: Ruben [name]    MR#:  [id number]  DATE OF BIRTH:  [birth year]  DATE OF ADMISSION:  04/25/2018 ADMITTING PHYSICIAN: Harrie Foreman, MD  DATE OF DISCHARGE: 04/28/2018  PRIMARY CARE PHYSICIAN: Tandy Gaw, PA   ADMISSION DIAGNOSIS:  COPD exacerbation ([place]) [J44.1] Community acquired pneumonia, unspecified laterality [J18.9]  DISCHARGE DIAGNOSIS:  Active Problems:   COPD exacerbation ([place])   SECONDARY DIAGNOSIS:   Past Medical History:  Diagnosis Date  . Cancer ([place])    lung cancer  . CHF (congestive heart failure) ([place])   . COPD (chronic obstructive pulmonary disease) ([place])   . Coronary artery disease   . Depression   . Hypertension   . Myocardial infarction ([place])   . Stroke [facility])      ADMITTING HISTORY  Chief Complaint: Shortness of breath HPI: The patient with past medical history of COPD, CHF, hypertension, CAD status post MI and stroke as well as lung cancer presents to the emergency department complaining of shortness of breath.  The patient states that he has had cough, fever and shortness of breath x4 days.  He has been using his albuterol inhaler nearly hourly today with little relief.  Upon arrival to the emergency department the patient was afebrile but mildly tachypneic and wheezing throughout.  He received Solu-Medrol as well as multiple DuoNeb treatments.  Chest x-ray showed only bronchitic changes and hyperinflation however the patient clinically appeared to have pneumonia which prompted the emergency department staff to call the hospitalist service for admission.  HOSPITAL COURSE:   *Acute COPD exacerbation *Acute on chronic diastolic congestive heart failure *Tobacco abuse.    Patient admitted to medical floor with telemetry monitoring.  Started on IV steroids .  Nebulizers.  Has been on 2 L oxygen.  Try to wean off.  By day of discharge he continues to need 2 L oxygen.  Saturations  87% on room air.  For his acute on chronic diastolic congestive heart failure old echocardiogram was reviewed.  IV Lasix given.  Diuresed well.  Potassium replaced as needed.  BMP monitored.  Patient CHF is improved at this time.  Will be going home on beta-blockers and Lasix.  Follow-up at CHF clinic.  Counseled to quit tobacco, cocaine.  Patient stable for discharge home with oxygen.  CONSULTS OBTAINED:    DRUG ALLERGIES:  No Known Allergies  DISCHARGE MEDICATIONS:   Allergies as of 04/28/2018   No Known Allergies     Medication List    TAKE these medications   albuterol 108 (90 Base) MCG/ACT inhaler Commonly known as:  PROVENTIL HFA;VENTOLIN HFA Inhale 2 puffs into the lungs every 6 (six) hours as needed for wheezing or shortness of breath.   atorvastatin 10 MG tablet Commonly known as:  LIPITOR Take 1 tablet (10 mg total) by mouth daily.   clonazePAM 0.5 MG tablet Commonly known as:  KLONOPIN Take 1 tablet (0.5 mg total) by mouth 2 (two) times daily.   digoxin 0.25 MG tablet Commonly known as:  LANOXIN Take 1 tablet (0.25 mg total) by mouth daily.   diltiazem 300 MG 24 hr capsule Commonly known as:  CARDIZEM CD Take 1 capsule (300 mg total) by mouth daily.   escitalopram 20 MG tablet Commonly known as:  LEXAPRO Take 1 tablet (20 mg total) by mouth every morning.   furosemide 20 MG tablet Commonly known as:  LASIX Take 1 tablet (20 mg total) by mouth daily.  ipratropium-albuterol 0.5-2.5 (3) MG/3ML Soln Commonly known as:  DUONEB Take 3 mLs by nebulization every 4 (four) hours as needed (shortness of breath).   metoprolol tartrate 25 MG tablet Commonly known as:  LOPRESSOR Take 1 tablet (25 mg total) by mouth 2 (two) times daily.   mometasone-formoterol 200-5 MCG/ACT Aero Commonly known as:  DULERA Inhale 2 puffs into the lungs 2 (two) times daily.   predniSONE 10 MG (21) Tbpk tablet Commonly known as:  STERAPRED UNI-PAK 21 TAB Taper by 10 mg  daily   sertraline 50 MG tablet Commonly known as:  ZOLOFT Take 50 mg by mouth daily.            Durable Medical Equipment  (From admission, onward)         Start     Ordered   04/28/18 1230  For home use only DME oxygen  Once    Comments:  COPD  Question Answer Comment  Mode or (Route) Nasal cannula   Liters per Minute 2   Frequency Continuous (stationary and portable oxygen unit needed)   Oxygen delivery system Gas      04/28/18 1230          Today   VITAL SIGNS:  Blood pressure 135/85, pulse 82, temperature 97.7 F (36.5 C), temperature source Oral, resp. rate 20, height 5\' 8"  (1.727 m), weight 63.8 kg, SpO2 93 %.  I/O:    Intake/Output Summary (Last 24 hours) at 04/28/2018 1523 Last data filed at 04/28/2018 1230 Gross per 24 hour  Intake 720 ml  Output 700 ml  Net 20 ml    PHYSICAL EXAMINATION:  Physical Exam  GENERAL:  63 y.o.-year-old patient lying in the bed with no acute distress.  LUNGS: Normal breath sounds bilaterally, no wheezing, rales,rhonchi or crepitation. No use of accessory muscles of respiration.  CARDIOVASCULAR: S1, S2 normal. No murmurs, rubs, or gallops.  ABDOMEN: Soft, non-tender, non-distended. Bowel sounds present. No organomegaly or mass.  NEUROLOGIC: Moves all 4 extremities. PSYCHIATRIC: The patient is alert and oriented x 3.  SKIN: No obvious rash, lesion, or ulcer.   DATA REVIEW:   CBC Recent Labs  Lab 04/25/18 0148  WBC 13.1*  HGB 13.5  HCT 41.3  PLT 304    Chemistries  Recent Labs  Lab 04/27/18 0415  NA 140  K 3.8  CL 98  CO2 33*  GLUCOSE 108*  BUN 22  CREATININE 0.70  CALCIUM 8.9    Cardiac Enzymes Recent Labs  Lab 04/25/18 0148  TROPONINI <0.03    Microbiology Results  Results for orders placed or performed during the hospital encounter of 04/26/17  MRSA PCR Screening     Status: None   Collection Time: 04/26/17  1:40 PM  Result Value Ref Range Status   MRSA by PCR NEGATIVE NEGATIVE Final     Comment:        The GeneXpert MRSA Assay (FDA approved for NASAL specimens only), is one component of a comprehensive MRSA colonization surveillance program. It is not intended to diagnose MRSA infection nor to guide or monitor treatment for MRSA infections. Performed at [facility], [address], [place], [place] [address]     RADIOLOGY:  No results found.  Follow up with PCP in 1 week.  Management plans discussed with the patient, family and they are in agreement.  CODE STATUS:     Code Status Orders  (From admission, onward)         Start  Ordered   04/25/18 0920  Full code  Continuous     04/25/18 0919        Code Status History    Date Active Date Inactive Code Status Order ID Comments User Context   02/16/2018 1010 02/19/2018 1614 Full Code [id number]  Max Sane, MD Inpatient   05/30/2017 0903 05/31/2017 1342 DNR [id number]  Max Sane, MD Inpatient   05/29/2017 2219 05/30/2017 0903 Full Code [id number]  Salary, Avel Peace, MD Inpatient   04/26/2017 0758 04/29/2017 1634 Full Code [id number]  Harrie Foreman, MD Inpatient   08/25/2016 0626 09/01/2016 1540 Full Code [id number]  Saundra Shelling, MD Inpatient   06/23/2016 1856 06/26/2016 1514 Full Code [id number]  Gladstone Lighter, MD Inpatient   06/28/2015 1309 07/01/2015 1936 Full Code [id number]  Bettey Costa, MD Inpatient   06/28/2015 1109 06/28/2015 1115 DNR [id number]  Bettey Costa, MD ED   09/25/2014 0434 09/26/2014 1441 DNR [id number]  Lance Coon, MD Inpatient    Advance Directive Documentation     Most Recent Value  Type of Advance Directive  Living will  Pre-existing out of facility DNR order (yellow form or pink MOST form)  -  "MOST" Form in Place?  -      TOTAL TIME TAKING CARE OF THIS PATIENT ON DAY OF DISCHARGE: more than 30 minutes.   Leia Alf Albeiro Trompeter M.D on 04/28/2018 at 3:23 PM  Between 7am to 6pm - Pager - [phone]  After 6pm go to [email or web address] - password [email or web address] [place]  [facility]  Office  [phone]  CC: Primary care physician; Tandy Gaw, PA  Note: This dictation was prepared with Dragon dictation along with smaller phrase technology. Any transcriptional errors that result from this process are unintentional.

## 2018-04-28 NOTE — Progress Notes (Signed)
Cardiovascular and Pulmonary Nurse Navigator Note:    63 year old admitted with PMHx of COPD,  CHF, HTN, CAD - s/p MI and stroke, lung cancer and depression who presented to the ER with c/o SOB.   Patient admitted with acute COPD exacerbation and acute on chronic diastolic CHF.  BNP 139 on admission.     CHF Education:?? Educational session with patient completed.  Provided patient with "Living Better with Heart Failure" packet. Briefly reviewed definition of heart failure and signs and symptoms of an exacerbation.?Explained to patient that HF is a chronic illness which requires self-assessment / self-management along with help from the cardiologist/PCP.?? ? *Reviewed importance of and reason behind checking weight daily in the AM, after using the bathroom, but before getting dressed. Patient did not have functioning scales.  RNCM provided patient with scales.   ? *Reviewed with patient the following information: *Discussed when to call the Dr= weight gain of >2-3lb overnight of 5lb in a week,  *Discussed yellow zone= call MD: weight gain of >2-3lb overnight of 5lb in a week, increased swelling, increased SOB when lying down, chest discomfort, dizziness, increased fatigue *Red Zone= call 911: struggle to breath, fainting or near fainting, significant chest pain   *Diet - Reviewed low sodium diet-provided handout of recommended and not recommended foods.?Dietitian Consultation for education and assessment for nutritional needs (patient's BMI is 21.39) was completed yesterday.   ? *Discussed fluid intake with patient as well. Patient not currently on a fluid restriction, but advised no more than 64 ounces of fluids per day.? ? *Instructed patient to take medications as prescribed for heart failure. Explained briefly why pt is on the medications (either make you feel better, live longer or keep you out of the hospital) and discussed monitoring and side effects.  ? *Discussed exercise. Encouraged  patient to remain as active as possible.  Home health referral has been placed by [facility] for in-home PT.   ? *Smoking Cessation- Patient is a CURRENT every day smoker.?Patient reported that he has been smoking cigarettes since the age of 72.  He has reduced down to 1 pack per day, as he reported he used to smoke 2 packs per day.  Patient reports that the providers at [facility] are helping   ? *[facility] - Explained the purpose of the HF Clinic. ?Explained to patient the HF Clinic does not replace PCP nor Cardiologist, but is an additional resource to helping patient manage heart failure at home. Informed patient of appointment in the [facility] for May 05, 2018  At 11:00 a.m.    Patient for possible discharge today.   RNCM also made referral for home health RN, Aide, PT and SW.  RNCM and SW assisting patient with finding a family home care setting.  Please see RNCM and SW notes.   ? Patient thanked me for providing the above information. ? ? Roanna Epley, RN, BSN, [facility]? [facility]  Cardiovascular &?Pulmonary Nurse Navigator  Direct Line: [phone]  Department Phone #: [phone] Fax: [phone]? Email Address: [email or web address]

## 2018-04-28 NOTE — Discharge Instructions (Signed)
°-   Daily fluids < 2 liters. - Low salt diet - Check weight everyday and keep log. Take to your doctors appt. - Take extra dose of lasix if you gain more than 3 pounds weight.  QUIT SMOKING, MARIJUANA, COCAINE

## 2018-05-01 ENCOUNTER — Telehealth: Payer: Self-pay

## 2018-05-01 NOTE — Telephone Encounter (Signed)
Flagged on EMMI report for having unfilled prescriptions and not being able to fill them today/tomorrow.  First attempt to reach patient made, however informed patient unavailable.  Will attempt at later time.

## 2018-05-03 NOTE — Telephone Encounter (Signed)
Second attempt made, however line was busy and unable to get through.  No further attempts at this time.

## 2018-05-05 ENCOUNTER — Ambulatory Visit: Payer: Medicaid Other | Admitting: Family

## 2018-05-15 ENCOUNTER — Ambulatory Visit: Payer: Medicaid Other | Admitting: Family

## 2019-01-29 ENCOUNTER — Emergency Department: Payer: Medicaid Other

## 2019-01-29 ENCOUNTER — Other Ambulatory Visit: Payer: Self-pay

## 2019-01-29 ENCOUNTER — Inpatient Hospital Stay
Admission: EM | Admit: 2019-01-29 | Discharge: 2019-02-03 | DRG: 190 | Disposition: A | Discharge status: Home Health | Payer: Medicaid Other | Attending: Internal Medicine | Admitting: Internal Medicine

## 2019-01-29 DIAGNOSIS — E785 Hyperlipidemia, unspecified: Secondary | ICD-10-CM | POA: Diagnosis present

## 2019-01-29 DIAGNOSIS — K2100 Gastro-esophageal reflux disease with esophagitis, without bleeding: Secondary | ICD-10-CM | POA: Diagnosis not present

## 2019-01-29 DIAGNOSIS — T18128A Food in esophagus causing other injury, initial encounter: Secondary | ICD-10-CM | POA: Diagnosis not present

## 2019-01-29 DIAGNOSIS — F419 Anxiety disorder, unspecified: Secondary | ICD-10-CM | POA: Diagnosis present

## 2019-01-29 DIAGNOSIS — I509 Heart failure, unspecified: Secondary | ICD-10-CM | POA: Diagnosis present

## 2019-01-29 DIAGNOSIS — X58XXXA Exposure to other specified factors, initial encounter: Secondary | ICD-10-CM | POA: Diagnosis not present

## 2019-01-29 DIAGNOSIS — Z23 Encounter for immunization: Secondary | ICD-10-CM

## 2019-01-29 DIAGNOSIS — Z8673 Personal history of transient ischemic attack (TIA), and cerebral infarction without residual deficits: Secondary | ICD-10-CM | POA: Diagnosis not present

## 2019-01-29 DIAGNOSIS — Y92230 Patient room in hospital as the place of occurrence of the external cause: Secondary | ICD-10-CM | POA: Diagnosis not present

## 2019-01-29 DIAGNOSIS — Z716 Tobacco abuse counseling: Secondary | ICD-10-CM

## 2019-01-29 DIAGNOSIS — F331 Major depressive disorder, recurrent, moderate: Secondary | ICD-10-CM | POA: Diagnosis present

## 2019-01-29 DIAGNOSIS — F17210 Nicotine dependence, cigarettes, uncomplicated: Secondary | ICD-10-CM | POA: Diagnosis present

## 2019-01-29 DIAGNOSIS — Z7951 Long term (current) use of inhaled steroids: Secondary | ICD-10-CM

## 2019-01-29 DIAGNOSIS — Z515 Encounter for palliative care: Secondary | ICD-10-CM | POA: Diagnosis not present

## 2019-01-29 DIAGNOSIS — J9601 Acute respiratory failure with hypoxia: Secondary | ICD-10-CM | POA: Diagnosis present

## 2019-01-29 DIAGNOSIS — Z20828 Contact with and (suspected) exposure to other viral communicable diseases: Secondary | ICD-10-CM | POA: Diagnosis present

## 2019-01-29 DIAGNOSIS — Z681 Body mass index (BMI) 19 or less, adult: Secondary | ICD-10-CM

## 2019-01-29 DIAGNOSIS — R1314 Dysphagia, pharyngoesophageal phase: Secondary | ICD-10-CM | POA: Diagnosis not present

## 2019-01-29 DIAGNOSIS — J441 Chronic obstructive pulmonary disease with (acute) exacerbation: Secondary | ICD-10-CM

## 2019-01-29 DIAGNOSIS — Z79899 Other long term (current) drug therapy: Secondary | ICD-10-CM

## 2019-01-29 DIAGNOSIS — Z85118 Personal history of other malignant neoplasm of bronchus and lung: Secondary | ICD-10-CM | POA: Diagnosis not present

## 2019-01-29 DIAGNOSIS — I252 Old myocardial infarction: Secondary | ICD-10-CM | POA: Diagnosis not present

## 2019-01-29 DIAGNOSIS — E43 Unspecified severe protein-calorie malnutrition: Secondary | ICD-10-CM | POA: Diagnosis present

## 2019-01-29 DIAGNOSIS — Z8249 Family history of ischemic heart disease and other diseases of the circulatory system: Secondary | ICD-10-CM

## 2019-01-29 DIAGNOSIS — I2510 Atherosclerotic heart disease of native coronary artery without angina pectoris: Secondary | ICD-10-CM | POA: Diagnosis present

## 2019-01-29 DIAGNOSIS — G4700 Insomnia, unspecified: Secondary | ICD-10-CM | POA: Diagnosis present

## 2019-01-29 DIAGNOSIS — Z7189 Other specified counseling: Secondary | ICD-10-CM | POA: Diagnosis not present

## 2019-01-29 DIAGNOSIS — R0602 Shortness of breath: Secondary | ICD-10-CM | POA: Diagnosis present

## 2019-01-29 DIAGNOSIS — I110 Hypertensive heart disease with heart failure: Secondary | ICD-10-CM | POA: Diagnosis present

## 2019-01-29 DIAGNOSIS — K222 Esophageal obstruction: Secondary | ICD-10-CM | POA: Diagnosis not present

## 2019-01-29 LAB — BLOOD GAS, VENOUS
Patient temperature: 37
pCO2, Ven: 56 mmHg (ref 44.0–60.0)
pH, Ven: 7.36 (ref 7.250–7.430)
pO2, Ven: 36 mmHg (ref 32.0–45.0)

## 2019-01-29 LAB — CBC WITH DIFFERENTIAL/PLATELET
Abs Immature Granulocytes: 0.01 10*3/uL (ref 0.00–0.07)
Basophils Absolute: 0 10*3/uL (ref 0.0–0.1)
Basophils Relative: 1 %
Eosinophils Absolute: 0.1 10*3/uL (ref 0.0–0.5)
Eosinophils Relative: 1 %
HCT: 42.3 % (ref 39.0–52.0)
Hemoglobin: 14.3 g/dL (ref 13.0–17.0)
Immature Granulocytes: 0 %
Lymphocytes Relative: 19 %
Lymphs Abs: 1 10*3/uL (ref 0.7–4.0)
MCH: 28.5 pg (ref 26.0–34.0)
MCHC: 33.8 g/dL (ref 30.0–36.0)
MCV: 84.4 fL (ref 80.0–100.0)
Monocytes Absolute: 0.7 10*3/uL (ref 0.1–1.0)
Monocytes Relative: 14 %
Neutro Abs: 3.3 10*3/uL (ref 1.7–7.7)
Neutrophils Relative %: 65 %
Platelets: 181 10*3/uL (ref 150–400)
RBC: 5.01 MIL/uL (ref 4.22–5.81)
RDW: 13.2 % (ref 11.5–15.5)
WBC: 5.1 10*3/uL (ref 4.0–10.5)
nRBC: 0 % (ref 0.0–0.2)

## 2019-01-29 LAB — COMPREHENSIVE METABOLIC PANEL
ALT: 11 U/L (ref 0–44)
AST: 15 U/L (ref 15–41)
Albumin: 3.9 g/dL (ref 3.5–5.0)
Alkaline Phosphatase: 69 U/L (ref 38–126)
Anion gap: 10 (ref 5–15)
BUN: 10 mg/dL (ref 8–23)
CO2: 27 mmol/L (ref 22–32)
Calcium: 8.8 mg/dL — ABNORMAL LOW (ref 8.9–10.3)
Chloride: 101 mmol/L (ref 98–111)
Creatinine, Ser: 0.65 mg/dL (ref 0.61–1.24)
GFR calc Af Amer: >60 mL/min (ref >60)
GFR calc non Af Amer: >60 mL/min (ref >60)
Glucose, Bld: 105 mg/dL — ABNORMAL HIGH (ref 70–99)
Potassium: 3.7 mmol/L (ref 3.5–5.1)
Sodium: 138 mmol/L (ref 135–145)
Total Bilirubin: 0.7 mg/dL (ref 0.3–1.2)
Total Protein: 7.2 g/dL (ref 6.5–8.1)

## 2019-01-29 LAB — SARS CORONAVIRUS 2 (TAT 6-24 HRS): SARS Coronavirus 2: NEGATIVE

## 2019-01-29 LAB — TROPONIN I (HIGH SENSITIVITY)
Troponin I (High Sensitivity): 5 ng/L (ref <18)
Troponin I (High Sensitivity): 5 ng/L (ref <18)

## 2019-01-29 MED ORDER — DIGOXIN 250 MCG PO TABS
0.2500 mg | ORAL_TABLET | Freq: Every day | ORAL | Status: DC
  Administered 2019-01-29 – 2019-02-03 (×5): 0.25 mg via ORAL
  Filled 2019-01-29 (×6): qty 1

## 2019-01-29 MED ORDER — ENOXAPARIN SODIUM 40 MG/0.4ML ~~LOC~~ SOLN
40.0000 mg | SUBCUTANEOUS | Status: DC
  Administered 2019-01-29 – 2019-02-02 (×5): 40 mg via SUBCUTANEOUS
  Filled 2019-01-29 (×5): qty 0.4

## 2019-01-29 MED ORDER — ACETAMINOPHEN 325 MG PO TABS: 650.0000 mg | ORAL_TABLET | Freq: Four times a day (QID) | ORAL | Status: DC | PRN

## 2019-01-29 MED ORDER — CLONAZEPAM 0.5 MG PO TABS
0.5000 mg | ORAL_TABLET | Freq: Two times a day (BID) | ORAL | Status: DC
  Administered 2019-01-29 – 2019-02-02 (×8): 0.5 mg via ORAL
  Filled 2019-01-29 (×8): qty 1

## 2019-01-29 MED ORDER — IPRATROPIUM-ALBUTEROL 0.5-2.5 (3) MG/3ML IN SOLN
3.0000 mL | Freq: Once | RESPIRATORY_TRACT | Status: AC
  Administered 2019-01-29: 3 mL via RESPIRATORY_TRACT
  Filled 2019-01-29: qty 3

## 2019-01-29 MED ORDER — METOPROLOL TARTRATE 25 MG PO TABS
25.0000 mg | ORAL_TABLET | Freq: Two times a day (BID) | ORAL | Status: DC
  Administered 2019-01-29 – 2019-02-03 (×9): 25 mg via ORAL
  Filled 2019-01-29 (×9): qty 1

## 2019-01-29 MED ORDER — FUROSEMIDE 20 MG PO TABS
20.0000 mg | ORAL_TABLET | Freq: Every day | ORAL | Status: DC
  Administered 2019-01-29 – 2019-02-03 (×5): 20 mg via ORAL
  Filled 2019-01-29 (×5): qty 1

## 2019-01-29 MED ORDER — METHYLPREDNISOLONE SODIUM SUCC 125 MG IJ SOLR
125.0000 mg | Freq: Once | INTRAMUSCULAR | Status: AC
  Administered 2019-01-29: 125 mg via INTRAVENOUS
  Filled 2019-01-29: qty 2

## 2019-01-29 MED ORDER — INFLUENZA VAC SPLIT QUAD 0.5 ML IM SUSY
0.5000 mL | PREFILLED_SYRINGE | INTRAMUSCULAR | Status: AC
  Administered 2019-02-01: 09:00:00 0.5 mL via INTRAMUSCULAR
  Filled 2019-01-29: qty 0.5

## 2019-01-29 MED ORDER — IPRATROPIUM-ALBUTEROL 0.5-2.5 (3) MG/3ML IN SOLN
3.0000 mL | RESPIRATORY_TRACT | Status: DC | PRN
  Administered 2019-01-29 – 2019-01-30 (×2): 3 mL via RESPIRATORY_TRACT
  Filled 2019-01-29 (×2): qty 3

## 2019-01-29 MED ORDER — SERTRALINE HCL 50 MG PO TABS
50.0000 mg | ORAL_TABLET | Freq: Every day | ORAL | Status: DC
  Administered 2019-01-29 – 2019-02-01 (×3): 50 mg via ORAL
  Filled 2019-01-29 (×3): qty 1

## 2019-01-29 MED ORDER — ACETAMINOPHEN 650 MG RE SUPP: 650.0000 mg | Freq: Four times a day (QID) | RECTAL | Status: DC | PRN

## 2019-01-29 MED ORDER — NICOTINE 21 MG/24HR TD PT24
21.0000 mg | MEDICATED_PATCH | Freq: Every day | TRANSDERMAL | Status: DC
  Administered 2019-01-29 – 2019-02-03 (×6): 21 mg via TRANSDERMAL
  Filled 2019-01-29 (×6): qty 1

## 2019-01-29 MED ORDER — SENNOSIDES-DOCUSATE SODIUM 8.6-50 MG PO TABS: 1.0000 | ORAL_TABLET | Freq: Every evening | ORAL | Status: DC | PRN

## 2019-01-29 MED ORDER — ATORVASTATIN CALCIUM 20 MG PO TABS
10.0000 mg | ORAL_TABLET | Freq: Every day | ORAL | Status: DC
  Administered 2019-01-29 – 2019-02-03 (×5): 10 mg via ORAL
  Filled 2019-01-29 (×5): qty 1

## 2019-01-29 MED ORDER — ONDANSETRON HCL 4 MG/2ML IJ SOLN: 4.0000 mg | Freq: Four times a day (QID) | INTRAMUSCULAR | Status: DC | PRN

## 2019-01-29 MED ORDER — METHYLPREDNISOLONE SODIUM SUCC 125 MG IJ SOLR
60.0000 mg | Freq: Four times a day (QID) | INTRAMUSCULAR | Status: DC
  Administered 2019-01-29 – 2019-01-31 (×7): 60 mg via INTRAVENOUS
  Filled 2019-01-29 (×7): qty 2

## 2019-01-29 MED ORDER — HYDROCODONE-ACETAMINOPHEN 5-325 MG PO TABS: 1.0000 | ORAL_TABLET | ORAL | Status: DC | PRN

## 2019-01-29 MED ORDER — DILTIAZEM HCL ER COATED BEADS 300 MG PO CP24
300.0000 mg | ORAL_CAPSULE | Freq: Every day | ORAL | Status: DC
  Administered 2019-01-29 – 2019-02-03 (×5): 300 mg via ORAL
  Filled 2019-01-29 (×6): qty 1

## 2019-01-29 MED ORDER — ONDANSETRON HCL 4 MG PO TABS: 4.0000 mg | ORAL_TABLET | Freq: Four times a day (QID) | ORAL | Status: DC | PRN

## 2019-01-29 NOTE — ED Triage Notes (Signed)
Pt arrived via ems from home with c/o [facility] since Friday - pt reports having to sit in a chair to breath and still SHOB - c/o inability to walk more than a few steps d/t SHOB - pt reports taking 8 albuterol treatments prior to ems arrival - c/o leg cramps and chest pain at this time - pt newly dx COPD

## 2019-01-29 NOTE — ED Provider Notes (Signed)
[facility] Emergency Department Provider Note       Time seen: ----------------------------------------- 12:27 PM on 01/29/2019 -----------------------------------------   I have reviewed the triage vital signs and the nursing notes.  HISTORY   Chief Complaint Shortness of Breath and Chest Pain    HPI Ruben [name] is a 63 y.o. male with a history of lung cancer, CHF, COPD, depression, hypertension, MI, CVA who presents to the ED for shortness of breath since Friday.  Patient reports having said the chair to breathe but still short of breath.  He has had numerous breathing treatments prior to arrival.  He complains of leg cramps and chest pain.  Discomfort is 6 out of 10.  Past Medical History:  Diagnosis Date  . Cancer ([place])    lung cancer  . CHF (congestive heart failure) ([place])   . COPD (chronic obstructive pulmonary disease) ([place])   . Coronary artery disease   . Depression   . Hypertension   . Myocardial infarction ([place])   . Stroke [facility])     Patient Active Problem List   Diagnosis Date Noted  . COPD (chronic obstructive pulmonary disease) ([place]) 05/29/2017  . Pneumonia 06/28/2015  . COPD with exacerbation ([place]) 09/25/2014  . Depression 09/25/2014  . COPD exacerbation ([place]) 09/25/2014  . Severe recurrent major depression without psychotic features ([place]) 09/25/2014    Past Surgical History:  Procedure Laterality Date  . CARDIAC CATHETERIZATION    . ELBOW SURGERY Left   . FLEXIBLE BRONCHOSCOPY Bilateral 08/27/2016   Procedure: FLEXIBLE BRONCHOSCOPY;  Surgeon: Allyne Gee, MD;  Location: [facility] ORS;  Service: Pulmonary;  Laterality: Bilateral;  . WRIST SURGERY Left     Allergies Patient has no known allergies.  Social History Social History   Tobacco Use  . Smoking status: Current Every Day Smoker    Packs/day: 1.00    Years: 43.00    Pack years: 43.00    Types: Cigarettes  . Smokeless tobacco: Never Used  Substance Use Topics   . Alcohol use: No  . Drug use: Yes    Frequency: 4.0 times per week    Types: Marijuana   Review of Systems Constitutional: Negative for fever. Cardiovascular: Positive for chest pain Respiratory: Shortness of breath Gastrointestinal: Negative for abdominal pain, vomiting and diarrhea. Musculoskeletal: Positive for leg cramps Skin: Negative for rash. Neurological: Negative for headaches, focal weakness or numbness.  All systems negative/normal/unremarkable except as stated in the HPI  ____________________________________________   PHYSICAL EXAM:  VITAL SIGNS: ED Triage Vitals  Enc Vitals Group     BP 01/29/19 1146 134/83     Pulse Rate 01/29/19 1146 86     Resp 01/29/19 1146 20     Temp 01/29/19 1146 97.6 F (36.4 C)     Temp Source 01/29/19 1146 Oral     SpO2 01/29/19 1146 97 %     Weight 01/29/19 1141 140 lb (63.5 kg)     Height 01/29/19 1141 6' (1.829 m)     Head Circumference --      Peak Flow --      Pain Score 01/29/19 1140 6     Pain Loc --      Pain Edu? --      Excl. in [place]? --    Constitutional: Alert and oriented.  Chronically ill-appearing, mild distress Eyes: Conjunctivae are normal. Normal extraocular movements. ENT      Head: Normocephalic and atraumatic.      Nose: No congestion/rhinnorhea.  Mouth/Throat: Mucous membranes are moist.      Neck: No stridor. Cardiovascular: Normal rate, regular rhythm. No murmurs, rubs, or gallops. Respiratory: Tachypnea with wheezing and rhonchi bilaterally Gastrointestinal: Soft and nontender. Normal bowel sounds Musculoskeletal: Nontender with normal range of motion in extremities. No lower extremity tenderness nor edema. Neurologic:  Normal speech and language. No gross focal neurologic deficits are appreciated.  Skin:  Skin is warm, dry and intact. No rash noted. Psychiatric: Mood and affect are normal. Speech and behavior are normal.  ____________________________________________  EKG: Interpreted by me.   Sinus rhythm the rate of 64 bpm, old anterior infarct, normal axis, normal QT  ____________________________________________  ED COURSE:  As part of my medical decision making, I reviewed the following data within the [place] History obtained from family if available, nursing notes, old chart and ekg, as well as notes from prior ED visits. Patient presented for shortness of breath and COPD exacerbation, we will assess with labs and imaging as indicated at this time.   Procedures  Ruben [name] was evaluated in Emergency Department on 01/29/2019 for the symptoms described in the history of present illness. He was evaluated in the context of the global COVID-19 pandemic, which necessitated consideration that the patient might be at risk for infection with the SARS-CoV-2 virus that causes COVID-19. Institutional protocols and algorithms that pertain to the evaluation of patients at risk for COVID-19 are in a state of rapid change based on information released by regulatory bodies including the CDC and federal and state organizations. These policies and algorithms were followed during the patient's care in the ED.  ____________________________________________   LABS (pertinent positives/negatives)  Labs Reviewed  COMPREHENSIVE METABOLIC PANEL - Abnormal; Notable for the following components:      Result Value   Glucose, Bld 105 (*)    Calcium 8.8 (*)    All other components within normal limits  SARS CORONAVIRUS 2 (TAT 6-24 HRS)  CBC WITH DIFFERENTIAL/PLATELET  BLOOD GAS, VENOUS  TROPONIN I (HIGH SENSITIVITY)  TROPONIN I (HIGH SENSITIVITY)    RADIOLOGY Images were viewed by me  Chest x-ray  IMPRESSION:  No acute abnormality. Stable changes of COPD.  ____________________________________________   DIFFERENTIAL DIAGNOSIS   CHF, COPD, pneumonia, COVID-19  FINAL ASSESSMENT AND PLAN  COPD exacerbation   Plan: The patient had presented for COPD exacerbation.  Patient's labs are unremarkable. Patient's imaging does not reveal any acute process.  Patient states he does not feel well enough to go home.  Will likely need admission, scheduled nebs and steroids with possible antibiotics for COPD exacerbation.   Laurence Aly, MD    Note: This note was generated in part or whole with voice recognition software. Voice recognition is usually quite accurate but there are transcription errors that can and very often do occur. I apologize for any typographical errors that were not detected and corrected.     Earleen Newport, MD 01/29/19 1344

## 2019-01-29 NOTE — Progress Notes (Signed)
Advanced care plan.  Purpose of the Encounter: CODE STATUS  Parties in Attendance:Patient  Patient's Decision Capacity: Good  Subjective/Patient's story: 63 y.o. male with a known history of COPD, chronic congestive heart failure, lung cancer, coronary disease, stroke, hypertension presented to the emergency room for shortness of breath and wheezing.  Is been going on for the last 2 days.  Patient was short of breath and wheezing and oxygen saturation was low and he was put on oxygen via nasal cannula.  No complaints of chest pain.  No fever chills and myalgias.  No recent travel.  COVID-19 test pending.  Objective/Medical story Patient needs nebulizer therapy IV Solu-Medrol Incentive spirometry Oral antibiotics  Goals of care determination:   CODE STATUS: Full code   Time spent discussing advanced care planning: 16 minutes

## 2019-01-29 NOTE — ED Notes (Signed)
Lunch tray and coke given to pt

## 2019-01-29 NOTE — H&P (Signed)
[facility] [place] NAME: Ruben [name]    MR#:  [id number]  DATE OF BIRTH:  06-13-1955  DATE OF ADMISSION:  01/29/2019  PRIMARY CARE PHYSICIAN: Ranae Plumber, PA   REQUESTING/REFERRING PHYSICIAN:   CHIEF COMPLAINT:   Chief Complaint  Patient presents with  . Shortness of Breath  . Chest Pain    HISTORY OF PRESENT ILLNESS: Ruben [name]  is a 62 y.o. male with a known history of COPD, chronic congestive heart failure, lung cancer, coronary disease, stroke, hypertension presented to the emergency room for shortness of breath and wheezing.  Is been going on for the last 2 days.  Patient was short of breath and wheezing and oxygen saturation was low and he was put on oxygen via nasal cannula.  No complaints of chest pain.  No fever chills and myalgias.  No recent travel.  COVID-19 test pending.  PAST MEDICAL HISTORY:   Past Medical History:  Diagnosis Date  . Cancer ([place])    lung cancer  . CHF (congestive heart failure) ([place])   . COPD (chronic obstructive pulmonary disease) ([place])   . Coronary artery disease   . Depression   . Hypertension   . Myocardial infarction ([place])   . Stroke [facility])     PAST SURGICAL HISTORY:  Past Surgical History:  Procedure Laterality Date  . CARDIAC CATHETERIZATION    . ELBOW SURGERY Left   . FLEXIBLE BRONCHOSCOPY Bilateral 08/27/2016   Procedure: FLEXIBLE BRONCHOSCOPY;  Surgeon: Allyne Gee, MD;  Location: [facility] ORS;  Service: Pulmonary;  Laterality: Bilateral;  . WRIST SURGERY Left     SOCIAL HISTORY:  Social History   Tobacco Use  . Smoking status: Current Every Day Smoker    Packs/day: 1.00    Years: 43.00    Pack years: 43.00    Types: Cigarettes  . Smokeless tobacco: Never Used  Substance Use Topics  . Alcohol use: No    FAMILY HISTORY:  Family History  Problem Relation Age of Onset  . Heart disease Other   . Hypertension Other   . Diabetes Other   . CAD Mother      DRUG ALLERGIES: No Known Allergies  REVIEW OF SYSTEMS:   CONSTITUTIONAL: No fever, fatigue or weakness.  EYES: No blurred or double vision.  EARS, NOSE, AND THROAT: No tinnitus or ear pain.  RESPIRATORY: No cough, has shortness of breath, wheezing  no hemoptysis.  CARDIOVASCULAR: No chest pain, orthopnea, edema.  GASTROINTESTINAL: No nausea, vomiting, diarrhea or abdominal pain.  GENITOURINARY: No dysuria, hematuria.  ENDOCRINE: No polyuria, nocturia,  HEMATOLOGY: No anemia, easy bruising or bleeding SKIN: No rash or lesion. MUSCULOSKELETAL: No joint pain or arthritis.   NEUROLOGIC: No tingling, numbness, weakness.  PSYCHIATRY: No anxiety or depression.   MEDICATIONS AT HOME:  Prior to Admission medications   Medication Sig Start Date End Date Taking? Authorizing Provider  albuterol (PROVENTIL HFA;VENTOLIN HFA) 108 (90 Base) MCG/ACT inhaler Inhale 2 puffs into the lungs every 6 (six) hours as needed for wheezing or shortness of breath. 02/19/18   Epifanio Lesches, MD  atorvastatin (LIPITOR) 10 MG tablet Take 1 tablet (10 mg total) by mouth daily. 02/20/18   Epifanio Lesches, MD  clonazePAM (KLONOPIN) 0.5 MG tablet Take 1 tablet (0.5 mg total) by mouth 2 (two) times daily. 02/19/18   Epifanio Lesches, MD  digoxin (LANOXIN) 0.25 MG tablet Take 1 tablet (0.25 mg total) by mouth daily. 02/19/18   Epifanio Lesches,  MD  diltiazem (CARDIZEM CD) 300 MG 24 hr capsule Take 1 capsule (300 mg total) by mouth daily. 02/20/18   Epifanio Lesches, MD  escitalopram (LEXAPRO) 20 MG tablet Take 1 tablet (20 mg total) by mouth every morning. Patient not taking: Reported on 04/25/2018 02/19/18   Epifanio Lesches, MD  furosemide (LASIX) 20 MG tablet Take 1 tablet (20 mg total) by mouth daily. 04/28/18 04/28/19  Hillary Bow, MD  ipratropium-albuterol (DUONEB) 0.5-2.5 (3) MG/3ML SOLN Take 3 mLs by nebulization every 4 (four) hours as needed (shortness of breath).    [provider]  metoprolol tartrate (LOPRESSOR) 25 MG tablet Take 1 tablet (25 mg total) by mouth 2 (two) times daily. 02/19/18   Epifanio Lesches, MD  mometasone-formoterol (DULERA) 200-5 MCG/ACT AERO Inhale 2 puffs into the lungs 2 (two) times daily. Patient not taking: Reported on 04/25/2018 02/19/18   Epifanio Lesches, MD  predniSONE (STERAPRED UNI-PAK 21 TAB) 10 MG (21) TBPK tablet Taper by 10 mg daily 04/28/18   Hillary Bow, MD  sertraline (ZOLOFT) 50 MG tablet Take 50 mg by mouth daily.    [provider]      PHYSICAL EXAMINATION:   VITAL SIGNS: Blood pressure (!) 165/73, pulse 64, temperature 97.6 F (36.4 C), temperature source Oral, resp. rate (!) 25, height 6' (1.829 m), weight 63.5 kg, SpO2 96 %.  GENERAL:  63 y.o.-year-old patient lying in the bed with no acute distress.  EYES: Pupils equal, round, reactive to light and accommodation. No scleral icterus. Extraocular muscles intact.  HEENT: Head atraumatic, normocephalic. Oropharynx and nasopharynx clear.  NECK:  Supple, no jugular venous distention. No thyroid enlargement, no tenderness.  LUNGS: Decreased breath sounds bilaterally, bilateral wheezing. No use of accessory muscles of respiration.  CARDIOVASCULAR: S1, S2 normal. No murmurs, rubs, or gallops.  ABDOMEN: Soft, nontender, nondistended. Bowel sounds present. No organomegaly or mass.  EXTREMITIES: No pedal edema, cyanosis, or clubbing.  NEUROLOGIC: Cranial nerves II through XII are intact. Muscle strength 5/5 in all extremities. Sensation intact. Gait not checked.  PSYCHIATRIC: The patient is alert and oriented x 3.  SKIN: No obvious rash, lesion, or ulcer.   LABORATORY PANEL:   CBC Recent Labs  Lab 01/29/19 1156  WBC 5.1  HGB 14.3  HCT 42.3  PLT 181  MCV 84.4  MCH 28.5  MCHC 33.8  RDW 13.2  LYMPHSABS 1.0  MONOABS 0.7  EOSABS 0.1  BASOSABS 0.0    ------------------------------------------------------------------------------------------------------------------  Chemistries  Recent Labs  Lab 01/29/19 1156  NA 138  K 3.7  CL 101  CO2 27  GLUCOSE 105*  BUN 10  CREATININE 0.65  CALCIUM 8.8*  AST 15  ALT 11  ALKPHOS 69  BILITOT 0.7   ------------------------------------------------------------------------------------------------------------------ estimated creatinine clearance is 84.9 mL/min (by C-G formula based on SCr of 0.65 mg/dL). ------------------------------------------------------------------------------------------------------------------ No results for input(s): TSH, T4TOTAL, T3FREE, THYROIDAB in the last 72 hours.  Invalid input(s): FREET3   Coagulation profile No results for input(s): INR, PROTIME in the last 168 hours. ------------------------------------------------------------------------------------------------------------------- No results for input(s): DDIMER in the last 72 hours. -------------------------------------------------------------------------------------------------------------------  Cardiac Enzymes No results for input(s): CKMB, TROPONINI, MYOGLOBIN in the last 168 hours.  Invalid input(s): CK ------------------------------------------------------------------------------------------------------------------ Invalid input(s): POCBNP  ---------------------------------------------------------------------------------------------------------------  Urinalysis    Component Value Date/Time   COLORURINE Yellow 06/01/2013 1013   APPEARANCEUR Clear 06/01/2013 1013   LABSPEC 1.017 06/01/2013 1013   PHURINE 5.0 06/01/2013 1013   GLUCOSEU Negative 06/01/2013 1013   HGBUR Negative 06/01/2013 1013  BILIRUBINUR Negative 06/01/2013 1013   KETONESUR Negative 06/01/2013 1013   PROTEINUR 100 mg/dL 06/01/2013 1013   NITRITE Negative 06/01/2013 1013   LEUKOCYTESUR Negative 06/01/2013 1013      RADIOLOGY: Dg Chest Portable 1 View  Result Date: 01/29/2019 CLINICAL DATA:  Shortness of breath for the past 3 days. Smoker. EXAM: PORTABLE CHEST 1 VIEW COMPARISON:  04/25/2018 FINDINGS: Normal sized heart. Stable mild right apical pleural and parenchymal scarring. The lungs remain hyperexpanded and clear. Old, healed left clavicle fracture. IMPRESSION: No acute abnormality. Stable changes of COPD. Electronically Signed   By: Claudie Revering M.D.   On: 01/29/2019 12:16    EKG: Orders placed or performed during the hospital encounter of 01/29/19  . ED EKG  . ED EKG  . EKG 12-Lead  . EKG 12-Lead    IMPRESSION AND PLAN: 63 year old male patient with a known history of COPD, chronic congestive heart failure, lung cancer, coronary disease, stroke, hypertension presented to the emergency room for shortness of breath and wheezing.  Its been going on for the last 2 days.   -Acute COPD exacerbation Oxygen via nasal cannula at 2 L Nebulization therapy IV Solu-Medrol 60 IV every 6 hourly  -Tobacco abuse Tobacco cessation counseled to the patient for 6 minutes Nicotine patch offered  -Hyperlipidemia Continue statin medication  -Hypertension Continue metoprolol and diltiazem  - DVT  Prophylaxis with [place] lovenox  All the records are reviewed and case discussed with ED provider. Management plans discussed with the patient, family and they are in agreement.  CODE STATUS:Full code Code Status History    Date Active Date Inactive Code Status Order ID Comments User Context   04/25/2018 0919 04/28/2018 2019 Full Code [id number]  Harrie Foreman, MD ED   02/16/2018 1010 02/19/2018 1614 Full Code [id number]  Max Sane, MD Inpatient   05/30/2017 0903 05/31/2017 1342 DNR [id number]  Max Sane, MD Inpatient   05/29/2017 2219 05/30/2017 0903 Full Code [id number]  Salary, Avel Peace, MD Inpatient   04/26/2017 0758 04/29/2017 1634 Full Code [id number]  Harrie Foreman, MD Inpatient   08/25/2016 0626  09/01/2016 1540 Full Code [id number]  Saundra Shelling, MD Inpatient   06/23/2016 1856 06/26/2016 1514 Full Code [id number]  Gladstone Lighter, MD Inpatient   06/28/2015 1309 07/01/2015 1936 Full Code [id number]  Bettey Costa, MD Inpatient   06/28/2015 1109 06/28/2015 1115 DNR [id number]  Bettey Costa, MD ED   09/25/2014 0434 09/26/2014 1441 DNR [id number]  Lance Coon, MD Inpatient   [facility] Care Planning Activity       TOTAL TIME TAKING CARE OF THIS PATIENT: 52 minutes.    Saundra Shelling M.D on 01/29/2019 at 2:24 PM  Between 7am to 6pm - Pager - [phone]  After 6pm go to [email or web address] - password [email or web address] [facility]  [facility]  Office  [phone]  CC: Primary care physician; Ranae Plumber, [place]

## 2019-01-29 NOTE — ED Notes (Signed)
Attempted to call report and was advised by charge nurse that the nurse was at lunch and to return call in 10 minutes

## 2019-01-29 NOTE — ED Notes (Signed)
Report given to Ashley RN

## 2019-01-30 LAB — BASIC METABOLIC PANEL
Anion gap: 5 (ref 5–15)
BUN: 19 mg/dL (ref 8–23)
CO2: 30 mmol/L (ref 22–32)
Calcium: 9 mg/dL (ref 8.9–10.3)
Chloride: 103 mmol/L (ref 98–111)
Creatinine, Ser: 0.7 mg/dL (ref 0.61–1.24)
GFR calc Af Amer: >60 mL/min (ref >60)
GFR calc non Af Amer: >60 mL/min (ref >60)
Glucose, Bld: 156 mg/dL — ABNORMAL HIGH (ref 70–99)
Potassium: 4.7 mmol/L (ref 3.5–5.1)
Sodium: 138 mmol/L (ref 135–145)

## 2019-01-30 MED ORDER — ENSURE ENLIVE PO LIQD
237.0000 mL | Freq: Three times a day (TID) | ORAL | Status: DC
  Administered 2019-01-30 – 2019-02-03 (×7): 237 mL via ORAL

## 2019-01-30 MED ORDER — MOMETASONE FURO-FORMOTEROL FUM 200-5 MCG/ACT IN AERO
2.0000 | INHALATION_SPRAY | Freq: Two times a day (BID) | RESPIRATORY_TRACT | Status: DC
  Administered 2019-01-30 – 2019-02-03 (×9): 2 via RESPIRATORY_TRACT
  Filled 2019-01-30: qty 8.8

## 2019-01-30 MED ORDER — ALBUTEROL SULFATE (2.5 MG/3ML) 0.083% IN NEBU
2.5000 mg | INHALATION_SOLUTION | RESPIRATORY_TRACT | Status: DC | PRN
  Administered 2019-01-30: 2.5 mg via RESPIRATORY_TRACT
  Filled 2019-01-30: qty 3

## 2019-01-30 MED ORDER — BUDESONIDE 0.25 MG/2ML IN SUSP
0.2500 mg | Freq: Two times a day (BID) | RESPIRATORY_TRACT | Status: DC
  Administered 2019-01-30 – 2019-02-03 (×9): 0.25 mg via RESPIRATORY_TRACT
  Filled 2019-01-30 (×9): qty 2

## 2019-01-30 MED ORDER — PANTOPRAZOLE SODIUM 40 MG PO TBEC
40.0000 mg | DELAYED_RELEASE_TABLET | Freq: Every day | ORAL | Status: DC
  Administered 2019-01-30: 21:00:00 40 mg via ORAL
  Filled 2019-01-30: qty 1

## 2019-01-30 MED ORDER — IPRATROPIUM-ALBUTEROL 0.5-2.5 (3) MG/3ML IN SOLN
3.0000 mL | Freq: Four times a day (QID) | RESPIRATORY_TRACT | Status: DC
  Administered 2019-01-30 – 2019-02-03 (×16): 3 mL via RESPIRATORY_TRACT
  Filled 2019-01-30 (×16): qty 3

## 2019-01-30 MED ORDER — AZITHROMYCIN 500 MG PO TABS
500.0000 mg | ORAL_TABLET | Freq: Every day | ORAL | Status: DC
  Administered 2019-01-30 – 2019-02-03 (×4): 500 mg via ORAL
  Filled 2019-01-30 (×4): qty 1

## 2019-01-30 NOTE — Evaluation (Signed)
Physical Therapy Evaluation Patient Details Name: Ruben [name] MRN: [id number] DOB: January 07, 1956 Today's Date: 01/30/2019   History of Present Illness  Ruben [name] is a 55yoM who comes [facility] with progressive SOB. Covid test negative. Pt admitted with COPD exacerbation.  Clinical Impression  Pt admitted with above diagnosis. Pt currently with functional limitations due to the deficits listed below (see "PT Problem List"). Upon entry, pt in bed, awake and agreeable to participate. The pt is alert and oriented x4, pleasant, conversational, and generally a good historian. Pt dictates his HPI as though he, at baseline, takes great steps to limit his activity within a very thin margin of error in relation to his SOB related restrictions. He reports no O2 use at home, and very slow AMB at the store for IADL. Pt reports heavy reliance on his inhaler to resolve his SOB complaints. Pt reports sustained anxiety, for which he medications are no longer helping as much as he would like. He reports his antidepressants are of no help whatsoever. Pt is modified independent with bed mobility and transfers, AMB very slowly and anxiously as not to make his SOB worse. Pt able to tolerate less than 25ft AMB on 2L, similar on 3L, O2 drop is incongruent with severity in worsening of RR and SOB, hence curious if PCO2 is more a function limitation at this time. Functional mobility assessment demonstrates increased effort/time requirements, poor tolerance, and need for physical assistance, whereas the patient performed these at a higher level of independence PTA. Concerningly to [place] pt appears very anxious and worried about his current and future state of health. Pt will benefit from skilled PT intervention to increase independence and safety with basic mobility in preparation for discharge to the venue listed below.       Follow Up Recommendations Home health PT    Equipment Recommendations  Cane    Recommendations  for Other Services       Precautions / Restrictions Precautions Precautions: Fall Restrictions Weight Bearing Restrictions: No      Mobility  Bed Mobility Overal bed mobility: Independent                Transfers Overall transfer level: Independent                  Ambulation/Gait Ambulation/Gait assistance: Supervision Gait Distance (Feet): 45 Feet(distance limited SOB after 40-3ft AMB; on 2L/min 89%, on 3L/min 91%.) Assistive device: None       General Gait Details: 0.28m/s  Stairs            Wheelchair Mobility    Modified Rankin (Stroke Patients Only)       Balance Overall balance assessment: History of Falls;Mild deficits observed, not formally tested(endorses utility of a SPC at DC)                                           Pertinent Vitals/Pain Pain Assessment: No/denies pain    Home Living Family/patient expects to be discharged to:: Private residence Living Arrangements: Alone(lived with cousin who is now in ALF) Available Help at Discharge: Family Type of Home: House Home Access: Stairs to enter Entrance Stairs-Rails: Psychiatric nurse of Steps: 2 Home Layout: One level Home Equipment: None Additional Comments: nebulizer; pt wishes he had a cane for baalnce now, but has not fallen in 3-4 months.    Prior Function Level  of Independence: Independent               Hand Dominance   Dominant Hand: Right    Extremity/Trunk Assessment   Upper Extremity Assessment Upper Extremity Assessment: Generalized weakness;Overall [facility] for tasks assessed    Lower Extremity Assessment Lower Extremity Assessment: Generalized weakness;Overall WFL for tasks assessed       Communication   Communication: No difficulties  Cognition Arousal/Alertness: Awake/alert Behavior During Therapy: Anxious Overall Cognitive Status: Within Functional Limits for tasks assessed                                  General Comments: May have some educational barriers      General Comments      Exercises     Assessment/Plan    PT Assessment Patient needs continued PT services  PT Problem List Cardiopulmonary status limiting activity;Decreased mobility;Decreased activity tolerance       PT Treatment Interventions DME instruction;Balance training;Gait training;Stair training;Functional mobility training;Therapeutic exercise;Patient/family education;Therapeutic activities    PT Goals (Current goals can be found in the Care Plan section)  Acute Rehab PT Goals Patient Stated Goal: reduce anxiety, improve function without SOB; refrain from falling PT Goal Formulation: With patient Time For Goal Achievement: 02/13/19 Potential to Achieve Goals: Fair    Frequency Min 2X/week   Barriers to discharge Decreased caregiver support      Co-evaluation               AM-PAC PT "6 Clicks" Mobility  Outcome Measure Help needed turning from your back to your side while in a flat bed without using bedrails?: None Help needed moving from lying on your back to sitting on the side of a flat bed without using bedrails?: None Help needed moving to and from a bed to a chair (including a wheelchair)?: None Help needed standing up from a chair using your arms (e.g., wheelchair or bedside chair)?: None Help needed to walk in hospital room?: A Little Help needed climbing 3-5 steps with a railing? : A Little 6 Click Score: 22    End of Session Equipment Utilized During Treatment: Oxygen Activity Tolerance: Patient limited by fatigue;Treatment limited secondary to medical complications (Comment);Other (comment) Patient left: in bed;with call bell/phone within reach;with chair alarm set   PT Visit Diagnosis: Dizziness and giddiness (R42);Difficulty in walking, not elsewhere classified (R26.2);Other abnormalities of gait and mobility (R26.89)    Time: 1610-9604 PT Time Calculation (min)  (ACUTE ONLY): 17 min   Charges:   PT Evaluation $PT Eval Moderate Complexity: 1 Mod         3:20 PM, 01/30/19 Etta Grandchild, PT, DPT Physical Therapist - [facility]  [phone] ([place])    [place] 01/30/2019, 3:10 PM

## 2019-01-30 NOTE — TOC Initial Note (Signed)
Transition of Care [facility]) - Initial/Assessment Note    Patient Details  Name: Ruben [name] MRN: [id number] Date of Birth: 1956/03/23  [facility] CM/SW Contact:    Candie Chroman, LCSW Phone Number: 01/30/2019, 4:16 PM  Clinical Narrative: CSW met with patient. No supports at bedside. CSW introduced role and explained that PT recommendations would be discussed. Patient unsure if he wants home health or not but is agreeable to CSW seeing if anyone could take him. [facility] is reviewing. They worked with him earlier this year. Patient is not on oxygen at home so will follow to see if he will need it at discharge. PT recommended a cane. Patient agreeable. No further concerns. CSW encouraged patient to contact CSW as needed. CSW will continue to follow patient for support and facilitate return home when stable.         Expected Discharge Plan: [place] Barriers to Discharge: Continued Medical Work up   Patient Goals and CMS Choice   CMS Medicare.gov Compare Post Acute Care list provided to:: Patient    Expected Discharge Plan and Services Expected Discharge Plan: [place] Choice: Home Health, Durable Medical Equipment(Maybe) Living arrangements for the past 2 months: Single Family Home                                      Prior Living Arrangements/Services Living arrangements for the past 2 months: Single Family Home Lives with:: Self(Cousin and her husband live behind him.) Patient language and need for interpreter reviewed:: Yes Do you feel safe going back to the place where you live?: Yes      Need for Family Participation in Patient Care: Yes (Comment) Care giver support system in place?: Yes (comment)   Criminal Activity/Legal Involvement Pertinent to Current Situation/Hospitalization: No - Comment as needed  Activities of Daily Living Home Assistive Devices/Equipment: Nebulizer ADL Screening  (condition at time of admission) Patient's cognitive ability adequate to safely complete daily activities?: Yes Is the patient deaf or have difficulty hearing?: No Does the patient have difficulty seeing, even when wearing glasses/contacts?: No Does the patient have difficulty concentrating, remembering, or making decisions?: No Patient able to express need for assistance with ADLs?: Yes Does the patient have difficulty dressing or bathing?: No Independently performs ADLs?: Yes (appropriate for developmental age) Does the patient have difficulty walking or climbing stairs?: No Weakness of Legs: None Weakness of Arms/Hands: None  Permission Sought/Granted Permission sought to share information with : Facility Art therapist granted to share information with : Yes, Verbal Permission Granted     Permission granted to share info w AGENCY: [place], [facility]        Emotional Assessment Appearance:: Appears stated age Attitude/Demeanor/Rapport: Engaged, Gracious Affect (typically observed): Accepting, Appropriate, Calm, Pleasant Orientation: : Oriented to Self, Oriented to Place, Oriented to  Time, Oriented to Situation Alcohol / Substance Use: Never Used Psych Involvement: No (comment)  Admission diagnosis:  COPD exacerbation ([place]) [J44.1] Patient Active Problem List   Diagnosis Date Noted  . COPD (chronic obstructive pulmonary disease) ([place]) 05/29/2017  . Pneumonia 06/28/2015  . COPD with exacerbation ([place]) 09/25/2014  . Depression 09/25/2014  . COPD exacerbation ([place]) 09/25/2014  . Severe recurrent major depression without psychotic features ([place]) 09/25/2014   PCP:  Ranae Plumber, [place] Pharmacy:   [facility] - [place], [place] [address] [place] [address] Phone: [phone] Fax: [phone]     Social Determinants of Health (SDOH) Interventions    Readmission Risk Interventions No flowsheet data  found.

## 2019-01-30 NOTE — Progress Notes (Signed)
[place] at [place] NAME: Ruben [name]    MR#:  [id number]  DATE OF BIRTH:  1955/10/24  SUBJECTIVE:   Patient states he is feeling a little bit better this morning.  He is still short of breath anytime he gets up to use the bathroom.  He endorses cough productive of yellow sputum.  No fevers or chills.  REVIEW OF SYSTEMS:  Review of Systems  Constitutional: Negative for chills and fever.  HENT: Negative for congestion and sore throat.   Eyes: Negative for blurred vision and double vision.  Respiratory: Positive for cough, sputum production, shortness of breath and wheezing.   Cardiovascular: Negative for chest pain, palpitations and leg swelling.  Gastrointestinal: Negative for nausea and vomiting.  Genitourinary: Negative for dysuria and urgency.  Musculoskeletal: Negative for back pain and neck pain.  Neurological: Negative for dizziness and headaches.  Psychiatric/Behavioral: Negative for depression. The patient is not nervous/anxious.     DRUG ALLERGIES:  No Known Allergies VITALS:  Blood pressure (!) 165/86, pulse (!) 55, temperature (!) 97.5 F (36.4 C), temperature source Oral, resp. rate 20, height 6' (1.829 m), weight 63.5 kg, SpO2 93 %. PHYSICAL EXAMINATION:  Physical Exam  GENERAL:  Laying in the bed with no acute distress.  HEENT: Head atraumatic, normocephalic. Pupils equal, round, reactive to light and accommodation. No scleral icterus. Extraocular muscles intact. Oropharynx and nasopharynx clear.  NECK:  Supple, no jugular venous distention. No thyroid enlargement. LUNGS: + Diminished breath sounds throughout all lung fields.  Diffuse expiratory wheezing present.   Mildly increased work of breathing.  Nasal cannula in place. CARDIOVASCULAR: RRR, S1, S2 normal. No murmurs, rubs, or gallops.  ABDOMEN: Soft, nontender, nondistended. Bowel sounds present.  EXTREMITIES: No pedal edema, cyanosis, or clubbing.  NEUROLOGIC:  CN 2-12 intact, no focal deficits. 5/5 muscle strength throughout all extremities. Sensation intact throughout. Gait not checked.  PSYCHIATRIC: The patient is alert and oriented x 3.  SKIN: No obvious rash, lesion, or ulcer.  LABORATORY PANEL:  Male CBC Recent Labs  Lab 01/29/19 1156  WBC 5.1  HGB 14.3  HCT 42.3  PLT 181   ------------------------------------------------------------------------------------------------------------------ Chemistries  Recent Labs  Lab 01/29/19 1156 01/30/19 0343  NA 138 138  K 3.7 4.7  CL 101 103  CO2 27 30  GLUCOSE 105* 156*  BUN 10 19  CREATININE 0.65 0.70  CALCIUM 8.8* 9.0  AST 15  --   ALT 11  --   ALKPHOS 69  --   BILITOT 0.7  --    RADIOLOGY:  No results found. ASSESSMENT AND PLAN:   Acute hypoxic respiratory failure secondary to COPD exacerbation- patient requiring 4 L O2 this morning. -Continue IV Solu-Medrol -Start Pulmicort nebs twice daily -Start Dulera twice daily -Add azithromycin -Duo nebs scheduled -Wean O2 as able  Tobacco use-smokes 1 pack/day -Nicotine patch as needed  Hypertension-BP has been elevated -Continue home metoprolol and diltiazem  Hyperlipidemia-stable -Continue home lipitor  Depression- stable -Continue home zoloft  All the records are reviewed and case discussed with Care Management/Social Worker. Management plans discussed with the patient, family and they are in agreement.  CODE STATUS: Full Code  TOTAL TIME TAKING CARE OF THIS PATIENT: 40 minutes.   More than 50% of the time was spent in counseling/coordination of care: YES  POSSIBLE D/C IN 1-2 DAYS, DEPENDING ON CLINICAL CONDITION.  Ruben [name] M.D on 01/30/2019 at 2:34 PM  Between 7am to 6pm -  Pager - [phone]  After 6pm go to [email or web address] - password [email or web address] [facility]  [facility]  Office  [phone]  CC: Primary care physician; Ranae Plumber, [place]  Note: This dictation was prepared with Dragon  dictation along with smaller phrase technology. Any transcriptional errors that result from this process are unintentional.

## 2019-01-31 ENCOUNTER — Inpatient Hospital Stay: Payer: Medicaid Other | Admitting: Certified Registered"

## 2019-01-31 ENCOUNTER — Other Ambulatory Visit: Payer: Self-pay

## 2019-01-31 ENCOUNTER — Encounter
Admission: EM | Disposition: A | Discharge status: Home Health | Payer: Medicaid Other | Source: Home / Self Care | Attending: Internal Medicine

## 2019-01-31 HISTORY — PX: ESOPHAGOGASTRODUODENOSCOPY: SHX5428

## 2019-01-31 SURGERY — EGD (ESOPHAGOGASTRODUODENOSCOPY)
Anesthesia: General

## 2019-01-31 MED ORDER — LIDOCAINE VISCOUS HCL 2 % MT SOLN
15.0000 mL | Freq: Once | OROMUCOSAL | Status: AC
  Administered 2019-01-31: 09:00:00 15 mL via ORAL
  Filled 2019-01-31 (×2): qty 15

## 2019-01-31 MED ORDER — FENTANYL CITRATE (PF) 100 MCG/2ML IJ SOLN: 25.0000 ug | INTRAMUSCULAR | Status: DC | PRN

## 2019-01-31 MED ORDER — PANTOPRAZOLE SODIUM 40 MG IV SOLR
40.0000 mg | Freq: Two times a day (BID) | INTRAVENOUS | Status: DC
  Administered 2019-01-31 (×2): 40 mg via INTRAVENOUS
  Filled 2019-01-31 (×2): qty 40

## 2019-01-31 MED ORDER — SUCCINYLCHOLINE CHLORIDE 20 MG/ML IJ SOLN
INTRAMUSCULAR | Status: DC | PRN
  Administered 2019-01-31: 100 mg via INTRAVENOUS

## 2019-01-31 MED ORDER — SODIUM CHLORIDE 0.9 % IV SOLN
INTRAVENOUS | Status: DC
  Administered 2019-01-31 – 2019-02-01 (×3): via INTRAVENOUS

## 2019-01-31 MED ORDER — PROPOFOL 10 MG/ML IV BOLUS
INTRAVENOUS | Status: AC
  Filled 2019-01-31: qty 20

## 2019-01-31 MED ORDER — METHYLPREDNISOLONE SODIUM SUCC 125 MG IJ SOLR
60.0000 mg | Freq: Two times a day (BID) | INTRAMUSCULAR | Status: DC
  Administered 2019-01-31 – 2019-02-01 (×2): 60 mg via INTRAVENOUS
  Filled 2019-01-31 (×2): qty 2

## 2019-01-31 MED ORDER — LABETALOL HCL 5 MG/ML IV SOLN
10.0000 mg | Freq: Four times a day (QID) | INTRAVENOUS | Status: DC | PRN
  Administered 2019-01-31: 10 mg via INTRAVENOUS
  Filled 2019-01-31: qty 4

## 2019-01-31 MED ORDER — LIDOCAINE HCL (PF) 2 % IJ SOLN
INTRAMUSCULAR | Status: DC | PRN
  Administered 2019-01-31: 60 mg via INTRADERMAL

## 2019-01-31 MED ORDER — SODIUM CHLORIDE 0.9 % IV SOLN
INTRAVENOUS | Status: DC | PRN
  Administered 2019-01-31: 15:00:00 via INTRAVENOUS

## 2019-01-31 MED ORDER — IPRATROPIUM-ALBUTEROL 0.5-2.5 (3) MG/3ML IN SOLN
3.0000 mL | Freq: Once | RESPIRATORY_TRACT | Status: AC
  Administered 2019-01-31: 3 mL via RESPIRATORY_TRACT

## 2019-01-31 MED ORDER — PROPOFOL 10 MG/ML IV BOLUS
INTRAVENOUS | Status: DC | PRN
  Administered 2019-01-31: 120 mg via INTRAVENOUS
  Administered 2019-01-31 (×4): 20 mg via INTRAVENOUS

## 2019-01-31 MED ORDER — IPRATROPIUM-ALBUTEROL 0.5-2.5 (3) MG/3ML IN SOLN
RESPIRATORY_TRACT | Status: AC
  Filled 2019-01-31: qty 3

## 2019-01-31 MED ORDER — ALUM & MAG HYDROXIDE-SIMETH 200-200-20 MG/5ML PO SUSP
30.0000 mL | Freq: Once | ORAL | Status: AC
  Administered 2019-01-31: 30 mL via ORAL
  Filled 2019-01-31: qty 30

## 2019-01-31 MED ORDER — ONDANSETRON HCL 4 MG/2ML IJ SOLN: 4.0000 mg | Freq: Once | INTRAMUSCULAR | Status: DC | PRN

## 2019-01-31 MED ORDER — ONDANSETRON HCL 4 MG/2ML IJ SOLN
INTRAMUSCULAR | Status: DC | PRN
  Administered 2019-01-31: 4 mg via INTRAVENOUS

## 2019-01-31 MED ORDER — MORPHINE SULFATE (PF) 2 MG/ML IV SOLN
2.0000 mg | INTRAVENOUS | Status: DC | PRN
  Administered 2019-01-31 – 2019-02-02 (×4): 2 mg via INTRAVENOUS
  Filled 2019-01-31 (×4): qty 1

## 2019-01-31 NOTE — Plan of Care (Addendum)
PMT note: Patient currently off floor at this time. Will re-attempt follow up tomorrow.

## 2019-01-31 NOTE — Anesthesia Preprocedure Evaluation (Signed)
Anesthesia Evaluation  Patient identified by MRN, date of birth, ID band Patient awake    Reviewed: Allergy & Precautions, H&P , NPO status , Patient's Chart, lab work & pertinent test results, reviewed documented beta blocker date and time   History of Anesthesia Complications Negative for: history of anesthetic complications  Airway Mallampati: II  TM Distance: >3 FB Neck ROM: full    Dental  (+) Dental Advidsory Given, Edentulous Upper, Edentulous Lower   Pulmonary shortness of breath, neg sleep apnea, COPD (currently on 4L O2 via [place]),  COPD inhaler and oxygen dependent, Recent URI , Current Smoker,    Pulmonary exam normal        Cardiovascular Exercise Tolerance: Good hypertension, (-) angina+ CAD, + Past MI and +CHF  (-) Cardiac Stents Normal cardiovascular exam(-) dysrhythmias (-) Valvular Problems/Murmurs     Neuro/Psych neg Seizures PSYCHIATRIC DISORDERS Depression CVA, No Residual Symptoms    GI/Hepatic Neg liver ROS, GERD  ,  Endo/Other  negative endocrine ROS  Renal/GU negative Renal ROS  negative genitourinary   Musculoskeletal   Abdominal   Peds  Hematology negative hematology ROS (+)   Anesthesia Other Findings Past Medical History: No date: Cancer ([place])     Comment:  lung cancer No date: CHF (congestive heart failure) (H[facility]) No date: COPD (chronic obstructive pulmonary disease) (H[facility]) No date: Coronary artery disease No date: Depression No date: Hypertension No date: Myocardial infarction ([place]) No date: Stroke ([place])   Reproductive/Obstetrics negative OB ROS                             Anesthesia Physical Anesthesia Plan  ASA: IV and emergent  Anesthesia Plan: General   Post-op Pain Management:    Induction: Intravenous  PONV Risk Score and Plan: 1 and Propofol infusion and TIVA  Airway Management Planned: Natural Airway and Nasal Cannula  Additional  Equipment:   Intra-op Plan:   Post-operative Plan:   Informed Consent: I have reviewed the patients History and Physical, chart, labs and discussed the procedure including the risks, benefits and alternatives for the proposed anesthesia with the patient or authorized representative who has indicated his/her understanding and acceptance.     Dental Advisory Given  Plan Discussed with: Anesthesiologist, CRNA and Surgeon  Anesthesia Plan Comments:         Anesthesia Quick Evaluation

## 2019-01-31 NOTE — Progress Notes (Signed)
PT Cancellation Note  Patient Details Name: Ruben [name] MRN: [id number] DOB: 03-01-1956   Cancelled Treatment:    Reason Eval/Treat Not Completed: Patient at procedure or test/unavailable. Pt unavailable at this time; out for procedure. Re attempt at a later date.    Larae Grooms, PTA 01/31/2019, 4:17 PM

## 2019-01-31 NOTE — Anesthesia Post-op Follow-up Note (Signed)
Anesthesia QCDR form completed.        

## 2019-01-31 NOTE — Transfer of Care (Signed)
Immediate Anesthesia Transfer of Care Note  Patient: Ruben [name]  Procedure(s) Performed: ESOPHAGOGASTRODUODENOSCOPY (EGD) (N/A )  Patient Location: PACU  Anesthesia Type:General  Level of Consciousness: awake, alert , oriented and patient cooperative  Airway & Oxygen Therapy: Patient Spontanous Breathing and Patient connected to nasal cannula oxygen  Post-op Assessment: Report given to RN and Post -op Vital signs reviewed and stable  Post vital signs: Reviewed and stable  Last Vitals:  Vitals Value Taken Time  BP 136/82 01/31/19 1542  Temp    Pulse 95 01/31/19 1543  Resp 15 01/31/19 1543  SpO2 91 % 01/31/19 1543  Vitals shown include unvalidated device data.  Last Pain:  Vitals:   01/31/19 1432  TempSrc: Tympanic  PainSc: 9       Patients Stated Pain Goal: 0 (84/83/50 7573)  Complications: No apparent anesthesia complications

## 2019-01-31 NOTE — Progress Notes (Signed)
[place] at [place] NAME: Ruben [name]    MR#:  [id number]  DATE OF BIRTH:  08/27/1955  SUBJECTIVE:   Patient states he is having very severe GERD that started yesterday evening around 5 PM.  He states he cannot eat or drink anything.  He is unable to take his medicines this morning.  He denies any vomiting.  REVIEW OF SYSTEMS:  Review of Systems  Constitutional: Negative for chills and fever.  HENT: Negative for congestion and sore throat.   Eyes: Negative for blurred vision and double vision.  Respiratory: Positive for cough, sputum production, shortness of breath and wheezing.   Cardiovascular: Negative for chest pain, palpitations and leg swelling.  Gastrointestinal: Negative for nausea and vomiting.  Genitourinary: Negative for dysuria and urgency.  Musculoskeletal: Negative for back pain and neck pain.  Neurological: Negative for dizziness and headaches.  Psychiatric/Behavioral: Negative for depression. The patient is not nervous/anxious.     DRUG ALLERGIES:  No Known Allergies VITALS:  Blood pressure (!) 168/73, pulse 70, temperature 97.7 F (36.5 C), temperature source Oral, resp. rate (!) 22, height 6' (1.829 m), weight 63.5 kg, SpO2 92 %. PHYSICAL EXAMINATION:  Physical Exam  GENERAL:  Laying in the bed with no acute distress. Thin-appearing. HEENT: Head atraumatic, normocephalic. Pupils equal, round, reactive to light and accommodation. No scleral icterus. Extraocular muscles intact. Oropharynx and nasopharynx clear.  NECK:  Supple, no jugular venous distention. No thyroid enlargement. LUNGS: + Diminished breath sounds throughout all lung fields.  Diffuse expiratory wheezing present, improved from previous exam.   Normal work of breathing.  Able to speak in full sentences. CARDIOVASCULAR: RRR, S1, S2 normal. No murmurs, rubs, or gallops.  ABDOMEN: Soft, nontender, nondistended. Bowel sounds present.  EXTREMITIES: No pedal  edema, cyanosis, or clubbing.  NEUROLOGIC: CN 2-12 intact, no focal deficits. 5/5 muscle strength throughout all extremities. Sensation intact throughout. Gait not checked.  PSYCHIATRIC: The patient is alert and oriented x 3.  SKIN: No obvious rash, lesion, or ulcer.  LABORATORY PANEL:  Male CBC Recent Labs  Lab 01/29/19 1156  WBC 5.1  HGB 14.3  HCT 42.3  PLT 181   ------------------------------------------------------------------------------------------------------------------ Chemistries  Recent Labs  Lab 01/29/19 1156 01/30/19 0343  NA 138 138  K 3.7 4.7  CL 101 103  CO2 27 30  GLUCOSE 105* 156*  BUN 10 19  CREATININE 0.65 0.70  CALCIUM 8.8* 9.0  AST 15  --   ALT 11  --   ALKPHOS 69  --   BILITOT 0.7  --    RADIOLOGY:  No results found. ASSESSMENT AND PLAN:   Acute hypoxic respiratory failure secondary to COPD exacerbation-improving.  Patient is breathing more comfortably this morning.  Currently on 3 L O2. -Taper IV Solu-Medrol to 60 mg twice daily -Continue Pulmicort nebs twice daily -Continue Dulera twice daily -Continue azithromycin -Duo nebs scheduled -Wean O2 as able -Palliative care consult for goals of care discussion.  Severe dysphagia- may be due to uncontrolled GERD, but concern for esophageal stricture vs esophageal cancer (patient is a heavy smoker). -Start IV PPI -Evaluated by SLP this morning and patient was unable to swallow enough to have a barium swallow -GI consult- plan for EGD this afternoon  Tobacco use- smokes 1 pack/day -Nicotine patch   Hypertension- BP has been elevated -Continue home metoprolol and diltiazem -IV labetalol prn  Hyperlipidemia-stable -Continue home lipitor  Depression- stable -Continue home zoloft  All the records  are reviewed and case discussed with Care Management/Social Worker. Management plans discussed with the patient, family and they are in agreement.  CODE STATUS: Full Code  TOTAL TIME TAKING  CARE OF THIS PATIENT: 40 minutes.   More than 50% of the time was spent in counseling/coordination of care: YES  POSSIBLE D/C IN 1-2 DAYS, DEPENDING ON CLINICAL CONDITION.  Berna Spare [name] M.D on 01/31/2019 at 1:49 PM  Between 7am to 6pm - Pager - [phone]  After 6pm go to [email or web address] - password [email or web address] [facility]  [facility]  Office  [phone]  CC: Primary care physician; Ranae Plumber, [place]  Note: This dictation was prepared with Dragon dictation along with smaller phrase technology. Any transcriptional errors that result from this process are unintentional.

## 2019-01-31 NOTE — TOC Progression Note (Signed)
Transition of Care [facility]) - Progression Note    Patient Details  Name: Ruben [name] MRN: [id number] Date of Birth: 25-Dec-1955  Transition of Care [facility]) CM/SW [place], LCSW Phone Number: 01/31/2019, 9:06 AM  Clinical Narrative: [facility], [facility], [facility], and [facility] unable to accept patient for home health needs.    Expected Discharge Plan: [place] Barriers to Discharge: Continued Medical Work up  Expected Discharge Plan and Services Expected Discharge Plan: [place] Choice: Home Health, Durable Medical Equipment(Maybe) Living arrangements for the past 2 months: Single Family Home                                       Social Determinants of Health (SDOH) Interventions    Readmission Risk Interventions No flowsheet data found.

## 2019-01-31 NOTE — Anesthesia Postprocedure Evaluation (Signed)
Anesthesia Post Note  Patient: Ruben [name]  Procedure(s) Performed: ESOPHAGOGASTRODUODENOSCOPY (EGD) (N/A )  Patient location during evaluation: PACU Anesthesia Type: General Level of consciousness: awake and alert Pain management: pain level controlled Vital Signs Assessment: post-procedure vital signs reviewed and stable Respiratory status: spontaneous breathing, nonlabored ventilation, respiratory function stable and patient connected to nasal cannula oxygen Cardiovascular status: blood pressure returned to baseline and stable Postop Assessment: no apparent nausea or vomiting Anesthetic complications: no     Last Vitals:  Vitals:   01/31/19 1554 01/31/19 1603  BP: 125/73 136/86  Pulse: 87 93  Resp: 20 19  Temp:  36.6 C  SpO2: 97% 96%    Last Pain:  Vitals:   01/31/19 1603  TempSrc:   PainSc: 0-No pain                 Martha Clan

## 2019-01-31 NOTE — Consult Note (Signed)
GI Inpatient Consult Note  Reason for Consult: Esophageal dysphagia    Attending Requesting Consult: Dr. Hyman Bible, MD  History of Present Illness: Ruben [name] is a 63 y.o. male seen for evaluation of esophageal dysphagia at the request of Dr. Hyman Bible, MD. Pt has a PMH of COPD, chronic congestive heart failure, lung cancer, CAD, Hx of CVA, and HTN who was admitted 10/19 for shortness of breath and decreased O2 sats. He was started on O2 via nasal cannula and given IV Solu-Medrol and nebs. He is currently on azithromycin. GI was consulted in the context of esophageal dysphagia. Per patient, he reports that anything he has tried to swallow (solids or liquids) has been getting stuck at the level of the mid sternum and then coming back up. This started around 5:00 PM yesterday. He reports that he had been receiving chronic acid suppression medication until it was stopped about one month ago. Over the past month he reports his pyrosis and acid regurgitation symptoms have been progressively getting worse. Over the last 20 hours he has been having significant dysphagia to solids and liquids. He has even been having difficulty with his own saliva. He has been spitting up his saliva into a cup at bedside. He was unable to sleep last night due to the severe retrosternal burning. He denies any epigastric abdominal pain. He does endorse a 13 lb unintentional weight loss over the past year. He was reporting seen by SLP this morning, but unable to do any testing due his significant dysphagia symptoms. He denies any prior endoscopic procedures. No known family history of esophageal or gastric malignancy. He was diagnosed with lung cancer in 2018, but refused any chemotherapy, radiation, or follow-up care. He was supposed to follow-up with Dr. Mortimer Fries for consideration of EBUS due to paratracheal adenopathy, but did not show for this appointment.    Last Colonoscopy: N/A Last Endoscopy: N/A   Past Medical History:   Past Medical History:  Diagnosis Date  . Cancer ([place])    lung cancer  . CHF (congestive heart failure) ([place])   . COPD (chronic obstructive pulmonary disease) ([place])   . Coronary artery disease   . Depression   . Hypertension   . Myocardial infarction ([place])   . Stroke [facility])     Problem List: Patient Active Problem List   Diagnosis Date Noted  . COPD (chronic obstructive pulmonary disease) ([place]) 05/29/2017  . Pneumonia 06/28/2015  . COPD with exacerbation ([place]) 09/25/2014  . Depression 09/25/2014  . COPD exacerbation ([place]) 09/25/2014  . Severe recurrent major depression without psychotic features ([place]) 09/25/2014    Past Surgical History: Past Surgical History:  Procedure Laterality Date  . CARDIAC CATHETERIZATION    . ELBOW SURGERY Left   . FLEXIBLE BRONCHOSCOPY Bilateral 08/27/2016   Procedure: FLEXIBLE BRONCHOSCOPY;  Surgeon: Allyne Gee, MD;  Location: [facility] ORS;  Service: Pulmonary;  Laterality: Bilateral;  . WRIST SURGERY Left     Allergies: No Known Allergies  Home Medications: Medications Prior to Admission  Medication Sig Dispense Refill Last Dose  . albuterol (PROVENTIL HFA;VENTOLIN HFA) 108 (90 Base) MCG/ACT inhaler Inhale 2 puffs into the lungs every 6 (six) hours as needed for wheezing or shortness of breath. 1 Inhaler 2 Unknown at PRN  . atorvastatin (LIPITOR) 10 MG tablet Take 1 tablet (10 mg total) by mouth daily. 30 tablet 0 Past Week at Unknown time  . clonazePAM (KLONOPIN) 0.5 MG tablet Take 1 tablet (0.5 mg total) by mouth  2 (two) times daily. 30 tablet 0 Past Week at Unknown time  . digoxin (LANOXIN) 0.25 MG tablet Take 1 tablet (0.25 mg total) by mouth daily. 30 tablet 0 Past Week at Unknown time  . furosemide (LASIX) 20 MG tablet Take 1 tablet (20 mg total) by mouth daily. 30 tablet 0 Past Week at Unknown time  . ipratropium-albuterol (DUONEB) 0.5-2.5 (3) MG/3ML SOLN Take 3 mLs by nebulization every 4 (four) hours as needed (shortness of breath).    Unknown at PRN  . metoprolol tartrate (LOPRESSOR) 25 MG tablet Take 1 tablet (25 mg total) by mouth 2 (two) times daily. 60 tablet 0 Past Week at Unknown time  . mometasone-formoterol (DULERA) 200-5 MCG/ACT AERO Inhale 2 puffs into the lungs 2 (two) times daily. 8.8 Inhaler 0 Past Week at Unknown time  . sertraline (ZOLOFT) 100 MG tablet Take 100 mg by mouth daily.    Past Week at Unknown time  . diltiazem (CARDIZEM CD) 300 MG 24 hr capsule Take 1 capsule (300 mg total) by mouth daily. (Patient not taking: Reported on 01/29/2019) 30 capsule 0 Not Taking at Unknown time  . escitalopram (LEXAPRO) 20 MG tablet Take 1 tablet (20 mg total) by mouth every morning. (Patient not taking: Reported on 04/25/2018) 30 tablet 0 Not Taking at Unknown time  . predniSONE (STERAPRED UNI-PAK 21 TAB) 10 MG (21) TBPK tablet Taper by 10 mg daily (Patient not taking: Reported on 01/29/2019) 21 tablet 0 Not Taking at Unknown time   Home medication reconciliation was completed with the patient.   Scheduled Inpatient Medications:   . atorvastatin  10 mg Oral Daily  . azithromycin  500 mg Oral Daily  . budesonide (PULMICORT) nebulizer solution  0.25 mg Nebulization BID  . clonazePAM  0.5 mg Oral BID  . digoxin  0.25 mg Oral Daily  . diltiazem  300 mg Oral Daily  . enoxaparin (LOVENOX) injection  40 mg Subcutaneous Q24H  . feeding supplement (ENSURE ENLIVE)  237 mL Oral TID BM  . furosemide  20 mg Oral Daily  . influenza vac split quadrivalent PF  0.5 mL Intramuscular Tomorrow-1000  . ipratropium-albuterol  3 mL Nebulization Q6H  . methylPREDNISolone (SOLU-MEDROL) injection  60 mg Intravenous Q12H  . metoprolol tartrate  25 mg Oral BID  . mometasone-formoterol  2 puff Inhalation BID  . nicotine  21 mg Transdermal Daily  . pantoprazole (PROTONIX) IV  40 mg Intravenous Q12H  . sertraline  50 mg Oral Daily    Continuous Inpatient Infusions:    PRN Inpatient Medications:  acetaminophen **OR** acetaminophen,  albuterol, HYDROcodone-acetaminophen, labetalol, ondansetron **OR** ondansetron (ZOFRAN) IV, senna-docusate  Family History: family history includes CAD in his mother; Diabetes in an other family member; Heart disease in an other family member; Hypertension in an other family member.  The patient's family history is negative for inflammatory bowel disorders, GI malignancy, or solid organ transplantation.  Social History:   reports that he has been smoking cigarettes. He has a 43.00 pack-year smoking history. He has never used smokeless tobacco. He reports current drug use. Frequency: 4.00 times per week. Drug: Marijuana. He reports that he does not drink alcohol. The patient denies ETOH, tobacco, or drug use.   Review of Systems: Constitutional: + Weight loss Eyes: No changes in vision. ENT: No oral lesions, sore throat.  GI: see HPI.  Heme/Lymph: No easy bruising.  CV: No chest pain.  GU: No hematuria.  Integumentary: No rashes.  Neuro: No headaches.  Psych:  No depression/anxiety.  Endocrine: No heat/cold intolerance.  Allergic/Immunologic: No urticaria.  Resp: No cough, SOB.  Musculoskeletal: No joint swelling.    Physical Examination: BP (!) 166/72 (BP Location: Right Arm)   Pulse 70   Temp 97.7 F (36.5 C) (Oral)   Resp (!) 22   Ht 6' (1.829 m)   Wt 63.5 kg   SpO2 92%   BMI 18.99 kg/m  Gen: appears in mild distress, alert and oriented x 4 HEENT: PEERLA, EOMI, Neck: supple, no JVD or thyromegaly Chest: on 4L O2 via [place], no accessory muscle use, coarse breath sounds bilaterally CV: RRR, no m/g/c/r Abd: soft, NT, ND, +BS in all four quadrants; no HSM, guarding, ridigity, or rebound tenderness Ext: no edema, well perfused with 2+ pulses, Skin: no rash or lesions noted Lymph: no LAD  Data: Lab Results  Component Value Date   WBC 5.1 01/29/2019   HGB 14.3 01/29/2019   HCT 42.3 01/29/2019   MCV 84.4 01/29/2019   PLT 181 01/29/2019   Recent Labs  Lab 01/29/19 1156   HGB 14.3   Lab Results  Component Value Date   NA 138 01/30/2019   K 4.7 01/30/2019   CL 103 01/30/2019   CO2 30 01/30/2019   BUN 19 01/30/2019   CREATININE 0.70 01/30/2019   Lab Results  Component Value Date   ALT 11 01/29/2019   AST 15 01/29/2019   ALKPHOS 69 01/29/2019   BILITOT 0.7 01/29/2019   No results for input(s): APTT, INR, PTT in the last 168 hours. Assessment/Plan:  63 y/o Caucasian male with a PMH of COPD, chronic congestive heart failure, lung cancer, CAD, Hx of CVA, and HTN admitted for acute COPD exacerbation on 10/19  1. Esophageal dysphagia - localized to midsternum 2. Unintentional weight loss - 13 lbs x 1 year 3. Tobacco abuse 4. Hx of lung cancer - no f/u care, no chemotherapy or radiation 5. Acute on chronic COPD - on 4L O2 via [place]  - Differential includes esophageal stricture, Schatzki ring, esophageal or gastric adenocarcinoma, esophageal dysmotility, bronchoesophageal fistula, eosinophilic esophagitis, reflux/infectious esophagitis - Advise emergent EGD with esophageal and gastric biopsies and potential esophageal dilatation. Patient is currently unable to tolerate any oral intake due to his dysphagia symptoms.  - Patient would be unable to tolerate liquid barium - I reviewed the risks (including bleeding, perforation, infection, anesthesia complications, cardiac/respiratory complications), benefits and alternatives of EGD. Patient consents to proceed.  - See procedure note for findings and further recommendations - Plan for EGD this afternoon with Dr. Alice Reichert    Thank you for the consult. Please call with questions or concerns.  Reeves Forth [facility] [phone] (Cell)

## 2019-01-31 NOTE — Anesthesia Procedure Notes (Signed)
Procedure Name: Intubation Date/Time: 01/31/2019 3:05 PM Performed by: Adalberto Ill, CRNA Pre-anesthesia Checklist: Patient identified, Emergency Drugs available, Suction available, Timeout performed and Patient being monitored Patient Re-evaluated:Patient Re-evaluated prior to induction Oxygen Delivery Method: Circle system utilized Preoxygenation: Pre-oxygenation with 100% oxygen Induction Type: IV induction and Rapid sequence Laryngoscope Size: [name] and 2 Grade View: Grade I Tube type: Oral Tube size: 7.5 mm Number of attempts: 1 (brief atraumatic ) Airway Equipment and Method: Stylet Placement Confirmation: ETT inserted through vocal cords under direct vision,  positive ETCO2 and breath sounds checked- equal and bilateral Secured at: 22 cm Tube secured with: Tape Dental Injury: Teeth and Oropharynx as per pre-operative assessment

## 2019-01-31 NOTE — Evaluation (Addendum)
Clinical/Bedside Swallow Evaluation Patient Details  Name: Ruben [name] MRN: [id number] Date of Birth: 1956-04-01  Today's Date: 01/31/2019 Time: SLP Start Time (ACUTE ONLY): 1000 SLP Stop Time (ACUTE ONLY): 1100 SLP Time Calculation (min) (ACUTE ONLY): 60 min  Past Medical History:  Past Medical History:  Diagnosis Date  . Cancer ([place])    lung cancer  . CHF (congestive heart failure) ([place])   . COPD (chronic obstructive pulmonary disease) ([place])   . Coronary artery disease   . Depression   . Hypertension   . Myocardial infarction ([place])   . Stroke [facility])    Past Surgical History:  Past Surgical History:  Procedure Laterality Date  . CARDIAC CATHETERIZATION    . COLONOSCOPY    . ELBOW SURGERY Left   . FLEXIBLE BRONCHOSCOPY Bilateral 08/27/2016   Procedure: FLEXIBLE BRONCHOSCOPY;  Surgeon: Allyne Gee, MD;  Location: [facility] ORS;  Service: Pulmonary;  Laterality: Bilateral;  . WRIST SURGERY Left    HPI:  Pt is a 63 y.o. male with a known history of ongoing tobacco abuse, GERD, depression, MI, stroke, COPD, chronic congestive heart failure, lung cancer, coronary disease, stroke, hypertension presented to the emergency room for shortness of breath and wheezing.  Pt stated he had been on a PPI but not taking it currently - "I wasn't given another prescription for it by the doctor". Pt stated he had had similar episodes of Esophageal dysmotility and discomfort but "none as bad as this". He stated he was eating beans yesterday then had severe discomfort and spitting of phlegm.   Assessment / Plan / Recommendation Clinical Impression  Pt appears to present w/ adequate oropharyngeal phase swallow function w/ no immediate, overt clinical s/s of aspiration noted post trials of ice chips and thin liquids. Pt exhibited clear vocal quality and no decline in respiratory effort. Laryngeal excursion appreciated during the swallow. However, immediately following the swallowing of trials, pt c/o  significant pain pointing to his mid-sternum area followed by spitting of phlegm(Moderate ammount). Nothing further was given to pt d/t his discomfort and apparent Esophageal Dysmotility w/ Regurgitation(this can increase risk for aspiration of Reflux material). OM exam appeared [facility]; missing dentition in poor condition. Pt fed self w/ setup support.   Recommend NPO status w/ GI consult for direct viewing of Esophagus(EGD) to r/o stricture(pt will not be able to consume enough barium for a barium study). When able to resume an oral diet, recommend aspiration and Reflux precautions; Crushing Pills in puree (alternative means for medications currently). NSG and MD updated.  SLP Visit Diagnosis: Dysphagia, unspecified (R13.10)(Esophageal phase deficits and dysmotility)    Aspiration Risk  Mild aspiration risk;Risk for inadequate nutrition/hydration(from Esophageal phase deficits)    Diet Recommendation  NPO until seen by GI - then the recommended diet per GI w/ general aspiration precautions; STRICT REFLUX precautions  Medication Administration: Via alternative means(Crushed in puree when able to resume)    Other  Recommendations Recommended Consults: Consider GI evaluation;Consider esophageal assessment(Dietician f/u) Oral Care Recommendations: Oral care BID;Patient independent with oral care Other Recommendations: (n/a)   Follow up Recommendations None      Frequency and Duration (n/a)  (n/a)       Prognosis Prognosis for Safe Diet Advancement: Fair Barriers to Reach Goals: (baseline Esophageal phase deficits)      Swallow Study   General Date of Onset: 01/29/19 HPI: Pt is a 63 y.o. male with a known history of ongoing tobacco abuse, GERD, depression, MI, stroke, COPD, chronic congestive  heart failure, lung cancer, coronary disease, stroke, hypertension presented to the emergency room for shortness of breath and wheezing.  Pt stated he had been on a PPI but not taking it currently - "I  wasn't given another prescription for it by the doctor". Pt stated he had had similar episodes of Esophageal dysmotility and discomfort but "none as bad as this".  Type of Study: Bedside Swallow Evaluation Previous Swallow Assessment: none reported Diet Prior to this Study: Regular;Thin liquids Temperature Spikes Noted: No(wbc 5.1) Respiratory Status: Nasal cannula(3 L) History of Recent Intubation: No Behavior/Cognition: Alert;Cooperative;Pleasant mood Oral Cavity Assessment: Excessive secretions Oral Care Completed by SLP: Recent completion by staff Oral Cavity - Dentition: Poor condition;Missing dentition Vision: Functional for self-feeding Self-Feeding Abilities: Able to feed self Patient Positioning: Upright in bed Baseline Vocal Quality: Normal Volitional Cough: Strong Volitional Swallow: Able to elicit    Oral/Motor/Sensory Function Overall Oral Motor/Sensory Function: Within functional limits   Ice Chips Ice chips: Within functional limits Presentation: Spoon;Self Fed(3 trials) Other Comments: increased spitting x1   Thin Liquid Thin Liquid: Within functional limits Presentation: Self Fed;Straw(2 trials) Other Comments: immediate swallows w/ clear vocal quality followed by immediate spitting of phlegm    Nectar Thick Nectar Thick Liquid: Not tested   Honey Thick Honey Thick Liquid: Not tested   Puree Puree: Not tested   Solid     Solid: Not tested       [name], MS, CCC-SLP Laketta Soderberg 01/31/2019,4:22 PM

## 2019-01-31 NOTE — Op Note (Signed)
[facility] Gastroenterology Patient Name: Ruben [name] Procedure Date: 01/31/2019 2:32 PM MRN: [id number] Account #: [id number] Date of Birth: 1955/05/22 Admit Type: Inpatient Age: 63 Room: [facility] ENDO ROOM 3 Gender: Male Note Status: Finalized Procedure:            Upper GI endoscopy Indications:          Removal of foreign body in the esophagus, Dysphagia,                        Abnormal CT of the GI tract Providers:            Benay Pike. Alice Reichert MD, MD Referring MD:         No Local Md, MD (Referring MD) Medicines:            Propofol per Anesthesia Complications:        No immediate complications. Estimated blood loss: None. Procedure:            Pre-Anesthesia Assessment:                       - The risks and benefits of the procedure and the                        sedation options and risks were discussed with the                        patient. All questions were answered and informed                        consent was obtained.                       - Patient identification and proposed procedure were                        verified prior to the procedure by the nurse. The                        procedure was verified in the procedure room.                       - ASA Grade Assessment: III - A patient with severe                        systemic disease.                       - After reviewing the risks and benefits, the patient                        was deemed in satisfactory condition to undergo the                        procedure.                       After obtaining informed consent, the endoscope was                        passed under direct vision. Throughout the procedure,  the patient's blood pressure, pulse, and oxygen                        saturations were monitored continuously. The Endoscope                        was introduced through the mouth, and advanced to the                        third part of duodenum. The  upper GI endoscopy was                        technically difficult and complex due to presence of                        food. Successful completion of the procedure was aided                        by removal of the foreign body. The patient tolerated                        the procedure well. Findings:      Food was found in the lower third of the esophagus. Removal of food was       accomplished. Removal of large amount of meat was achieved from the       lower esophagus out of the mouth while patient was intubated and       ventilated per anesthesia. Estimated blood loss: none.      One benign-appearing, intrinsic moderate stenosis was found in the       distal esophagus. This stenosis measured 1.3 cm (inner diameter) x 1 cm       (in length). The stenosis was traversed.      LA Grade B (one or more mucosal breaks greater than 5 mm, not extending       between the tops of two mucosal folds) esophagitis with no bleeding was       found in the distal esophagus.      The stomach was normal.      The examined duodenum was normal. Impression:           - Food in the lower third of the esophagus. Removal was                        successful.                       - Benign-appearing esophageal stenosis.                       - LA Grade B erosive esophagitis.                       - Normal stomach.                       - Normal examined duodenum. Recommendation:       - Return patient to hospital ward for ongoing care.                       - Full liquid diet.                       -  Pantoprazole 40mg  daily                       - NO further interventions as long as stay on full                        liquid/mechanical soft diet.                       GI sign off Procedure Code(s):    --- Professional ---                       [phone], Esophagogastroduodenoscopy, flexible, transoral;                        with removal of foreign body(s) Diagnosis Code(s):    --- Professional ---                        R93.3, Abnormal findings on diagnostic imaging of other                        parts of digestive tract                       R13.10, Dysphagia, unspecified                       T18.108A, Unspecified foreign body in esophagus causing                        other injury, initial encounter                       K20.8, Other esophagitis                       K22.2, Esophageal obstruction                       T18.128A, Food in esophagus causing other injury,                        initial encounter CPT copyright 2019 American Medical Association. All rights reserved. The codes documented in this report are preliminary and upon coder review may  be revised to meet current compliance requirements. Efrain Sella MD, MD 01/31/2019 3:38:55 PM This report has been signed electronically. Number of Addenda: 0 Note Initiated On: 01/31/2019 2:32 PM Estimated Blood Loss: Estimated blood loss: none.      [facility]

## 2019-02-01 DIAGNOSIS — Z515 Encounter for palliative care: Secondary | ICD-10-CM

## 2019-02-01 DIAGNOSIS — Z7189 Other specified counseling: Secondary | ICD-10-CM

## 2019-02-01 LAB — CBC
HCT: 40.2 % (ref 39.0–52.0)
Hemoglobin: 13 g/dL (ref 13.0–17.0)
MCH: 28.3 pg (ref 26.0–34.0)
MCHC: 32.3 g/dL (ref 30.0–36.0)
MCV: 87.6 fL (ref 80.0–100.0)
Platelets: 221 10*3/uL (ref 150–400)
RBC: 4.59 MIL/uL (ref 4.22–5.81)
RDW: 13.5 % (ref 11.5–15.5)
WBC: 12.6 10*3/uL — ABNORMAL HIGH (ref 4.0–10.5)
nRBC: 0 % (ref 0.0–0.2)

## 2019-02-01 LAB — BASIC METABOLIC PANEL
Anion gap: 5 (ref 5–15)
BUN: 26 mg/dL — ABNORMAL HIGH (ref 8–23)
CO2: 29 mmol/L (ref 22–32)
Calcium: 8.5 mg/dL — ABNORMAL LOW (ref 8.9–10.3)
Chloride: 107 mmol/L (ref 98–111)
Creatinine, Ser: 0.75 mg/dL (ref 0.61–1.24)
GFR calc Af Amer: >60 mL/min (ref >60)
GFR calc non Af Amer: >60 mL/min (ref >60)
Glucose, Bld: 141 mg/dL — ABNORMAL HIGH (ref 70–99)
Potassium: 4.6 mmol/L (ref 3.5–5.1)
Sodium: 141 mmol/L (ref 135–145)

## 2019-02-01 MED ORDER — PREDNISONE 50 MG PO TABS
50.0000 mg | ORAL_TABLET | Freq: Every day | ORAL | Status: DC
  Administered 2019-02-01 – 2019-02-03 (×3): 50 mg via ORAL
  Filled 2019-02-01 (×3): qty 1

## 2019-02-01 MED ORDER — ADULT MULTIVITAMIN W/MINERALS CH
1.0000 | ORAL_TABLET | Freq: Every day | ORAL | Status: DC
  Administered 2019-02-02 – 2019-02-03 (×2): 1 via ORAL
  Filled 2019-02-01 (×2): qty 1

## 2019-02-01 MED ORDER — SERTRALINE HCL 100 MG PO TABS
100.0000 mg | ORAL_TABLET | Freq: Every day | ORAL | Status: DC
  Administered 2019-02-02 – 2019-02-03 (×2): 100 mg via ORAL
  Filled 2019-02-01: qty 1
  Filled 2019-02-01 (×2): qty 2
  Filled 2019-02-01: qty 1

## 2019-02-01 MED ORDER — PANTOPRAZOLE SODIUM 40 MG PO TBEC
40.0000 mg | DELAYED_RELEASE_TABLET | Freq: Every day | ORAL | Status: DC
  Administered 2019-02-01 – 2019-02-03 (×3): 40 mg via ORAL
  Filled 2019-02-01 (×3): qty 1

## 2019-02-01 MED ORDER — SERTRALINE HCL 50 MG PO TABS: 75.0000 mg | ORAL_TABLET | Freq: Every day | ORAL | Status: DC

## 2019-02-01 NOTE — Consult Note (Signed)
Consultation Note Date: 02/01/2019   Patient Name: Ruben [name]  DOB: May 12, 1955  MRN: [id number]  Age / Sex: 63 y.o., male  PCP: Ranae Plumber, [place] Referring Physician: Sela Hua, MD  Reason for Consultation: Establishing goals of care  HPI/Patient Profile: Ruben [name]  is a 63 y.o. male with a known history of COPD, chronic congestive heart failure, lung cancer, coronary disease, stroke, hypertension presented to the emergency room for shortness of breath and wheezing.  Is been going on for the last 2 days.  Clinical Assessment and Goals of Care: Patient is sitting in bed watching tv. He is divorced with one daughter who is 41 years old. He lives alone. He states he has several siblings, none of which get along with each other. He states he would want his ex-wife to be his surrogate decision maker if he could not make decisions. Consult placed to chaplain.  Functionally, he uses no assistive devices. He does not use O2 at home, and states he is not supposed to as far as he is aware. He states as long as he moves slowly, it is not a problem.  [name] states he was supposed to follow up with oncology for lung cancer a year or 2 ago, but did not because he does not have transportation.   We discussed his diagnosis, prognosis, and GOC.  A detailed discussion was had today regarding advanced directives.  Concepts specific to code status, artifical feeding and hydration, IV antibiotics and rehospitalization were discussed.  The difference between an aggressive medical intervention path and a comfort care path was discussed.  Values and goals of care important to patient and family were attempted to be elicited.  Discussed limitations of medical interventions to prolong quality of life in some situations and discussed the concept of human mortality.  [name] states he was supposed to follow up  with oncology for lung cancer a year or 2 ago, but did not because he does not have transportation. He tells me his quality of life is good, and he wants to try to live as long as possible for his child. He would want to be placed on a ventilator, but would not want a trach. He is amenable to his current diet, and feels better since his dilation. He would not want a feeding tube. He  is unsure about code status.        SUMMARY OF RECOMMENDATIONS   - Recommend palliative at D/C. - Recommend f/u with his PCP to determine need and arrangement for f/u with oncology. Will need transportation assistance.  - Full code/full scope at this time.  - Chaplain consult placed for HPOA papers.   Prognosis:   Unable to determine   Discharge Planning: To Be Determined      Primary Diagnoses: Present on Admission: . COPD exacerbation ([place])   I have reviewed the medical record, interviewed the patient and family, and examined the patient. The following aspects are pertinent.  Past Medical History:  Diagnosis Date  .  Cancer ([place])    lung cancer  . CHF (congestive heart failure) ([place])   . COPD (chronic obstructive pulmonary disease) ([place])   . Coronary artery disease   . Depression   . Hypertension   . Myocardial infarction ([place])   . Stroke [facility])    Social History   Socioeconomic History  . Marital status: Legally Separated    Spouse name: Not on file  . Number of children: Not on file  . Years of education: Not on file  . Highest education level: Not on file  Occupational History  . Occupation: unemployed  Social Needs  . Financial resource strain: Hard  . Food insecurity    Worry: Patient refused    Inability: Patient refused  . Transportation needs    Medical: Patient refused    Non-medical: Patient refused  Tobacco Use  . Smoking status: Current Every Day Smoker    Packs/day: 1.00    Years: 43.00    Pack years: 43.00    Types: Cigarettes  . Smokeless tobacco: Never Used   Substance and Sexual Activity  . Alcohol use: No  . Drug use: Yes    Frequency: 4.0 times per week    Types: Marijuana  . Sexual activity: Yes  Lifestyle  . Physical activity    Days per week: Patient refused    Minutes per session: Patient refused  . Stress: Patient refused  Relationships  . Social Herbalist on phone: Patient refused    Gets together: Patient refused    Attends religious service: Patient refused    Active member of club or organization: Patient refused    Attends meetings of clubs or organizations: Patient refused    Relationship status: Patient refused  Other Topics Concern  . Not on file  Social History Narrative   Independent at baseline.   Family History  Problem Relation Age of Onset  . Heart disease Other   . Hypertension Other   . Diabetes Other   . CAD Mother    Scheduled Meds: . atorvastatin  10 mg Oral Daily  . azithromycin  500 mg Oral Daily  . budesonide (PULMICORT) nebulizer solution  0.25 mg Nebulization BID  . clonazePAM  0.5 mg Oral BID  . digoxin  0.25 mg Oral Daily  . diltiazem  300 mg Oral Daily  . enoxaparin (LOVENOX) injection  40 mg Subcutaneous Q24H  . feeding supplement (ENSURE ENLIVE)  237 mL Oral TID BM  . furosemide  20 mg Oral Daily  . ipratropium-albuterol  3 mL Nebulization Q6H  . metoprolol tartrate  25 mg Oral BID  . mometasone-formoterol  2 puff Inhalation BID  . [START ON 02/02/2019] multivitamin with minerals  1 tablet Oral Daily  . nicotine  21 mg Transdermal Daily  . pantoprazole  40 mg Oral Daily  . predniSONE  50 mg Oral Q breakfast  . [START ON 02/02/2019] sertraline  100 mg Oral Daily   Continuous Infusions: . sodium chloride 20 mL/hr at 02/01/19 0721   PRN Meds:.acetaminophen **OR** acetaminophen, albuterol, labetalol, morphine injection, ondansetron **OR** ondansetron (ZOFRAN) IV, senna-docusate Medications Prior to Admission:  Prior to Admission medications   Medication Sig Start Date End  Date Taking? Authorizing Provider  albuterol (PROVENTIL HFA;VENTOLIN HFA) 108 (90 Base) MCG/ACT inhaler Inhale 2 puffs into the lungs every 6 (six) hours as needed for wheezing or shortness of breath. 02/19/18  Yes Epifanio Lesches, MD  atorvastatin (LIPITOR) 10 MG tablet Take 1 tablet (10  mg total) by mouth daily. 02/20/18  Yes Epifanio Lesches, MD  clonazePAM (KLONOPIN) 0.5 MG tablet Take 1 tablet (0.5 mg total) by mouth 2 (two) times daily. 02/19/18  Yes Epifanio Lesches, MD  digoxin (LANOXIN) 0.25 MG tablet Take 1 tablet (0.25 mg total) by mouth daily. 02/19/18  Yes Epifanio Lesches, MD  furosemide (LASIX) 20 MG tablet Take 1 tablet (20 mg total) by mouth daily. 04/28/18 04/28/19 Yes Sudini, Alveta Heimlich, MD  ipratropium-albuterol (DUONEB) 0.5-2.5 (3) MG/3ML SOLN Take 3 mLs by nebulization every 4 (four) hours as needed (shortness of breath).   Yes [provider]  metoprolol tartrate (LOPRESSOR) 25 MG tablet Take 1 tablet (25 mg total) by mouth 2 (two) times daily. 02/19/18  Yes Epifanio Lesches, MD  mometasone-formoterol (DULERA) 200-5 MCG/ACT AERO Inhale 2 puffs into the lungs 2 (two) times daily. 02/19/18  Yes Epifanio Lesches, MD  sertraline (ZOLOFT) 100 MG tablet Take 100 mg by mouth daily.    Yes [provider]  diltiazem (CARDIZEM CD) 300 MG 24 hr capsule Take 1 capsule (300 mg total) by mouth daily. Patient not taking: Reported on 01/29/2019 02/20/18   Epifanio Lesches, MD  escitalopram (LEXAPRO) 20 MG tablet Take 1 tablet (20 mg total) by mouth every morning. Patient not taking: Reported on 04/25/2018 02/19/18   Epifanio Lesches, MD  predniSONE (STERAPRED UNI-PAK 21 TAB) 10 MG (21) TBPK tablet Taper by 10 mg daily Patient not taking: Reported on 01/29/2019 04/28/18   Hillary Bow, MD   No Known Allergies Review of Systems  Respiratory: Positive for shortness of breath.     Physical Exam Pulmonary:     Effort: Pulmonary effort is normal.   Skin:    General: Skin is warm and dry.  Neurological:     Mental Status: He is alert.     Vital Signs: BP (!) 166/71 (BP Location: Left Arm)   Pulse 66   Temp 98 F (36.7 C) (Oral)   Resp 20   Ht 6' (1.829 m)   Wt 63.5 kg   SpO2 94%   BMI 18.99 kg/m  Pain Scale: 0-10 POSS *See Group Information*: 1-Acceptable,Awake and alert Pain Score: 0-No pain   SpO2: SpO2: 94 % O2 Device:SpO2: 94 % O2 Flow Rate: .O2 Flow Rate (L/min): 3 L/min  IO: Intake/output summary:   Intake/Output Summary (Last 24 hours) at 02/01/2019 1407 Last data filed at 02/01/2019 1300 Gross per 24 hour  Intake 453.27 ml  Output 500 ml  Net -46.73 ml    LBM: Last BM Date: 01/31/19 Baseline Weight: Weight: 63.5 kg Most recent weight: Weight: 63.5 kg     Palliative Assessment/Data: PT to eval.      Time In: 1:10 Time Out: 2:00 Time Total: 50 min Greater than 50%  of this time was spent counseling and coordinating care related to the above assessment and plan.  Signed by: Asencion Gowda, NP   Please contact Palliative Medicine Team phone at [phone] for questions and concerns.  For individual provider: See Shea Evans

## 2019-02-01 NOTE — Progress Notes (Addendum)
[place] [facility] [place] NAME: Ibrahem Volkman    MR#:  [id number]  DATE OF BIRTH:  [birth year]  SUBJECTIVE:   Patient states he is feeling much better today after his EGD yesterday. His chest pain and GERD have completely resolved.  He states his shortness of breath is a little bit better today than yesterday.  He is still getting winded whenever he tries to move around.  REVIEW OF SYSTEMS:  Review of Systems  Constitutional: Negative for chills and fever.  HENT: Negative for congestion and sore throat.   Eyes: Negative for blurred vision and double vision.  Respiratory: Positive for cough, sputum production, shortness of breath and wheezing.   Cardiovascular: Negative for chest pain, palpitations and leg swelling.  Gastrointestinal: Negative for nausea and vomiting.  Genitourinary: Negative for dysuria and urgency.  Musculoskeletal: Negative for back pain and neck pain.  Neurological: Negative for dizziness and headaches.  Psychiatric/Behavioral: Negative for depression. The patient is not nervous/anxious.    DRUG ALLERGIES:  No Known Allergies VITALS:  Blood pressure (!) 166/71, pulse 66, temperature 98 F (36.7 C), temperature source Oral, resp. rate 20, height 6' (1.829 m), weight 63.5 kg, SpO2 94 %. PHYSICAL EXAMINATION:  Physical Exam  GENERAL:  Laying in the bed with no acute distress. Thin-appearing. HEENT: Head atraumatic, normocephalic. Pupils equal, round, reactive to light and accommodation. No scleral icterus. Extraocular muscles intact. Oropharynx and nasopharynx clear.  NECK:  Supple, no jugular venous distention. No thyroid enlargement. LUNGS: + Scattered expiratory wheezing present.  Nasal cannula in place. Normal work of breathing.  Able to speak in full sentences. CARDIOVASCULAR: RRR, S1, S2 normal. No murmurs, rubs, or gallops.  ABDOMEN: Soft, nontender, nondistended. Bowel sounds present.  EXTREMITIES: No pedal edema,  cyanosis, or clubbing.  NEUROLOGIC: CN 2-12 intact, no focal deficits. 5/5 muscle strength throughout all extremities. Sensation intact throughout. Gait not checked.  PSYCHIATRIC: The patient is alert and oriented x 3.  SKIN: No obvious rash, lesion, or ulcer.  LABORATORY PANEL:  Male CBC Recent Labs  Lab 02/01/19 0346  WBC 12.6*  HGB 13.0  HCT 40.2  PLT 221   ------------------------------------------------------------------------------------------------------------------ Chemistries  Recent Labs  Lab 01/29/19 1156  02/01/19 0346  NA 138   < > 141  K 3.7   < > 4.6  CL 101   < > 107  CO2 27   < > 29  GLUCOSE 105*   < > 141*  BUN 10   < > 26*  CREATININE 0.65   < > 0.75  CALCIUM 8.8*   < > 8.5*  AST 15  --   --   ALT 11  --   --   ALKPHOS 69  --   --   BILITOT 0.7  --   --    < > = values in this interval not displayed.   RADIOLOGY:  No results found. ASSESSMENT AND PLAN:   Acute hypoxic respiratory failure secondary to COPD exacerbation-improving.  Patient is breathing more comfortably this morning.  Currently on 2 L O2. -Switch from IV Solu-Medrol to prednisone today -Continue Pulmicort nebs twice daily -Continue Dulera twice daily -Continue azithromycin -Duonebs scheduled -Wean O2 as able -Palliative care consult for goals of care discussion.  Severe dysphagia- due to esophageal stricture and a large amount of meat stuck in the esophagus. -EGD 10/21 with benign-appearing moderate stenosis in the distal esophagus with a large amount of meat stuck in the  lower esophagus -Switch from IV Protonix to Protonix 40 mg daily -SLP following -Continue soft diet for now  Leukocytosis-likely due to steroid use.  No signs of infection. -Recheck CBC in the morning  Tobacco use- smokes 1 pack/day -Nicotine patch   Hypertension- BP has been elevated -Continue home metoprolol and diltiazem -IV labetalol prn  Hyperlipidemia-stable -Continue home lipitor  Depression-  patient endorsing some worsening anxiety and depression recently -Increase home Zoloft to 100 mg daily  All the records are reviewed and case discussed with Care Management/Social Worker. Management plans discussed with the patient, family and they are in agreement.  CODE STATUS: Full Code  TOTAL TIME TAKING CARE OF THIS PATIENT: 38 minutes.   More than 50% of the time was spent in counseling/coordination of care: YES  POSSIBLE D/C IN 1-2 DAYS, DEPENDING ON CLINICAL CONDITION.  Berna Spare  M.D on 02/01/2019 at 12:24 PM  Between 7am to 6pm - Pager [phone]  After 6pm go to [email or web address] - password [email or web address] [facility]  [employer] [place] [facility]  Office  [phone]  CC: Primary care physician; Ranae Plumber, [place]  Note: This dictation was prepared with Dragon dictation along with smaller phrase technology. Any transcriptional errors that result from this process are unintentional.

## 2019-02-01 NOTE — Progress Notes (Signed)
SATURATION QUALIFICATIONS: (This note is used to comply with regulatory documentation for home oxygen)  Patient Saturations on Room Air at Rest = 93%  Patient Saturations on Room Air while Ambulating = 88%  Patient Saturations on 2 Liters of oxygen while Ambulating = 90%

## 2019-02-01 NOTE — Progress Notes (Signed)
Initial Nutrition Assessment  DOCUMENTATION CODES:   Severe malnutrition in context of chronic illness  INTERVENTION:   Ensure Enlive po BID, each supplement provides 350 kcal and 20 grams of protein  Magic cup TID with meals, each supplement provides 290 kcal and 9 grams of protein  MVI daily   Dysphagia 3 diet   NUTRITION DIAGNOSIS:   Severe Malnutrition related to chronic illness(COPD, CHF) as evidenced by severe fat depletion, severe muscle depletion.  GOAL:   Patient will meet greater than or equal to 90% of their needs  MONITOR:   PO intake, Labs, Weight trends, Skin, I & O's  REASON FOR ASSESSMENT:   Consult Assessment of nutrition requirement/status  ASSESSMENT:   63 y.o. male with a history of lung cancer, CHF, COPD, depression, hypertension, MI, CVA who is admitted with COPD exacerbation.   Met with pt in room today. Pt is well known to nutrition department and this RD from multiple previous admits. Pt reports good appetite and oral intake at baseline and in hospital; pt eating 100% of meals currently. Pt reports food insecurity at home. Pt reports that he only gets social security and that after he pays his bills, he often does not have money left to buy food. Pt was participating in the [facility] program at one point, but he was not able to make it to re-certify and he was dropped. Pt loves Ensure but cannot afford this at home. Per chart, pt has lost ~50lbs over the past 4 years. Pt has been weight stable for the past year. RD will add supplements and MVI to help pt meet his estimated needs. Spoke with case management; they will provide patient with some resources to assist with his food insecurity.     Medications reviewed and include: azithromycin, lovenox, lasix, nicotine, protonix, NaCl _0 /hr  Labs reviewed: BUN 26(H) Wbc- 12.6(H)  NUTRITION - FOCUSED PHYSICAL EXAM:    Most Recent Value  Orbital Region  Moderate depletion  Upper Arm Region  Severe  depletion  Thoracic and Lumbar Region  Severe depletion  Buccal Region  Moderate depletion  Temple Region  Severe depletion  Clavicle Bone Region  Severe depletion  Clavicle and Acromion Bone Region  Severe depletion  Scapular Bone Region  Severe depletion  Dorsal Hand  Severe depletion  Patellar Region  Severe depletion  Anterior Thigh Region  Severe depletion  Posterior Calf Region  Severe depletion  Edema (RD Assessment)  None  Hair  Reviewed  Eyes  Reviewed  Mouth  Reviewed  Skin  Reviewed  Nails  Reviewed     Diet Order:   Diet Order            DIET DYS 3 Room service appropriate? Yes; Fluid consistency: Thin  Diet effective now             EDUCATION NEEDS:   Education needs have been addressed  Skin:  Skin Assessment: Reviewed RN Assessment(ecchymosis)  Last BM:  10/20  Height:   Ht Readings from Last 1 Encounters:  01/31/19 6' (1.829 m)    Weight:   Wt Readings from Last 1 Encounters:  01/31/19 63.5 kg    Ideal Body Weight:  80.9 kg  BMI:  Body mass index is 18.99 kg/m.  Estimated Nutritional Needs:   Kcal:  1900-2200kcal/day  Protein:  95-110g/day  Fluid:  >1.9L/day  Koleen Distance MS, RD, LDN Pager #- [phone] Office#- [phone] After Hours Pager: [phone]

## 2019-02-01 NOTE — Progress Notes (Signed)
Physical Therapy Treatment Patient Details Name: Ruben [name] MRN: [id number] DOB: [birth year] Today's Date: 02/01/2019    History of Present Illness Ruben [name] is a 39yoM who comes to [facility] with progressive SOB. Covid test negative. Pt admitted with COPD exacerbation.    PT Comments    Pt in bed, stating he feels like he is breathing worse today.  Sats 98% on 3 lpm.  Refuses gait but agrees to up in chair.  Transitions independent.  Sats 93% after transfer.  He agrees to seated ex but stops after a minimal amount in order to void.  Holds urinal but he stated it can take him 15 minutes to start his stream.  Offered to stand but he stated it did not matter sitting or standing.  Waited for several minutes but he stated he needed further time.  Encouraged continued seated HEP and to remain up as long as he was able.  Voiced understanding.  Continued to decline gait today.   Follow Up Recommendations  Home health PT     Equipment Recommendations  Cane    Recommendations for Other Services       Precautions / Restrictions Precautions Precautions: Fall Restrictions Weight Bearing Restrictions: No    Mobility  Bed Mobility Overal bed mobility: Independent                Transfers Overall transfer level: Independent                  Ambulation/Gait             General Gait Details: refused   Stairs             Wheelchair Mobility    Modified Rankin (Stroke Patients Only)       Balance Overall balance assessment: History of Falls;Mild deficits observed, not formally tested                                          Cognition Arousal/Alertness: Awake/alert Behavior During Therapy: Anxious Overall Cognitive Status: Within Functional Limits for tasks assessed                                        Exercises Other Exercises Other Exercises: seated AROM LAQ, marches, ankle pumps x 10    General Comments         Pertinent Vitals/Pain Pain Assessment: No/denies pain    Home Living                      Prior Function            PT Goals (current goals can now be found in the care plan section) Progress towards PT goals: Progressing toward goals    Frequency    Min 2X/week      PT Plan Current plan remains appropriate    Co-evaluation              AM-PAC PT "6 Clicks" Mobility   Outcome Measure  Help needed turning from your back to your side while in a flat bed without using bedrails?: None Help needed moving from lying on your back to sitting on the side of a flat bed without using bedrails?: None Help needed moving to and from a bed to a  chair (including a wheelchair)?: None Help needed standing up from a chair using your arms (e.g., wheelchair or bedside chair)?: None Help needed to walk in hospital room?: A Little Help needed climbing 3-5 steps with a railing? : A Little 6 Click Score: 22    End of Session Equipment Utilized During Treatment: Oxygen Activity Tolerance: Other (comment) Patient left: in chair;with call bell/phone within reach         Time: 1025-1033 PT Time Calculation (min) (ACUTE ONLY): 8 min  Charges:  $Therapeutic Activity: 8-22 mins                    Chesley Noon, PTA 02/01/19, 11:21 AM

## 2019-02-01 NOTE — TOC Progression Note (Addendum)
Transition of Care [facility]) - Progression Note    Patient Details  Name: Ruben [name] MRN: [id number] Date of Birth: [birth year]  Transition of [facility]) CM/SW [place], LCSW Phone Number: 02/01/2019, 12:10 PM  Clinical Narrative:  Notified patient that no home health agency was able to accept his referral. Patient expressed understanding. Provided list of outpatient PT clinics. Sent secure email to [facility] representative with information for cane order. Also included that patient may need home oxygen but RN is trying to wean him to room air. He is down to 2 L from 3 L this morning.   1:53 pm: Gave patient community resources list including address and phone number for [employer] which has a food bank.  4:52 pm: Met with patient to discuss Medicaid transportation. Patient feeling very depressed due to his financial situation. He feels that no one tries to help him when he needs it. He stated if it weren't for his 80 year old daughter that he sees most weekends, he doesn't feel like he would be here. Patient is a [employer] and is working hard to keep his faith. Patient frustrated that his psych meds have never been adjusted in 3 years. Per MD, Zoloft was increased today. She will consult psych to see him as well.  Expected Discharge Plan: [place] Barriers to Discharge: Continued Medical Work up  Expected Discharge Plan and Services Expected Discharge Plan: [place] Choice: Home Health, Durable Medical Equipment(Maybe) Living arrangements for the past 2 months: Single Family Home                                       Social Determinants of Health (SDOH) Interventions    Readmission Risk Interventions No flowsheet data found.

## 2019-02-02 DIAGNOSIS — E43 Unspecified severe protein-calorie malnutrition: Secondary | ICD-10-CM | POA: Insufficient documentation

## 2019-02-02 DIAGNOSIS — F331 Major depressive disorder, recurrent, moderate: Secondary | ICD-10-CM

## 2019-02-02 LAB — CBC
HCT: 39.7 % (ref 39.0–52.0)
Hemoglobin: 13.1 g/dL (ref 13.0–17.0)
MCH: 28.7 pg (ref 26.0–34.0)
MCHC: 33 g/dL (ref 30.0–36.0)
MCV: 86.9 fL (ref 80.0–100.0)
Platelets: 218 10*3/uL (ref 150–400)
RBC: 4.57 MIL/uL (ref 4.22–5.81)
RDW: 13.2 % (ref 11.5–15.5)
WBC: 11.4 10*3/uL — ABNORMAL HIGH (ref 4.0–10.5)
nRBC: 0 % (ref 0.0–0.2)

## 2019-02-02 LAB — TROPONIN I (HIGH SENSITIVITY): Troponin I (High Sensitivity): 10 ng/L (ref <18)

## 2019-02-02 LAB — DIGOXIN LEVEL: Digoxin Level: 0.5 ng/mL — ABNORMAL LOW (ref 0.8–2.0)

## 2019-02-02 MED ORDER — DIAZEPAM 5 MG PO TABS
5.0000 mg | ORAL_TABLET | Freq: Two times a day (BID) | ORAL | Status: DC
  Administered 2019-02-02 – 2019-02-03 (×2): 5 mg via ORAL
  Filled 2019-02-02 (×2): qty 1

## 2019-02-02 NOTE — Progress Notes (Signed)
New referral for [facility] community Palliative to follow at home receive from [facility]. Patient information given to referral. Flo Shanks BSN, RN, [place] [phone]

## 2019-02-02 NOTE — Consult Note (Signed)
[facility] Consult   Reason for Consult: Depression and anxiety Referring Physician: Dr. Nicola Girt Patient Identification: Ruben [name] MRN:  [id number] Principal Diagnosis: <principal problem not specified> Diagnosis:  Active Problems:   COPD exacerbation ([place])   Protein-calorie malnutrition, severe   Total Time spent with patient: 45 minutes  Subjective:   Ruben [name] is a 62 y.o. male patient admitted with COPD exacerbation.  HPI: Patient is a 62 year old male with a history of COPD who presents due to COPD exacerbation.  Patient was noted to be depressed and anxious during his stay here.  Upon evaluation patient was spoken to and reports feeling depressed and anxious for the past several weeks.  Patient states that being in the hospital does not help the situation and he is reminded of the multiple chronic medical problems he has including his COPD and cancer.  Patient is saddened by this but remains future oriented and hopeful.  Patient states that his mood has been affecting his sleep to the point where he is having frequent nighttime awakenings.  Patient also states that he is having a difficult time with his appetite.  Despite this low mood patient denies any suicidal ideation either currently or in the past.  No evidence of psychotic or manic symptoms as well. Patient reports that what keeps him going is both his faith in [place] as well as his 55 year old daughter named Remi Deter.  Patient also lists familial strife as well as interpersonal difficulties with his ex-wife as contributing to his stress but states that he is able to find enjoyment in spending time with his daughter.    Past Psychiatric History: Patient was recently put on medications for anxiety and depression due to his mood.  Dose of Zoloft was increased to 100 mg during this medical admission. Risk to Self:  No Risk to Others:  No Prior Inpatient Therapy:  No Prior Outpatient Therapy:   Yes    Past Medical History:  Past Medical History:  Diagnosis Date  . Cancer ([place])    lung cancer  . CHF (congestive heart failure) ([place])   . COPD (chronic obstructive pulmonary disease) ([place])   . Coronary artery disease   . Depression   . Hypertension   . Myocardial infarction ([place])   . Stroke [facility])     Past Surgical History:  Procedure Laterality Date  . CARDIAC CATHETERIZATION    . COLONOSCOPY    . ELBOW SURGERY Left   . ESOPHAGOGASTRODUODENOSCOPY N/A 01/31/2019   Procedure: ESOPHAGOGASTRODUODENOSCOPY (EGD);  Surgeon: Toledo, Benay Pike, MD;  Location: [facility] ENDOSCOPY;  Service: Gastroenterology;  Laterality: N/A;  . FLEXIBLE BRONCHOSCOPY Bilateral 08/27/2016   Procedure: FLEXIBLE BRONCHOSCOPY;  Surgeon: Allyne Gee, MD;  Location: [facility] ORS;  Service: Pulmonary;  Laterality: Bilateral;  . WRIST SURGERY Left    Family History:  Family History  Problem Relation Age of Onset  . Heart disease Other   . Hypertension Other   . Diabetes Other   . CAD Mother    Family Psychiatric  History: Denies Social History:  Social History   Substance and Sexual Activity  Alcohol Use No     Social History   Substance and Sexual Activity  Drug Use Yes  . Frequency: 4.0 times per week  . Types: Marijuana    Social History   Socioeconomic History  . Marital status: Legally Separated    Spouse name: Not on file  . Number of children: Not on file  . Years  of education: Not on file  . Highest education level: Not on file  Occupational History  . Occupation: unemployed  Social Needs  . Financial resource strain: Hard  . Food insecurity    Worry: Patient refused    Inability: Patient refused  . Transportation needs    Medical: Patient refused    Non-medical: Patient refused  Tobacco Use  . Smoking status: Current Every Day Smoker    Packs/day: 1.00    Years: 43.00    Pack years: 43.00    Types: Cigarettes  . Smokeless tobacco: Never Used  Substance and Sexual Activity   . Alcohol use: No  . Drug use: Yes    Frequency: 4.0 times per week    Types: Marijuana  . Sexual activity: Yes  Lifestyle  . Physical activity    Days per week: Patient refused    Minutes per session: Patient refused  . Stress: Patient refused  Relationships  . Social Herbalist on phone: Patient refused    Gets together: Patient refused    Attends religious service: Patient refused    Active member of club or organization: Patient refused    Attends meetings of clubs or organizations: Patient refused    Relationship status: Patient refused  Other Topics Concern  . Not on file  Social History Narrative   Independent at baseline.   Additional Social History: Patient [place] farm.  He has 4 sisters and 1 brother.  Patient is estranged from his family.  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  Patient has 1 daughter age 58 years old.  Denies alcohol use, admits to occasional marijuana use    Allergies:  No Known Allergies  Labs:  Results for orders placed or performed during the hospital encounter of 01/29/19 (from the past 48 hour(s))  CBC     Status: Abnormal   Collection Time: 02/01/19  3:46 AM  Result Value Ref Range   WBC 12.6 (H) 4.0 - 10.5 K/uL   RBC 4.59 4.22 - 5.81 MIL/uL   Hemoglobin 13.0 13.0 - 17.0 g/dL   HCT 40.2 39.0 - 52.0 %   MCV 87.6 80.0 - 100.0 fL   MCH 28.3 26.0 - 34.0 pg   MCHC 32.3 30.0 - 36.0 g/dL   RDW 13.5 11.5 - 15.5 %   Platelets 221 150 - 400 K/uL   nRBC 0.0 0.0 - 0.2 %    Comment: Performed at [facility], [address] [place], [place] [address]  Basic metabolic panel     Status: Abnormal   Collection Time: 02/01/19  3:46 AM  Result Value Ref Range   Sodium 141 135 - 145 mmol/L   Potassium 4.6 3.5 - 5.1 mmol/L   Chloride 107 98 - 111 mmol/L   CO2 29 22 - 32 mmol/L   Glucose, Bld 141 (H) 70 - 99 mg/dL   BUN 26 (H) 8 - 23 mg/dL   Creatinine, Ser 0.75 0.61 - 1.24 mg/dL    Calcium 8.5 (L) 8.9 - 10.3 mg/dL   GFR calc non Af Amer >60 >60 mL/min   GFR calc Af Amer >60 >60 mL/min   Anion gap 5 5 - 15    Comment: Performed at [facility], [address], [place], [place] [address]  CBC     Status: Abnormal   Collection Time: 02/02/19  4:31 AM  Result Value Ref Range  WBC 11.4 (H) 4.0 - 10.5 K/uL   RBC 4.57 4.22 - 5.81 MIL/uL   Hemoglobin 13.1 13.0 - 17.0 g/dL   HCT 39.7 39.0 - 52.0 %   MCV 86.9 80.0 - 100.0 fL   MCH 28.7 26.0 - 34.0 pg   MCHC 33.0 30.0 - 36.0 g/dL   RDW 13.2 11.5 - 15.5 %   Platelets 218 150 - 400 K/uL   nRBC 0.0 0.0 - 0.2 %    Comment: Performed at [facility], [address] [place], [place] [address]  Digoxin level     Status: Abnormal   Collection Time: 02/02/19  4:31 AM  Result Value Ref Range   Digoxin Level 0.5 (L) 0.8 - 2.0 ng/mL    Comment: Performed at [facility], [place], [place] [address]  Troponin I (High Sensitivity)     Status: None   Collection Time: 02/02/19  2:59 PM  Result Value Ref Range   Troponin I (High Sensitivity) 10 <18 ng/L    Comment: (NOTE) Elevated high sensitivity troponin I (hsTnI) values and significant  changes across serial measurements may suggest ACS but many other  chronic and acute conditions are known to elevate hsTnI results.  Refer to the "Links" section for chest pain algorithms and additional  guidance. Performed at [facility], [address] [place], [place] [address]     Current Facility-Administered Medications  Medication Dose Route Frequency Provider Last Rate Last Dose  . 0.9 %  sodium chloride infusion   Intravenous Continuous Geanie Kenning, PA-C 20 mL/hr at 02/01/19 7482    . acetaminophen (TYLENOL) tablet 650 mg  650 mg Oral Q6H PRN Saundra Shelling, MD       Or  . acetaminophen (TYLENOL) suppository 650 mg  650 mg Rectal Q6H PRN Pyreddy, Pavan, MD      . albuterol (PROVENTIL) (2.5 MG/3ML) 0.083% nebulizer  solution 2.5 mg  2.5 mg Nebulization Q2H PRN Mayo, Pete Pelt, MD   2.5 mg at 01/30/19 1729  . atorvastatin (LIPITOR) tablet 10 mg  10 mg Oral Daily Pyreddy, Reatha Harps, MD   10 mg at 02/02/19 1037  . azithromycin (ZITHROMAX) tablet 500 mg  500 mg Oral Daily Mayo, Pete Pelt, MD   500 mg at 02/02/19 1037  . budesonide (PULMICORT) nebulizer solution 0.25 mg  0.25 mg Nebulization BID Mayo, Pete Pelt, MD   0.25 mg at 02/02/19 0729  . diazepam (VALIUM) tablet 5 mg  5 mg Oral BID Maddilyn Campus A, MD      . digoxin (LANOXIN) tablet 0.25 mg  0.25 mg Oral Daily Pyreddy, Pavan, MD   0.25 mg at 02/02/19 1037  . diltiazem (CARDIZEM CD) 24 hr capsule 300 mg  300 mg Oral Daily Pyreddy, Pavan, MD   300 mg at 02/02/19 1038  . enoxaparin (LOVENOX) injection 40 mg  40 mg Subcutaneous Q24H Saundra Shelling, MD   40 mg at 02/01/19 2010  . feeding supplement (ENSURE ENLIVE) (ENSURE ENLIVE) liquid 237 mL  237 mL Oral TID BM Mayo, Pete Pelt, MD   237 mL at 02/02/19 1039  . furosemide (LASIX) tablet 20 mg  20 mg Oral Daily Pyreddy, Reatha Harps, MD   20 mg at 02/02/19 1037  . ipratropium-albuterol (DUONEB) 0.5-2.5 (3) MG/3ML nebulizer solution 3 mL  3 mL Nebulization Q6H Mayo, Pete Pelt, MD   3 mL at 02/02/19 1345  . labetalol (NORMODYNE) injection 10 mg  10 mg Intravenous Q6H PRN Mayo, Katy  Nicola Girt, MD   10 mg at 01/31/19 1224  . metoprolol tartrate (LOPRESSOR) tablet 25 mg  25 mg Oral BID Saundra Shelling, MD   25 mg at 02/02/19 1037  . mometasone-formoterol (DULERA) 200-5 MCG/ACT inhaler 2 puff  2 puff Inhalation BID Mayo, Pete Pelt, MD   2 puff at 02/02/19 0844  . morphine 2 MG/ML injection 2 mg  2 mg Intravenous Q3H PRN Mayo, Pete Pelt, MD   2 mg at 02/02/19 1525  . multivitamin with minerals tablet 1 tablet  1 tablet Oral Daily Mayo, Pete Pelt, MD   1 tablet at 02/02/19 1036  . nicotine (NICODERM CQ - dosed in mg/24 hours) patch 21 mg  21 mg Transdermal Daily Pyreddy, Reatha Harps, MD   21 mg at 02/02/19 1038  . ondansetron (ZOFRAN) tablet  4 mg  4 mg Oral Q6H PRN Pyreddy, Reatha Harps, MD       Or  . ondansetron (ZOFRAN) injection 4 mg  4 mg Intravenous Q6H PRN Pyreddy, Pavan, MD      . pantoprazole (PROTONIX) EC tablet 40 mg  40 mg Oral Daily Mayo, Pete Pelt, MD   40 mg at 02/02/19 1038  . predniSONE (DELTASONE) tablet 50 mg  50 mg Oral Q breakfast Mayo, Pete Pelt, MD   50 mg at 02/02/19 0843  . senna-docusate (Senokot-S) tablet 1 tablet  1 tablet Oral QHS PRN Pyreddy, Reatha Harps, MD      . sertraline (ZOLOFT) tablet 100 mg  100 mg Oral Daily Mayo, Pete Pelt, MD   100 mg at 02/02/19 1036    Musculoskeletal: Strength & Muscle Tone: within normal limits Gait & Station: normal Patient leans: Backward  Psychiatric Specialty Exam: Physical Exam  Review of Systems  Constitutional: Negative for fever.  HENT: Negative for hearing loss.   Eyes: Negative for blurred vision.  Cardiovascular: Negative for chest pain.  Skin: Negative for rash.  Psychiatric/Behavioral: Positive for depression. Negative for hallucinations, memory loss, substance abuse and suicidal ideas. The patient is nervous/anxious and has insomnia.     Blood pressure 133/66, pulse 80, temperature 97.8 F (36.6 C), resp. rate 18, height 6' (1.829 m), weight 63.5 kg, SpO2 92 %.Body mass index is 18.99 kg/m.  General Appearance: Fairly Groomed  Eye Contact:  Good  Speech:  Clear and Coherent  Volume:  Normal  Mood:  Depressed  Affect:  Depressed  Thought Process:  Coherent  Orientation:  Full (Time, Place, and Person)  Thought Content:  Logical  Suicidal Thoughts:  No  Homicidal Thoughts:  No  Memory:  Immediate;   Good  Judgement:  Good  Insight:  Good  Psychomotor Activity:  Normal  Concentration:  Concentration: Good  Recall:  Good  Fund of Knowledge:  Good  Language:  Good  Akathisia:  No  Handed:  Right  AIMS (if indicated):     Assets:  Communication Skills Desire for Improvement Housing Leisure Time Resilience Social Support  ADL's:  Intact   Cognition:  WNL  Sleep:        Treatment Plan Summary: Assessment: 63 year old male with history of depression and anxiety who presents with a COPD exacerbation.  Patient's medical status along with his other chronic medical issues and social stressors are contributing to his picture.  Furthermore patient is on prednisone which may affect his emotional processing.  Will change and slightly increase anxiolytic medication.  Writer agrees with the recent change of Zoloft, will not increase as medication was increased just 1 day ago.  Medications:  Discontinue Klonopin 0.5 mg twice daily Start Valium 5 mg twice daily Continue with Zoloft 100 mg daily  Disposition: No evidence of imminent risk to self or others at present.   Supportive therapy provided about ongoing stressors. Discussed crisis plan, support from social network, calling 911, coming to the Emergency Department, and calling Suicide Hotline.  Dixie Dials, MD 02/02/2019 6:21 PM

## 2019-02-02 NOTE — Progress Notes (Signed)
Pt on 2 L 02. Sa02 are 95%. I decreased 02 to 1L and will check Sa02 again.

## 2019-02-02 NOTE — Progress Notes (Addendum)
[place] [facility] [place] NAME: Ruben [name]    MR#:  [id number]  DATE OF BIRTH:  May 28, 1955  SUBJECTIVE:   States that his breathing is a little bit better today.  He still does not feel back to his baseline.  He is getting winded when he gets up out of bed. Cough is improving. He endorses uncontrolled depression and anxiety.  Feels like his current anxiety and depression medicines are not helping.  REVIEW OF SYSTEMS:  Review of Systems  Constitutional: Negative for chills and fever.  HENT: Negative for congestion and sore throat.   Eyes: Negative for blurred vision and double vision.  Respiratory: Positive for cough, sputum production, shortness of breath and wheezing.   Cardiovascular: Negative for chest pain, palpitations and leg swelling.  Gastrointestinal: Negative for nausea and vomiting.  Genitourinary: Negative for dysuria and urgency.  Musculoskeletal: Negative for back pain and neck pain.  Neurological: Negative for dizziness and headaches.  Psychiatric/Behavioral: Negative for depression. The patient is not nervous/anxious.    DRUG ALLERGIES:  No Known Allergies VITALS:  Blood pressure 135/67, pulse (!) 59, temperature 97.7 F (36.5 C), temperature source Oral, resp. rate 16, height 6' (1.829 m), weight 63.5 kg, SpO2 95 %. PHYSICAL EXAMINATION:  Physical Exam  GENERAL:  Laying in the bed with no acute distress. Thin-appearing. HEENT: Head atraumatic, normocephalic. Pupils equal, round, reactive to light and accommodation. No scleral icterus. Extraocular muscles intact. Oropharynx and nasopharynx clear.  NECK:  Supple, no jugular venous distention. No thyroid enlargement. LUNGS: + Scattered expiratory wheezing present.  Nasal cannula in place. Normal work of breathing.  Able to speak in full sentences. CARDIOVASCULAR: RRR, S1, S2 normal. No murmurs, rubs, or gallops.  ABDOMEN: Soft, nontender, nondistended. Bowel sounds present.   EXTREMITIES: No pedal edema, cyanosis, or clubbing.  NEUROLOGIC: CN 2-12 intact, no focal deficits. 5/5 muscle strength throughout all extremities. Sensation intact throughout. Gait not checked.  PSYCHIATRIC: The patient is alert and oriented x 3.  Occasionally tearful. SKIN: No obvious rash, lesion, or ulcer.  LABORATORY PANEL:  Male CBC Recent Labs  Lab 02/02/19 0431  WBC 11.4*  HGB 13.1  HCT 39.7  PLT 218   ------------------------------------------------------------------------------------------------------------------ Chemistries  Recent Labs  Lab 01/29/19 1156  02/01/19 0346  NA 138   < > 141  K 3.7   < > 4.6  CL 101   < > 107  CO2 27   < > 29  GLUCOSE 105*   < > 141*  BUN 10   < > 26*  CREATININE 0.65   < > 0.75  CALCIUM 8.8*   < > 8.5*  AST 15  --   --   ALT 11  --   --   ALKPHOS 69  --   --   BILITOT 0.7  --   --    < > = values in this interval not displayed.   RADIOLOGY:  No results found. ASSESSMENT AND PLAN:   Acute hypoxic respiratory failure secondary to COPD exacerbation improving.  Patient is breathing more comfortably this morning.  Remains on 2 L O2 today. -Continue prednisone -Continue Pulmicort nebs twice daily -Continue Dulera twice daily -Continue azithromycin -Duonebs scheduled -Wean O2 as able -Palliative care consult- patient will follow with outpatient palliative care on discharge -Will need to ambulate with pulse ox prior to discharge to determine if he is a candidate for supplemental oxygen at home  Severe dysphagia- due to  esophageal stricture and a large amount of meat stuck in the esophagus. Resolved. -EGD 10/21 with benign-appearing moderate stenosis in the distal esophagus with a large amount of meat stuck in the lower esophagus -Continue Protonix 40 mg daily patient needs new prescription on discharge -SLP following -Continue dysphagia diet for now  Tobacco use- smokes 1 pack/day -Nicotine patch   Hypertension- normotensive  today -Continue home metoprolol and diltiazem -IV labetalol prn  Hyperlipidemiastable -Continue home lipitor  Depression- patient endorsing some worsening anxiety and depression recently -Home Zoloft was increased to 100 mg daily yesterday -Psychiatry has been consulted  Severe protein-calorie malnutrition -Continue Ensure 3 times daily between meals  All the records are reviewed and case discussed with Care Management/Social Worker. Management plans discussed with the patient, family and they are in agreement.  CODE STATUS: Full Code  TOTAL TIME TAKING CARE OF THIS PATIENT: 38 minutes.   More than 50% of the time was spent in counseling/coordination of care: YES  POSSIBLE D/C tomorrow, DEPENDING ON CLINICAL CONDITION.  Berna Spare Mayo M.D on 02/02/2019 at 1:18 PM  Between 7am to 6pm - Pager - [phone]  After 6pm go to [email or web address] - password [email or web address] [facility]  [facility]  Office  [phone]  CC: Primary care physician; Ranae Plumber, [place]  Note: This dictation was prepared with Dragon dictation along with smaller phrase technology. Any transcriptional errors that result from this process are unintentional.

## 2019-02-02 NOTE — Progress Notes (Signed)
PT Cancellation Note  Patient Details Name: Ruben [name] MRN: [id number] DOB: July 29, 1955   Cancelled Treatment:    Reason Eval/Treat Not Completed: Patient declined, no reason specified.  Pt resting in bed upon PT arrival.  Pt declining PT d/t wanting to rest.  Will re-attempt PT treatment session at a later date/time.  Leitha Bleak, PT 02/02/19, 1:21 PM [phone]

## 2019-02-02 NOTE — TOC Progression Note (Signed)
Transition of Care [facility]) - Progression Note    Patient Details  Name: Ruben [name] MRN: [id number] Date of Birth: 1956-01-31  Transition of Care [facility]) CM/SW Contact  Shelbie Hutching, RN Phone Number: 02/02/2019, 9:02 AM  Clinical Narrative:     Outpatient palliative referral give to Flo Shanks with [facility].    Expected Discharge Plan: [place] Barriers to Discharge: Continued Medical Work up  Expected Discharge Plan and Services Expected Discharge Plan: [place] Choice: Home Health, Durable Medical Equipment(Maybe) Living arrangements for the past 2 months: Single Family Home                                       Social Determinants of Health (SDOH) Interventions    Readmission Risk Interventions No flowsheet data found.

## 2019-02-02 NOTE — Plan of Care (Signed)
  Problem: Health Behavior/Discharge Planning: Goal: Ability to manage health-related needs will improve Outcome: Progressing   Problem: Health Behavior/Discharge Planning: Goal: Ability to manage health-related needs will improve Outcome: Progressing   Problem: Clinical Measurements: Goal: Ability to maintain clinical measurements within normal limits will improve Outcome: Progressing Goal: Will remain free from infection Outcome: Progressing Goal: Diagnostic test results will improve Outcome: Progressing Goal: Respiratory complications will improve Outcome: Progressing Goal: Cardiovascular complication will be avoided Outcome: Progressing   Problem: Activity: Goal: Risk for activity intolerance will decrease Outcome: Progressing   Problem: Nutrition: Goal: Adequate nutrition will be maintained Outcome: Progressing   Problem: Coping: Goal: Level of anxiety will decrease Outcome: Progressing   Problem: Elimination: Goal: Will not experience complications related to bowel motility Outcome: Progressing Goal: Will not experience complications related to urinary retention Outcome: Progressing   Problem: Pain Managment: Goal: General experience of comfort will improve Outcome: Progressing   Problem: Safety: Goal: Ability to remain free from injury will improve Outcome: Progressing   Problem: Skin Integrity: Goal: Risk for impaired skin integrity will decrease Outcome: Progressing   Problem: Education: Goal: Knowledge of disease or condition will improve Outcome: Progressing Goal: Knowledge of the prescribed therapeutic regimen will improve Outcome: Progressing Goal: Individualized Educational Video(s) Outcome: Progressing   Problem: Activity: Goal: Ability to tolerate increased activity will improve Outcome: Progressing Goal: Will verbalize the importance of balancing activity with adequate rest periods Outcome: Progressing   Problem: Respiratory: Goal:  Ability to maintain a clear airway will improve Outcome: Progressing Goal: Levels of oxygenation will improve Outcome: Progressing Goal: Ability to maintain adequate ventilation will improve Outcome: Progressing

## 2019-02-02 NOTE — TOC Progression Note (Signed)
Transition of Care [facility]) - Progression Note    Patient Details  Name: Ruben [name] MRN: [id number] Date of Birth: 03/25/1956  Transition of Care [facility]) CM/SW [place], LCSW Phone Number: 02/02/2019, 9:15 AM  Clinical Narrative:  CSW spoke with DSS Medicaid worker. They called patient in room to get his new address and will call him once he gets home to set up Medicaid transportation. CSW gave him the phone number for [employer]. He will call when he gets home to complete assessment. This will take about 10 minutes. [employer] employee was unsure how long the wait list is because it depends on where he lives.    Expected Discharge Plan: [place] Barriers to Discharge: Continued Medical Work up  Expected Discharge Plan and Services Expected Discharge Plan: [place] Choice: Home Health, Durable Medical Equipment(Maybe) Living arrangements for the past 2 months: Single Family Home                                       Social Determinants of Health (SDOH) Interventions    Readmission Risk Interventions No flowsheet data found.

## 2019-02-03 MED ORDER — DILTIAZEM HCL ER COATED BEADS 300 MG PO CP24: 300.0000 mg | ORAL_CAPSULE | Freq: Every day | ORAL | 0 refills | Status: DC

## 2019-02-03 MED ORDER — ENSURE ENLIVE PO LIQD: 237.0000 mL | Freq: Three times a day (TID) | ORAL | 0 refills | Status: AC

## 2019-02-03 MED ORDER — PANTOPRAZOLE SODIUM 40 MG PO TBEC: 40.0000 mg | DELAYED_RELEASE_TABLET | Freq: Every day | ORAL | 0 refills | Status: DC

## 2019-02-03 MED ORDER — DIAZEPAM 5 MG PO TABS: 5.0000 mg | ORAL_TABLET | Freq: Two times a day (BID) | ORAL | 0 refills | Status: DC

## 2019-02-03 MED ORDER — AZITHROMYCIN 500 MG PO TABS: 500.0000 mg | ORAL_TABLET | Freq: Every day | ORAL | 0 refills | Status: DC

## 2019-02-03 MED ORDER — SENNOSIDES-DOCUSATE SODIUM 8.6-50 MG PO TABS: 1.0000 | ORAL_TABLET | Freq: Every evening | ORAL | 0 refills | Status: DC | PRN

## 2019-02-03 MED ORDER — ADULT MULTIVITAMIN W/MINERALS CH: 1.0000 | ORAL_TABLET | Freq: Every day | ORAL | 0 refills | Status: DC

## 2019-02-03 MED ORDER — NICOTINE 21 MG/24HR TD PT24: 21.0000 mg | MEDICATED_PATCH | Freq: Every day | TRANSDERMAL | 0 refills | Status: DC

## 2019-02-03 MED ORDER — IPRATROPIUM-ALBUTEROL 0.5-2.5 (3) MG/3ML IN SOLN: 3.0000 mL | Freq: Three times a day (TID) | RESPIRATORY_TRACT | Status: DC

## 2019-02-03 MED ORDER — PREDNISONE 10 MG PO TABS: ORAL_TABLET | ORAL | 0 refills | Status: DC

## 2019-02-03 NOTE — Discharge Summary (Signed)
[facility] NAME: Ruben [name]    MR#:  [id number]  DATE OF BIRTH:  08/08/1955  DATE OF ADMISSION:  01/29/2019   ADMITTING PHYSICIAN: Saundra Shelling, MD  DATE OF DISCHARGE: 02/03/2019  PRIMARY CARE PHYSICIAN: Ranae Plumber, PA   ADMISSION DIAGNOSIS:  COPD exacerbation ([place]) [J44.1] DISCHARGE DIAGNOSIS:  Active Problems:   COPD exacerbation (HCC)   Protein-calorie malnutrition, severe   Major depressive disorder, recurrent episode, moderate ([place])  SECONDARY DIAGNOSIS:   Past Medical History:  Diagnosis Date  . Cancer ([place])    lung cancer  . CHF (congestive heart failure) ([place])   . COPD (chronic obstructive pulmonary disease) ([place])   . Coronary artery disease   . Depression   . Hypertension   . Myocardial infarction ([place])   . Stroke [facility])    HOSPITAL COURSE:  Chief complaint; shortness of breath and chest pain   History of presenting complaint Ruben [name]  is a 63 y.o. male with a known history of COPD, chronic congestive heart failure, lung cancer, coronary disease, stroke, hypertension presented to the emergency room for shortness of breath and wheezing.  Is been going on for the last 2 days.  Patient was short of breath and wheezing and oxygen saturation was low and he was put on oxygen via nasal cannula.  No complaints of chest pain.  No fever chills and myalgias.  No recent travel.  Patient admitted to medical service for further evaluation and management.   Hospital course; Acute hypoxic respiratory failure secondary to COPD exacerbation- Treated and clinically resolved.  COPD exacerbation significantly improved.  Patient already weaned off oxygen and ambulated with nursing staff with oxygen saturation of 93% on room air this morning.  Patient feeling much better and wishes to be discharged home this morning as soon as possible.  During the course of this admission patient was treated with steroids as well  as antibiotics and nebulizer treatment.  Being discharged on p.o. prednisone taper and p.o. to biotics for a few days.  Clinically stable and back to baseline.  Patient does not require home oxygen therapy after ambulation today.   Severe dysphagia- due to esophageal stricture and a large amount of meat stuck in the esophagus. Resolved. -EGD 10/21 with benign-appearing moderate stenosis in the distal esophagus with a large amount of meat stuck in the lower esophagus -Continue Protonix 40 mg daily-prescription given on discharge. Continue dysphagia 3 diet with thin liquid consistency on discharge  Tobacco use- smokes 1 pack/day -Nicotine patch  Hypertension- normotensive today -Continue home metoprolol and diltiazem.  Outpatient monitoring by primary care physicianHyperlipidemia-stable -Continue home lipitor Depression- patient endorsing some worsening anxiety and depression recently Patient seen by psychiatrist during this admission.  Recommended continuing current dose of Zoloft which was just increased to 1 day prior to this admission.  Psychiatrist also started patient on Valium 5 mg p.o. twice daily and discontinued Klonopin.  Prescription for Valium given.  Follow-up with primary care physician and psychiatrist as outpatient. No suicidal or homicidal ideations. Severe protein-calorie malnutrition -Continue Ensure 3 times daily between meals  DISCHARGE CONDITIONS:  Patient clinically and hemodynamically stable.  Wishes to be discharged home today. CONSULTS OBTAINED:   DRUG ALLERGIES:  No Known Allergies DISCHARGE MEDICATIONS:   Allergies as of 02/03/2019   No Known Allergies     Medication List    STOP taking these medications   clonazePAM 0.5 MG tablet Commonly known as: [place]bye [place]  escitalopram 20 MG tablet Commonly known as: LEXAPRO   predniSONE 10 MG (21) Tbpk tablet Commonly known as: STERAPRED UNI-PAK 21 TAB Replaced by: predniSONE 10 MG tablet     TAKE these  medications   albuterol 108 (90 Base) MCG/ACT inhaler Commonly known as: VENTOLIN HFA Inhale 2 puffs into the lungs every 6 (six) hours as needed for wheezing or shortness of breath.   atorvastatin 10 MG tablet Commonly known as: LIPITOR Take 1 tablet (10 mg total) by mouth daily.   azithromycin 500 MG tablet Commonly known as: ZITHROMAX Take 1 tablet (500 mg total) by mouth daily. Start taking on: February 04, 2019   diazepam 5 MG tablet Commonly known as: VALIUM Take 1 tablet (5 mg total) by mouth 2 (two) times daily. Do not drive or operate machinery while on medication.   digoxin 0.25 MG tablet Commonly known as: LANOXIN Take 1 tablet (0.25 mg total) by mouth daily.   diltiazem 300 MG 24 hr capsule Commonly known as: CARDIZEM CD Take 1 capsule (300 mg total) by mouth daily.   feeding supplement (ENSURE ENLIVE) Liqd Take 237 mLs by mouth 3 (three) times daily between meals.   furosemide 20 MG tablet Commonly known as: Lasix Take 1 tablet (20 mg total) by mouth daily.   ipratropium-albuterol 0.5-2.5 (3) MG/3ML Soln Commonly known as: DUONEB Take 3 mLs by nebulization every 4 (four) hours as needed (shortness of breath).   metoprolol tartrate 25 MG tablet Commonly known as: LOPRESSOR Take 1 tablet (25 mg total) by mouth 2 (two) times daily.   mometasone-formoterol 200-5 MCG/ACT Aero Commonly known as: DULERA Inhale 2 puffs into the lungs 2 (two) times daily.   multivitamin with minerals Tabs tablet Take 1 tablet by mouth daily. Start taking on: February 04, 2019   nicotine 21 mg/24hr patch Commonly known as: NICODERM CQ - dosed in mg/24 hours Place 1 patch (21 mg total) onto the skin daily. Start taking on: February 04, 2019   pantoprazole 40 MG tablet Commonly known as: PROTONIX Take 1 tablet (40 mg total) by mouth daily. Start taking on: February 04, 2019   predniSONE 10 MG tablet Commonly known as: DELTASONE Prednisone 40 mg p.o. daily x2 days Then 30 mg  p.o. daily x2 days Then 20 mg p.o. daily x2 days Then 10 mg p.o. daily x2 days Replaces: predniSONE 10 MG (21) Tbpk tablet   senna-docusate 8.6-50 MG tablet Commonly known as: Senokot-S Take 1 tablet by mouth at bedtime as needed for mild constipation.   sertraline 100 MG tablet Commonly known as: ZOLOFT Take 100 mg by mouth daily.            Durable Medical Equipment  (From admission, onward)         Start     Ordered   01/31/19 0735  For home use only DME Cane  Once     01/31/19 0734           DISCHARGE INSTRUCTIONS:   DIET:  Cardiac diet and weight dysphagia 3 with thin liquid consistency DISCHARGE CONDITION:  Stable ACTIVITY:  Activity as tolerated OXYGEN:  Home Oxygen: No.  Oxygen Delivery: room air DISCHARGE LOCATION:  home   If you experience worsening of your admission symptoms, develop shortness of breath, life threatening emergency, suicidal or homicidal thoughts you must seek medical attention immediately by calling 911 or calling your MD immediately  if symptoms less severe.  You Must read complete instructions/literature along with all the possible  adverse reactions/side effects for all the Medicines you take and that have been prescribed to you. Take any new Medicines after you have completely understood and accpet all the possible adverse reactions/side effects.   Please note  You were cared for by a hospitalist during your hospital stay. If you have any questions about your discharge medications or the care you received while you were in the hospital after you are discharged, you can call the unit and asked to speak with the hospitalist on call if the hospitalist that took care of you is not available. Once you are discharged, your primary care physician will handle any further medical issues. Please note that NO REFILLS for any discharge medications will be authorized once you are discharged, as it is imperative that you return to your primary care  physician (or establish a relationship with a primary care physician if you do not have one) for your aftercare needs so that they can reassess your need for medications and monitor your lab values.    On the day of Discharge:  VITAL SIGNS:  Blood pressure (!) 166/64, pulse 65, temperature 97.6 F (36.4 C), temperature source Oral, resp. rate 18, height 6' (1.829 m), weight 63.5 kg, SpO2 96 %. PHYSICAL EXAMINATION:  GENERAL:  63 y.o.-year-old patient lying in the bed with no acute distress.  EYES: Pupils equal, round, reactive to light and accommodation. No scleral icterus. Extraocular muscles intact.  HEENT: Head atraumatic, normocephalic. Oropharynx and nasopharynx clear.  NECK:  Supple, no jugular venous distention. No thyroid enlargement, no tenderness.  LUNGS: Normal breath sounds bilaterally, no wheezing, rales,rhonchi or crepitation. No use of accessory muscles of respiration.  CARDIOVASCULAR: S1, S2 normal. No murmurs, rubs, or gallops.  ABDOMEN: Soft, non-tender, non-distended. Bowel sounds present. No organomegaly or mass.  EXTREMITIES: No pedal edema, cyanosis, or clubbing.  NEUROLOGIC: Cranial nerves II through XII are intact. Muscle strength 5/5 in all extremities. Sensation intact. Gait not checked.  PSYCHIATRIC: The patient is alert and oriented x 3.  SKIN: No obvious rash, lesion, or ulcer.  DATA REVIEW:   CBC Recent Labs  Lab 02/02/19 0431  WBC 11.4*  HGB 13.1  HCT 39.7  PLT 218    Chemistries  Recent Labs  Lab 01/29/19 1156  02/01/19 0346  NA 138   < > 141  K 3.7   < > 4.6  CL 101   < > 107  CO2 27   < > 29  GLUCOSE 105*   < > 141*  BUN 10   < > 26*  CREATININE 0.65   < > 0.75  CALCIUM 8.8*   < > 8.5*  AST 15  --   --   ALT 11  --   --   ALKPHOS 69  --   --   BILITOT 0.7  --   --    < > = values in this interval not displayed.     Microbiology Results  Results for orders placed or performed during the hospital encounter of 01/29/19  SARS  CORONAVIRUS 2 (TAT 6-24 HRS) Nasopharyngeal Nasopharyngeal Swab     Status: None   Collection Time: 01/29/19 12:43 PM   Specimen: Nasopharyngeal Swab  Result Value Ref Range Status   SARS Coronavirus 2 NEGATIVE NEGATIVE Final    Comment: (NOTE) SARS-CoV-2 target nucleic acids are NOT DETECTED. The SARS-CoV-2 RNA is generally detectable in upper and lower respiratory specimens during the acute phase of infection. Negative results do not preclude SARS-CoV-2  infection, do not rule out co-infections with other pathogens, and should not be used as the sole basis for treatment or other patient management decisions. Negative results must be combined with clinical observations, patient history, and epidemiological information. The expected result is Negative. Fact Sheet for Patients: SugarRoll.be Fact Sheet for Healthcare Providers: https://www.woods-mathews.com/ This test is not yet approved or cleared by the [place] FDA and  has been authorized for detection and/or diagnosis of SARS-CoV-2 by FDA under an Emergency Use Authorization (EUA). This EUA will remain  in effect (meaning this test can be used) for the duration of the COVID-19 declaration under Section 56 4(b)(1) of the Act, 21 U.S.C. section 360bbb-3(b)(1), unless the authorization is terminated or revoked sooner. Performed at [facility], [place] [address], [place], [place] [address]     RADIOLOGY:  No results found.   Management plans discussed with the patient, family and they are in agreement.  CODE STATUS: Full Code   TOTAL TIME TAKING CARE OF THIS PATIENT: 38 minutes.    Ruben [name] M.D on 02/03/2019 at 11:22 AM  Between 7am to 6pm - Pager - [phone]  After 6pm go to [email or web address] - password [email or web address] [facility]  [facility]  Office  [phone]  CC: Primary care physician; Ranae Plumber, [place]   Note: This dictation was prepared with  Dragon dictation along with smaller phrase technology. Any transcriptional errors that result from this process are unintentional.

## 2019-02-03 NOTE — Progress Notes (Signed)
Physical Therapy Treatment Patient Details Name: Ruben [name] MRN: [id number] DOB: [birth year] Today's Date: 02/03/2019    History of Present Illness [name] is a 61yoM who [facility] with progressive SOB. Covid test negative. Pt admitted with COPD exacerbation.    PT Comments    Pt in bed, stating he does not feel well but that he is going home.  Pt took O2 off to go to bathroom and sats remained 93% on room air.  Walked x 1 around unit on room air with a good steady pace and no LOB or assist needed.  Sats remained at baseline on room air.  Pt increasingly agitated during session with mobility and wanting to "cut the crap and get out of here."  Notified RN and pt dressing in street clothes upon leaving room.   Follow Up Recommendations  Home health PT     Equipment Recommendations  Cane    Recommendations for Other Services       Precautions / Restrictions Precautions Precautions: Fall Restrictions Weight Bearing Restrictions: No    Mobility  Bed Mobility Overal bed mobility: Independent                Transfers Overall transfer level: Independent                  Ambulation/Gait   Gait Distance (Feet): 160 Feet Assistive device: None     Gait velocity interpretation: 1.31 - 2.62 ft/sec, indicative of limited community ambulator General Gait Details: 1 lap with ease on room air.  Sats stable 92-93%   Stairs             Wheelchair Mobility    Modified Rankin (Stroke Patients Only)       Balance Overall balance assessment: History of Falls;Mild deficits observed, not formally tested                                          Cognition Arousal/Alertness: Awake/alert Behavior During Therapy: Agitated Overall Cognitive Status: Within Functional Limits for tasks assessed                                        Exercises      General Comments        Pertinent Vitals/Pain Pain  Assessment: No/denies pain    Home Living                      Prior Function            PT Goals (current goals can now be found in the care plan section) Progress towards PT goals: Progressing toward goals    Frequency    Min 2X/week      PT Plan Current plan remains appropriate    Co-evaluation              AM-PAC PT "6 Clicks" Mobility   Outcome Measure  Help needed turning from your back to your side while in a flat bed without using bedrails?: None Help needed moving from lying on your back to sitting on the side of a flat bed without using bedrails?: None Help needed moving to and from a bed to a chair (including a wheelchair)?: None Help needed standing up from a chair using  your arms (e.g., wheelchair or bedside chair)?: None Help needed to walk in hospital room?: None Help needed climbing 3-5 steps with a railing? : A Little 6 Click Score: 23    End of Session   Activity Tolerance: Patient tolerated treatment well;Treatment limited secondary to agitation Patient left: in bed;with call bell/phone within reach Nurse Communication: Other (comment);Mobility status       Time: 1055-1103 PT Time Calculation (min) (ACUTE ONLY): 8 min  Charges:  $Gait Training: 8-22 mins                     Chesley Noon, PTA 02/03/19, 11:23 AM

## 2019-02-03 NOTE — Progress Notes (Signed)
Pt states he will not be able to fill RX meds for at least a week or two. I advised pt that I would contact case mgmt and assist with medications. Pt is refusing to stay and said he would leave AMA. Pt upset and refusing to accept any D/C instructions at this time. He was taken out via wheelchair to family vehicle with no complications.

## 2019-02-07 ENCOUNTER — Telehealth: Payer: Self-pay | Admitting: Nurse Practitioner

## 2019-02-07 NOTE — Telephone Encounter (Signed)
Called patient back to schedule Palliative Consult, lady that answered the phone said patient wasn't at home and to call back around 4 PM.

## 2019-02-07 NOTE — Telephone Encounter (Signed)
Called patient to schedule Palliative Consult, no answer and I was unable to leave a message, no voicemail set up.  I then called wife's number and explained why I was calling and she said that patient was probably still asleep, wife wasn't at home at the time.  Told wife I would call him back later on today.

## 2019-02-07 NOTE — Telephone Encounter (Signed)
Called patient and spoke with his about Palliative services and he was in agreement with this.  I have scheduled a Telephone Consult for 02/20/19 @ 3:30 PM

## 2019-02-20 ENCOUNTER — Other Ambulatory Visit: Payer: Medicaid Other | Admitting: Nurse Practitioner

## 2019-02-20 ENCOUNTER — Other Ambulatory Visit: Payer: Self-pay

## 2019-02-20 ENCOUNTER — Encounter: Payer: Self-pay | Admitting: Nurse Practitioner

## 2019-02-20 DIAGNOSIS — Z515 Encounter for palliative care: Secondary | ICD-10-CM

## 2019-02-20 DIAGNOSIS — J449 Chronic obstructive pulmonary disease, unspecified: Secondary | ICD-10-CM

## 2019-02-20 NOTE — Progress Notes (Signed)
[facility] Consult Note Telephone: [phone]  Fax: [phone]  PATIENT NAME: Ruben [name] DOB: [birth year] MRN: [id number]  PRIMARY CARE PROVIDER:   Ranae Plumber, [place]  REFERRING PROVIDER:  Ranae Plumber, [place],  [place] [address]  RESPONSIBLE PARTY:   Self  Due to the COVID-19 crisis, this visit was done via telemedicine from my office and it was initiated and consent by this patient and or family.  I was asked by Ms. Daryel Gerald PA to see Ruben [name] for Palliative care consult for goc  Recommendations: 1. ACP; full code; will continue discussion tomorrow to confirm full code or DNR. Mr. Mallozzi rescheduled PC visit to continue discussion tomorrow, appointment scheduled  2. COPD; chronic dyspnea; continue to monitor respiratory status, may need to consider O2 at home if qualifies.   3. Palliative care encounter Palliative medicine team will continue to support patient, patient's family, and medical team. Visit consisted of counseling and education dealing with the complex and emotionally intense issues of symptom management and palliative care in the setting of serious and potentially life-threatening illness  I spent 40 minutes providing this consultation,  From 3:15pm to 3:50pm. More than 50% of the time in this consultation was spent coordinating communication.   HISTORY OF PRESENT ILLNESS:  Ruben [name] is a 63 y.o. year old male with multiple medical problems including Lung cancer,  congestive heart failure, Stroke, coronary artery disease, myocardial infarction, COPD, hypertension, depression, left wrist surgery, left elbow surgery, protein calorie malnutrition severe, tobacco abuse. Hospitalized 10 / 19 / 2020 to 10 / 24 / 2020 for shortness of breath, wheezing with work at significant for acute hypoxic respiratory failure secondary to COPD exacerbation. Severe dysphagia due to esophageal stricture in a  large amount of meat stuck in the esophagus which is resolved after EGD 10 / 21 /2020 with benign appearing moderate stenosis in the distal esophagus. Depression with worsening anxiety saw by psychiatrist during hospitalization recommended continuing current dose of Zoloft, discontinued Klonopin and started Valium. Severe protein calorie malnutrition recommended ensure 3 times a day. He was discharged home, full code. I called Ruben [name] for scheduled initial telemedicine telephonic palliative care visit. We talked about purpose for palliative care and [name] in agreement. We talked about past medical history that he had is congestive heart failure and myocardial infarction about a year ago. He had a stroke about 2 years ago. Ruben [name] endorses he was told that he had several mini-strokes although he doesn't have deficits from that. He does live independently at home by himself. He has a 11 year old daughter that lives with her mother. We talked about Life review. 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  He is now since retired. Ruben [name] endorses the COPD is what gives him the most problems. He does not wear oxygen at home and has not been tested to see if he would qualify for that. We started to talk about medical goals of care. Mr [name] interrupted the discussion then and asked if we can continue tomorrow. Follow-up palliative care appointment set for tomorrow at 3:30. Mr Ruben [name] verbalized he was very thankful but he had an urgent matter he needed to tend to.  Palliative Care was asked to help address goals of care.   CODE STATUS: Full code  PPS: 60% HOSPICE ELIGIBILITY/DIAGNOSIS: TBD  PAST MEDICAL  HISTORY:  Past Medical History:  Diagnosis Date  . Cancer ([place])    lung cancer  . CHF (congestive heart failure) ([place])   . COPD (chronic obstructive pulmonary disease) ([place])   . Coronary artery disease   .  Depression   . Hypertension   . Myocardial infarction ([place])   . Stroke [facility])     SOCIAL HX:  Social History   Tobacco Use  . Smoking status: Current Every Day Smoker    Packs/day: 1.00    Years: 43.00    Pack years: 43.00    Types: Cigarettes  . Smokeless tobacco: Never Used  Substance Use Topics  . Alcohol use: No    ALLERGIES: No Known Allergies   PERTINENT MEDICATIONS:  Outpatient Encounter Medications as of 02/20/2019  Medication Sig  . albuterol (PROVENTIL HFA;VENTOLIN HFA) 108 (90 Base) MCG/ACT inhaler Inhale 2 puffs into the lungs every 6 (six) hours as needed for wheezing or shortness of breath.  Marland Kitchen atorvastatin (LIPITOR) 10 MG tablet Take 1 tablet (10 mg total) by mouth daily.  Marland Kitchen azithromycin (ZITHROMAX) 500 MG tablet Take 1 tablet (500 mg total) by mouth daily.  . diazepam (VALIUM) 5 MG tablet Take 1 tablet (5 mg total) by mouth 2 (two) times daily. Do not drive or operate machinery while on medication.  . digoxin (LANOXIN) 0.25 MG tablet Take 1 tablet (0.25 mg total) by mouth daily.  Marland Kitchen diltiazem (CARDIZEM CD) 300 MG 24 hr capsule Take 1 capsule (300 mg total) by mouth daily.  . feeding supplement, ENSURE ENLIVE, (ENSURE ENLIVE) LIQD Take 237 mLs by mouth 3 (three) times daily between meals.  . furosemide (LASIX) 20 MG tablet Take 1 tablet (20 mg total) by mouth daily.  Marland Kitchen ipratropium-albuterol (DUONEB) 0.5-2.5 (3) MG/3ML SOLN Take 3 mLs by nebulization every 4 (four) hours as needed (shortness of breath).  . metoprolol tartrate (LOPRESSOR) 25 MG tablet Take 1 tablet (25 mg total) by mouth 2 (two) times daily.  . mometasone-formoterol (DULERA) 200-5 MCG/ACT AERO Inhale 2 puffs into the lungs 2 (two) times daily.  . Multiple Vitamin (MULTIVITAMIN WITH MINERALS) TABS tablet Take 1 tablet by mouth daily.  . nicotine (NICODERM CQ - DOSED IN MG/24 HOURS) 21 mg/24hr patch Place 1 patch (21 mg total) onto the skin daily.  . pantoprazole (PROTONIX) 40 MG tablet Take 1 tablet (40  mg total) by mouth daily.  . predniSONE (DELTASONE) 10 MG tablet Prednisone 40 mg p.o. daily x2 days Then 30 mg p.o. daily x2 days Then 20 mg p.o. daily x2 days Then 10 mg p.o. daily x2 days  . senna-docusate (SENOKOT-S) 8.6-50 MG tablet Take 1 tablet by mouth at bedtime as needed for mild constipation.  . sertraline (ZOLOFT) 100 MG tablet Take 100 mg by mouth daily.    No facility-administered encounter medications on file as of 02/20/2019.     PHYSICAL EXAM:  Deferred  Christin Z Gusler, NP

## 2019-02-21 ENCOUNTER — Encounter: Payer: Self-pay | Admitting: Nurse Practitioner

## 2019-02-21 ENCOUNTER — Other Ambulatory Visit: Payer: Medicaid Other | Admitting: Nurse Practitioner

## 2019-02-21 DIAGNOSIS — Z515 Encounter for palliative care: Secondary | ICD-10-CM

## 2019-02-21 DIAGNOSIS — J449 Chronic obstructive pulmonary disease, unspecified: Secondary | ICD-10-CM

## 2019-02-21 NOTE — Progress Notes (Signed)
[facility] Consult Note Telephone: [phone]  Fax: [phone]  PATIENT NAME: Ruben [name] DOB: Jan 18, 1956 MRN: [id number]  PRIMARY CARE PROVIDER:   Ranae Plumber, [place]  REFERRING PROVIDER:  Ranae Plumber, [place],  [place] [address]  RESPONSIBLE PARTY:   Self  Due to the COVID-19 crisis, this visit was done via telemedicine from my office and it was initiated and consent by this patient and or family.  Recommendations: 1. ACP; full code; will continue discussion tomorrow to confirm full code or DNR. Ruben [name] rescheduled PC visit to continue discussion tomorrow, appointment scheduled  2. COPD; chronic dyspnea; continue to monitor respiratory status, may need to consider O2 at home if qualifies.   3. Palliative care encounter Palliative medicine team will continue to support patient, patient's family, and medical team. Visit consisted of counseling and education dealing with the complex and emotionally intense issues of symptom management and palliative care in the setting of serious and potentially life-threatening illness  I spent 45 minutes providing this consultation,  from 3:00pm to 3:45pm. More than 50% of the time in this consultation was spent coordinating communication.   HISTORY OF PRESENT ILLNESS:  Ruben [name] is a 63 y.o. year old male with multiple medical problems including Lung cancer, congestive heart failure, Stroke, coronary artery disease, myocardial infarction, COPD, hypertension, depression, left wrist surgery, left elbow surgery, protein calorie malnutrition severe, tobacco abuse. Hospitalized 10 / 19 / 2020 to 10 / 24 / 2020 for shortness of breath, wheezing with work at significant for acute hypoxic respiratory failure secondary to COPD exacerbation. Severe dysphagia due to esophageal stricture in a large amount of meat stuck in the esophagus which is resolved after EGD 10 / 21 /2020  with benign appearing moderate stenosis in the distal esophagus. Depression with worsening anxiety saw by psychiatrist during hospitalization recommended continuing current dose of Zoloft, discontinued Klonopin and started Valium. Severe protein calorie malnutrition recommended ensure 3 times a day. He was discharged home, full code. I called Ruben [name] to continue telemedicine telephonic palliative care follow-up visit. We talked about purpose of visit and Ruben [name] was an agreement. We talked about how he is  feeling today. Ruben [name] endorses he is doing okay. We talked about past medical history in the setting of chronic disease. We revisited COPD. He does verbalize that he was told upon discharge that he would be eligible for oxygen though he has not received that. He does go to [facility] as primary provider and will follow up. Ruben [name] endorses that he is able to walk across the room but then becomes short of breath with exertion. We talked about his appetite. We talked about medical goals of care is he is a full code with aggressive interventions. We talked about MOST form, [facility] book and he was open to having that mailed to him for review. We talked about code status and he shares that he wants to be kept alive for his daughter. We talked about quality of life. We talked about role of palliative care and plan of care. Discuss that will continue discussion about medical goals with next palliative care visit in 2 weeks or sooner should he declined. Discussed at length with Ruben [name] about other resources that can be available. Ruben [name] endorses he would like for the palliative care social worker to contact him. Will do a referral. Questions answered to satisfaction. Therapeutic listing and emotional support  provided. Contact information provided.  Palliative Care was asked to help to continue to address goals of care.   CODE STATUS: DNR  PPS: 60% HOSPICE ELIGIBILITY/DIAGNOSIS:  TBD  PAST MEDICAL HISTORY:  Past Medical History:  Diagnosis Date  . Cancer ([place])    lung cancer  . CHF (congestive heart failure) ([place])   . COPD (chronic obstructive pulmonary disease) ([place])   . Coronary artery disease   . Depression   . Hypertension   . Myocardial infarction ([place])   . Stroke [facility])     SOCIAL HX:  Social History   Tobacco Use  . Smoking status: Current Every Day Smoker    Packs/day: 1.00    Years: 43.00    Pack years: 43.00    Types: Cigarettes  . Smokeless tobacco: Never Used  Substance Use Topics  . Alcohol use: No    ALLERGIES: No Known Allergies   PERTINENT MEDICATIONS:  Outpatient Encounter Medications as of 02/21/2019  Medication Sig  . albuterol (PROVENTIL HFA;VENTOLIN HFA) 108 (90 Base) MCG/ACT inhaler Inhale 2 puffs into the lungs every 6 (six) hours as needed for wheezing or shortness of breath.  Marland Kitchen atorvastatin (LIPITOR) 10 MG tablet Take 1 tablet (10 mg total) by mouth daily.  Marland Kitchen azithromycin (ZITHROMAX) 500 MG tablet Take 1 tablet (500 mg total) by mouth daily.  . diazepam (VALIUM) 5 MG tablet Take 1 tablet (5 mg total) by mouth 2 (two) times daily. Do not drive or operate machinery while on medication.  . digoxin (LANOXIN) 0.25 MG tablet Take 1 tablet (0.25 mg total) by mouth daily.  Marland Kitchen diltiazem (CARDIZEM CD) 300 MG 24 hr capsule Take 1 capsule (300 mg total) by mouth daily.  . feeding supplement, ENSURE ENLIVE, (ENSURE ENLIVE) LIQD Take 237 mLs by mouth 3 (three) times daily between meals.  . furosemide (LASIX) 20 MG tablet Take 1 tablet (20 mg total) by mouth daily.  Marland Kitchen ipratropium-albuterol (DUONEB) 0.5-2.5 (3) MG/3ML SOLN Take 3 mLs by nebulization every 4 (four) hours as needed (shortness of breath).  . metoprolol tartrate (LOPRESSOR) 25 MG tablet Take 1 tablet (25 mg total) by mouth 2 (two) times daily.  . mometasone-formoterol (DULERA) 200-5 MCG/ACT AERO Inhale 2 puffs into the lungs 2 (two) times daily.  . Multiple Vitamin (MULTIVITAMIN  WITH MINERALS) TABS tablet Take 1 tablet by mouth daily.  . nicotine (NICODERM CQ - DOSED IN MG/24 HOURS) 21 mg/24hr patch Place 1 patch (21 mg total) onto the skin daily.  . pantoprazole (PROTONIX) 40 MG tablet Take 1 tablet (40 mg total) by mouth daily.  . predniSONE (DELTASONE) 10 MG tablet Prednisone 40 mg p.o. daily x2 days Then 30 mg p.o. daily x2 days Then 20 mg p.o. daily x2 days Then 10 mg p.o. daily x2 days  . senna-docusate (SENOKOT-S) 8.6-50 MG tablet Take 1 tablet by mouth at bedtime as needed for mild constipation.  . sertraline (ZOLOFT) 100 MG tablet Take 100 mg by mouth daily.    No facility-administered encounter medications on file as of 02/21/2019.     PHYSICAL EXAM:   Deferred Laporchia Nakajima Z Allyssia Skluzacek, NP

## 2019-02-22 ENCOUNTER — Other Ambulatory Visit: Payer: Self-pay

## 2019-03-15 ENCOUNTER — Other Ambulatory Visit: Payer: Medicaid Other | Admitting: Nurse Practitioner

## 2019-03-15 ENCOUNTER — Other Ambulatory Visit: Payer: Self-pay

## 2019-03-15 ENCOUNTER — Telehealth: Payer: Self-pay | Admitting: Nurse Practitioner

## 2019-03-15 NOTE — Telephone Encounter (Signed)
I called for scheduled telemedicine telephone f/u palliative care visit, no answer. Will continue to try to reach Ruben [name]

## 2019-03-30 ENCOUNTER — Emergency Department: Payer: Medicaid Other

## 2019-03-30 ENCOUNTER — Other Ambulatory Visit: Payer: Self-pay

## 2019-03-30 ENCOUNTER — Emergency Department: Payer: Medicaid Other | Admitting: Anesthesiology

## 2019-03-30 ENCOUNTER — Observation Stay
Admission: EM | Admit: 2019-03-30 | Discharge: 2019-03-31 | Disposition: A | Discharge status: Home / Self Care | Payer: Medicaid Other | Attending: Internal Medicine | Admitting: Internal Medicine

## 2019-03-30 ENCOUNTER — Encounter
Admission: EM | Disposition: A | Discharge status: Home / Self Care | Payer: Self-pay | Source: Home / Self Care | Attending: Emergency Medicine

## 2019-03-30 ENCOUNTER — Encounter: Payer: Self-pay | Admitting: Emergency Medicine

## 2019-03-30 DIAGNOSIS — E43 Unspecified severe protein-calorie malnutrition: Secondary | ICD-10-CM | POA: Diagnosis not present

## 2019-03-30 DIAGNOSIS — F329 Major depressive disorder, single episode, unspecified: Secondary | ICD-10-CM | POA: Diagnosis present

## 2019-03-30 DIAGNOSIS — Z20828 Contact with and (suspected) exposure to other viral communicable diseases: Secondary | ICD-10-CM | POA: Diagnosis not present

## 2019-03-30 DIAGNOSIS — Z8673 Personal history of transient ischemic attack (TIA), and cerebral infarction without residual deficits: Secondary | ICD-10-CM | POA: Diagnosis not present

## 2019-03-30 DIAGNOSIS — J449 Chronic obstructive pulmonary disease, unspecified: Secondary | ICD-10-CM | POA: Diagnosis not present

## 2019-03-30 DIAGNOSIS — I110 Hypertensive heart disease with heart failure: Secondary | ICD-10-CM | POA: Insufficient documentation

## 2019-03-30 DIAGNOSIS — I252 Old myocardial infarction: Secondary | ICD-10-CM | POA: Diagnosis not present

## 2019-03-30 DIAGNOSIS — X58XXXA Exposure to other specified factors, initial encounter: Secondary | ICD-10-CM | POA: Insufficient documentation

## 2019-03-30 DIAGNOSIS — F32A Depression, unspecified: Secondary | ICD-10-CM | POA: Diagnosis present

## 2019-03-30 DIAGNOSIS — E785 Hyperlipidemia, unspecified: Secondary | ICD-10-CM | POA: Diagnosis not present

## 2019-03-30 DIAGNOSIS — F17210 Nicotine dependence, cigarettes, uncomplicated: Secondary | ICD-10-CM | POA: Insufficient documentation

## 2019-03-30 DIAGNOSIS — Z792 Long term (current) use of antibiotics: Secondary | ICD-10-CM | POA: Diagnosis not present

## 2019-03-30 DIAGNOSIS — T18108A Unspecified foreign body in esophagus causing other injury, initial encounter: Secondary | ICD-10-CM | POA: Diagnosis present

## 2019-03-30 DIAGNOSIS — K449 Diaphragmatic hernia without obstruction or gangrene: Secondary | ICD-10-CM | POA: Diagnosis not present

## 2019-03-30 DIAGNOSIS — I2510 Atherosclerotic heart disease of native coronary artery without angina pectoris: Secondary | ICD-10-CM | POA: Diagnosis not present

## 2019-03-30 DIAGNOSIS — Z7951 Long term (current) use of inhaled steroids: Secondary | ICD-10-CM | POA: Diagnosis not present

## 2019-03-30 DIAGNOSIS — T18128A Food in esophagus causing other injury, initial encounter: Principal | ICD-10-CM | POA: Insufficient documentation

## 2019-03-30 DIAGNOSIS — K219 Gastro-esophageal reflux disease without esophagitis: Secondary | ICD-10-CM | POA: Insufficient documentation

## 2019-03-30 DIAGNOSIS — Z9981 Dependence on supplemental oxygen: Secondary | ICD-10-CM | POA: Insufficient documentation

## 2019-03-30 DIAGNOSIS — R0600 Dyspnea, unspecified: Secondary | ICD-10-CM

## 2019-03-30 DIAGNOSIS — F332 Major depressive disorder, recurrent severe without psychotic features: Secondary | ICD-10-CM | POA: Insufficient documentation

## 2019-03-30 DIAGNOSIS — J42 Unspecified chronic bronchitis: Secondary | ICD-10-CM

## 2019-03-30 DIAGNOSIS — K2210 Ulcer of esophagus without bleeding: Secondary | ICD-10-CM | POA: Insufficient documentation

## 2019-03-30 DIAGNOSIS — Z85118 Personal history of other malignant neoplasm of bronchus and lung: Secondary | ICD-10-CM | POA: Insufficient documentation

## 2019-03-30 DIAGNOSIS — I639 Cerebral infarction, unspecified: Secondary | ICD-10-CM | POA: Diagnosis present

## 2019-03-30 DIAGNOSIS — I5032 Chronic diastolic (congestive) heart failure: Secondary | ICD-10-CM | POA: Diagnosis present

## 2019-03-30 DIAGNOSIS — I10 Essential (primary) hypertension: Secondary | ICD-10-CM | POA: Diagnosis present

## 2019-03-30 DIAGNOSIS — Z681 Body mass index (BMI) 19 or less, adult: Secondary | ICD-10-CM | POA: Diagnosis not present

## 2019-03-30 DIAGNOSIS — Z79899 Other long term (current) drug therapy: Secondary | ICD-10-CM | POA: Diagnosis not present

## 2019-03-30 DIAGNOSIS — Z720 Tobacco use: Secondary | ICD-10-CM | POA: Diagnosis present

## 2019-03-30 HISTORY — PX: ESOPHAGOGASTRODUODENOSCOPY (EGD) WITH PROPOFOL: SHX5813

## 2019-03-30 LAB — CBC WITH DIFFERENTIAL/PLATELET
Abs Immature Granulocytes: 0.04 10*3/uL (ref 0.00–0.07)
Basophils Absolute: 0.1 10*3/uL (ref 0.0–0.1)
Basophils Relative: 1 %
Eosinophils Absolute: 0.1 10*3/uL (ref 0.0–0.5)
Eosinophils Relative: 1 %
HCT: 44 % (ref 39.0–52.0)
Hemoglobin: 15.2 g/dL (ref 13.0–17.0)
Immature Granulocytes: 0 %
Lymphocytes Relative: 13 %
Lymphs Abs: 1.4 10*3/uL (ref 0.7–4.0)
MCH: 29.1 pg (ref 26.0–34.0)
MCHC: 34.5 g/dL (ref 30.0–36.0)
MCV: 84.1 fL (ref 80.0–100.0)
Monocytes Absolute: 0.8 10*3/uL (ref 0.1–1.0)
Monocytes Relative: 8 %
Neutro Abs: 8.3 10*3/uL — ABNORMAL HIGH (ref 1.7–7.7)
Neutrophils Relative %: 77 %
Platelets: 258 10*3/uL (ref 150–400)
RBC: 5.23 MIL/uL (ref 4.22–5.81)
RDW: 13 % (ref 11.5–15.5)
WBC: 10.8 10*3/uL — ABNORMAL HIGH (ref 4.0–10.5)
nRBC: 0 % (ref 0.0–0.2)

## 2019-03-30 LAB — BASIC METABOLIC PANEL
Anion gap: 13 (ref 5–15)
BUN: 21 mg/dL (ref 8–23)
CO2: 26 mmol/L (ref 22–32)
Calcium: 9.3 mg/dL (ref 8.9–10.3)
Chloride: 100 mmol/L (ref 98–111)
Creatinine, Ser: 1.08 mg/dL (ref 0.61–1.24)
GFR calc Af Amer: >60 mL/min (ref >60)
GFR calc non Af Amer: >60 mL/min (ref >60)
Glucose, Bld: 110 mg/dL — ABNORMAL HIGH (ref 70–99)
Potassium: 4.6 mmol/L (ref 3.5–5.1)
Sodium: 139 mmol/L (ref 135–145)

## 2019-03-30 LAB — RESPIRATORY PANEL BY RT PCR (FLU A&B, COVID)
Influenza A by PCR: NEGATIVE
Influenza B by PCR: NEGATIVE
SARS Coronavirus 2 by RT PCR: NEGATIVE

## 2019-03-30 LAB — HIV ANTIBODY (ROUTINE TESTING W REFLEX): HIV Screen 4th Generation wRfx: NONREACTIVE

## 2019-03-30 SURGERY — ESOPHAGOGASTRODUODENOSCOPY (EGD) WITH PROPOFOL
Anesthesia: General

## 2019-03-30 MED ORDER — ONDANSETRON HCL 4 MG/2ML IJ SOLN
4.0000 mg | Freq: Once | INTRAMUSCULAR | Status: AC
  Administered 2019-03-30: 02:00:00 4 mg via INTRAVENOUS
  Filled 2019-03-30: qty 2

## 2019-03-30 MED ORDER — SUCCINYLCHOLINE CHLORIDE 20 MG/ML IJ SOLN
INTRAMUSCULAR | Status: AC
  Filled 2019-03-30: qty 1

## 2019-03-30 MED ORDER — ACETAMINOPHEN 650 MG RE SUPP: 650.0000 mg | Freq: Four times a day (QID) | RECTAL | Status: DC | PRN

## 2019-03-30 MED ORDER — LIDOCAINE HCL (PF) 2 % IJ SOLN
INTRAMUSCULAR | Status: DC | PRN
  Administered 2019-03-30: 60 mg via INTRADERMAL

## 2019-03-30 MED ORDER — SUCCINYLCHOLINE CHLORIDE 20 MG/ML IJ SOLN
INTRAMUSCULAR | Status: DC | PRN
  Administered 2019-03-30: 70 mg via INTRAVENOUS

## 2019-03-30 MED ORDER — DIGOXIN 250 MCG PO TABS
0.2500 mg | ORAL_TABLET | Freq: Every day | ORAL | Status: DC
  Filled 2019-03-30: qty 1

## 2019-03-30 MED ORDER — PROPOFOL 10 MG/ML IV BOLUS
INTRAVENOUS | Status: DC | PRN
  Administered 2019-03-30: 20 mg via INTRAVENOUS
  Administered 2019-03-30: 30 mg via INTRAVENOUS
  Administered 2019-03-30: 100 mg via INTRAVENOUS

## 2019-03-30 MED ORDER — DIAZEPAM 5 MG PO TABS
5.0000 mg | ORAL_TABLET | Freq: Two times a day (BID) | ORAL | Status: DC
  Filled 2019-03-30: qty 1

## 2019-03-30 MED ORDER — SODIUM CHLORIDE 0.9 % IV SOLN: INTRAVENOUS | Status: DC

## 2019-03-30 MED ORDER — ADULT MULTIVITAMIN W/MINERALS CH
1.0000 | ORAL_TABLET | Freq: Every day | ORAL | Status: DC
  Filled 2019-03-30: qty 1

## 2019-03-30 MED ORDER — OMEPRAZOLE 40 MG PO CPDR: 40.0000 mg | DELAYED_RELEASE_CAPSULE | Freq: Two times a day (BID) | ORAL | 2 refills | Status: DC

## 2019-03-30 MED ORDER — ONDANSETRON HCL 4 MG/2ML IJ SOLN: 4.0000 mg | Freq: Three times a day (TID) | INTRAMUSCULAR | Status: DC | PRN

## 2019-03-30 MED ORDER — ROCURONIUM BROMIDE 50 MG/5ML IV SOLN
INTRAVENOUS | Status: AC
  Filled 2019-03-30: qty 1

## 2019-03-30 MED ORDER — METOPROLOL TARTRATE 25 MG PO TABS
25.0000 mg | ORAL_TABLET | Freq: Two times a day (BID) | ORAL | Status: DC
  Administered 2019-03-30: 25 mg via ORAL
  Filled 2019-03-30 (×2): qty 1

## 2019-03-30 MED ORDER — IPRATROPIUM-ALBUTEROL 0.5-2.5 (3) MG/3ML IN SOLN: 3.0000 mL | Freq: Once | RESPIRATORY_TRACT | Status: AC

## 2019-03-30 MED ORDER — SERTRALINE HCL 50 MG PO TABS
100.0000 mg | ORAL_TABLET | Freq: Every day | ORAL | Status: DC
  Filled 2019-03-30: qty 2

## 2019-03-30 MED ORDER — LIDOCAINE HCL (PF) 2 % IJ SOLN
INTRAMUSCULAR | Status: AC
  Filled 2019-03-30: qty 10

## 2019-03-30 MED ORDER — FENTANYL CITRATE (PF) 100 MCG/2ML IJ SOLN
INTRAMUSCULAR | Status: AC
  Filled 2019-03-30: qty 2

## 2019-03-30 MED ORDER — ATORVASTATIN CALCIUM 10 MG PO TABS: 10.0000 mg | ORAL_TABLET | Freq: Every day | ORAL | Status: DC

## 2019-03-30 MED ORDER — DILTIAZEM HCL ER COATED BEADS 300 MG PO CP24
300.0000 mg | ORAL_CAPSULE | Freq: Every day | ORAL | Status: DC
  Filled 2019-03-30: qty 1

## 2019-03-30 MED ORDER — GLUCAGON HCL RDNA (DIAGNOSTIC) 1 MG IJ SOLR
1.0000 mg | Freq: Once | INTRAMUSCULAR | Status: AC
  Administered 2019-03-30: 02:00:00 1 mg via INTRAVENOUS
  Filled 2019-03-30: qty 1

## 2019-03-30 MED ORDER — IPRATROPIUM-ALBUTEROL 0.5-2.5 (3) MG/3ML IN SOLN
3.0000 mL | RESPIRATORY_TRACT | Status: DC
  Administered 2019-03-30 – 2019-03-31 (×4): 3 mL via RESPIRATORY_TRACT
  Filled 2019-03-30 (×4): qty 3

## 2019-03-30 MED ORDER — ACETAMINOPHEN 325 MG PO TABS
650.0000 mg | ORAL_TABLET | Freq: Four times a day (QID) | ORAL | Status: DC | PRN
  Administered 2019-03-31: 650 mg via ORAL
  Filled 2019-03-30: qty 2

## 2019-03-30 MED ORDER — MOMETASONE FURO-FORMOTEROL FUM 200-5 MCG/ACT IN AERO
2.0000 | INHALATION_SPRAY | Freq: Two times a day (BID) | RESPIRATORY_TRACT | Status: DC
  Administered 2019-03-31: 2 via RESPIRATORY_TRACT
  Filled 2019-03-30: qty 8.8

## 2019-03-30 MED ORDER — NITROGLYCERIN 0.4 MG SL SUBL
0.4000 mg | SUBLINGUAL_TABLET | Freq: Once | SUBLINGUAL | Status: AC
  Administered 2019-03-30: 03:00:00 0.4 mg via SUBLINGUAL
  Filled 2019-03-30: qty 1

## 2019-03-30 MED ORDER — FENTANYL CITRATE (PF) 100 MCG/2ML IJ SOLN: 25.0000 ug | INTRAMUSCULAR | Status: DC | PRN

## 2019-03-30 MED ORDER — HYDRALAZINE HCL 25 MG PO TABS: 25.0000 mg | ORAL_TABLET | Freq: Three times a day (TID) | ORAL | Status: DC | PRN

## 2019-03-30 MED ORDER — PANTOPRAZOLE SODIUM 40 MG IV SOLR
40.0000 mg | Freq: Two times a day (BID) | INTRAVENOUS | Status: DC
  Filled 2019-03-30 (×2): qty 40

## 2019-03-30 MED ORDER — NICOTINE 21 MG/24HR TD PT24
21.0000 mg | MEDICATED_PATCH | Freq: Every day | TRANSDERMAL | Status: DC
  Filled 2019-03-30: qty 1

## 2019-03-30 MED ORDER — SENNOSIDES-DOCUSATE SODIUM 8.6-50 MG PO TABS: 1.0000 | ORAL_TABLET | Freq: Every evening | ORAL | Status: DC | PRN

## 2019-03-30 MED ORDER — ENOXAPARIN SODIUM 40 MG/0.4ML ~~LOC~~ SOLN
40.0000 mg | SUBCUTANEOUS | Status: DC
  Administered 2019-03-31: 40 mg via SUBCUTANEOUS
  Filled 2019-03-30: qty 0.4

## 2019-03-30 MED ORDER — ONDANSETRON HCL 4 MG/2ML IJ SOLN: 4.0000 mg | Freq: Once | INTRAMUSCULAR | Status: DC | PRN

## 2019-03-30 MED ORDER — IPRATROPIUM-ALBUTEROL 0.5-2.5 (3) MG/3ML IN SOLN
RESPIRATORY_TRACT | Status: AC
  Administered 2019-03-30: 3 mL via RESPIRATORY_TRACT
  Filled 2019-03-30: qty 3

## 2019-03-30 MED ORDER — SODIUM CHLORIDE 0.9 % IV BOLUS
500.0000 mL | Freq: Once | INTRAVENOUS | Status: AC
  Administered 2019-03-30: 500 mL via INTRAVENOUS

## 2019-03-30 MED ORDER — LORAZEPAM 2 MG/ML IJ SOLN
0.5000 mg | Freq: Once | INTRAMUSCULAR | Status: AC
  Administered 2019-03-30: 0.5 mg via INTRAVENOUS
  Filled 2019-03-30: qty 1

## 2019-03-30 MED ORDER — FENTANYL CITRATE (PF) 100 MCG/2ML IJ SOLN
INTRAMUSCULAR | Status: DC | PRN
  Administered 2019-03-30: 50 ug via INTRAVENOUS

## 2019-03-30 MED ORDER — ALBUTEROL SULFATE (2.5 MG/3ML) 0.083% IN NEBU
2.5000 mg | INHALATION_SOLUTION | RESPIRATORY_TRACT | Status: DC | PRN
  Filled 2019-03-30: qty 3

## 2019-03-30 MED ORDER — PROPOFOL 10 MG/ML IV BOLUS
INTRAVENOUS | Status: AC
  Filled 2019-03-30: qty 20

## 2019-03-30 MED ORDER — SODIUM CHLORIDE 0.9 % IV BOLUS: 1000.0000 mL | Freq: Once | INTRAVENOUS | Status: DC

## 2019-03-30 MED ORDER — ROCURONIUM BROMIDE 100 MG/10ML IV SOLN
INTRAVENOUS | Status: DC | PRN
  Administered 2019-03-30: 10 mg via INTRAVENOUS

## 2019-03-30 NOTE — ED Notes (Signed)
Report to Julie RN

## 2019-03-30 NOTE — Progress Notes (Signed)
Patient presented due to food bolus impaction.  He underwent urgent EGD, intubated and the food bolus was removed.  In the PACU, he was extubated and he was oxygenating 93% on 2 L.  87 to 88% on nasal cannula.  Preop, he was oxygenating at 95% on room air.  Anesthesia evaluated the patient in the PACU and recommends admission.  Chest x-ray reveals stable right lower lobe infiltrate.  He has history of COPD, history of lung cancer.  Admitting hospitalist has been notified  Cephas Darby, MD [address]  [place]  [place], [place] [address]  Main: [phone]  Fax: [phone] Pager: [phone]

## 2019-03-30 NOTE — ED Notes (Signed)
ED TO INPATIENT HANDOFF REPORT  ED Nurse Name and Phone #: Jacques Navy   [phone]  S Name/Age/Gender Ruben [name] 63 y.o. male Room/Bed: ED19A/ED19A  Code Status   Code Status: Prior  Home/SNF/Other Home Patient oriented to: self Is this baseline? Yes   Triage Complete: Triage complete  Chief Complaint Throat Issues  Triage Note No notes on file   Allergies No Known Allergies  Level of Care/Admitting Diagnosis ED Disposition    None      B Medical/Surgery History Past Medical History:  Diagnosis Date  . Cancer ([place])    lung cancer  . CHF (congestive heart failure) ([place])   . COPD (chronic obstructive pulmonary disease) ([place])   . Coronary artery disease   . Depression   . Hypertension   . Myocardial infarction ([place])   . Stroke [facility])    Past Surgical History:  Procedure Laterality Date  . CARDIAC CATHETERIZATION    . COLONOSCOPY    . ELBOW SURGERY Left   . ESOPHAGOGASTRODUODENOSCOPY N/A 01/31/2019   Procedure: ESOPHAGOGASTRODUODENOSCOPY (EGD);  Surgeon: Toledo, Benay Pike, MD;  Location: [facility] ENDOSCOPY;  Service: Gastroenterology;  Laterality: N/A;  . FLEXIBLE BRONCHOSCOPY Bilateral 08/27/2016   Procedure: FLEXIBLE BRONCHOSCOPY;  Surgeon: Allyne Gee, MD;  Location: [facility] ORS;  Service: Pulmonary;  Laterality: Bilateral;  . WRIST SURGERY Left      A IV Location/Drains/Wounds Patient Lines/Drains/Airways Status   Active Line/Drains/Airways    Name:   Placement date:   Placement time:   Site:   Days:   Peripheral IV 03/30/19 Left Antecubital   03/30/19    0226    Antecubital   less than 1          Intake/Output Last 24 hours No intake or output data in the 24 hours ending 03/30/19 0749  Labs/Imaging Results for orders placed or performed during the hospital encounter of 03/30/19 (from the past 48 hour(s))  CBC with Differential     Status: Abnormal   Collection Time: 03/30/19  2:21 AM  Result Value Ref Range   WBC 10.8 (H) 4.0 - 10.5 K/uL    RBC 5.23 4.22 - 5.81 MIL/uL   Hemoglobin 15.2 13.0 - 17.0 g/dL   HCT 44.0 39.0 - 52.0 %   MCV 84.1 80.0 - 100.0 fL   MCH 29.1 26.0 - 34.0 pg   MCHC 34.5 30.0 - 36.0 g/dL   RDW 13.0 11.5 - 15.5 %   Platelets 258 150 - 400 K/uL   nRBC 0.0 0.0 - 0.2 %   Neutrophils Relative % 77 %   Neutro Abs 8.3 (H) 1.7 - 7.7 K/uL   Lymphocytes Relative 13 %   Lymphs Abs 1.4 0.7 - 4.0 K/uL   Monocytes Relative 8 %   Monocytes Absolute 0.8 0.1 - 1.0 K/uL   Eosinophils Relative 1 %   Eosinophils Absolute 0.1 0.0 - 0.5 K/uL   Basophils Relative 1 %   Basophils Absolute 0.1 0.0 - 0.1 K/uL   Immature Granulocytes 0 %   Abs Immature Granulocytes 0.04 0.00 - 0.07 K/uL    Comment: Performed at [facility], [address] [place], [place] [address]  Respiratory Panel by RT PCR (Flu A&B, Covid) - Nasopharyngeal Swab     Status: None   Collection Time: 03/30/19  3:40 AM   Specimen: Nasopharyngeal Swab  Result Value Ref Range   SARS Coronavirus 2 by RT PCR NEGATIVE NEGATIVE    Comment: (NOTE) SARS-CoV-2 target nucleic  acids are NOT DETECTED. The SARS-CoV-2 RNA is generally detectable in upper respiratoy specimens during the acute phase of infection. The lowest concentration of SARS-CoV-2 viral copies this assay can detect is 131 copies/mL. A negative result does not preclude SARS-Cov-2 infection and should not be used as the sole basis for treatment or other patient management decisions. A negative result may occur with  improper specimen collection/handling, submission of specimen other than nasopharyngeal swab, presence of viral mutation(s) within the areas targeted by this assay, and inadequate number of viral copies (<131 copies/mL). A negative result must be combined with clinical observations, patient history, and epidemiological information. The expected result is Negative. Fact Sheet for Patients:  PinkCheek.be Fact Sheet for Healthcare Providers:   GravelBags.it This test is not yet ap proved or cleared by the [place] FDA and  has been authorized for detection and/or diagnosis of SARS-CoV-2 by FDA under an Emergency Use Authorization (EUA). This EUA will remain  in effect (meaning this test can be used) for the duration of the COVID-19 declaration under Section 564(b)(1) of the Act, 21 U.S.C. section 360bbb-3(b)(1), unless the authorization is terminated or revoked sooner.    Influenza A by PCR NEGATIVE NEGATIVE   Influenza B by PCR NEGATIVE NEGATIVE    Comment: (NOTE) The Xpert Xpress SARS-CoV-2/FLU/RSV assay is intended as an aid in  the diagnosis of influenza from Nasopharyngeal swab specimens and  should not be used as a sole basis for treatment. Nasal washings and  aspirates are unacceptable for Xpert Xpress SARS-CoV-2/FLU/RSV  testing. Fact Sheet for Patients: PinkCheek.be Fact Sheet for Healthcare Providers: GravelBags.it This test is not yet approved or cleared by the [place] FDA and  has been authorized for detection and/or diagnosis of SARS-CoV-2 by  FDA under an Emergency Use Authorization (EUA). This EUA will remain  in effect (meaning this test can be used) for the duration of the  Covid-19 declaration under Section 564(b)(1) of the Act, 21  U.S.C. section 360bbb-3(b)(1), unless the authorization is  terminated or revoked. Performed at [facility], [address] [place], [place] [address]   Basic metabolic panel     Status: Abnormal   Collection Time: 03/30/19  6:17 AM  Result Value Ref Range   Sodium 139 135 - 145 mmol/L   Potassium 4.6 3.5 - 5.1 mmol/L   Chloride 100 98 - 111 mmol/L   CO2 26 22 - 32 mmol/L   Glucose, Bld 110 (H) 70 - 99 mg/dL   BUN 21 8 - 23 mg/dL   Creatinine, Ser 1.08 0.61 - 1.24 mg/dL   Calcium 9.3 8.9 - 10.3 mg/dL   GFR calc non Af Amer >60 >60 mL/min   GFR calc Af Amer  >60 >60 mL/min   Anion gap 13 5 - 15    Comment: Performed at [facility], [address] [place], [place] [address]   DG Neck Soft Tissue  Result Date: 03/30/2019 CLINICAL DATA:  Food bolus. Chicken caught in throat. EXAM: NECK SOFT TISSUES - 1+ VIEW COMPARISON:  None. FINDINGS: There is no evidence of retropharyngeal soft tissue swelling or epiglottic enlargement. Patient is edentulous. The cervical airway is unremarkable. No radio-opaque foreign body identified. Soft tissue planes are non suspicious. No soft tissue air. There are carotid calcifications. IMPRESSION: Unremarkable radiographic appearance of neck soft tissues. No radiopaque foreign body. Electronically Signed   By: Keith Rake M.D.   On: 03/30/2019 02:52   DG Chest 2 View  Result Date:  03/30/2019 CLINICAL DATA:  COPD. Chicken caught in throat. EXAM: CHEST - 2 VIEW COMPARISON:  Radiograph 01/29/2019. CT 05/30/2017 FINDINGS: The lungs are hyperinflated. Chronic interstitial coarsening. Linear opacity projecting anteriorly on the lateral view is likely atelectasis/partial collapse of the right middle lobe or lingula. Heart is normal in size. Aortic atherosclerosis. No evidence of pneumomediastinum. Subsegmental atelectasis at the lung bases. No confluent airspace disease. No pulmonary edema or pleural fluid. No pneumothorax. No acute osseous abnormalities are seen. IMPRESSION: 1. Linear opacity projecting anteriorly on the lateral view is likely atelectasis/partial collapse of the right middle lobe or lingula. 2. Hyperinflation and chronic interstitial coarsening consistent with COPD. 3. Aortic Atherosclerosis (ICD10-I70.0) and Emphysema (ICD10-J43.9). Electronically Signed   By: Keith Rake M.D.   On: 03/30/2019 02:55    Pending Labs Unresulted Labs (From admission, onward)   None      Vitals/Pain Today's Vitals   03/30/19 0212 03/30/19 0532 03/30/19 0712 03/30/19 0748  BP:      Pulse:   83   Resp:   (!)  22   SpO2:   95%   Weight: 59 kg     Height: 6' (1.829 m)     PainSc:  6   5     Isolation Precautions No active isolations  Medications Medications  ondansetron (ZOFRAN) injection 4 mg (4 mg Intravenous Given 03/30/19 0226)  glucagon (human recombinant) (GLUCAGEN) injection 1 mg (1 mg Intravenous Given 03/30/19 0228)  nitroGLYCERIN (NITROSTAT) SL tablet 0.4 mg (0.4 mg Sublingual Given 03/30/19 0242)  sodium chloride 0.9 % bolus 500 mL (500 mLs Intravenous New Bag/Given 03/30/19 0253)  LORazepam (ATIVAN) injection 0.5 mg (0.5 mg Intravenous Given 03/30/19 0331)    Mobility walks Low fall risk   Focused Assessments Cardiac Assessment Handoff:    Lab Results  Component Value Date   CKTOTAL 356 (H) 06/01/2013   CKMB 2.0 06/01/2013   TROPONINI <0.03 04/25/2018   No results found for: DDIMER Does the Patient currently have chest pain? No     R Recommendations: See Admitting Provider Note  Report given to: Almyra Free RN  Additional Notes:  Pt and belongings to Endo

## 2019-03-30 NOTE — Anesthesia Preprocedure Evaluation (Signed)
Anesthesia Evaluation  Patient identified by MRN, date of birth, ID band Patient awake    Reviewed: Allergy & Precautions, H&P , NPO status , Patient's Chart, lab work & pertinent test results, reviewed documented beta blocker date and time   History of Anesthesia Complications Negative for: history of anesthetic complications  Airway Mallampati: II  TM Distance: >3 FB Neck ROM: full    Dental  (+) Dental Advidsory Given, Edentulous Upper, Edentulous Lower   Pulmonary shortness of breath, neg sleep apnea, COPD (currently on 4L O2 via [place]),  COPD inhaler and oxygen dependent, Recent URI , Current Smoker,    Pulmonary exam normal        Cardiovascular Exercise Tolerance: Good hypertension, (-) angina+ CAD, + Past MI and +CHF  (-) Cardiac Stents Normal cardiovascular exam(-) dysrhythmias (-) Valvular Problems/Murmurs     Neuro/Psych neg Seizures PSYCHIATRIC DISORDERS Depression CVA, No Residual Symptoms    GI/Hepatic Neg liver ROS, GERD  ,  Endo/Other  negative endocrine ROS  Renal/GU negative Renal ROS  negative genitourinary   Musculoskeletal   Abdominal   Peds  Hematology negative hematology ROS (+)   Anesthesia Other Findings Past Medical History: No date: Cancer ([place])     Comment:  lung cancer No date: CHF (congestive heart failure) ([facility]) No date: COPD (chronic obstructive pulmonary disease) ([facility]) No date: Coronary artery disease No date: Depression No date: Hypertension No date: Myocardial infarction ([place]) No date: Stroke ([place])   Reproductive/Obstetrics negative OB ROS                             Anesthesia Physical  Anesthesia Plan  ASA: IV and emergent  Anesthesia Plan: General   Post-op Pain Management:    Induction: Intravenous  PONV Risk Score and Plan: 1 and Ondansetron, Dexamethasone, Midazolam and Treatment may vary due to age or medical condition  Airway  Management Planned: Oral ETT  Additional Equipment:   Intra-op Plan:   Post-operative Plan: Extubation in OR  Informed Consent: I have reviewed the patients History and Physical, chart, labs and discussed the procedure including the risks, benefits and alternatives for the proposed anesthesia with the patient or authorized representative who has indicated his/her understanding and acceptance.     Dental Advisory Given  Plan Discussed with: Anesthesiologist, CRNA and Surgeon  Anesthesia Plan Comments:         Anesthesia Quick Evaluation

## 2019-03-30 NOTE — Progress Notes (Signed)
Pt having some shortness of breath with O2 sats 87,88 on room air. Dr. Rosey Bath notified. Acknowledged. Orders received.

## 2019-03-30 NOTE — Anesthesia Procedure Notes (Signed)
Procedure Name: Intubation Date/Time: 03/30/2019 8:35 AM Performed by: Johnna Acosta, CRNA Pre-anesthesia Checklist: Patient identified, Emergency Drugs available, Suction available, Patient being monitored and Timeout performed Oxygen Delivery Method: Circle system utilized Preoxygenation: Pre-oxygenation with 100% oxygen Induction Type: IV induction, Rapid sequence and Cricoid Pressure applied Laryngoscope Size: [name] and 2 Grade View: Grade I Tube type: Oral Tube size: 7.0 mm Number of attempts: 1 Airway Equipment and Method: Stylet Placement Confirmation: ETT inserted through vocal cords under direct vision,  positive ETCO2 and breath sounds checked- equal and bilateral Secured at: 21 cm Tube secured with: Tape Dental Injury: Teeth and Oropharynx as per pre-operative assessment

## 2019-03-30 NOTE — Consult Note (Signed)
Ruben Darby, MD [address]  [place]  [place], [place] [address]  Main: [phone]  Fax: [phone] Pager: [phone]   Consultation  Referring Provider:     No ref. provider found Primary Care Physician:  Ranae Plumber, [place] Primary Gastroenterologist:  Dr. Alice Reichert        Reason for Consultation:   Food impaction  Date of Admission:  03/30/2019 Date of Consultation:  03/30/2019         HPI:   Ruben [name] is a 63 y.o. male a history of COPD, CHF, history of lung cancer, coronary disease, hypertension who recently presented to ER on 01/31/2019 secondary to COPD exacerbation and GI was consulted for dysphagia.  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  Patient presented to the ER yesterday after he choked on a piece of boneless chicken that he said he was eating in a hurry. He reports he is not taking proton pump inhibitor at home.  NSAIDs: None  Antiplts/Anticoagulants/Anti thrombotics: None  GI Procedures:  EGD 01/31/2019 - Food in the lower third of the esophagus. Removal was successful. - Benign-appearing esophageal stenosis. - LA Grade B erosive esophagitis. - Normal stomach. - Normal examined duodenum.  Past Medical History:  Diagnosis Date  . Cancer ([place])    lung cancer  . CHF (congestive heart failure) ([place])   . COPD (chronic obstructive pulmonary disease) ([place])   . Coronary artery disease   . Depression   . Hypertension   . Myocardial infarction ([place])   . Stroke [facility])     Past Surgical History:  Procedure Laterality Date  . CARDIAC CATHETERIZATION    . COLONOSCOPY    . ELBOW SURGERY Left   . ESOPHAGOGASTRODUODENOSCOPY N/A 01/31/2019   Procedure: ESOPHAGOGASTRODUODENOSCOPY (EGD);  Surgeon: Toledo, Benay Pike, MD;  Location: [facility] ENDOSCOPY;  Service: Gastroenterology;  Laterality: N/A;  . FLEXIBLE BRONCHOSCOPY Bilateral 08/27/2016   Procedure: FLEXIBLE  BRONCHOSCOPY;  Surgeon: Allyne Gee, MD;  Location: [facility] ORS;  Service: Pulmonary;  Laterality: Bilateral;  . WRIST SURGERY Left     Prior to Admission medications   Medication Sig Start Date End Date Taking? Authorizing Provider  atorvastatin (LIPITOR) 10 MG tablet Take 1 tablet (10 mg total) by mouth daily. 02/20/18  Yes Epifanio Lesches, MD  digoxin (LANOXIN) 0.25 MG tablet Take 1 tablet (0.25 mg total) by mouth daily. 02/19/18  Yes Epifanio Lesches, MD  diltiazem (CARDIZEM CD) 300 MG 24 hr capsule Take 1 capsule (300 mg total) by mouth daily. 02/03/19  Yes Ojie, Jude, MD  furosemide (LASIX) 20 MG tablet Take 1 tablet (20 mg total) by mouth daily. 04/28/18 04/28/19 Yes Sudini, Alveta Heimlich, MD  metoprolol tartrate (LOPRESSOR) 25 MG tablet Take 1 tablet (25 mg total) by mouth 2 (two) times daily. 02/19/18  Yes Epifanio Lesches, MD  mometasone-formoterol (DULERA) 200-5 MCG/ACT AERO Inhale 2 puffs into the lungs 2 (two) times daily. 02/19/18  Yes Epifanio Lesches, MD  Multiple Vitamin (MULTIVITAMIN WITH MINERALS) TABS tablet Take 1 tablet by mouth daily. 02/04/19  Yes Ojie, Jude, MD  pantoprazole (PROTONIX) 40 MG tablet Take 1 tablet (40 mg total) by mouth daily. 02/04/19  Yes Ojie, Jude, MD  sertraline (ZOLOFT) 100 MG tablet Take 100 mg by mouth daily.    Yes [provider]  albuterol (PROVENTIL HFA;VENTOLIN HFA) 108 (90 Base) MCG/ACT inhaler Inhale 2 puffs into the lungs every 6 (six) hours as  needed for wheezing or shortness of breath. 02/19/18   Epifanio Lesches, MD  azithromycin (ZITHROMAX) 500 MG tablet Take 1 tablet (500 mg total) by mouth daily. 02/04/19   Stark Jock Jude, MD  diazepam (VALIUM) 5 MG tablet Take 1 tablet (5 mg total) by mouth 2 (two) times daily. Do not drive or operate machinery while on medication. 02/03/19   Ojie, Jude, MD  ipratropium-albuterol (DUONEB) 0.5-2.5 (3) MG/3ML SOLN Take 3 mLs by nebulization every 4 (four) hours as needed (shortness of  breath).    [provider]  nicotine (NICODERM CQ - DOSED IN MG/24 HOURS) 21 mg/24hr patch Place 1 patch (21 mg total) onto the skin daily. 02/04/19   Stark Jock, Jude, MD  senna-docusate (SENOKOT-S) 8.6-50 MG tablet Take 1 tablet by mouth at bedtime as needed for mild constipation. 02/03/19   Otila Back, MD    Current Facility-Administered Medications:  .  0.9 %  sodium chloride infusion, , Intravenous, Continuous, Shelly Spenser, Tally Due, MD, Last Rate: 20 mL/hr at 03/30/19 0811, New Bag at 03/30/19 0811  Family History  Problem Relation Age of Onset  . Heart disease Other   . Hypertension Other   . Diabetes Other   . CAD Mother      Social History   Tobacco Use  . Smoking status: Current Every Day Smoker    Packs/day: 1.00    Years: 43.00    Pack years: 43.00    Types: Cigarettes  . Smokeless tobacco: Never Used  Substance Use Topics  . Alcohol use: No  . Drug use: Yes    Frequency: 4.0 times per week    Types: Marijuana    Allergies as of 03/30/2019  . (No Known Allergies)    Review of Systems:    All systems reviewed and negative except where noted in HPI.   Physical Exam:  Vital signs in last 24 hours: Pulse Rate:  [62-83] 83 (12/18 0712) Resp:  [19-24] 22 (12/18 0712) BP: (151-162)/(78-84) 162/78 (12/18 0700) SpO2:  [95 %-96 %] 95 % (12/18 0712) Weight:  [59 kg] 59 kg (12/18 0212)   General:   Pleasant, cooperative in NAD, disheveled, ill groomed, appears malnourished Head:  Normocephalic and atraumatic. Eyes:   No icterus.   Conjunctiva pink. PERRLA. Ears:  Normal auditory acuity. Neck:  Supple; no masses or thyroidomegaly Lungs: Respirations even and unlabored. Lungs with bilateral wheezes Heart:  Regular rate and rhythm;  Without murmur, clicks, rubs or gallops Abdomen:  Soft, nondistended, nontender. Normal bowel sounds. No appreciable masses or hepatomegaly.  No rebound or guarding.  Rectal:  Not performed. Msk:  Symmetrical without gross  deformities.  Strength normal Extremities:  Without edema, cyanosis or clubbing. Neurologic:  Alert and oriented x3;  grossly normal neurologically. Skin:  Intact without significant lesions or rashes. Psych:  Alert and cooperative. Normal affect.  LAB RESULTS: CBC Latest Ref Rng & Units 03/30/2019 02/02/2019 02/01/2019  WBC 4.0 - 10.5 K/uL 10.8(H) 11.4(H) 12.6(H)  Hemoglobin 13.0 - 17.0 g/dL 15.2 13.1 13.0  Hematocrit 39.0 - 52.0 % 44.0 39.7 40.2  Platelets 150 - 400 K/uL 258 218 221    BMET BMP Latest Ref Rng & Units 03/30/2019 02/01/2019 01/30/2019  Glucose 70 - 99 mg/dL 110(H) 141(H) 156(H)  BUN 8 - 23 mg/dL 21 26(H) 19  Creatinine 0.61 - 1.24 mg/dL 1.08 0.75 0.70  Sodium 135 - 145 mmol/L 139 141 138  Potassium 3.5 - 5.1 mmol/L 4.6 4.6 4.7  Chloride 98 - 111 mmol/L  100 107 103  CO2 22 - 32 mmol/L _0 Calcium 8.9 - 10.3 mg/dL 9.3 8.5(L) 9.0    LFT Hepatic Function Latest Ref Rng & Units 01/29/2019 05/29/2017 06/29/2015  Total Protein 6.5 - 8.1 g/dL 7.2 7.3 6.4(L)  Albumin 3.5 - 5.0 g/dL 3.9 4.0 3.2(L)  AST 15 - 41 U/L _1 ALT 0 - 44 U/L 11 8(L) 16(L)  Alk Phosphatase 38 - 126 U/L 69 59 50  Total Bilirubin 0.3 - 1.2 mg/dL 0.7 0.7 0.5     STUDIES: DG Neck Soft Tissue  Result Date: 03/30/2019 CLINICAL DATA:  Food bolus. Chicken caught in throat. EXAM: NECK SOFT TISSUES - 1+ VIEW COMPARISON:  None. FINDINGS: There is no evidence of retropharyngeal soft tissue swelling or epiglottic enlargement. Patient is edentulous. The cervical airway is unremarkable. No radio-opaque foreign body identified. Soft tissue planes are non suspicious. No soft tissue air. There are carotid calcifications. IMPRESSION: Unremarkable radiographic appearance of neck soft tissues. No radiopaque foreign body. Electronically Signed   By: Keith Rake M.D.   On: 03/30/2019 02:52   DG Chest 2 View  Result Date: 03/30/2019 CLINICAL DATA:  COPD. Chicken caught in throat. EXAM: CHEST - 2  VIEW COMPARISON:  Radiograph 01/29/2019. CT 05/30/2017 FINDINGS: The lungs are hyperinflated. Chronic interstitial coarsening. Linear opacity projecting anteriorly on the lateral view is likely atelectasis/partial collapse of the right middle lobe or lingula. Heart is normal in size. Aortic atherosclerosis. No evidence of pneumomediastinum. Subsegmental atelectasis at the lung bases. No confluent airspace disease. No pulmonary edema or pleural fluid. No pneumothorax. No acute osseous abnormalities are seen. IMPRESSION: 1. Linear opacity projecting anteriorly on the lateral view is likely atelectasis/partial collapse of the right middle lobe or lingula. 2. Hyperinflation and chronic interstitial coarsening consistent with COPD. 3. Aortic Atherosclerosis (ICD10-I70.0) and Emphysema (ICD10-J43.9). Electronically Signed   By: Keith Rake M.D.   On: 03/30/2019 02:55      Impression / Plan:   Ruben [name] is a 63 y.o. male with chronic tobacco use, history of COPD, known esophageal stricture who presented with food impaction after eating a boneless chicken piece  SARS COVID-19 negative Proceed with urgent EGD with removal of food bolus as well as possible esophageal dilation of the stricture Long-term omeprazole 40 mg twice daily He will need close follow-up with GI and probably repeat EGD for dilation of the stricture  Thank you for involving me in the care of this patient.      LOS: 0 days   Sherri Sear, MD  03/30/2019, 8:25 AM   Note: This dictation was prepared with Dragon dictation along with smaller phrase technology. Any transcriptional errors that result from this process are unintentional.

## 2019-03-30 NOTE — Anesthesia Post-op Follow-up Note (Signed)
Anesthesia QCDR form completed.        

## 2019-03-30 NOTE — Anesthesia Postprocedure Evaluation (Signed)
Anesthesia Post Note  Patient: Ruben [name]  Procedure(s) Performed: ESOPHAGOGASTRODUODENOSCOPY (EGD) WITH PROPOFOL (N/A )  Patient location during evaluation: PACU Anesthesia Type: General Level of consciousness: awake and alert Pain management: pain level controlled Vital Signs Assessment: post-procedure vital signs reviewed and stable Respiratory status: spontaneous breathing, nonlabored ventilation and patient connected to face mask oxygen (Patient sating in the 80s on room air, requring O2 for the 90s) Cardiovascular status: blood pressure returned to baseline and stable Postop Assessment: no apparent nausea or vomiting Anesthetic complications: no Comments: Patient is not sating well on room air.  Preop sat documented in the mid to high 90s, but now sitting at 87-88 on room air.  Gave a duoneb and have done IS.  CXR appears to be stable from prior, but possible middle lobe infiltrate seen (appears stable from prior).  Since unable to to stabilize the patient's saturation will contact the proceduralist to admit for overnight obs.  Unlikely that the patient aspirated as it was an easy intubation and there was no evidence during layrngoscopy or during the procedure.     Last Vitals:  Vitals:   03/30/19 1105 03/30/19 1120  BP: (!) 159/66 138/60  Pulse: 61 60  Resp:    Temp:    SpO2: (!) 87% (!) 89%    Last Pain:  Vitals:   03/30/19 1050  PainSc: 0-No pain                 Martha Clan

## 2019-03-30 NOTE — Progress Notes (Signed)
Pt  O2 sats 93 on 2 liters [place]. When oxygen turned off pt O2 sats drop to 88, 87. Dr. Rosey Bath notifed. Stated we need to get in touch with Dr. Marius Ditch to admit for obs. Dr. Marius Ditch called and message left.

## 2019-03-30 NOTE — Transfer of Care (Signed)
Immediate Anesthesia Transfer of Care Note  Patient: Ruben [name]  Procedure(s) Performed: ESOPHAGOGASTRODUODENOSCOPY (EGD) WITH PROPOFOL (N/A )  Patient Location: PACU  Anesthesia Type:General  Level of Consciousness: drowsy  Airway & Oxygen Therapy: Patient Spontanous Breathing and Patient connected to face mask oxygen  Post-op Assessment: Report given to RN and Post -op Vital signs reviewed and stable  Post vital signs: Reviewed and stable  Last Vitals:  Vitals Value Taken Time  BP    Temp    Pulse 90 03/30/19 0922  Resp 21 03/30/19 0922  SpO2 99 % 03/30/19 0922  Vitals shown include unvalidated device data.  Last Pain:  Vitals:   03/30/19 0748  PainSc: 5          Complications: No apparent anesthesia complications

## 2019-03-30 NOTE — Progress Notes (Signed)
Pt O2 sats 88, 89 on 2 liters [place]. Pt given duoneb, Incentive spirometery used. Dr. Rosey Bath notified. Acknowledged. Orders received.

## 2019-03-30 NOTE — H&P (Signed)
History and Physical    Ruben [name] DHW:[id number] DOB: [birth year] DOA: 03/30/2019  Referring MD/NP/PA:   PCP: Ranae Plumber, [place]   Patient coming from:  The patient is coming from home.  At baseline, pt is independent for most of ADL.        Chief Complaint: food bolus impact  HPI: Ruben [name] is a 63 y.o. male with medical history significant of esophageal stricture, hypertension, hyperlipidemia, COPD, stroke, GERD, depression with anxiety, CAD, lung cancer, tobacco abuse, possible atrial fibrillation (patient is on digoxin and Cardizem), who presents with food impact.  Pt states that after he choked on a piece of boneless chicken that he was eating in a hurry. He has some throat pain. No nausea, vomiting, diarrhea or abdominal pain.  Denies chest pain, shortness of breath.  No symptoms of UTI. He underwent urgent EGD, intubated and the food bolus was removed by Dr. Marius Ditch.   ED Course: pt was found to have WBC 18.8, negative RVP for Covid 19, electrolytes renal function okay, temperature normal, oxygen saturation 86-96 percent on room air, heart rate is 60, blood pressure 138/60.  Chest x-ray showed stable right middle lobe opacity.  Patient is placed on MedSurg bed for observation.  Review of Systems:   General: no fevers, chills, no body weight gain, has fatigue HEENT: no blurry vision, hearing changes Respiratory: no dyspnea, coughing, wheezing CV: no chest pain, no palpitations GI: no nausea, vomiting, abdominal pain, diarrhea, constipation. food bolus impact GU: no dysuria, burning on urination, increased urinary frequency, hematuria  Ext: no leg edema Neuro: no unilateral weakness, numbness, or tingling, no vision change or hearing loss Skin: no rash, no skin tear. MSK: No muscle spasm, no deformity, no limitation of range of movement in spin Heme: No easy bruising.  Travel history: No recent long distant travel.  Allergy: No Known Allergies  Past Medical History:   Diagnosis Date  . Cancer ([place])    lung cancer  . CHF (congestive heart failure) ([place])   . COPD (chronic obstructive pulmonary disease) ([place])   . Coronary artery disease   . Depression   . Hypertension   . Myocardial infarction ([place])   . Stroke [facility])     Past Surgical History:  Procedure Laterality Date  . CARDIAC CATHETERIZATION    . COLONOSCOPY    . ELBOW SURGERY Left   . ESOPHAGOGASTRODUODENOSCOPY N/A 01/31/2019   Procedure: ESOPHAGOGASTRODUODENOSCOPY (EGD);  Surgeon: Toledo, Benay Pike, MD;  Location: [facility] ENDOSCOPY;  Service: Gastroenterology;  Laterality: N/A;  . ESOPHAGOGASTRODUODENOSCOPY (EGD) WITH PROPOFOL N/A 03/30/2019   Procedure: ESOPHAGOGASTRODUODENOSCOPY (EGD) WITH PROPOFOL;  Surgeon: Lin Landsman, MD;  Location: [facility] ENDOSCOPY;  Service: Gastroenterology;  Laterality: N/A;  . FLEXIBLE BRONCHOSCOPY Bilateral 08/27/2016   Procedure: FLEXIBLE BRONCHOSCOPY;  Surgeon: Allyne Gee, MD;  Location: [facility] ORS;  Service: Pulmonary;  Laterality: Bilateral;  . WRIST SURGERY Left     Social History:  reports that he has been smoking cigarettes. He has a 43.00 pack-year smoking history. He has never used smokeless tobacco. He reports current drug use. Frequency: 4.00 times per week. Drug: Marijuana. He reports that he does not drink alcohol.  Family History:  Family History  Problem Relation Age of Onset  . Heart disease Other   . Hypertension Other   . Diabetes Other   . CAD Mother      Prior to Admission medications   Medication Sig Start Date End Date Taking? Authorizing Provider  atorvastatin (LIPITOR) 10  MG tablet Take 1 tablet (10 mg total) by mouth daily. 02/20/18  Yes Epifanio Lesches, MD  digoxin (LANOXIN) 0.25 MG tablet Take 1 tablet (0.25 mg total) by mouth daily. 02/19/18  Yes Epifanio Lesches, MD  diltiazem (CARDIZEM CD) 300 MG 24 hr capsule Take 1 capsule (300 mg total) by mouth daily. 02/03/19  Yes Ojie, Jude, MD  furosemide (LASIX) 20 MG  tablet Take 1 tablet (20 mg total) by mouth daily. 04/28/18 04/28/19 Yes Sudini, Alveta Heimlich, MD  metoprolol tartrate (LOPRESSOR) 25 MG tablet Take 1 tablet (25 mg total) by mouth 2 (two) times daily. 02/19/18  Yes Epifanio Lesches, MD  mometasone-formoterol (DULERA) 200-5 MCG/ACT AERO Inhale 2 puffs into the lungs 2 (two) times daily. 02/19/18  Yes Epifanio Lesches, MD  Multiple Vitamin (MULTIVITAMIN WITH MINERALS) TABS tablet Take 1 tablet by mouth daily. 02/04/19  Yes Ojie, Jude, MD  pantoprazole (PROTONIX) 40 MG tablet Take 1 tablet (40 mg total) by mouth daily. 02/04/19  Yes Ojie, Jude, MD  sertraline (ZOLOFT) 100 MG tablet Take 100 mg by mouth daily.    Yes [provider]  albuterol (PROVENTIL HFA;VENTOLIN HFA) 108 (90 Base) MCG/ACT inhaler Inhale 2 puffs into the lungs every 6 (six) hours as needed for wheezing or shortness of breath. 02/19/18   Epifanio Lesches, MD  azithromycin (ZITHROMAX) 500 MG tablet Take 1 tablet (500 mg total) by mouth daily. 02/04/19   Stark Jock Jude, MD  diazepam (VALIUM) 5 MG tablet Take 1 tablet (5 mg total) by mouth 2 (two) times daily. Do not drive or operate machinery while on medication. 02/03/19   Ojie, Jude, MD  ipratropium-albuterol (DUONEB) 0.5-2.5 (3) MG/3ML SOLN Take 3 mLs by nebulization every 4 (four) hours as needed (shortness of breath).    [provider]  nicotine (NICODERM CQ - DOSED IN MG/24 HOURS) 21 mg/24hr patch Place 1 patch (21 mg total) onto the skin daily. 02/04/19   Stark Jock Jude, MD  omeprazole (PRILOSEC) 40 MG capsule Take 1 capsule (40 mg total) by mouth 2 (two) times daily before a meal. 03/30/19 04/29/19  Vanga, Tally Due, MD  senna-docusate (SENOKOT-S) 8.6-50 MG tablet Take 1 tablet by mouth at bedtime as needed for mild constipation. 02/03/19   Otila Back, MD    Physical Exam: Vitals:   03/30/19 1520 03/30/19 1614 03/30/19 1645 03/30/19 1719  BP: (!) 145/65 (!) 144/81 (!) 151/85 (!) 148/78  Pulse: (!) 58 79 67  73  Resp:  _0 Temp:  98.9 F (37.2 C) 98.3 F (36.8 C) 98.8 F (37.1 C)  TempSrc:   Oral Oral  SpO2: 91% 91% 95% 96%  Weight:      Height:       General: Not in acute distress HEENT:       Eyes: PERRL, EOMI, no scleral icterus.       ENT: No discharge from the ears and nose, no pharynx injection, no tonsillar enlargement.        Neck: No JVD, no bruit, no mass felt. Heme: No neck lymph node enlargement. Cardiac: S1/S2, RRR, No murmurs, No gallops or rubs. Respiratory: No rales, wheezing, rhonchi or rubs. GI: Soft, nondistended, nontender, no rebound pain, no organomegaly, BS present. GU: No hematuria Ext: No pitting leg edema bilaterally. 2+DP/PT pulse bilaterally. Musculoskeletal: No joint deformities, No joint redness or warmth, no limitation of ROM in spin. Skin: No rashes.  Neuro: Alert, oriented X3, cranial nerves II-XII grossly intact, moves all extremities normally.  Psych: Patient  is not psychotic, no suicidal or hemocidal ideation.  Labs on Admission: I have personally reviewed following labs and imaging studies  CBC: Recent Labs  Lab 03/30/19 0221  WBC 10.8*  NEUTROABS 8.3*  HGB 15.2  HCT 44.0  MCV 84.1  PLT 188   Basic Metabolic Panel: Recent Labs  Lab 03/30/19 0617  NA 139  K 4.6  CL 100  CO2 26  GLUCOSE 110*  BUN 21  CREATININE 1.08  CALCIUM 9.3   GFR: Estimated Creatinine Clearance: 58.4 mL/min (by C-G formula based on SCr of 1.08 mg/dL). Liver Function Tests: No results for input(s): AST, ALT, ALKPHOS, BILITOT, PROT, ALBUMIN in the last 168 hours. No results for input(s): LIPASE, AMYLASE in the last 168 hours. No results for input(s): AMMONIA in the last 168 hours. Coagulation Profile: No results for input(s): INR, PROTIME in the last 168 hours. Cardiac Enzymes: No results for input(s): CKTOTAL, CKMB, CKMBINDEX, TROPONINI in the last 168 hours. BNP (last 3 results) No results for input(s): PROBNP in the last 8760 hours. HbA1C: No  results for input(s): HGBA1C in the last 72 hours. CBG: No results for input(s): GLUCAP in the last 168 hours. Lipid Profile: No results for input(s): CHOL, HDL, LDLCALC, TRIG, CHOLHDL, LDLDIRECT in the last 72 hours. Thyroid Function Tests: No results for input(s): TSH, T4TOTAL, FREET4, T3FREE, THYROIDAB in the last 72 hours. Anemia Panel: No results for input(s): VITAMINB12, FOLATE, FERRITIN, TIBC, IRON, RETICCTPCT in the last 72 hours. Urine analysis:    Component Value Date/Time   COLORURINE Yellow 06/01/2013 1013   APPEARANCEUR Clear 06/01/2013 1013   LABSPEC 1.017 06/01/2013 1013   PHURINE 5.0 06/01/2013 1013   GLUCOSEU Negative 06/01/2013 1013   HGBUR Negative 06/01/2013 1013   BILIRUBINUR Negative 06/01/2013 1013   KETONESUR Negative 06/01/2013 1013   PROTEINUR 100 mg/dL 06/01/2013 1013   NITRITE Negative 06/01/2013 1013   LEUKOCYTESUR Negative 06/01/2013 1013   Sepsis Labs: _0 (procalcitonin:4,lacticidven:4) ) Recent Results (from the past 240 hour(s))  Respiratory Panel by RT PCR (Flu A&B, Covid) - Nasopharyngeal Swab     Status: None   Collection Time: 03/30/19  3:40 AM   Specimen: Nasopharyngeal Swab  Result Value Ref Range Status   SARS Coronavirus 2 by RT PCR NEGATIVE NEGATIVE Final    Comment: (NOTE) SARS-CoV-2 target nucleic acids are NOT DETECTED. The SARS-CoV-2 RNA is generally detectable in upper respiratoy specimens during the acute phase of infection. The lowest concentration of SARS-CoV-2 viral copies this assay can detect is 131 copies/mL. A negative result does not preclude SARS-Cov-2 infection and should not be used as the sole basis for treatment or other patient management decisions. A negative result may occur with  improper specimen collection/handling, submission of specimen other than nasopharyngeal swab, presence of viral mutation(s) within the areas targeted by this assay, and inadequate number of viral copies (<131 copies/mL). A  negative result must be combined with clinical observations, patient history, and epidemiological information. The expected result is Negative. Fact Sheet for Patients:  PinkCheek.be Fact Sheet for Healthcare Providers:  GravelBags.it This test is not yet ap proved or cleared by the [place] FDA and  has been authorized for detection and/or diagnosis of SARS-CoV-2 by FDA under an Emergency Use Authorization (EUA). This EUA will remain  in effect (meaning this test can be used) for the duration of the COVID-19 declaration under Section 564(b)(1) of the Act, 21 U.S.C. section 360bbb-3(b)(1), unless the authorization is terminated or revoked sooner.    Influenza  A by PCR NEGATIVE NEGATIVE Final   Influenza B by PCR NEGATIVE NEGATIVE Final    Comment: (NOTE) The Xpert Xpress SARS-CoV-2/FLU/RSV assay is intended as an aid in  the diagnosis of influenza from Nasopharyngeal swab specimens and  should not be used as a sole basis for treatment. Nasal washings and  aspirates are unacceptable for Xpert Xpress SARS-CoV-2/FLU/RSV  testing. Fact Sheet for Patients: PinkCheek.be Fact Sheet for Healthcare Providers: GravelBags.it This test is not yet approved or cleared by the [place] FDA and  has been authorized for detection and/or diagnosis of SARS-CoV-2 by  FDA under an Emergency Use Authorization (EUA). This EUA will remain  in effect (meaning this test can be used) for the duration of the  Covid-19 declaration under Section 564(b)(1) of the Act, 21  U.S.C. section 360bbb-3(b)(1), unless the authorization is  terminated or revoked. Performed at [facility], [address] [place], [place] [address]      Radiological Exams on Admission: DG Neck Soft Tissue  Result Date: 03/30/2019 CLINICAL DATA:  Food bolus. Chicken caught in throat. EXAM: NECK SOFT  TISSUES - 1+ VIEW COMPARISON:  None. FINDINGS: There is no evidence of retropharyngeal soft tissue swelling or epiglottic enlargement. Patient is edentulous. The cervical airway is unremarkable. No radio-opaque foreign body identified. Soft tissue planes are non suspicious. No soft tissue air. There are carotid calcifications. IMPRESSION: Unremarkable radiographic appearance of neck soft tissues. No radiopaque foreign body. Electronically Signed   By: Keith Rake M.D.   On: 03/30/2019 02:52   DG Chest 2 View  Result Date: 03/30/2019 CLINICAL DATA:  COPD. Chicken caught in throat. EXAM: CHEST - 2 VIEW COMPARISON:  Radiograph 01/29/2019. CT 05/30/2017 FINDINGS: The lungs are hyperinflated. Chronic interstitial coarsening. Linear opacity projecting anteriorly on the lateral view is likely atelectasis/partial collapse of the right middle lobe or lingula. Heart is normal in size. Aortic atherosclerosis. No evidence of pneumomediastinum. Subsegmental atelectasis at the lung bases. No confluent airspace disease. No pulmonary edema or pleural fluid. No pneumothorax. No acute osseous abnormalities are seen. IMPRESSION: 1. Linear opacity projecting anteriorly on the lateral view is likely atelectasis/partial collapse of the right middle lobe or lingula. 2. Hyperinflation and chronic interstitial coarsening consistent with COPD. 3. Aortic Atherosclerosis (ICD10-I70.0) and Emphysema (ICD10-J43.9). Electronically Signed   By: Keith Rake M.D.   On: 03/30/2019 02:55   DG Chest Port 1 View  Result Date: 03/30/2019 CLINICAL DATA:  Dyspnea. EXAM: PORTABLE CHEST 1 VIEW COMPARISON:  Same day. FINDINGS: The heart size and mediastinal contours are within normal limits. Atherosclerosis of thoracic aorta is noted. No pneumothorax or pleural effusion is noted. Stable right middle lobe opacity is noted which may represent atelectasis or possibly infiltrate. Minimal left basilar subsegmental atelectasis is noted. The  visualized skeletal structures are unremarkable. IMPRESSION: Aortic atherosclerosis. Stable right middle lobe opacity is noted which may represent atelectasis or possibly infiltrate. Electronically Signed   By: Marijo Conception M.D.   On: 03/30/2019 11:15     EKG:  Not done in ED  Assessment/Plan Principal Problem:   Foreign body in esophagus Active Problems:   Depression   COPD (chronic obstructive pulmonary disease) ([facility])   HTN (hypertension)   HLD (hyperlipidemia)   Stroke ([facility])   GERD (gastroesophageal reflux disease)   CAD (coronary artery disease)   Chronic diastolic CHF (congestive heart failure) ([place])   Tobacco abuse   Foreign body in esophagus: He underwent urgent EGD, intubated and the food bolus was  removed by Dr. Marius Ditch. -place on med-surg bed for obs -IV protonix -->pt needs to be on protonix bid at discharge  HTN:  -Continue home medications: Metoprolol, Cardizem -oral hydralazine prn  COPD (chronic obstructive pulmonary disease) ([place]): stable -Continue bronchodilators  Depression: no SI or HI -Continue home medications  HLD (hyperlipidemia): -lipitor  Hx of stroke ([place]): -lipitor  GERD (gastroesophageal reflux disease): -on PPi  CAD (coronary artery disease): No CP -on lipitor  Chronic diastolic CHF (congestive heart failure) ([place]): 2D echo on 08/31/2016 showed EF of 65-70% with grade 1 diastolic dysfunction.  Patient does not have leg edema or JVD.  No shortness of breath.  CHF is compensated. -Hold Lasix since patient does not eat well.  Tobacco abuse: -nicotine patch  Possible A fib: Patient states that he has had some type of cardiac arrhythmia -pt is on digoxin, Cardizem, metoprolol   DVT ppx: SQ Lovenox Code Status: Full code Family Communication: None at bed side.  Disposition Plan:  Anticipate discharge back to previous home environment Consults called:  Dr. Marius Ditch Admission status: Med-surg for obs  Date of Service 03/30/2019     [place]   If 7PM-7AM, please contact night-coverage [email or web address] Password [facility] 03/30/2019, 5:47 PM

## 2019-03-30 NOTE — ED Provider Notes (Signed)
[facility] Emergency Department Provider Note   ____________________________________________   First MD Initiated Contact with Patient 03/30/19 0210     (approximate)  I have reviewed the triage vital signs and the nursing notes.   HISTORY  Chief Complaint Esophageal food bolus   HPI TORRELL KRUTZ is a 63 y.o. male brought to the ED via EMS from home with a chief complaint of esophageal food bolus.  Patient has a history of COPD, CHF, CAD who reports he ate chicken approximately 7:30 PM.  Feels like it is stuck in his esophagus.  Patient had an admission on 10/19 for COPD.  During that admission he had a EGD under general anesthesia on 10/21 for dysphagia secondary to food bolus and stenosis.  States he feels similarly.  Has been trying to drink water to make himself throw up but only spits up the water.  Denies fever, chest pain, acute or worsening shortness of breath from baseline, abdominal pain.     Past Medical History:  Diagnosis Date  . Cancer ([place])    lung cancer  . CHF (congestive heart failure) ([place])   . COPD (chronic obstructive pulmonary disease) ([place])   . Coronary artery disease   . Depression   . Hypertension   . Myocardial infarction ([place])   . Stroke [facility])     Patient Active Problem List   Diagnosis Date Noted  . Protein-calorie malnutrition, severe 02/02/2019  . Major depressive disorder, recurrent episode, moderate ([place])   . COPD (chronic obstructive pulmonary disease) ([place]) 05/29/2017  . Pneumonia 06/28/2015  . COPD with exacerbation ([place]) 09/25/2014  . Depression 09/25/2014  . COPD exacerbation ([place]) 09/25/2014  . Severe recurrent major depression without psychotic features ([place]) 09/25/2014    Past Surgical History:  Procedure Laterality Date  . CARDIAC CATHETERIZATION    . COLONOSCOPY    . ELBOW SURGERY Left   . ESOPHAGOGASTRODUODENOSCOPY N/A 01/31/2019   Procedure: ESOPHAGOGASTRODUODENOSCOPY (EGD);  Surgeon: [place],  Benay Pike, MD;  Location: [facility] ENDOSCOPY;  Service: Gastroenterology;  Laterality: N/A;  . FLEXIBLE BRONCHOSCOPY Bilateral 08/27/2016   Procedure: FLEXIBLE BRONCHOSCOPY;  Surgeon: Allyne Gee, MD;  Location: [facility] ORS;  Service: Pulmonary;  Laterality: Bilateral;  . WRIST SURGERY Left     Prior to Admission medications   Medication Sig Start Date End Date Taking? Authorizing Provider  albuterol (PROVENTIL HFA;VENTOLIN HFA) 108 (90 Base) MCG/ACT inhaler Inhale 2 puffs into the lungs every 6 (six) hours as needed for wheezing or shortness of breath. 02/19/18   Epifanio Lesches, MD  atorvastatin (LIPITOR) 10 MG tablet Take 1 tablet (10 mg total) by mouth daily. 02/20/18   Epifanio Lesches, MD  azithromycin (ZITHROMAX) 500 MG tablet Take 1 tablet (500 mg total) by mouth daily. 02/04/19   Stark Jock Jude, MD  diazepam (VALIUM) 5 MG tablet Take 1 tablet (5 mg total) by mouth 2 (two) times daily. Do not drive or operate machinery while on medication. 02/03/19   Stark Jock Jude, MD  digoxin (LANOXIN) 0.25 MG tablet Take 1 tablet (0.25 mg total) by mouth daily. 02/19/18   Epifanio Lesches, MD  diltiazem (CARDIZEM CD) 300 MG 24 hr capsule Take 1 capsule (300 mg total) by mouth daily. 02/03/19   Stark Jock Jude, MD  furosemide (LASIX) 20 MG tablet Take 1 tablet (20 mg total) by mouth daily. 04/28/18 04/28/19  Hillary Bow, MD  ipratropium-albuterol (DUONEB) 0.5-2.5 (3) MG/3ML SOLN Take 3 mLs by nebulization every 4 (four) hours as needed (shortness of breath).  [provider]  metoprolol tartrate (LOPRESSOR) 25 MG tablet Take 1 tablet (25 mg total) by mouth 2 (two) times daily. 02/19/18   Epifanio Lesches, MD  mometasone-formoterol (DULERA) 200-5 MCG/ACT AERO Inhale 2 puffs into the lungs 2 (two) times daily. 02/19/18   Epifanio Lesches, MD  Multiple Vitamin (MULTIVITAMIN WITH MINERALS) TABS tablet Take 1 tablet by mouth daily. 02/04/19   Stark Jock, Jude, MD  nicotine (NICODERM CQ - DOSED IN  MG/24 HOURS) 21 mg/24hr patch Place 1 patch (21 mg total) onto the skin daily. 02/04/19   Stark Jock Jude, MD  pantoprazole (PROTONIX) 40 MG tablet Take 1 tablet (40 mg total) by mouth daily. 02/04/19   Stark Jock Jude, MD  predniSONE (DELTASONE) 10 MG tablet Prednisone 40 mg p.o. daily x2 days Then 30 mg p.o. daily x2 days Then 20 mg p.o. daily x2 days Then 10 mg p.o. daily x2 days 02/03/19   Otila Back, MD  senna-docusate (SENOKOT-S) 8.6-50 MG tablet Take 1 tablet by mouth at bedtime as needed for mild constipation. 02/03/19   Stark Jock Jude, MD  sertraline (ZOLOFT) 100 MG tablet Take 100 mg by mouth daily.     [provider]    Allergies Patient has no known allergies.  Family History  Problem Relation Age of Onset  . Heart disease Other   . Hypertension Other   . Diabetes Other   . CAD Mother     Social History Social History   Tobacco Use  . Smoking status: Current Every Day Smoker    Packs/day: 1.00    Years: 43.00    Pack years: 43.00    Types: Cigarettes  . Smokeless tobacco: Never Used  Substance Use Topics  . Alcohol use: No  . Drug use: Yes    Frequency: 4.0 times per week    Types: Marijuana    Review of Systems  Constitutional: No fever/chills Eyes: No visual changes. ENT: No sore throat. Cardiovascular: Denies chest pain. Respiratory: Denies shortness of breath. Gastrointestinal: Positive for esophageal food bolus.  No abdominal pain.  No nausea, no vomiting.  No diarrhea.  No constipation. Genitourinary: Negative for dysuria. Musculoskeletal: Negative for back pain. Skin: Negative for rash. Neurological: Negative for headaches, focal weakness or numbness.   ____________________________________________   PHYSICAL EXAM:  VITAL SIGNS: ED Triage Vitals  Enc Vitals Group     BP      Pulse      Resp      Temp      Temp src      SpO2      Weight      Height      Head Circumference      Peak Flow      Pain Score      Pain Loc      Pain Edu?       Excl. in [place]?     Constitutional: Alert and oriented. Cachetic appearing and in mild acute distress. Eyes: Conjunctivae are normal. PERRL. EOMI. Head: Atraumatic. Nose: No congestion/rhinnorhea. Mouth/Throat: Mucous membranes are moist.  Actively spitting up. Neck: No stridor.   Cardiovascular: Normal rate, regular rhythm. Grossly normal heart sounds.  Good peripheral circulation. Respiratory: Normal respiratory effort.  No retractions. Lungs with scattered wheezing. Gastrointestinal: Soft and nontender. No distention. No abdominal bruits. No CVA tenderness. Musculoskeletal: No lower extremity tenderness nor edema.  No joint effusions. Neurologic:  Normal speech and language. No gross focal neurologic deficits are appreciated.  Skin:  Skin is warm, dry  and intact. No rash noted. Psychiatric: Mood and affect are normal. Speech and behavior are normal.  ____________________________________________   LABS (all labs ordered are listed, but only abnormal results are displayed)  Labs Reviewed  CBC WITH DIFFERENTIAL/PLATELET - Abnormal; Notable for the following components:      Result Value   WBC 10.8 (*)    Neutro Abs 8.3 (*)    All other components within normal limits  BASIC METABOLIC PANEL - Abnormal; Notable for the following components:   Glucose, Bld 110 (*)    All other components within normal limits  RESPIRATORY PANEL BY RT PCR (FLU A&B, COVID)   ____________________________________________  EKG  ED ECG REPORT I, Amarri Satterly J, the attending physician, personally viewed and interpreted this ECG.   Date: 03/30/2019  EKG Time: 0242  Rate: 56  Rhythm: normal EKG, normal sinus rhythm  Axis: Normal  Intervals:none  ST&T Change: Nonspecific  ____________________________________________  RADIOLOGY  ED MD interpretation: Unremarkable soft tissue neck and chest x-rays  Official radiology report(s): DG Neck Soft Tissue  Result Date: 03/30/2019 CLINICAL DATA:   Food bolus. Chicken caught in throat. EXAM: NECK SOFT TISSUES - 1+ VIEW COMPARISON:  None. FINDINGS: There is no evidence of retropharyngeal soft tissue swelling or epiglottic enlargement. Patient is edentulous. The cervical airway is unremarkable. No radio-opaque foreign body identified. Soft tissue planes are non suspicious. No soft tissue air. There are carotid calcifications. IMPRESSION: Unremarkable radiographic appearance of neck soft tissues. No radiopaque foreign body. Electronically Signed   By: Keith Rake M.D.   On: 03/30/2019 02:52   DG Chest 2 View  Result Date: 03/30/2019 CLINICAL DATA:  COPD. Chicken caught in throat. EXAM: CHEST - 2 VIEW COMPARISON:  Radiograph 01/29/2019. CT 05/30/2017 FINDINGS: The lungs are hyperinflated. Chronic interstitial coarsening. Linear opacity projecting anteriorly on the lateral view is likely atelectasis/partial collapse of the right middle lobe or lingula. Heart is normal in size. Aortic atherosclerosis. No evidence of pneumomediastinum. Subsegmental atelectasis at the lung bases. No confluent airspace disease. No pulmonary edema or pleural fluid. No pneumothorax. No acute osseous abnormalities are seen. IMPRESSION: 1. Linear opacity projecting anteriorly on the lateral view is likely atelectasis/partial collapse of the right middle lobe or lingula. 2. Hyperinflation and chronic interstitial coarsening consistent with COPD. 3. Aortic Atherosclerosis (ICD10-I70.0) and Emphysema (ICD10-J43.9). Electronically Signed   By: Keith Rake M.D.   On: 03/30/2019 02:55    ____________________________________________   PROCEDURES  Procedure(s) performed (including Critical Care):  Procedures   ____________________________________________   INITIAL IMPRESSION / ASSESSMENT AND PLAN / ED COURSE  As part of my medical decision making, I reviewed the following data within the [place] notes reviewed and incorporated, Labs  reviewed, EKG interpreted, Old chart reviewed, Radiograph reviewed and Notes from prior ED visits     EULA MAZZOLA was evaluated in Emergency Department on 03/30/2019 for the symptoms described in the history of present illness. He was evaluated in the context of the global COVID-19 pandemic, which necessitated consideration that the patient might be at risk for infection with the SARS-CoV-2 virus that causes COVID-19. Institutional protocols and algorithms that pertain to the evaluation of patients at risk for COVID-19 are in a state of rapid change based on information released by regulatory bodies including the CDC and federal and state organizations. These policies and algorithms were followed during the patient's care in the ED.    63 year old male who presents with esophageal food bolus; history of esophageal stenosis  requiring dilation 10/21.  Will obtain basic labs, x-ray imaging.  Attempt glucagon and nitroglycerin.   Clinical Course as of Mar 29 705  Fri Mar 30, 2019  0317 No relief with IV glucagon or nitroglycerin.  Patient still spitting into an emesis bag.  Will try small dose IV Ativan for smooth muscle relaxation.   [JS]  0412 No improvement after IV Ativan.  Will discuss case with GI on-call.   [JS]  0421 Discussed case with Dr. Marius Ditch who is on-call for GI.  Will take him to endoscopy later this morning.  Patient updated and agreeable with plan of care.   [JS]    Clinical Course User Index [JS] Paulette Blanch, MD     ____________________________________________   FINAL CLINICAL IMPRESSION(S) / ED DIAGNOSES  Final diagnoses:  Foreign body in esophagus, initial encounter     ED Discharge Orders    None       Note:  This document was prepared using Dragon voice recognition software and may include unintentional dictation errors.   Paulette Blanch, MD 03/30/19 [phone]

## 2019-03-30 NOTE — Op Note (Signed)
[facility] Patient Name: Ruben [name] Procedure Date: 03/30/2019 8:05 AM MRN: [id number] Account #: [id number] Date of Birth: 11-12-1955 Admit Type: Inpatient Age: 63 Room: [facility] ENDO ROOM 2 Gender: Male Note Status: Finalized Procedure:             Upper GI endoscopy Indications:           Esophageal dysphagia, Foreign body in the esophagus Providers:             Lin Landsman MD, MD Medicines:             General Anesthesia Complications:         No immediate complications. Estimated blood loss: None. Procedure:             Pre-Anesthesia Assessment:                        - Prior to the procedure, a History and Physical was                         performed, and patient medications and allergies were                         reviewed. The patient is competent. The risks and                         benefits of the procedure and the sedation options and                         risks were discussed with the patient. All questions                         were answered and informed consent was obtained.                         Patient identification and proposed procedure were                         verified by the physician, the nurse, the                         anesthesiologist, the anesthetist and the technician                         in the pre-procedure area in the procedure room in the                         endoscopy suite. Mental Status Examination: alert and                         oriented. Airway Examination: normal oropharyngeal                         airway and neck mobility. Respiratory Examination:                         clear to auscultation. CV Examination: normal.                         Prophylactic Antibiotics: The  patient does not require                         prophylactic antibiotics. Prior Anticoagulants: The                         patient has taken no previous anticoagulant or   antiplatelet agents. ASA Grade Assessment: III - A                         patient with severe systemic disease. After reviewing                         the risks and benefits, the patient was deemed in                         satisfactory condition to undergo the procedure. The                         anesthesia plan was to use general anesthesia.                         Immediately prior to administration of medications,                         the patient was re-assessed for adequacy to receive                         sedatives. The heart rate, respiratory rate, oxygen                         saturations, blood pressure, adequacy of pulmonary                         ventilation, and response to care were monitored                         throughout the procedure. The physical status of the                         patient was re-assessed after the procedure.                        After obtaining informed consent, the endoscope was                         passed under direct vision. Throughout the procedure,                         the patient's blood pressure, pulse, and oxygen                         saturations were monitored continuously. The Endoscope                         was introduced through the mouth, and advanced to the                         second part of duodenum. The  upper GI endoscopy was                         accomplished without difficulty. The patient tolerated                         the procedure well. Findings:      Food was found in the lower third of the esophagus. Removal of food was       accomplished.      A small hiatal hernia was present.      The entire examined stomach was normal.      The cardia and gastric fundus were normal on retroflexion.      The duodenal bulb and second portion of the duodenum were normal.      LA Grade C (one or more mucosal breaks continuous between tops of 2 or       more mucosal folds, less than 75% circumference)  esophagitis with no       bleeding was found in the lower third of the esophagus. Impression:            - Food in the lower third of the esophagus. Removal                         was successful.                        - Small hiatal hernia.                        - Normal stomach.                        - Normal duodenal bulb and second portion of the                         duodenum.                        - LA Grade C acute and erosive esophagitis with no                         bleeding. Recommendation:        - Discharge patient to home (with escort).                        - Chopped diet and mechanical soft diet.                        - Continue present medications.                        - Follow an antireflux regimen.                        - Use Prilosec (omeprazole) 40 mg PO BID for 3 months. Procedure Code(s):     --- Professional ---                        [phone], Esophagogastroduodenoscopy, flexible,  transoral; with removal of foreign body(s) Diagnosis Code(s):     --- Professional ---                        J17.915A, Food in esophagus causing other injury,                         initial encounter                        K44.9, Diaphragmatic hernia without obstruction or                         gangrene                        K20.80, Other esophagitis without bleeding                        R13.14, Dysphagia, pharyngoesophageal phase                        T18.108A, Unspecified foreign body in esophagus                         causing other injury, initial encounter CPT copyright 2019 [employer] Medical [employer]. All rights reserved. The codes documented in this report are preliminary and upon coder review may  be revised to meet current compliance requirements. Dr. Ulyess Mort Lin Landsman MD, MD 03/30/2019 9:09:02 AM This report has been signed electronically. Number of Addenda: 0 Note Initiated On: 03/30/2019 8:05 AM Estimated Blood  Loss:  Estimated blood loss: none.      [facility]

## 2019-03-31 DIAGNOSIS — J42 Unspecified chronic bronchitis: Secondary | ICD-10-CM | POA: Diagnosis not present

## 2019-03-31 DIAGNOSIS — E785 Hyperlipidemia, unspecified: Secondary | ICD-10-CM

## 2019-03-31 DIAGNOSIS — I5032 Chronic diastolic (congestive) heart failure: Secondary | ICD-10-CM | POA: Diagnosis not present

## 2019-03-31 DIAGNOSIS — T18108A Unspecified foreign body in esophagus causing other injury, initial encounter: Secondary | ICD-10-CM | POA: Diagnosis not present

## 2019-03-31 DIAGNOSIS — F329 Major depressive disorder, single episode, unspecified: Secondary | ICD-10-CM | POA: Diagnosis not present

## 2019-03-31 LAB — CBC
HCT: 37.9 % — ABNORMAL LOW (ref 39.0–52.0)
Hemoglobin: 13.1 g/dL (ref 13.0–17.0)
MCH: 28.5 pg (ref 26.0–34.0)
MCHC: 34.6 g/dL (ref 30.0–36.0)
MCV: 82.4 fL (ref 80.0–100.0)
Platelets: 195 10*3/uL (ref 150–400)
RBC: 4.6 MIL/uL (ref 4.22–5.81)
RDW: 13.1 % (ref 11.5–15.5)
WBC: 7 10*3/uL (ref 4.0–10.5)
nRBC: 0 % (ref 0.0–0.2)

## 2019-03-31 LAB — BASIC METABOLIC PANEL
Anion gap: 8 (ref 5–15)
BUN: 15 mg/dL (ref 8–23)
CO2: 28 mmol/L (ref 22–32)
Calcium: 8.7 mg/dL — ABNORMAL LOW (ref 8.9–10.3)
Chloride: 103 mmol/L (ref 98–111)
Creatinine, Ser: 0.7 mg/dL (ref 0.61–1.24)
GFR calc Af Amer: >60 mL/min (ref >60)
GFR calc non Af Amer: >60 mL/min (ref >60)
Glucose, Bld: 99 mg/dL (ref 70–99)
Potassium: 4.3 mmol/L (ref 3.5–5.1)
Sodium: 139 mmol/L (ref 135–145)

## 2019-03-31 MED ORDER — ORAL CARE MOUTH RINSE: 15.0000 mL | Freq: Two times a day (BID) | OROMUCOSAL | Status: DC

## 2019-03-31 NOTE — Discharge Summary (Signed)
Ruben [name] NAME: Ruben [name]    MR#:  [id number]  DATE OF BIRTH:  1955/04/15  DATE OF ADMISSION:  03/30/2019 ADMITTING PHYSICIAN: Ivor Costa, MD  DATE OF DISCHARGE: 03/31/2019  PRIMARY CARE PHYSICIAN: Ranae Plumber, PA    ADMISSION DIAGNOSIS:  Foreign body in esophagus, initial encounter [Y70.623J] Esophageal foreign body [T18.108A]  DISCHARGE DIAGNOSIS:  foreign body in the esophagus status post extraction  SECONDARY DIAGNOSIS:   Past Medical History:  Diagnosis Date  . Cancer ([place])    lung cancer  . CHF (congestive heart failure) ([place])   . COPD (chronic obstructive pulmonary disease) ([place])   . Coronary artery disease   . Depression   . Hypertension   . Myocardial infarction ([place])   . Stroke [facility])     HOSPITAL COURSE:  Ruben [name] is a 63 y.o. male with medical history significant of esophageal stricture, hypertension, hyperlipidemia, COPD, stroke, GERD, depression with anxiety, CAD, lung cancer, tobacco abuse, possible atrial fibrillation (patient is on digoxin and Cardizem), who presents with food impact.  # Foreign body in esophagus:  -s/p urgent EGD and   food bolus was removed by Dr. Marius Ditch.obs -IV protonix -->pt  on protonix at discharge  # HTN:  -Continue home medications: Metoprolol, Cardizem -oral hydralazine prn  # COPD (chronic obstructive pulmonary disease) ([place]): stable -Continue bronchodilators -sats 94% RA  # Depression: no SI or HI -Continue home medications  # HLD (hyperlipidemia): -lipitor  # GERD (gastroesophageal reflux disease): -on PPI  #CAD (coronary artery disease): No CP -on lipitor, BB  # Chronic diastolic CHF (congestive heart failure) ([place]): - 2D echo on 08/31/2016 showed EF of 65-70% with grade 1 diastolic dysfunction.  Patient does not have leg edema or JVD.  No shortness of breath.  CHF is compensated. -cont all cardiac meds   #Tobacco abuse: -nicotine  patch -discussed smoking cessation  Patient is overall stable and at baseline. Will discharge home. Agreeable with the plan.  DVT ppx: SQ Lovenox Code Status: Full code Family Communication: None at bed side.  Disposition Plan:  discharge back to previous home environment today Consults called:  Dr. Marius Ditch    CONSULTS OBTAINED:  Treatment Team:  Ivor Costa, MD  DRUG ALLERGIES:  No Known Allergies  DISCHARGE MEDICATIONS:   Allergies as of 03/31/2019   No Known Allergies     Medication List    TAKE these medications   albuterol 108 (90 Base) MCG/ACT inhaler Commonly known as: VENTOLIN HFA Inhale 2 puffs into the lungs every 6 (six) hours as needed for wheezing or shortness of breath.   atorvastatin 10 MG tablet Commonly known as: LIPITOR Take 1 tablet (10 mg total) by mouth daily.   diazepam 5 MG tablet Commonly known as: VALIUM Take 1 tablet (5 mg total) by mouth 2 (two) times daily. Do not drive or operate machinery while on medication.   digoxin 0.25 MG tablet Commonly known as: LANOXIN Take 1 tablet (0.25 mg total) by mouth daily.   diltiazem 300 MG 24 hr capsule Commonly known as: CARDIZEM CD Take 1 capsule (300 mg total) by mouth daily.   furosemide 20 MG tablet Commonly known as: Lasix Take 1 tablet (20 mg total) by mouth daily.   ipratropium-albuterol 0.5-2.5 (3) MG/3ML Soln Commonly known as: DUONEB Take 3 mLs by nebulization every 4 (four) hours as needed (shortness of breath).   metoprolol tartrate 25 MG tablet Commonly known as: LOPRESSOR Take 1  tablet (25 mg total) by mouth 2 (two) times daily.   mometasone-formoterol 200-5 MCG/ACT Aero Commonly known as: DULERA Inhale 2 puffs into the lungs 2 (two) times daily.   multivitamin with minerals Tabs tablet Take 1 tablet by mouth daily.   nicotine 21 mg/24hr patch Commonly known as: NICODERM CQ - dosed in mg/24 hours Place 1 patch (21 mg total) onto the skin daily.   omeprazole 40 MG  capsule Commonly known as: PriLOSEC Take 1 capsule (40 mg total) by mouth 2 (two) times daily before a meal.   pantoprazole 40 MG tablet Commonly known as: PROTONIX Take 1 tablet (40 mg total) by mouth daily.   senna-docusate 8.6-50 MG tablet Commonly known as: Senokot-S Take 1 tablet by mouth at bedtime as needed for mild constipation.   sertraline 100 MG tablet Commonly known as: ZOLOFT Take 100 mg by mouth daily.       If you experience worsening of your admission symptoms, develop shortness of breath, life threatening emergency, suicidal or homicidal thoughts you must seek medical attention immediately by calling 911 or calling your MD immediately  if symptoms less severe.  You Must read complete instructions/literature along with all the possible adverse reactions/side effects for all the Medicines you take and that have been prescribed to you. Take any new Medicines after you have completely understood and accept all the possible adverse reactions/side effects.   Please note  You were cared for by a hospitalist during your hospital stay. If you have any questions about your discharge medications or the care you received while you were in the hospital after you are discharged, you can call the unit and asked to speak with the hospitalist on call if the hospitalist that took care of you is not available. Once you are discharged, your primary care physician will handle any further medical issues. Please note that NO REFILLS for any discharge medications will be authorized once you are discharged, as it is imperative that you return to your primary care physician (or establish a relationship with a primary care physician if you do not have one) for your aftercare needs so that they can reassess your need for medications and monitor your lab values. Today   SUBJECTIVE  Doing well. Eating BF   VITAL SIGNS:  Blood pressure (!) 142/73, pulse (!) 56, temperature 98 F (36.7 C),  temperature source Oral, resp. rate 16, height 6' (1.829 m), weight 59 kg, SpO2 92 %.  I/O:    Intake/Output Summary (Last 24 hours) at 03/31/2019 0919 Last data filed at 03/31/2019 0505 Gross per 24 hour  Intake 1640 ml  Output 475 ml  Net 1165 ml    PHYSICAL EXAMINATION:  GENERAL:  63 y.o.-year-old patient lying in the bed with no acute distress. Thin, fraile EYES: Pupils equal, round, reactive to light and accommodation. No scleral icterus. Extraocular muscles intact.  HEENT: Head atraumatic, normocephalic. Oropharynx and nasopharynx clear.  NECK:  Supple, no jugular venous distention. No thyroid enlargement, no tenderness.  LUNGS: Normal breath sounds bilaterally, no wheezing, rales,rhonchi or crepitation. No use of accessory muscles of respiration. Emphysematous chest CARDIOVASCULAR: S1, S2 normal. No murmurs, rubs, or gallops.  ABDOMEN: Soft, non-tender, non-distended. Bowel sounds present. No organomegaly or mass.  EXTREMITIES: No pedal edema, cyanosis, or clubbing.  NEUROLOGIC: Cranial nerves II through XII are intact. Muscle strength 5/5 in all extremities. Sensation intact. Gait not checked.  PSYCHIATRIC: patient is alert and oriented x 3.  SKIN: No obvious rash, lesion,  or ulcer.   DATA REVIEW:   CBC  Recent Labs  Lab 03/31/19 0503  WBC 7.0  HGB 13.1  HCT 37.9*  PLT 195    Chemistries  Recent Labs  Lab 03/31/19 0503  NA 139  K 4.3  CL 103  CO2 28  GLUCOSE 99  BUN 15  CREATININE 0.70  CALCIUM 8.7*    Microbiology Results   Recent Results (from the past 240 hour(s))  Respiratory Panel by RT PCR (Flu A&B, Covid) - Nasopharyngeal Swab     Status: None   Collection Time: 03/30/19  3:40 AM   Specimen: Nasopharyngeal Swab  Result Value Ref Range Status   SARS Coronavirus 2 by RT PCR NEGATIVE NEGATIVE Final    Comment: (NOTE) SARS-CoV-2 target nucleic acids are NOT DETECTED. The SARS-CoV-2 RNA is generally detectable in upper respiratoy specimens  during the acute phase of infection. The lowest concentration of SARS-CoV-2 viral copies this assay can detect is 131 copies/mL. A negative result does not preclude SARS-Cov-2 infection and should not be used as the sole basis for treatment or other patient management decisions. A negative result may occur with  improper specimen collection/handling, submission of specimen other than nasopharyngeal swab, presence of viral mutation(s) within the areas targeted by this assay, and inadequate number of viral copies (<131 copies/mL). A negative result must be combined with clinical observations, patient history, and epidemiological information. The expected result is Negative. Fact Sheet for Patients:  PinkCheek.be Fact Sheet for Healthcare Providers:  GravelBags.it This test is not yet ap proved or cleared by the [place] FDA and  has been authorized for detection and/or diagnosis of SARS-CoV-2 by FDA under an Emergency Use Authorization (EUA). This EUA will remain  in effect (meaning this test can be used) for the duration of the COVID-19 declaration under Section 564(b)(1) of the Act, 21 U.S.C. section 360bbb-3(b)(1), unless the authorization is terminated or revoked sooner.    Influenza A by PCR NEGATIVE NEGATIVE Final   Influenza B by PCR NEGATIVE NEGATIVE Final    Comment: (NOTE) The Xpert Xpress SARS-CoV-2/FLU/RSV assay is intended as an aid in  the diagnosis of influenza from Nasopharyngeal swab specimens and  should not be used as a sole basis for treatment. Nasal washings and  aspirates are unacceptable for Xpert Xpress SARS-CoV-2/FLU/RSV  testing. Fact Sheet for Patients: PinkCheek.be Fact Sheet for Healthcare Providers: GravelBags.it This test is not yet approved or cleared by the [place] FDA and  has been authorized for detection and/or diagnosis of  SARS-CoV-2 by  FDA under an Emergency Use Authorization (EUA). This EUA will remain  in effect (meaning this test can be used) for the duration of the  Covid-19 declaration under Section 564(b)(1) of the Act, 21  U.S.C. section 360bbb-3(b)(1), unless the authorization is  terminated or revoked. Performed at [facility], [address], [place], [place] [address]     RADIOLOGY:  [place] Neck Soft Tissue  Result Date: 03/30/2019 CLINICAL DATA:  Food bolus. Chicken caught in throat. EXAM: NECK SOFT TISSUES - 1+ VIEW COMPARISON:  None. FINDINGS: There is no evidence of retropharyngeal soft tissue swelling or epiglottic enlargement. Patient is edentulous. The cervical airway is unremarkable. No radio-opaque foreign body identified. Soft tissue planes are non suspicious. No soft tissue air. There are carotid calcifications. IMPRESSION: Unremarkable radiographic appearance of neck soft tissues. No radiopaque foreign body. Electronically Signed   By: Keith Rake M.D.   On: 03/30/2019 02:52   DG Chest  2 View  Result Date: 03/30/2019 CLINICAL DATA:  COPD. Chicken caught in throat. EXAM: CHEST - 2 VIEW COMPARISON:  Radiograph 01/29/2019. CT 05/30/2017 FINDINGS: The lungs are hyperinflated. Chronic interstitial coarsening. Linear opacity projecting anteriorly on the lateral view is likely atelectasis/partial collapse of the right middle lobe or lingula. Heart is normal in size. Aortic atherosclerosis. No evidence of pneumomediastinum. Subsegmental atelectasis at the lung bases. No confluent airspace disease. No pulmonary edema or pleural fluid. No pneumothorax. No acute osseous abnormalities are seen. IMPRESSION: 1. Linear opacity projecting anteriorly on the lateral view is likely atelectasis/partial collapse of the right middle lobe or lingula. 2. Hyperinflation and chronic interstitial coarsening consistent with COPD. 3. Aortic Atherosclerosis (ICD10-I70.0) and Emphysema (ICD10-J43.9).  Electronically Signed   By: Keith Rake M.D.   On: 03/30/2019 02:55   DG Chest Port 1 View  Result Date: 03/30/2019 CLINICAL DATA:  Dyspnea. EXAM: PORTABLE CHEST 1 VIEW COMPARISON:  Same day. FINDINGS: The heart size and mediastinal contours are within normal limits. Atherosclerosis of thoracic aorta is noted. No pneumothorax or pleural effusion is noted. Stable right middle lobe opacity is noted which may represent atelectasis or possibly infiltrate. Minimal left basilar subsegmental atelectasis is noted. The visualized skeletal structures are unremarkable. IMPRESSION: Aortic atherosclerosis. Stable right middle lobe opacity is noted which may represent atelectasis or possibly infiltrate. Electronically Signed   By: Marijo Conception M.D.   On: 03/30/2019 11:15     CODE STATUS:     Code Status Orders  (From admission, onward)         Start     Ordered   03/30/19 1634  Full code  Continuous     03/30/19 1633        Code Status History    Date Active Date Inactive Code Status Order ID Comments User Context   01/29/2019 1541 02/03/2019 1512 Full Code [id number]  Saundra Shelling, MD Inpatient   04/25/2018 0919 04/28/2018 2019 Full Code [id number]  Harrie Foreman, MD ED   02/16/2018 1010 02/19/2018 1614 Full Code [id number]  Max Sane, MD Inpatient   05/30/2017 0903 05/31/2017 1342 DNR [id number]  Max Sane, MD Inpatient   05/29/2017 2219 05/30/2017 0903 Full Code [id number]  Salary, Avel Peace, MD Inpatient   04/26/2017 0758 04/29/2017 1634 Full Code [id number]  Harrie Foreman, MD Inpatient   08/25/2016 0626 09/01/2016 1540 Full Code [id number]  Saundra Shelling, MD Inpatient   06/23/2016 1856 06/26/2016 1514 Full Code [id number]  Gladstone Lighter, MD Inpatient   06/28/2015 1309 07/01/2015 1936 Full Code [id number]  Bettey Costa, MD Inpatient   06/28/2015 1109 06/28/2015 1115 DNR [id number]  Bettey Costa, MD ED   09/25/2014 0434 09/26/2014 1441 DNR [id number]  Lance Coon, MD Inpatient   Ad[facility] Care  Planning Activity       TOTAL TIME TAKING CARE OF THIS PATIENT: *40 minutes.    Fritzi Mandes M.D on 03/31/2019 at 9:19 AM  Between 7am to 6pm - Pager - [phone] After 6pm go to [email or web address] - password [email or web address]  [facility]    CC: Primary care physician; Ranae Plumber, [place]

## 2019-03-31 NOTE — Progress Notes (Addendum)
Discharge order received. Patient mental status is at baseline. Vital signs stable . Wean to room air. Discharge instructions given. Patient declined to hear discharge instruction. Pt got upset and thinking that we are trying to rush him out here. This nurse explained that that's not what we are trying to do. He refused morning medication. Patient was rolled down in a wheelchair by staff.

## 2019-05-01 ENCOUNTER — Other Ambulatory Visit: Payer: Medicaid Other | Admitting: Nurse Practitioner

## 2019-05-01 ENCOUNTER — Telehealth: Payer: Self-pay | Admitting: Nurse Practitioner

## 2019-05-01 ENCOUNTER — Other Ambulatory Visit: Payer: Self-pay

## 2019-05-01 NOTE — Telephone Encounter (Signed)
I called Ruben [name] for scheduled telemedicine follow-up palliative care visit, no answer, unable to leave a message

## 2019-05-03 ENCOUNTER — Telehealth: Payer: Self-pay | Admitting: Nurse Practitioner

## 2019-05-03 NOTE — Telephone Encounter (Signed)
Spoke with patient and we have rescheduled the 1/19 Palliative f/u visit to 05/17/19 @ 10 AM.

## 2019-05-17 ENCOUNTER — Telehealth: Payer: Self-pay | Admitting: Nurse Practitioner

## 2019-05-17 ENCOUNTER — Other Ambulatory Visit: Payer: Self-pay

## 2019-05-17 ENCOUNTER — Other Ambulatory Visit: Payer: Medicaid Other | Admitting: Nurse Practitioner

## 2019-05-17 NOTE — Telephone Encounter (Signed)
Called patient to reschedule today's Palliative f/u visit and the number has been disconnected.  I called patient's wife for another number for patient with no answer - left message requesting a return call.  I also called wife's listed work number and when someone answered I asked for wife and they said that I had the wrong number.

## 2019-05-17 NOTE — Telephone Encounter (Signed)
Called patient again just to see if they number would work, still saying it has been disconnected.  I then called Ranae Plumber, PA's office to see if they had any other contact name/numbers and they did not, they had the same numbers that we have.    Will wait and see if patient's wife returns my call.

## 2019-05-29 ENCOUNTER — Telehealth: Payer: Self-pay | Admitting: Nurse Practitioner

## 2019-05-29 NOTE — Telephone Encounter (Signed)
I called Mr. Councilman, number disconnected. I called Ms. [place] Crane (wife currently separated). Ms. [place] Crane endorses that she no longer is part of Mr. Memmott contact list. Ms. Nevada Crane requested not to be contacted further about Mr. Wettstein. Ms. Nevada Crane did take contact information to give to Mr. Garofano.

## 2019-05-31 ENCOUNTER — Telehealth: Payer: Self-pay | Admitting: Nurse Practitioner

## 2019-05-31 NOTE — Telephone Encounter (Signed)
Tried calling patient's number again to reschedule a Palliative f/u visit, number has been disconnected.  I then called Lorelle Gibbs, listed contact person, and was able to speak with her.  I explained that we have been trying to reach patient but his phone has been disconnected.  Ms. Nevada Crane said they were no longer together and I asked her if there was another family member that she could contact to see if there was another number that we could reach him at.  Ms. Nevada Crane said she would try and contact someone and call me back.

## 2019-06-07 ENCOUNTER — Telehealth: Payer: Self-pay | Admitting: Nurse Practitioner

## 2019-06-07 NOTE — Telephone Encounter (Signed)
Spoke with the nurse at Ranae Plumber, PA's office and explained to her that we are unable to schedule any f/u visit with Ruben [name] due to his phone has been disconnected and asked for direction on how to get in touch with him.  RN stated that his last appointment there was back in Nov. 2020 and he was a a No Show.  She has sent a message to Ranae Plumber, [place] asking her if it's okay for [place] to discharge patient from Palliative services and I also told her that if he is seen there again and if Cace Osorto wants [place] to come back out that she can always send [place] another referral.  RN took my number and said she would get back with me

## 2019-06-14 ENCOUNTER — Telehealth: Payer: Self-pay | Admitting: Nurse Practitioner

## 2019-06-14 NOTE — Telephone Encounter (Signed)
Rec'd return call from [place] at [facility] and she said that Ranae Plumber, [place] said it was okay if we discharged patient, since we are unable to reach him to schedule visits.  Notified NP.

## 2020-02-22 ENCOUNTER — Emergency Department: Payer: Medicaid Other | Admitting: Anesthesiology

## 2020-02-22 ENCOUNTER — Encounter
Admission: EM | Disposition: A | Discharge status: Home / Self Care | Payer: Self-pay | Source: Home / Self Care | Attending: Emergency Medicine

## 2020-02-22 ENCOUNTER — Ambulatory Visit
Admission: EM | Admit: 2020-02-22 | Discharge: 2020-02-22 | Disposition: A | Discharge status: Home / Self Care | Payer: Medicaid Other | Attending: Gastroenterology | Admitting: Gastroenterology

## 2020-02-22 ENCOUNTER — Encounter: Payer: Self-pay | Admitting: Emergency Medicine

## 2020-02-22 ENCOUNTER — Other Ambulatory Visit: Payer: Self-pay

## 2020-02-22 ENCOUNTER — Emergency Department: Payer: Medicaid Other

## 2020-02-22 DIAGNOSIS — K2210 Ulcer of esophagus without bleeding: Secondary | ICD-10-CM | POA: Diagnosis not present

## 2020-02-22 DIAGNOSIS — Z79899 Other long term (current) drug therapy: Secondary | ICD-10-CM | POA: Insufficient documentation

## 2020-02-22 DIAGNOSIS — Z85118 Personal history of other malignant neoplasm of bronchus and lung: Secondary | ICD-10-CM | POA: Diagnosis not present

## 2020-02-22 DIAGNOSIS — I252 Old myocardial infarction: Secondary | ICD-10-CM | POA: Insufficient documentation

## 2020-02-22 DIAGNOSIS — F17210 Nicotine dependence, cigarettes, uncomplicated: Secondary | ICD-10-CM | POA: Diagnosis not present

## 2020-02-22 DIAGNOSIS — I2510 Atherosclerotic heart disease of native coronary artery without angina pectoris: Secondary | ICD-10-CM | POA: Insufficient documentation

## 2020-02-22 DIAGNOSIS — Z7951 Long term (current) use of inhaled steroids: Secondary | ICD-10-CM | POA: Diagnosis not present

## 2020-02-22 DIAGNOSIS — Z8673 Personal history of transient ischemic attack (TIA), and cerebral infarction without residual deficits: Secondary | ICD-10-CM | POA: Insufficient documentation

## 2020-02-22 DIAGNOSIS — I110 Hypertensive heart disease with heart failure: Secondary | ICD-10-CM | POA: Insufficient documentation

## 2020-02-22 DIAGNOSIS — T18128A Food in esophagus causing other injury, initial encounter: Secondary | ICD-10-CM | POA: Diagnosis not present

## 2020-02-22 DIAGNOSIS — Z8249 Family history of ischemic heart disease and other diseases of the circulatory system: Secondary | ICD-10-CM | POA: Diagnosis not present

## 2020-02-22 DIAGNOSIS — K222 Esophageal obstruction: Secondary | ICD-10-CM | POA: Diagnosis not present

## 2020-02-22 DIAGNOSIS — R1310 Dysphagia, unspecified: Secondary | ICD-10-CM | POA: Diagnosis not present

## 2020-02-22 DIAGNOSIS — X58XXXA Exposure to other specified factors, initial encounter: Secondary | ICD-10-CM | POA: Insufficient documentation

## 2020-02-22 DIAGNOSIS — I5032 Chronic diastolic (congestive) heart failure: Secondary | ICD-10-CM | POA: Insufficient documentation

## 2020-02-22 DIAGNOSIS — Z833 Family history of diabetes mellitus: Secondary | ICD-10-CM | POA: Insufficient documentation

## 2020-02-22 DIAGNOSIS — Z20822 Contact with and (suspected) exposure to covid-19: Secondary | ICD-10-CM | POA: Diagnosis not present

## 2020-02-22 HISTORY — DX: Food in esophagus causing other injury, initial encounter: T18.128A

## 2020-02-22 HISTORY — PX: ESOPHAGOGASTRODUODENOSCOPY (EGD) WITH PROPOFOL: SHX5813

## 2020-02-22 LAB — CBC WITH DIFFERENTIAL/PLATELET
Abs Immature Granulocytes: 0.04 10*3/uL (ref 0.00–0.07)
Basophils Absolute: 0.1 10*3/uL (ref 0.0–0.1)
Basophils Relative: 1 %
Eosinophils Absolute: 0 10*3/uL (ref 0.0–0.5)
Eosinophils Relative: 1 %
HCT: 46.3 % (ref 39.0–52.0)
Hemoglobin: 15.6 g/dL (ref 13.0–17.0)
Immature Granulocytes: 1 %
Lymphocytes Relative: 12 %
Lymphs Abs: 1 10*3/uL (ref 0.7–4.0)
MCH: 27.7 pg (ref 26.0–34.0)
MCHC: 33.7 g/dL (ref 30.0–36.0)
MCV: 82.1 fL (ref 80.0–100.0)
Monocytes Absolute: 0.7 10*3/uL (ref 0.1–1.0)
Monocytes Relative: 9 %
Neutro Abs: 6.2 10*3/uL (ref 1.7–7.7)
Neutrophils Relative %: 76 %
Platelets: 320 10*3/uL (ref 150–400)
RBC: 5.64 MIL/uL (ref 4.22–5.81)
RDW: 13.2 % (ref 11.5–15.5)
WBC: 8 10*3/uL (ref 4.0–10.5)
nRBC: 0 % (ref 0.0–0.2)

## 2020-02-22 LAB — COMPREHENSIVE METABOLIC PANEL
ALT: 9 U/L (ref 0–44)
AST: 14 U/L — ABNORMAL LOW (ref 15–41)
Albumin: 4.3 g/dL (ref 3.5–5.0)
Alkaline Phosphatase: 69 U/L (ref 38–126)
Anion gap: 14 (ref 5–15)
BUN: 14 mg/dL (ref 8–23)
CO2: 26 mmol/L (ref 22–32)
Calcium: 9.5 mg/dL (ref 8.9–10.3)
Chloride: 99 mmol/L (ref 98–111)
Creatinine, Ser: 0.93 mg/dL (ref 0.61–1.24)
GFR, Estimated: >60 mL/min (ref >60)
Glucose, Bld: 104 mg/dL — ABNORMAL HIGH (ref 70–99)
Potassium: 4.2 mmol/L (ref 3.5–5.1)
Sodium: 139 mmol/L (ref 135–145)
Total Bilirubin: 0.9 mg/dL (ref 0.3–1.2)
Total Protein: 8.1 g/dL (ref 6.5–8.1)

## 2020-02-22 LAB — TYPE AND SCREEN
ABO/RH(D): O POS
Antibody Screen: NEGATIVE

## 2020-02-22 LAB — RESPIRATORY PANEL BY RT PCR (FLU A&B, COVID)
Influenza A by PCR: NEGATIVE
Influenza B by PCR: NEGATIVE
SARS Coronavirus 2 by RT PCR: NEGATIVE

## 2020-02-22 SURGERY — ESOPHAGOGASTRODUODENOSCOPY (EGD) WITH PROPOFOL
Anesthesia: General

## 2020-02-22 MED ORDER — DEXAMETHASONE SODIUM PHOSPHATE 10 MG/ML IJ SOLN
INTRAMUSCULAR | Status: DC | PRN
  Administered 2020-02-22: 10 mg via INTRAVENOUS

## 2020-02-22 MED ORDER — PHENYLEPHRINE HCL (PRESSORS) 10 MG/ML IV SOLN
INTRAVENOUS | Status: DC | PRN
  Administered 2020-02-22: 100 ug via INTRAVENOUS

## 2020-02-22 MED ORDER — SUGAMMADEX SODIUM 200 MG/2ML IV SOLN
INTRAVENOUS | Status: DC | PRN
  Administered 2020-02-22: 200 mg via INTRAVENOUS

## 2020-02-22 MED ORDER — MEPERIDINE HCL 25 MG/ML IJ SOLN: 6.2500 mg | INTRAMUSCULAR | Status: DC | PRN

## 2020-02-22 MED ORDER — ONDANSETRON HCL 4 MG/2ML IJ SOLN
INTRAMUSCULAR | Status: DC | PRN
  Administered 2020-02-22: 4 mg via INTRAVENOUS

## 2020-02-22 MED ORDER — SODIUM CHLORIDE 0.9 % IV BOLUS
500.0000 mL | Freq: Once | INTRAVENOUS | Status: AC
  Administered 2020-02-22: 500 mL via INTRAVENOUS

## 2020-02-22 MED ORDER — OXYCODONE HCL 5 MG/5ML PO SOLN: 5.0000 mg | Freq: Once | ORAL | Status: DC | PRN

## 2020-02-22 MED ORDER — PROPOFOL 10 MG/ML IV BOLUS
INTRAVENOUS | Status: AC
  Filled 2020-02-22: qty 20

## 2020-02-22 MED ORDER — OXYCODONE HCL 5 MG PO TABS: 5.0000 mg | ORAL_TABLET | Freq: Once | ORAL | Status: DC | PRN

## 2020-02-22 MED ORDER — FENTANYL CITRATE (PF) 100 MCG/2ML IJ SOLN
INTRAMUSCULAR | Status: DC | PRN
  Administered 2020-02-22: 50 ug via INTRAVENOUS

## 2020-02-22 MED ORDER — PROPOFOL 10 MG/ML IV BOLUS
INTRAVENOUS | Status: DC | PRN
  Administered 2020-02-22: 100 mg via INTRAVENOUS

## 2020-02-22 MED ORDER — PROMETHAZINE HCL 25 MG/ML IJ SOLN: 6.2500 mg | INTRAMUSCULAR | Status: DC | PRN

## 2020-02-22 MED ORDER — SUCCINYLCHOLINE CHLORIDE 20 MG/ML IJ SOLN
INTRAMUSCULAR | Status: DC | PRN
  Administered 2020-02-22: 120 mg via INTRAVENOUS

## 2020-02-22 MED ORDER — DROPERIDOL 2.5 MG/ML IJ SOLN
0.6250 mg | Freq: Once | INTRAMUSCULAR | Status: DC | PRN
  Filled 2020-02-22: qty 2

## 2020-02-22 MED ORDER — GLUCAGON HCL RDNA (DIAGNOSTIC) 1 MG IJ SOLR: 1.0000 mg | Freq: Once | INTRAMUSCULAR | Status: AC

## 2020-02-22 MED ORDER — MIDAZOLAM HCL 2 MG/2ML IJ SOLN
INTRAMUSCULAR | Status: AC
  Filled 2020-02-22: qty 2

## 2020-02-22 MED ORDER — MIDAZOLAM HCL 2 MG/2ML IJ SOLN
INTRAMUSCULAR | Status: DC | PRN
  Administered 2020-02-22: 2 mg via INTRAVENOUS

## 2020-02-22 MED ORDER — LORAZEPAM 2 MG/ML IJ SOLN: 1.0000 mg | Freq: Once | INTRAMUSCULAR | Status: DC | PRN

## 2020-02-22 MED ORDER — ROCURONIUM BROMIDE 100 MG/10ML IV SOLN
INTRAVENOUS | Status: DC | PRN
  Administered 2020-02-22: 10 mg via INTRAVENOUS
  Administered 2020-02-22: 40 mg via INTRAVENOUS

## 2020-02-22 MED ORDER — GLUCAGON HCL RDNA (DIAGNOSTIC) 1 MG IJ SOLR
INTRAMUSCULAR | Status: AC
  Administered 2020-02-22: 1 mg via INTRAVENOUS
  Filled 2020-02-22: qty 1

## 2020-02-22 MED ORDER — SUGAMMADEX SODIUM 200 MG/2ML IV SOLN: INTRAVENOUS | Status: DC | PRN

## 2020-02-22 MED ORDER — SODIUM CHLORIDE 0.9 % IV SOLN: INTRAVENOUS | Status: DC

## 2020-02-22 MED ORDER — LIDOCAINE HCL (CARDIAC) PF 100 MG/5ML IV SOSY
PREFILLED_SYRINGE | INTRAVENOUS | Status: DC | PRN
  Administered 2020-02-22: 80 mg via INTRAVENOUS

## 2020-02-22 MED ORDER — HYDROMORPHONE HCL 1 MG/ML IJ SOLN: 0.2500 mg | INTRAMUSCULAR | Status: DC | PRN

## 2020-02-22 MED ORDER — FENTANYL CITRATE (PF) 100 MCG/2ML IJ SOLN
INTRAMUSCULAR | Status: AC
  Filled 2020-02-22: qty 2

## 2020-02-22 NOTE — ED Provider Notes (Signed)
[facility] Emergency Department Provider Note   ____________________________________________    I have reviewed the triage vital signs and the nursing notes.   HISTORY  Chief Complaint Food impaction    HPI Ruben [name] is a 64 y.o. male with history of CHF, COPD, esophageal stricture who presents with food impaction.  Patient reports he was eating roast beef last night and he felt a food bolus get lodged in his esophagus.  He has been unable to tolerate any food or liquids since then.  Has some discomfort at the location of where he thinks it is lodged.  He reports this is happened in the past.  Reviewed records demonstrates he was admitted in December 19 of 2020 for similar situation.  He required endoscopic removal at that time  Past Medical History:  Diagnosis Date  . Cancer ([place])    lung cancer  . CHF (congestive heart failure) ([place])   . COPD (chronic obstructive pulmonary disease) ([place])   . Coronary artery disease   . Depression   . Hypertension   . Myocardial infarction ([place])   . Stroke [facility])     Patient Active Problem List   Diagnosis Date Noted  . HTN (hypertension) 03/30/2019  . HLD (hyperlipidemia) 03/30/2019  . Stroke ([place]) 03/30/2019  . GERD (gastroesophageal reflux disease) 03/30/2019  . CAD (coronary artery disease) 03/30/2019  . Chronic diastolic CHF (congestive heart failure) ([place]) 03/30/2019  . Tobacco abuse 03/30/2019  . Foreign body in esophagus   . Protein-calorie malnutrition, severe 02/02/2019  . Major depressive disorder, recurrent episode, moderate ([place])   . COPD (chronic obstructive pulmonary disease) ([place]) 05/29/2017  . Pneumonia 06/28/2015  . COPD with exacerbation ([place]) 09/25/2014  . Depression 09/25/2014  . COPD exacerbation ([place]) 09/25/2014  . Severe recurrent major depression without psychotic features ([place]) 09/25/2014    Past Surgical History:  Procedure Laterality Date  . CARDIAC CATHETERIZATION     . COLONOSCOPY    . ELBOW SURGERY Left   . ESOPHAGOGASTRODUODENOSCOPY N/A 01/31/2019   Procedure: ESOPHAGOGASTRODUODENOSCOPY (EGD);  Surgeon: Toledo, Benay Pike, MD;  Location: [facility] ENDOSCOPY;  Service: Gastroenterology;  Laterality: N/A;  . ESOPHAGOGASTRODUODENOSCOPY (EGD) WITH PROPOFOL N/A 03/30/2019   Procedure: ESOPHAGOGASTRODUODENOSCOPY (EGD) WITH PROPOFOL;  Surgeon: Lin Landsman, MD;  Location[facility] ENDOSCOPY;  Service: Gastroenterology;  Laterality: N/A;  . FLEXIBLE BRONCHOSCOPY Bilateral 08/27/2016   Procedure: FLEXIBLE BRONCHOSCOPY;  Surgeon: Allyne Gee, MD;  Location: [facility] ORS;  Service: Pulmonary;  Laterality: Bilateral;  . WRIST SURGERY Left     Prior to Admission medications   Medication Sig Start Date End Date Taking? Authorizing Provider  albuterol (PROVENTIL HFA;VENTOLIN HFA) 108 (90 Base) MCG/ACT inhaler Inhale 2 puffs into the lungs every 6 (six) hours as needed for wheezing or shortness of breath. 02/19/18   Epifanio Lesches, MD  atorvastatin (LIPITOR) 10 MG tablet Take 1 tablet (10 mg total) by mouth daily. 02/20/18   Epifanio Lesches, MD  diazepam (VALIUM) 5 MG tablet Take 1 tablet (5 mg total) by mouth 2 (two) times daily. Do not drive or operate machinery while on medication. 02/03/19   Stark Jock Jude, MD  digoxin (LANOXIN) 0.25 MG tablet Take 1 tablet (0.25 mg total) by mouth daily. 02/19/18   Epifanio Lesches, MD  diltiazem (CARDIZEM CD) 300 MG 24 hr capsule Take 1 capsule (300 mg total) by mouth daily. 02/03/19   Stark Jock Jude, MD  furosemide (LASIX) 20 MG tablet Take 1 tablet (20 mg total) by mouth  daily. 04/28/18 04/28/19  Hillary Bow, MD  ipratropium-albuterol (DUONEB) 0.5-2.5 (3) MG/3ML SOLN Take 3 mLs by nebulization every 4 (four) hours as needed (shortness of breath).    [provider]  metoprolol tartrate (LOPRESSOR) 25 MG tablet Take 1 tablet (25 mg total) by mouth 2 (two) times daily. 02/19/18   Epifanio Lesches, MD   mometasone-formoterol (DULERA) 200-5 MCG/ACT AERO Inhale 2 puffs into the lungs 2 (two) times daily. 02/19/18   Epifanio Lesches, MD  Multiple Vitamin (MULTIVITAMIN WITH MINERALS) TABS tablet Take 1 tablet by mouth daily. 02/04/19   Stark Jock, Jude, MD  nicotine (NICODERM CQ - DOSED IN MG/24 HOURS) 21 mg/24hr patch Place 1 patch (21 mg total) onto the skin daily. 02/04/19   Stark Jock Jude, MD  omeprazole (PRILOSEC) 40 MG capsule Take 1 capsule (40 mg total) by mouth 2 (two) times daily before a meal. 03/30/19 04/29/19  Vanga, Tally Due, MD  pantoprazole (PROTONIX) 40 MG tablet Take 1 tablet (40 mg total) by mouth daily. 02/04/19   Stark Jock, Jude, MD  senna-docusate (SENOKOT-S) 8.6-50 MG tablet Take 1 tablet by mouth at bedtime as needed for mild constipation. 02/03/19   Stark Jock Jude, MD  sertraline (ZOLOFT) 100 MG tablet Take 100 mg by mouth daily.     [provider]     Allergies Patient has no known allergies.  Family History  Problem Relation Age of Onset  . Heart disease Other   . Hypertension Other   . Diabetes Other   . CAD Mother     Social History Social History   Tobacco Use  . Smoking status: Current Every Day Smoker    Packs/day: 1.00    Years: 43.00    Pack years: 43.00    Types: Cigarettes  . Smokeless tobacco: Never Used  Vaping Use  . Vaping Use: Never used  Substance Use Topics  . Alcohol use: No  . Drug use: Yes    Frequency: 4.0 times per week    Types: Marijuana    Review of Systems  Constitutional: No fever/chills Eyes: No visual changes.  ENT: Not tolerating p.o.'s Cardiovascular: As above Respiratory: Denies shortness of breath. Gastrointestinal: No abdominal pain.    Genitourinary: Negative for dysuria. Musculoskeletal: Negative for back pain. Skin: Negative for rash. Neurological: Negative for headaches   ____________________________________________   PHYSICAL EXAM:  VITAL SIGNS: ED Triage Vitals  Enc Vitals Group     BP 02/22/20  1246 (!) 168/72     Pulse Rate 02/22/20 1246 61     Resp 02/22/20 1246 20     Temp 02/22/20 1246 98.1 F (36.7 C)     Temp Source 02/22/20 1246 Oral     SpO2 02/22/20 1246 96 %     Weight 02/22/20 1247 54.4 kg (120 lb)     Height 02/22/20 1247 1.829 m (6')     Head Circumference --      Peak Flow --      Pain Score 02/22/20 1246 10     Pain Loc --      Pain Edu? --      Excl. in [place]? --     Constitutional: Alert and oriented.   Nose: No congestion/rhinnorhea. Mouth/Throat: Mucous membranes are moist.    Cardiovascular: Normal rate, regular rhythm.  Good peripheral circulation. Respiratory: Normal respiratory effort.  No retractions.  Gastrointestinal: Soft and nontender. No distention.  No CVA tenderness.  Musculoskeletal: No lower extremity tenderness nor edema.  Warm and well perfused Neurologic:  Normal speech and language. No gross focal neurologic deficits are appreciated.  Skin:  Skin is warm, dry and intact. No rash noted. Psychiatric: Mood and affect are normal. Speech and behavior are normal.  ____________________________________________   LABS (all labs ordered are listed, but only abnormal results are displayed)  Labs Reviewed  COMPREHENSIVE METABOLIC PANEL - Abnormal; Notable for the following components:      Result Value   Glucose, Bld 104 (*)    AST 14 (*)    All other components within normal limits  RESPIRATORY PANEL BY RT PCR (FLU A&B, COVID)  CBC WITH DIFFERENTIAL/PLATELET  TYPE AND SCREEN   ____________________________________________  EKG  ED ECG REPORT I, Lavonia Drafts, the attending physician, personally viewed and interpreted this ECG.  Date: 02/22/2020  Rhythm: normal sinus rhythm QRS Axis: normal Intervals: normal ST/T Wave abnormalities: normal Narrative Interpretation: no evidence of acute ischemia  ____________________________________________  RADIOLOGY  Chest x-ray reviewed by me no significant  normality ____________________________________________   PROCEDURES  Procedure(s) performed: No  Procedures   Critical Care performed: No ____________________________________________   INITIAL IMPRESSION / ASSESSMENT AND PLAN / ED COURSE  Pertinent labs & imaging results that were available during my care of the patient were reviewed by me and considered in my medical decision making (see chart for details).  Patient presents with likely esophageal impaction secondary to food bolus.  Exam is overall reassuring, lab work is unremarkable.  Patient has had similar impaction secondary to stricture in the past  We will give IV glucagon to see if this helps pass  Have discussed with Dr. Vicente Males of GI who plans for endoscopy    ____________________________________________   FINAL CLINICAL IMPRESSION(S) / ED DIAGNOSES  Final diagnoses:  Esophageal obstruction due to food impaction        Note:  This document was prepared using Dragon voice recognition software and may include unintentional dictation errors.   Lavonia Drafts, MD 02/22/20 [phone]

## 2020-02-22 NOTE — ED Triage Notes (Signed)
Pt via EMS from home. Pt state he was eating supper last night and possibly got a piece of meat stuck in his throat. Pt on home O2 from COPD. Pt also states he having L sided chest pain when he takes a deep breath. Pt is able to speak in full sentences but states that he is unable to swallow water. Pt has a hx of Endoscopy before for the same. Pt is A&Ox4 and NAD>

## 2020-02-22 NOTE — Op Note (Signed)
[facility] Gastroenterology Patient Name: Ruben [name] Procedure Date: 02/22/2020 2:41 PM MRN: [id number] Account #: [id number] Date of Birth: [birth year] Admit Type: Outpatient Age: 64 Room: [facility] ENDO ROOM 4 Gender: Male Note Status: Finalized Procedure:             Upper GI endoscopy Indications:           Dysphagia Providers:             Jonathon Bellows MD, MD Referring MD:          No Local Md, MD (Referring MD) Medicines:             Monitored Anesthesia Care Complications:         No immediate complications. Procedure:             Pre-Anesthesia Assessment:                        - Prior to the procedure, a History and Physical was                         performed, and patient medications, allergies and                         sensitivities were reviewed. The patient's tolerance                         of previous anesthesia was reviewed.                        - The risks and benefits of the procedure and the                         sedation options and risks were discussed with the                         patient. All questions were answered and informed                         consent was obtained.                        - ASA Grade Assessment: II - A patient with mild                         systemic disease.                        After obtaining informed consent, the endoscope was                         passed under direct vision. Throughout the procedure,                         the patient's blood pressure, pulse, and oxygen                         saturations were monitored continuously. The Endoscope                         was introduced through the mouth, and advanced  to the                         third part of duodenum. The upper GI endoscopy was                         accomplished with ease. The patient tolerated the                         procedure well. Findings:      Food was found at the gastroesophageal junction. Removal was        accomplished with a Raptor grasping device.      Severe esophagitis with no bleeding was found at the gastroesophageal       junction.      One cratered esophageal ulcer with no stigmata of recent bleeding was       found at the gastroesophageal junction. The lesion was 1 mm in largest       dimension.      The stomach was normal.      The examined duodenum was normal. Impression:            - Food was found in the esophagus. Removal was                         successful.                        - Severe erosive esophagitis with no bleeding.                        - Esophageal ulcer with no stigmata of recent bleeding.                        - Normal stomach.                        - Normal examined duodenum. Recommendation:        - Discharge patient to home (with escort).                        - Mechanical soft diet indefinitely.                        - Continue present medications.                        - Prilosec 40 mg BID over the counter for 3 months Procedure Code(s):     --- Professional ---                        [phone], Esophagogastroduodenoscopy, flexible,                         transoral; with removal of foreign body(s) Diagnosis Code(s):     --- Professional ---                        M35.361W, Food in esophagus causing other injury,                         initial encounter  K20.80, Other esophagitis without bleeding                        K22.10, Ulcer of esophagus without bleeding                        R13.10, Dysphagia, unspecified CPT copyright 2019 American Medical Association. All rights reserved. The codes documented in this report are preliminary and upon coder review may  be revised to meet current compliance requirements. Jonathon Bellows, MD Jonathon Bellows MD, MD 02/22/2020 3:00:17 PM This report has been signed electronically. Number of Addenda: 0 Note Initiated On: 02/22/2020 2:41 PM Estimated Blood Loss:  Estimated blood loss: none.       [facility]

## 2020-02-22 NOTE — Anesthesia Postprocedure Evaluation (Signed)
Anesthesia Post Note  Patient: Ruben [name]  Procedure(s) Performed: ESOPHAGOGASTRODUODENOSCOPY (EGD) WITH PROPOFOL (N/A )  Patient location during evaluation: PACU Anesthesia Type: General Level of consciousness: awake and awake and alert Pain management: pain level controlled Vital Signs Assessment: post-procedure vital signs reviewed and stable Respiratory status: spontaneous breathing and nonlabored ventilation Cardiovascular status: blood pressure returned to baseline Postop Assessment: no apparent nausea or vomiting Anesthetic complications: no   No complications documented.   Last Vitals:  Vitals:   02/22/20 1246 02/22/20 1434  BP: (!) 168/72 (!) 185/89  Pulse: 61 62  Resp: 20 20  Temp: 36.7 C (!) 36.1 C  SpO2: 96% 95%    Last Pain:  Vitals:   02/22/20 1434  TempSrc: Temporal  PainSc: 7                  Neva Seat

## 2020-02-22 NOTE — Anesthesia Procedure Notes (Signed)
Procedure Name: Intubation Date/Time: 02/22/2020 2:54 PM Performed by: Nelda Marseille, CRNA Pre-anesthesia Checklist: Patient identified, Patient being monitored, Timeout performed, Emergency Drugs available and Suction available Patient Re-evaluated:Patient Re-evaluated prior to induction Oxygen Delivery Method: Circle system utilized Preoxygenation: Pre-oxygenation with 100% oxygen Induction Type: IV induction Ventilation: Mask ventilation without difficulty Laryngoscope Size: Mac, 3 and McGraph Grade View: Grade I Tube type: Oral Tube size: 7.5 mm Number of attempts: 1 Airway Equipment and Method: Stylet Placement Confirmation: ETT inserted through vocal cords under direct vision,  positive ETCO2 and breath sounds checked- equal and bilateral Secured at: 21 cm Tube secured with: Tape Dental Injury: Teeth and Oropharynx as per pre-operative assessment

## 2020-02-22 NOTE — ED Notes (Signed)
First Nurse Note: Pt to ED via [facility] for difficulty swallowing and shortness of breath. Pt is also having productive cough, slight increased WOB when EMS arrived. Pt was given 1 duoneb treatment. Pt has hx/o esophogeal problems. Pt felt like he swallowed something yesterday and it went down the wrong way, has been unable to swallow since then.

## 2020-02-22 NOTE — ED Notes (Signed)
First and only encounter with pt to give IV glucagon and 500 ml bolus

## 2020-02-22 NOTE — Transfer of Care (Signed)
Immediate Anesthesia Transfer of Care Note  Patient: Ruben [name]  Procedure(s) Performed: ESOPHAGOGASTRODUODENOSCOPY (EGD) WITH PROPOFOL (N/A )  Patient Location: PACU  Anesthesia Type:General  Level of Consciousness: awake, alert  and oriented  Airway & Oxygen Therapy: Patient Spontanous Breathing and Patient connected to face mask oxygen  Post-op Assessment: Report given to RN and Post -op Vital signs reviewed and stable  Post vital signs: Reviewed and stable  Last Vitals:  Vitals Value Taken Time  BP 148/94 02/22/20 1511  Temp    Pulse 96 02/22/20 1515  Resp 17 02/22/20 1515  SpO2 99 % 02/22/20 1515  Vitals shown include unvalidated device data.  Last Pain:  Vitals:   02/22/20 1434  TempSrc: Temporal  PainSc: 7       Patients Stated Pain Goal: 1 (11/94/17 4081)  Complications: No complications documented.

## 2020-02-22 NOTE — H&P (Signed)
Ruben Bellows, MD [address] [place], [place], [place], [address] [id number], [place], [place], [place], [address] Phone: [phone]  Fax: [phone]  Primary Care Physician:  Ranae Plumber, [place]   Pre-Procedure History & Physical: HPI:  Ruben [name] is a 64 y.o. male is here for an endoscopy    Past Medical History:  Diagnosis Date  . Cancer ([place])    lung cancer  . CHF (congestive heart failure) ([place])   . COPD (chronic obstructive pulmonary disease) ([place])   . Coronary artery disease   . Depression   . Food impaction of esophagus   . Food impaction of esophagus   . Food impaction of esophagus   . Hypertension   . Myocardial infarction ([place])   . Stroke [facility])     Past Surgical History:  Procedure Laterality Date  . CARDIAC CATHETERIZATION    . COLONOSCOPY    . ELBOW SURGERY Left   . ESOPHAGOGASTRODUODENOSCOPY N/A 01/31/2019   Procedure: ESOPHAGOGASTRODUODENOSCOPY (EGD);  Surgeon: Toledo, Benay Pike, MD;  Location: [facility] ENDOSCOPY;  Service: Gastroenterology;  Laterality: N/A;  . ESOPHAGOGASTRODUODENOSCOPY (EGD) WITH PROPOFOL N/A 03/30/2019   Procedure: ESOPHAGOGASTRODUODENOSCOPY (EGD) WITH PROPOFOL;  Surgeon: Lin Landsman, MD;  Location: [facility] ENDOSCOPY;  Service: Gastroenterology;  Laterality: N/A;  . FLEXIBLE BRONCHOSCOPY Bilateral 08/27/2016   Procedure: FLEXIBLE BRONCHOSCOPY;  Surgeon: Allyne Gee, MD;  Location: [facility] ORS;  Service: Pulmonary;  Laterality: Bilateral;  . WRIST SURGERY Left     Prior to Admission medications   Medication Sig Start Date End Date Taking? Authorizing Provider  albuterol (PROVENTIL HFA;VENTOLIN HFA) 108 (90 Base) MCG/ACT inhaler Inhale 2 puffs into the lungs every 6 (six) hours as needed for wheezing or shortness of breath. 02/19/18   Epifanio Lesches, MD  atorvastatin (LIPITOR) 10 MG tablet Take 1 tablet (10 mg total) by mouth daily. 02/20/18   Epifanio Lesches, MD  diazepam (VALIUM) 5 MG tablet Take 1  tablet (5 mg total) by mouth 2 (two) times daily. Do not drive or operate machinery while on medication. 02/03/19   Stark Jock Jude, MD  digoxin (LANOXIN) 0.25 MG tablet Take 1 tablet (0.25 mg total) by mouth daily. 02/19/18   Epifanio Lesches, MD  diltiazem (CARDIZEM CD) 300 MG 24 hr capsule Take 1 capsule (300 mg total) by mouth daily. 02/03/19   Stark Jock Jude, MD  furosemide (LASIX) 20 MG tablet Take 1 tablet (20 mg total) by mouth daily. 04/28/18 04/28/19  Hillary Bow, MD  ipratropium-albuterol (DUONEB) 0.5-2.5 (3) MG/3ML SOLN Take 3 mLs by nebulization every 4 (four) hours as needed (shortness of breath).    [provider]  metoprolol tartrate (LOPRESSOR) 25 MG tablet Take 1 tablet (25 mg total) by mouth 2 (two) times daily. 02/19/18   Epifanio Lesches, MD  mometasone-formoterol (DULERA) 200-5 MCG/ACT AERO Inhale 2 puffs into the lungs 2 (two) times daily. 02/19/18   Epifanio Lesches, MD  Multiple Vitamin (MULTIVITAMIN WITH MINERALS) TABS tablet Take 1 tablet by mouth daily. 02/04/19   Stark Jock, Jude, MD  nicotine (NICODERM CQ - DOSED IN MG/24 HOURS) 21 mg/24hr patch Place 1 patch (21 mg total) onto the skin daily. 02/04/19   Stark Jock Jude, MD  omeprazole (PRILOSEC) 40 MG capsule Take 1 capsule (40 mg total) by mouth 2 (two) times daily before a meal. 03/30/19 04/29/19  Vanga, Tally Due, MD  pantoprazole (PROTONIX) 40 MG tablet Take 1 tablet (40 mg total) by mouth daily. 02/04/19   Otila Back, MD  senna-docusate (SENOKOT-S) 8.6-50 MG tablet Take 1 tablet by mouth at bedtime as needed for mild constipation. 02/03/19   Stark Jock Jude, MD  sertraline (ZOLOFT) 100 MG tablet Take 100 mg by mouth daily.     [provider]    Allergies as of 02/22/2020  . (No Known Allergies)    Family History  Problem Relation Age of Onset  . Heart disease Other   . Hypertension Other   . Diabetes Other   . CAD Mother     Social History   Socioeconomic History  . Marital status: Legally  Separated    Spouse name: Not on file  . Number of children: Not on file  . Years of education: Not on file  . Highest education level: Not on file  Occupational History  . Occupation: unemployed  Tobacco Use  . Smoking status: Current Every Day Smoker    Packs/day: 1.00    Years: 43.00    Pack years: 43.00    Types: Cigarettes  . Smokeless tobacco: Never Used  Vaping Use  . Vaping Use: Never used  Substance and Sexual Activity  . Alcohol use: No  . Drug use: Yes    Frequency: 4.0 times per week    Types: Marijuana  . Sexual activity: Yes  Other Topics Concern  . Not on file  Social History Narrative   Independent at baseline.   Social Determinants of Health   Financial Resource Strain:   . Difficulty of Paying Living Expenses: Not on file  Food Insecurity:   . Worried About Charity fundraiser in the Last Year: Not on file  . Ran Out of Food in the Last Year: Not on file  Transportation Needs:   . Lack of Transportation (Medical): Not on file  . Lack of Transportation (Non-Medical): Not on file  Physical Activity:   . Days of Exercise per Week: Not on file  . Minutes of Exercise per Session: Not on file  Stress:   . Feeling of Stress : Not on file  Social Connections:   . Frequency of Communication with Friends and Family: Not on file  . Frequency of Social Gatherings with Friends and Family: Not on file  . Attends Religious Services: Not on file  . Active Member of Clubs or Organizations: Not on file  . Attends Archivist Meetings: Not on file  . Marital Status: Not on file  Intimate Partner Violence:   . Fear of Current or Ex-Partner: Not on file  . Emotionally Abused: Not on file  . Physically Abused: Not on file  . Sexually Abused: Not on file    Review of Systems: See HPI, otherwise negative ROS  Physical Exam: BP (!) 185/89   Pulse 62   Temp (!) 97 F (36.1 C) (Temporal)   Resp 20   Ht 6' (1.829 m)   Wt 54.7 kg   SpO2 95%   BMI  16.34 kg/m  General:   Alert,  pleasant and cooperative in NAD Head:  Normocephalic and atraumatic. Neck:  Supple; no masses or thyromegaly. Lungs:  Clear throughout to auscultation, normal respiratory effort.    Heart:  +S1, +S2, Regular rate and rhythm, No edema. Abdomen:  Soft, nontender and nondistended. Normal bowel sounds, without guarding, and without rebound.   Neurologic:  Alert and  oriented x4;  grossly normal neurologically.  Impression/Plan: JUMAR GREENSTREET is here for an endoscopy  to be performed for  evaluation of food impaction  Risks, benefits, limitations, and alternatives regarding endoscopy have been reviewed with the patient.  Questions have been answered.  All parties agreeable.   Ruben Bellows, MD  02/22/2020, 2:41 PM

## 2020-02-22 NOTE — Consult Note (Signed)
Ruben [name] , MD [address] [place], [place], [place], [address] [address], [place], [place], [place], [address] Phone: [phone]  Fax: [phone]  Consultation  Referring Provider:     Dr Corky Downs  Primary Care Physician:  Ranae Plumber, [place] Primary Gastroenterologist:  Dr.Toledo         Reason for Consultation:     Food impaction  Date of Admission:  02/22/2020 Date of Consultation:  02/22/2020         HPI:   Ruben [name] is a 64 y.o. male presented to the ER with an inability to swallow after having roast beef last night at 4.30 pm  / Similar episodes in 2020 requiring EGD x 2 for food bolus extraction. Esophagitis noted at the lower end of the esophagus.   Did not follow up after with a gastroenterologist .   Denies any heartburn or chest pain, no other complaints. Not on any blood thinners      Past Medical History:  Diagnosis Date  . Cancer ([place])    lung cancer  . CHF (congestive heart failure) ([place])   . COPD (chronic obstructive pulmonary disease) ([place])   . Coronary artery disease   . Depression   . Hypertension   . Myocardial infarction ([place])   . Stroke [facility])     Past Surgical History:  Procedure Laterality Date  . CARDIAC CATHETERIZATION    . COLONOSCOPY    . ELBOW SURGERY Left   . ESOPHAGOGASTRODUODENOSCOPY N/A 01/31/2019   Procedure: ESOPHAGOGASTRODUODENOSCOPY (EGD);  Surgeon: Toledo, Benay Pike, MD;  Location: [facility] ENDOSCOPY;  Service: Gastroenterology;  Laterality: N/A;  . ESOPHAGOGASTRODUODENOSCOPY (EGD) WITH PROPOFOL N/A 03/30/2019   Procedure: ESOPHAGOGASTRODUODENOSCOPY (EGD) WITH PROPOFOL;  Surgeon: Lin Landsman, MD;  Location: [facility] ENDOSCOPY;  Service: Gastroenterology;  Laterality: N/A;  . FLEXIBLE BRONCHOSCOPY Bilateral 08/27/2016   Procedure: FLEXIBLE BRONCHOSCOPY;  Surgeon: Allyne Gee, MD;  Location: [facility] ORS;  Service: Pulmonary;  Laterality: Bilateral;  . WRIST SURGERY Left     Prior to Admission medications    Medication Sig Start Date End Date Taking? Authorizing Provider  albuterol (PROVENTIL HFA;VENTOLIN HFA) 108 (90 Base) MCG/ACT inhaler Inhale 2 puffs into the lungs every 6 (six) hours as needed for wheezing or shortness of breath. 02/19/18   Epifanio Lesches, MD  atorvastatin (LIPITOR) 10 MG tablet Take 1 tablet (10 mg total) by mouth daily. 02/20/18   Epifanio Lesches, MD  diazepam (VALIUM) 5 MG tablet Take 1 tablet (5 mg total) by mouth 2 (two) times daily. Do not drive or operate machinery while on medication. 02/03/19   Stark Jock Jude, MD  digoxin (LANOXIN) 0.25 MG tablet Take 1 tablet (0.25 mg total) by mouth daily. 02/19/18   Epifanio Lesches, MD  diltiazem (CARDIZEM CD) 300 MG 24 hr capsule Take 1 capsule (300 mg total) by mouth daily. 02/03/19   Stark Jock Jude, MD  furosemide (LASIX) 20 MG tablet Take 1 tablet (20 mg total) by mouth daily. 04/28/18 04/28/19  Hillary Bow, MD  ipratropium-albuterol (DUONEB) 0.5-2.5 (3) MG/3ML SOLN Take 3 mLs by nebulization every 4 (four) hours as needed (shortness of breath).    [provider]  metoprolol tartrate (LOPRESSOR) 25 MG tablet Take 1 tablet (25 mg total) by mouth 2 (two) times daily. 02/19/18   Epifanio Lesches, MD  mometasone-formoterol (DULERA) 200-5 MCG/ACT AERO Inhale 2 puffs into the lungs 2 (two) times daily. 02/19/18   Epifanio Lesches, MD  Multiple Vitamin (MULTIVITAMIN WITH MINERALS) TABS tablet Take  1 tablet by mouth daily. 02/04/19   Stark Jock, Jude, MD  nicotine (NICODERM CQ - DOSED IN MG/24 HOURS) 21 mg/24hr patch Place 1 patch (21 mg total) onto the skin daily. 02/04/19   Stark Jock Jude, MD  omeprazole (PRILOSEC) 40 MG capsule Take 1 capsule (40 mg total) by mouth 2 (two) times daily before a meal. 03/30/19 04/29/19  Vanga, Tally Due, MD  pantoprazole (PROTONIX) 40 MG tablet Take 1 tablet (40 mg total) by mouth daily. 02/04/19   Stark Jock, Jude, MD  senna-docusate (SENOKOT-S) 8.6-50 MG tablet Take 1 tablet by mouth at  bedtime as needed for mild constipation. 02/03/19   Stark Jock Jude, MD  sertraline (ZOLOFT) 100 MG tablet Take 100 mg by mouth daily.     [provider]    Family History  Problem Relation Age of Onset  . Heart disease Other   . Hypertension Other   . Diabetes Other   . CAD Mother      Social History   Tobacco Use  . Smoking status: Current Every Day Smoker    Packs/day: 1.00    Years: 43.00    Pack years: 43.00    Types: Cigarettes  . Smokeless tobacco: Never Used  Vaping Use  . Vaping Use: Never used  Substance Use Topics  . Alcohol use: No  . Drug use: Yes    Frequency: 4.0 times per week    Types: Marijuana    Allergies as of 02/22/2020  . (No Known Allergies)    Review of Systems:    All systems reviewed and negative except where noted in HPI.   Physical Exam:  Vital signs in last 24 hours: Temp:  [98.1 F (36.7 C)] 98.1 F (36.7 C) (11/12 1246) Pulse Rate:  [61] 61 (11/12 1246) Resp:  [20] 20 (11/12 1246) BP: (168)/(72) 168/72 (11/12 1246) SpO2:  [96 %] 96 % (11/12 1246) Weight:  [54.4 kg] 54.4 kg (11/12 1247)   General:   Pleasant, cooperative in NAD Head:  Normocephalic and atraumatic. Eyes:   No icterus.   Conjunctiva pink. PERRLA. Ears:  Normal auditory acuity. Neck:  Supple; no masses or thyroidomegaly Lungs: Respirations even and unlabored. Lungs clear to auscultation bilaterally.   No wheezes, crackles, or rhonchi.  Heart:  Regular rate and rhythm;  Without murmur, clicks, rubs or gallops Abdomen:  Soft, nondistended, nontender. Normal bowel sounds. No appreciable masses or hepatomegaly.  No rebound or guarding.  Neurologic:  Alert and oriented x3;  grossly normal neurologically. Skin:  Intact without significant lesions or rashes. Cervical Nodes:  No significant cervical adenopathy. Psych:  Alert and cooperative. Normal affect.  LAB RESULTS: Recent Labs    02/22/20 1250  WBC 8.0  HGB 15.6  HCT 46.3  PLT 320   BMET Recent Labs     02/22/20 1250  NA 139  K 4.2  CL 99  CO2 26  GLUCOSE 104*  BUN 14  CREATININE 0.93  CALCIUM 9.5   LFT Recent Labs    02/22/20 1250  PROT 8.1  ALBUMIN 4.3  AST 14*  ALT 9  ALKPHOS 69  BILITOT 0.9   PT/INR No results for input(s): LABPROT, INR in the last 72 hours.  STUDIES: No results found.    Impression / Plan:   Ruben [name] is a 64 y.o. y/o male with a prior history of food bolus impaction requiring EGD x 2 in 2020 presents with dysphagia after having a roast beef dinner last night consistent with a  food impaction. 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   Plan  1. EGD today  2. Outpatient GI follow up 3. Long term continue Prilosec 40 mg daily    I have discussed alternative options, risks & benefits,  which include, but are not limited to, bleeding, infection, perforation,respiratory complication & drug reaction.  The patient agrees with this plan & written consent will be obtained.     Thank you for involving me in the care of this patient.      LOS: 0 days   Ruben Bellows, MD  02/22/2020, 1:50 PM

## 2020-02-22 NOTE — Anesthesia Preprocedure Evaluation (Signed)
Anesthesia Evaluation    Airway Mallampati: II       Dental  (+) Poor Dentition   Pulmonary pneumonia, resolved, COPD, Current Smoker and Patient abstained from smoking.,    Pulmonary exam normal breath sounds clear to auscultation       Cardiovascular hypertension, + CAD, + Past MI and +CHF  Normal cardiovascular exam Rhythm:Regular Rate:Normal     Neuro/Psych PSYCHIATRIC DISORDERS Depression CVA    GI/Hepatic GERD  ,  Endo/Other    Renal/GU      Musculoskeletal   Abdominal   Peds  Hematology   Anesthesia Other Findings   Reproductive/Obstetrics                             Anesthesia Physical Anesthesia Plan  ASA: III  Anesthesia Plan: General   Post-op Pain Management:    Induction: Intravenous  PONV Risk Score and Plan: 1 and Ondansetron  Airway Management Planned: Oral ETT  Additional Equipment: None  Intra-op Plan:   Post-operative Plan: Extubation in OR  Informed Consent: I have reviewed the patients History and Physical, chart, labs and discussed the procedure including the risks, benefits and alternatives for the proposed anesthesia with the patient or authorized representative who has indicated his/her understanding and acceptance.       Plan Discussed with: CRNA, Anesthesiologist and Surgeon  Anesthesia Plan Comments:         Anesthesia Quick Evaluation

## 2020-02-25 ENCOUNTER — Encounter: Payer: Self-pay | Admitting: Gastroenterology

## 2020-08-19 ENCOUNTER — Emergency Department: Payer: Medicaid Other

## 2020-08-19 ENCOUNTER — Encounter: Payer: Self-pay | Admitting: Emergency Medicine

## 2020-08-19 ENCOUNTER — Other Ambulatory Visit: Payer: Self-pay

## 2020-08-19 ENCOUNTER — Inpatient Hospital Stay
Admission: EM | Admit: 2020-08-19 | Discharge: 2020-08-25 | DRG: 871 | Disposition: A | Discharge status: Hospice | Payer: Medicaid Other | Attending: Internal Medicine | Admitting: Internal Medicine

## 2020-08-19 DIAGNOSIS — I4891 Unspecified atrial fibrillation: Secondary | ICD-10-CM | POA: Diagnosis present

## 2020-08-19 DIAGNOSIS — J439 Emphysema, unspecified: Secondary | ICD-10-CM | POA: Diagnosis present

## 2020-08-19 DIAGNOSIS — K219 Gastro-esophageal reflux disease without esophagitis: Secondary | ICD-10-CM | POA: Diagnosis present

## 2020-08-19 DIAGNOSIS — Z515 Encounter for palliative care: Secondary | ICD-10-CM | POA: Diagnosis not present

## 2020-08-19 DIAGNOSIS — I10 Essential (primary) hypertension: Secondary | ICD-10-CM | POA: Diagnosis not present

## 2020-08-19 DIAGNOSIS — I2510 Atherosclerotic heart disease of native coronary artery without angina pectoris: Secondary | ICD-10-CM | POA: Diagnosis present

## 2020-08-19 DIAGNOSIS — A419 Sepsis, unspecified organism: Principal | ICD-10-CM | POA: Diagnosis present

## 2020-08-19 DIAGNOSIS — F32A Depression, unspecified: Secondary | ICD-10-CM | POA: Diagnosis present

## 2020-08-19 DIAGNOSIS — K222 Esophageal obstruction: Secondary | ICD-10-CM | POA: Diagnosis present

## 2020-08-19 DIAGNOSIS — E43 Unspecified severe protein-calorie malnutrition: Secondary | ICD-10-CM | POA: Diagnosis present

## 2020-08-19 DIAGNOSIS — F17210 Nicotine dependence, cigarettes, uncomplicated: Secondary | ICD-10-CM | POA: Diagnosis present

## 2020-08-19 DIAGNOSIS — R54 Age-related physical debility: Secondary | ICD-10-CM | POA: Diagnosis present

## 2020-08-19 DIAGNOSIS — J9601 Acute respiratory failure with hypoxia: Secondary | ICD-10-CM

## 2020-08-19 DIAGNOSIS — R64 Cachexia: Secondary | ICD-10-CM | POA: Diagnosis present

## 2020-08-19 DIAGNOSIS — C3411 Malignant neoplasm of upper lobe, right bronchus or lung: Secondary | ICD-10-CM

## 2020-08-19 DIAGNOSIS — J9621 Acute and chronic respiratory failure with hypoxia: Secondary | ICD-10-CM

## 2020-08-19 DIAGNOSIS — Z681 Body mass index (BMI) 19 or less, adult: Secondary | ICD-10-CM

## 2020-08-19 DIAGNOSIS — J441 Chronic obstructive pulmonary disease with (acute) exacerbation: Secondary | ICD-10-CM | POA: Diagnosis present

## 2020-08-19 DIAGNOSIS — Z7189 Other specified counseling: Secondary | ICD-10-CM | POA: Diagnosis not present

## 2020-08-19 DIAGNOSIS — I110 Hypertensive heart disease with heart failure: Secondary | ICD-10-CM | POA: Diagnosis present

## 2020-08-19 DIAGNOSIS — I252 Old myocardial infarction: Secondary | ICD-10-CM

## 2020-08-19 DIAGNOSIS — R7989 Other specified abnormal findings of blood chemistry: Secondary | ICD-10-CM | POA: Diagnosis not present

## 2020-08-19 DIAGNOSIS — I639 Cerebral infarction, unspecified: Secondary | ICD-10-CM | POA: Diagnosis present

## 2020-08-19 DIAGNOSIS — E785 Hyperlipidemia, unspecified: Secondary | ICD-10-CM | POA: Diagnosis present

## 2020-08-19 DIAGNOSIS — Z8673 Personal history of transient ischemic attack (TIA), and cerebral infarction without residual deficits: Secondary | ICD-10-CM

## 2020-08-19 DIAGNOSIS — Z66 Do not resuscitate: Secondary | ICD-10-CM | POA: Diagnosis not present

## 2020-08-19 DIAGNOSIS — Z20822 Contact with and (suspected) exposure to covid-19: Secondary | ICD-10-CM | POA: Diagnosis present

## 2020-08-19 DIAGNOSIS — C3431 Malignant neoplasm of lower lobe, right bronchus or lung: Secondary | ICD-10-CM | POA: Diagnosis not present

## 2020-08-19 DIAGNOSIS — Z720 Tobacco use: Secondary | ICD-10-CM | POA: Diagnosis present

## 2020-08-19 DIAGNOSIS — R059 Cough, unspecified: Secondary | ICD-10-CM

## 2020-08-19 DIAGNOSIS — R079 Chest pain, unspecified: Secondary | ICD-10-CM | POA: Diagnosis not present

## 2020-08-19 DIAGNOSIS — Z8249 Family history of ischemic heart disease and other diseases of the circulatory system: Secondary | ICD-10-CM

## 2020-08-19 DIAGNOSIS — I5032 Chronic diastolic (congestive) heart failure: Secondary | ICD-10-CM | POA: Diagnosis present

## 2020-08-19 DIAGNOSIS — I700 Atherosclerosis of aorta: Secondary | ICD-10-CM | POA: Diagnosis present

## 2020-08-19 DIAGNOSIS — F419 Anxiety disorder, unspecified: Secondary | ICD-10-CM | POA: Diagnosis present

## 2020-08-19 DIAGNOSIS — E876 Hypokalemia: Secondary | ICD-10-CM | POA: Diagnosis present

## 2020-08-19 DIAGNOSIS — Z79899 Other long term (current) drug therapy: Secondary | ICD-10-CM

## 2020-08-19 DIAGNOSIS — C3490 Malignant neoplasm of unspecified part of unspecified bronchus or lung: Secondary | ICD-10-CM | POA: Diagnosis present

## 2020-08-19 DIAGNOSIS — F418 Other specified anxiety disorders: Secondary | ICD-10-CM | POA: Diagnosis present

## 2020-08-19 DIAGNOSIS — Z833 Family history of diabetes mellitus: Secondary | ICD-10-CM

## 2020-08-19 LAB — RESP PANEL BY RT-PCR (FLU A&B, COVID) ARPGX2
Influenza A by PCR: NEGATIVE
Influenza B by PCR: NEGATIVE
SARS Coronavirus 2 by RT PCR: NEGATIVE

## 2020-08-19 LAB — CBC WITH DIFFERENTIAL/PLATELET
Abs Immature Granulocytes: 0.02 10*3/uL (ref 0.00–0.07)
Basophils Absolute: 0.1 10*3/uL (ref 0.0–0.1)
Basophils Relative: 1 %
Eosinophils Absolute: 0 10*3/uL (ref 0.0–0.5)
Eosinophils Relative: 0 %
HCT: 42.9 % (ref 39.0–52.0)
Hemoglobin: 14.2 g/dL (ref 13.0–17.0)
Immature Granulocytes: 0 %
Lymphocytes Relative: 9 %
Lymphs Abs: 0.9 10*3/uL (ref 0.7–4.0)
MCH: 27.3 pg (ref 26.0–34.0)
MCHC: 33.1 g/dL (ref 30.0–36.0)
MCV: 82.3 fL (ref 80.0–100.0)
Monocytes Absolute: 1.2 10*3/uL — ABNORMAL HIGH (ref 0.1–1.0)
Monocytes Relative: 13 %
Neutro Abs: 7.4 10*3/uL (ref 1.7–7.7)
Neutrophils Relative %: 77 %
Platelets: 281 10*3/uL (ref 150–400)
RBC: 5.21 MIL/uL (ref 4.22–5.81)
RDW: 13.7 % (ref 11.5–15.5)
WBC: 9.6 10*3/uL (ref 4.0–10.5)
nRBC: 0 % (ref 0.0–0.2)

## 2020-08-19 LAB — BLOOD GAS, VENOUS
Acid-Base Excess: 2.7 mmol/L — ABNORMAL HIGH (ref 0.0–2.0)
Bicarbonate: 27.9 mmol/L (ref 20.0–28.0)
O2 Saturation: 99.4 %
Patient temperature: 37
pCO2, Ven: 44 mmHg (ref 44.0–60.0)
pH, Ven: 7.41 (ref 7.250–7.430)
pO2, Ven: 160 mmHg — ABNORMAL HIGH (ref 32.0–45.0)

## 2020-08-19 LAB — LACTIC ACID, PLASMA
Lactic Acid, Venous: 1.3 mmol/L (ref 0.5–1.9)
Lactic Acid, Venous: 1 mmol/L (ref 0.5–1.9)

## 2020-08-19 LAB — COMPREHENSIVE METABOLIC PANEL
ALT: 6 U/L (ref 0–44)
AST: 13 U/L — ABNORMAL LOW (ref 15–41)
Albumin: 3.9 g/dL (ref 3.5–5.0)
Alkaline Phosphatase: 69 U/L (ref 38–126)
Anion gap: 12 (ref 5–15)
BUN: 10 mg/dL (ref 8–23)
CO2: 26 mmol/L (ref 22–32)
Calcium: 9.3 mg/dL (ref 8.9–10.3)
Chloride: 97 mmol/L — ABNORMAL LOW (ref 98–111)
Creatinine, Ser: 0.65 mg/dL (ref 0.61–1.24)
GFR, Estimated: >60 mL/min (ref >60)
Glucose, Bld: 109 mg/dL — ABNORMAL HIGH (ref 70–99)
Potassium: 3.6 mmol/L (ref 3.5–5.1)
Sodium: 135 mmol/L (ref 135–145)
Total Bilirubin: 0.8 mg/dL (ref 0.3–1.2)
Total Protein: 8 g/dL (ref 6.5–8.1)

## 2020-08-19 LAB — D-DIMER, QUANTITATIVE: D-Dimer, Quant: 0.7 ug/mL-FEU — ABNORMAL HIGH (ref 0.00–0.50)

## 2020-08-19 LAB — PROCALCITONIN: Procalcitonin: <0.1 ng/mL

## 2020-08-19 LAB — MAGNESIUM: Magnesium: 1.9 mg/dL (ref 1.7–2.4)

## 2020-08-19 LAB — BRAIN NATRIURETIC PEPTIDE: B Natriuretic Peptide: 69.6 pg/mL (ref 0.0–100.0)

## 2020-08-19 LAB — TROPONIN I (HIGH SENSITIVITY): Troponin I (High Sensitivity): 7 ng/L (ref <18)

## 2020-08-19 LAB — DIGOXIN LEVEL: Digoxin Level: <0.2 ng/mL — ABNORMAL LOW (ref 0.8–2.0)

## 2020-08-19 MED ORDER — METOPROLOL TARTRATE 25 MG PO TABS
25.0000 mg | ORAL_TABLET | Freq: Two times a day (BID) | ORAL | Status: DC
  Administered 2020-08-19 – 2020-08-23 (×8): 25 mg via ORAL
  Filled 2020-08-19 (×8): qty 1

## 2020-08-19 MED ORDER — METHYLPREDNISOLONE SODIUM SUCC 40 MG IJ SOLR: 40.0000 mg | Freq: Two times a day (BID) | INTRAMUSCULAR | Status: DC

## 2020-08-19 MED ORDER — ONDANSETRON HCL 4 MG/2ML IJ SOLN
4.0000 mg | Freq: Three times a day (TID) | INTRAMUSCULAR | Status: DC | PRN
  Administered 2020-08-23: 4 mg via INTRAVENOUS
  Filled 2020-08-19: qty 2

## 2020-08-19 MED ORDER — SODIUM CHLORIDE 0.9% FLUSH
10.0000 mL | Freq: Two times a day (BID) | INTRAVENOUS | Status: DC
  Administered 2020-08-19 – 2020-08-20 (×2): 10 mL via INTRAVENOUS

## 2020-08-19 MED ORDER — IPRATROPIUM-ALBUTEROL 0.5-2.5 (3) MG/3ML IN SOLN
3.0000 mL | RESPIRATORY_TRACT | Status: DC
  Administered 2020-08-19 (×3): 3 mL via RESPIRATORY_TRACT
  Filled 2020-08-19 (×3): qty 3

## 2020-08-19 MED ORDER — HYDRALAZINE HCL 20 MG/ML IJ SOLN: 5.0000 mg | INTRAMUSCULAR | Status: DC | PRN

## 2020-08-19 MED ORDER — ENOXAPARIN SODIUM 40 MG/0.4ML IJ SOSY
40.0000 mg | PREFILLED_SYRINGE | INTRAMUSCULAR | Status: DC
  Administered 2020-08-19 – 2020-08-22 (×4): 40 mg via SUBCUTANEOUS
  Filled 2020-08-19 (×4): qty 0.4

## 2020-08-19 MED ORDER — AZITHROMYCIN 250 MG PO TABS
250.0000 mg | ORAL_TABLET | Freq: Every day | ORAL | Status: AC
  Administered 2020-08-20 – 2020-08-23 (×4): 250 mg via ORAL
  Filled 2020-08-19 (×4): qty 1

## 2020-08-19 MED ORDER — ALBUTEROL SULFATE (2.5 MG/3ML) 0.083% IN NEBU: 2.5000 mg | INHALATION_SOLUTION | RESPIRATORY_TRACT | Status: DC | PRN

## 2020-08-19 MED ORDER — ACETAMINOPHEN 325 MG PO TABS: 650.0000 mg | ORAL_TABLET | Freq: Four times a day (QID) | ORAL | Status: DC | PRN

## 2020-08-19 MED ORDER — IPRATROPIUM-ALBUTEROL 0.5-2.5 (3) MG/3ML IN SOLN
9.0000 mL | Freq: Once | RESPIRATORY_TRACT | Status: AC
  Administered 2020-08-19: 9 mL via RESPIRATORY_TRACT
  Filled 2020-08-19: qty 9

## 2020-08-19 MED ORDER — ENSURE ENLIVE PO LIQD
237.0000 mL | Freq: Two times a day (BID) | ORAL | Status: DC
  Administered 2020-08-19: 237 mL via ORAL

## 2020-08-19 MED ORDER — NICOTINE 21 MG/24HR TD PT24
21.0000 mg | MEDICATED_PATCH | Freq: Every day | TRANSDERMAL | Status: DC
  Administered 2020-08-19 – 2020-08-25 (×7): 21 mg via TRANSDERMAL
  Filled 2020-08-19 (×7): qty 1

## 2020-08-19 MED ORDER — ATORVASTATIN CALCIUM 20 MG PO TABS
20.0000 mg | ORAL_TABLET | Freq: Every day | ORAL | Status: DC
  Administered 2020-08-20 – 2020-08-22 (×3): 20 mg via ORAL
  Filled 2020-08-19 (×3): qty 1

## 2020-08-19 MED ORDER — DIAZEPAM 5 MG PO TABS
10.0000 mg | ORAL_TABLET | Freq: Two times a day (BID) | ORAL | Status: DC
  Administered 2020-08-19 – 2020-08-25 (×12): 10 mg via ORAL
  Filled 2020-08-19 (×12): qty 2

## 2020-08-19 MED ORDER — DIGOXIN 250 MCG PO TABS
0.2500 mg | ORAL_TABLET | Freq: Every day | ORAL | Status: DC
  Administered 2020-08-20 – 2020-08-23 (×4): 0.25 mg via ORAL
  Filled 2020-08-19 (×4): qty 1

## 2020-08-19 MED ORDER — METHYLPREDNISOLONE SODIUM SUCC 40 MG IJ SOLR
40.0000 mg | Freq: Two times a day (BID) | INTRAMUSCULAR | Status: DC
  Administered 2020-08-19 – 2020-08-20 (×3): 40 mg via INTRAVENOUS
  Filled 2020-08-19 (×3): qty 1

## 2020-08-19 MED ORDER — DM-GUAIFENESIN ER 30-600 MG PO TB12
1.0000 | ORAL_TABLET | Freq: Two times a day (BID) | ORAL | Status: DC | PRN
  Administered 2020-08-20: 1 via ORAL
  Filled 2020-08-19: qty 1

## 2020-08-19 MED ORDER — NITROGLYCERIN 0.4 MG SL SUBL: 0.4000 mg | SUBLINGUAL_TABLET | SUBLINGUAL | Status: DC | PRN

## 2020-08-19 MED ORDER — FUROSEMIDE 20 MG PO TABS
20.0000 mg | ORAL_TABLET | Freq: Every day | ORAL | Status: DC
  Administered 2020-08-20 – 2020-08-23 (×4): 20 mg via ORAL
  Filled 2020-08-19 (×4): qty 1

## 2020-08-19 MED ORDER — IPRATROPIUM-ALBUTEROL 0.5-2.5 (3) MG/3ML IN SOLN
3.0000 mL | Freq: Four times a day (QID) | RESPIRATORY_TRACT | Status: DC
  Administered 2020-08-20 – 2020-08-23 (×14): 3 mL via RESPIRATORY_TRACT
  Filled 2020-08-19 (×13): qty 3

## 2020-08-19 MED ORDER — IOHEXOL 350 MG/ML SOLN
75.0000 mL | Freq: Once | INTRAVENOUS | Status: AC | PRN
  Administered 2020-08-19: 75 mL via INTRAVENOUS

## 2020-08-19 MED ORDER — DILTIAZEM HCL ER COATED BEADS 300 MG PO CP24
300.0000 mg | ORAL_CAPSULE | Freq: Every day | ORAL | Status: DC
  Administered 2020-08-20 – 2020-08-23 (×4): 300 mg via ORAL
  Filled 2020-08-19 (×4): qty 1

## 2020-08-19 MED ORDER — PANTOPRAZOLE SODIUM 40 MG PO TBEC
40.0000 mg | DELAYED_RELEASE_TABLET | Freq: Two times a day (BID) | ORAL | Status: DC
  Administered 2020-08-19 – 2020-08-23 (×8): 40 mg via ORAL
  Filled 2020-08-19 (×8): qty 1

## 2020-08-19 MED ORDER — AZITHROMYCIN 500 MG PO TABS
500.0000 mg | ORAL_TABLET | Freq: Every day | ORAL | Status: AC
  Administered 2020-08-19: 500 mg via ORAL
  Filled 2020-08-19: qty 1

## 2020-08-19 NOTE — ED Triage Notes (Signed)
Patient arrives via [facility] for SOB that has got progressively worse over the past weekend. Patient has hx of COPD and did several neb treatments at home with no relief. Patient AOx4 at this time.

## 2020-08-19 NOTE — ED Notes (Signed)
Messaged RN bed assigned [id number]

## 2020-08-19 NOTE — ED Notes (Signed)
Patient unable to sign MSE at this time due to patient condition.

## 2020-08-19 NOTE — ED Notes (Signed)
Patient to CT on 4L [place].

## 2020-08-19 NOTE — Plan of Care (Signed)
  Problem: Education: Goal: Knowledge of disease or condition will improve Outcome: Progressing   Problem: Activity: Goal: Ability to tolerate increased activity will improve Outcome: Not Progressing

## 2020-08-19 NOTE — H&P (Signed)
History and Physical    Ruben [name] KTG:[id number] DOB: 02/27/1956 DOA: 08/19/2020  Referring MD/NP/PA:   PCP: Ranae Plumber, [place]   Patient coming from:  The patient is coming from home.  At baseline, pt is independent for most of ADL.        Chief Complaint: SOB  HPI: Ruben [name] is a 65 y.o. male with medical history significant of esophageal stricture, hypertension, hyperlipidemia, COPD, stroke, GERD, depression with anxiety, CAD, lung cancer, tobacco abuse, possible atrial fibrillation (patient is on digoxin and Cardizem), who presents with SOB.  Patient states that his shortness breath has been going on for more than 3 days, which has been progressively worsening.  Patient has dry cough, chest tightness, no fever or chills.  Denies active chest pain.  No nausea, vomiting, diarrhea or abdominal pain.  No symptoms of UTI.  Patient is not using oxygen at home.  Patient has significant respiratory distress in ED, cannot speak full sentence.  His oxygen saturation to 88% on room air, BiPAP was started.  No symptoms of UTI.  No unilateral weakness.  ED Course: pt was found to have WBC 9.6, troponin level 7, negative COVID PCR, positive D-dimer 0.7, lactic acid 1.3, electrolytes renal function okay, temperature normal, blood pressure 145/85, heart rate 103, RR 33, chest x-ray showed a bronchitic change.  CT angiogram is negative for PE, but showed right upper lobe speculated lung mass.  Patient is admitted to progressive bed as inpatient.  NP, Faythe Casa of oncology is consulted.  Review of Systems:   General: no fevers, chills, no body weight gain, has fatigue HEENT: no blurry vision, hearing changes or sore throat Respiratory: has dyspnea, coughing, wheezing CV: no chest pain, no palpitations GI: no nausea, vomiting, abdominal pain, diarrhea, constipation GU: no dysuria, burning on urination, increased urinary frequency, hematuria  Ext: no leg edema Neuro: no unilateral  weakness, numbness, or tingling, no vision change or hearing loss Skin: no rash, no skin tear. MSK: No muscle spasm, no deformity, no limitation of range of movement in spin Heme: No easy bruising.  Travel history: No recent long distant travel.  Allergy: No Known Allergies  Past Medical History:  Diagnosis Date  . Cancer ([place])    lung cancer  . CHF (congestive heart failure) ([place])   . COPD (chronic obstructive pulmonary disease) ([place])   . Coronary artery disease   . Depression   . Food impaction of esophagus   . Food impaction of esophagus   . Food impaction of esophagus   . Hypertension   . Myocardial infarction ([place])   . Stroke [facility])     Past Surgical History:  Procedure Laterality Date  . CARDIAC CATHETERIZATION    . COLONOSCOPY    . ELBOW SURGERY Left   . ESOPHAGOGASTRODUODENOSCOPY N/A 01/31/2019   Procedure: ESOPHAGOGASTRODUODENOSCOPY (EGD);  Surgeon: Toledo, Benay Pike, MD;  Location: [facility] ENDOSCOPY;  Service: Gastroenterology;  Laterality: N/A;  . ESOPHAGOGASTRODUODENOSCOPY (EGD) WITH PROPOFOL N/A 03/30/2019   Procedure: ESOPHAGOGASTRODUODENOSCOPY (EGD) WITH PROPOFOL;  Surgeon: Lin Landsman, MD;  Location: [facility] ENDOSCOPY;  Service: Gastroenterology;  Laterality: N/A;  . ESOPHAGOGASTRODUODENOSCOPY (EGD) WITH PROPOFOL N/A 02/22/2020   Procedure: ESOPHAGOGASTRODUODENOSCOPY (EGD) WITH PROPOFOL;  Surgeon: Jonathon Bellows, MD;  Location: [facility] ENDOSCOPY;  Service: Gastroenterology;  Laterality: N/A;  . FLEXIBLE BRONCHOSCOPY Bilateral 08/27/2016   Procedure: FLEXIBLE BRONCHOSCOPY;  Surgeon: Allyne Gee, MD;  Location: [facility] ORS;  Service: Pulmonary;  Laterality: Bilateral;  . WRIST SURGERY Left  Social History:  reports that he has been smoking cigarettes. He has a 43.00 pack-year smoking history. He has never used smokeless tobacco. He reports current drug use. Frequency: 4.00 times per week. Drug: Marijuana. He reports that he does not drink alcohol.  Family History:   Family History  Problem Relation Age of Onset  . Heart disease Other   . Hypertension Other   . Diabetes Other   . CAD Mother      Prior to Admission medications   Medication Sig Start Date End Date Taking? Authorizing Provider  albuterol (PROVENTIL HFA;VENTOLIN HFA) 108 (90 Base) MCG/ACT inhaler Inhale 2 puffs into the lungs every 6 (six) hours as needed for wheezing or shortness of breath. 02/19/18  Yes Epifanio Lesches, MD  atorvastatin (LIPITOR) 20 MG tablet Take 1 tablet by mouth at bedtime. 05/03/20  Yes [provider]  diazepam (VALIUM) 10 MG tablet Take 10 mg by mouth 2 (two) times daily. 07/28/20  Yes [provider]  digoxin (LANOXIN) 0.25 MG tablet Take 1 tablet (0.25 mg total) by mouth daily. 02/19/18  Yes Epifanio Lesches, MD  diltiazem (CARDIZEM CD) 300 MG 24 hr capsule Take 1 capsule (300 mg total) by mouth daily. 02/03/19  Yes Ojie, Jude, MD  ipratropium-albuterol (DUONEB) 0.5-2.5 (3) MG/3ML SOLN Take 3 mLs by nebulization every 4 (four) hours as needed (shortness of breath).   Yes [provider]  metoprolol tartrate (LOPRESSOR) 25 MG tablet Take 1 tablet (25 mg total) by mouth 2 (two) times daily. 02/19/18  Yes Epifanio Lesches, MD  atorvastatin (LIPITOR) 10 MG tablet Take 1 tablet (10 mg total) by mouth daily. Patient not taking: No sig reported 02/20/18   Epifanio Lesches, MD  diazepam (VALIUM) 5 MG tablet Take 1 tablet (5 mg total) by mouth 2 (two) times daily. Do not drive or operate machinery while on medication. Patient not taking: No sig reported 02/03/19   Otila Back, MD  furosemide (LASIX) 20 MG tablet Take 1 tablet (20 mg total) by mouth daily. 04/28/18 04/28/19  Hillary Bow, MD  mometasone-formoterol (DULERA) 200-5 MCG/ACT AERO Inhale 2 puffs into the lungs 2 (two) times daily. Patient not taking: No sig reported 02/19/18   Epifanio Lesches, MD  Multiple Vitamin (MULTIVITAMIN WITH MINERALS) TABS tablet Take 1 tablet  by mouth daily. Patient not taking: No sig reported 02/04/19   Stark Jock, Jude, MD  nicotine (NICODERM CQ - DOSED IN MG/24 HOURS) 21 mg/24hr patch Place 1 patch (21 mg total) onto the skin daily. 02/04/19   Stark Jock Jude, MD  omeprazole (PRILOSEC) 40 MG capsule Take 1 capsule (40 mg total) by mouth 2 (two) times daily before a meal. 03/30/19 04/29/19  Vanga, Tally Due, MD  pantoprazole (PROTONIX) 40 MG tablet Take 1 tablet (40 mg total) by mouth daily. Patient not taking: Reported on 08/19/2020 02/04/19   Otila Back, MD  senna-docusate (SENOKOT-S) 8.6-50 MG tablet Take 1 tablet by mouth at bedtime as needed for mild constipation. Patient not taking: No sig reported 02/03/19   Otila Back, MD  sertraline (ZOLOFT) 100 MG tablet Take 100 mg by mouth daily.  Patient not taking: No sig reported    [provider]    Physical Exam: Vitals:   08/19/20 1305 08/19/20 1523 08/19/20 1547 08/19/20 1937  BP:   127/73 (!) 141/81  Pulse:   89 86  Resp:   16 20  Temp:   98 F (36.7 C) 98.6 F (37 C)  TempSrc:   Oral  SpO2: 97% 96% 98% 99%  Weight:      Height:       General: Not in acute distress.  Cachectic looking, thin body habitus HEENT:       Eyes: PERRL, EOMI, no scleral icterus.       ENT: No discharge from the ears and nose, no pharynx injection, no tonsillar enlargement.        Neck: No JVD, no bruit, no mass felt. Heme: No neck lymph node enlargement. Cardiac: S1/S2, RRR, No murmurs, No gallops or rubs. Respiratory: Has wheezing bilaterally GI: Soft, nondistended, nontender, no rebound pain, no organomegaly, BS present. GU: No hematuria Ext: No pitting leg edema bilaterally. 1+DP/PT pulse bilaterally. Musculoskeletal: No joint deformities, No joint redness or warmth, no limitation of ROM in spin. Skin: No rashes.  Neuro: Alert, oriented X3, cranial nerves II-XII grossly intact, moves all extremities normally.   Psych: Patient is not psychotic, no suicidal or hemocidal  ideation.  Labs on Admission: I have personally reviewed following labs and imaging studies  CBC: Recent Labs  Lab 08/19/20 0745  WBC 9.6  NEUTROABS 7.4  HGB 14.2  HCT 42.9  MCV 82.3  PLT 329   Basic Metabolic Panel: Recent Labs  Lab 08/19/20 0745  NA 135  K 3.6  CL 97*  CO2 26  GLUCOSE 109*  BUN 10  CREATININE 0.65  CALCIUM 9.3  MG 1.9   GFR: Estimated Creatinine Clearance: 74.3 mL/min (by C-G formula based on SCr of 0.65 mg/dL). Liver Function Tests: Recent Labs  Lab 08/19/20 0745  AST 13*  ALT 6  ALKPHOS 69  BILITOT 0.8  PROT 8.0  ALBUMIN 3.9   No results for input(s): LIPASE, AMYLASE in the last 168 hours. No results for input(s): AMMONIA in the last 168 hours. Coagulation Profile: No results for input(s): INR, PROTIME in the last 168 hours. Cardiac Enzymes: No results for input(s): CKTOTAL, CKMB, CKMBINDEX, TROPONINI in the last 168 hours. BNP (last 3 results) No results for input(s): PROBNP in the last 8760 hours. HbA1C: No results for input(s): HGBA1C in the last 72 hours. CBG: No results for input(s): GLUCAP in the last 168 hours. Lipid Profile: No results for input(s): CHOL, HDL, LDLCALC, TRIG, CHOLHDL, LDLDIRECT in the last 72 hours. Thyroid Function Tests: No results for input(s): TSH, T4TOTAL, FREET4, T3FREE, THYROIDAB in the last 72 hours. Anemia Panel: No results for input(s): VITAMINB12, FOLATE, FERRITIN, TIBC, IRON, RETICCTPCT in the last 72 hours. Urine analysis:    Component Value Date/Time   COLORURINE Yellow 06/01/2013 1013   APPEARANCEUR Clear 06/01/2013 1013   LABSPEC 1.017 06/01/2013 1013   PHURINE 5.0 06/01/2013 1013   GLUCOSEU Negative 06/01/2013 1013   HGBUR Negative 06/01/2013 1013   BILIRUBINUR Negative 06/01/2013 1013   KETONESUR Negative 06/01/2013 1013   PROTEINUR 100 mg/dL 06/01/2013 1013   NITRITE Negative 06/01/2013 1013   LEUKOCYTESUR Negative 06/01/2013 1013   Sepsis  Labs: _0 (procalcitonin:4,lacticidven:4) ) Recent Results (from the past 240 hour(s))  Resp Panel by RT-PCR (Flu A&B, Covid) Nasopharyngeal Swab     Status: None   Collection Time: 08/19/20  7:45 AM   Specimen: Nasopharyngeal Swab; Nasopharyngeal(NP) swabs in vial transport medium  Result Value Ref Range Status   SARS Coronavirus 2 by RT PCR NEGATIVE NEGATIVE Final    Comment: (NOTE) SARS-CoV-2 target nucleic acids are NOT DETECTED.  The SARS-CoV-2 RNA is generally detectable in upper respiratory specimens during the acute phase of infection. The lowest concentration of SARS-CoV-2 viral  copies this assay can detect is 138 copies/mL. A negative result does not preclude SARS-Cov-2 infection and should not be used as the sole basis for treatment or other patient management decisions. A negative result may occur with  improper specimen collection/handling, submission of specimen other than nasopharyngeal swab, presence of viral mutation(s) within the areas targeted by this assay, and inadequate number of viral copies(<138 copies/mL). A negative result must be combined with clinical observations, patient history, and epidemiological information. The expected result is Negative.  Fact Sheet for Patients:  EntrepreneurPulse.com.au  Fact Sheet for Healthcare Providers:  IncredibleEmployment.be  This test is no t yet approved or cleared by the [place] FDA and  has been authorized for detection and/or diagnosis of SARS-CoV-2 by FDA under an Emergency Use Authorization (EUA). This EUA will remain  in effect (meaning this test can be used) for the duration of the COVID-19 declaration under Section 564(b)(1) of the Act, 21 U.S.C.section 360bbb-3(b)(1), unless the authorization is terminated  or revoked sooner.       Influenza A by PCR NEGATIVE NEGATIVE Final   Influenza B by PCR NEGATIVE NEGATIVE Final    Comment: (NOTE) The Xpert Xpress  SARS-CoV-2/FLU/RSV plus assay is intended as an aid in the diagnosis of influenza from Nasopharyngeal swab specimens and should not be used as a sole basis for treatment. Nasal washings and aspirates are unacceptable for Xpert Xpress SARS-CoV-2/FLU/RSV testing.  Fact Sheet for Patients: EntrepreneurPulse.com.au  Fact Sheet for Healthcare Providers: IncredibleEmployment.be  This test is not yet approved or cleared by the [place] FDA and has been authorized for detection and/or diagnosis of SARS-CoV-2 by FDA under an Emergency Use Authorization (EUA). This EUA will remain in effect (meaning this test can be used) for the duration of the COVID-19 declaration under Section 564(b)(1) of the Act, 21 U.S.C. section 360bbb-3(b)(1), unless the authorization is terminated or revoked.  Performed at [facility], [address] [place], [place] [address]      Radiological Exams on Admission: CT Angio Chest PE W and/or Wo Contrast  Result Date: 08/19/2020 CLINICAL DATA:  65 year old male with progressive shortness of breath over the last few days. No improvement with breathing treatments. EXAM: CT ANGIOGRAPHY CHEST WITH CONTRAST TECHNIQUE: Multidetector CT imaging of the chest was performed using the standard protocol during bolus administration of intravenous contrast. Multiplanar CT image reconstructions and MIPs were obtained to evaluate the vascular anatomy. CONTRAST:  65m OMNIPAQUE IOHEXOL 350 MG/ML SOLN COMPARISON:  Portable chest 0750 hours today. CTA chest 05/30/2017. two-view chest radiographs 03/30/2019. FINDINGS: Cardiovascular: Adequate contrast bolus timing in the pulmonary arterial tree. No focal filling defect identified in the pulmonary arteries to suggest acute pulmonary embolism. Calcified aortic atherosclerosis. No cardiomegaly or pericardial effusion. Mediastinum/Nodes: Stable small mediastinal lymph nodes. Stable hilar nodes. No  lymphadenopathy. Lungs/Pleura: Emphysema. Round and spiculated posterior right upper lobe lung nodule has increased from roughly 10 mm in 2019 2 up to 23 mm long axis now (17 mm on series 7, image 21). And there is an adjacent small new 6 mm nodule in an area of chronic emphysema change on series 7, image 19. Stable small spiculated right upper lobe area on series 7, image 32 suggesting benign etiology there. And stable vague nodularity in the left lower lobe on image 76. No pleural effusion or acute infectious opacity identified. Upper Abdomen: Negative visible liver, spleen, pancreas, adrenal glands, kidneys and bowel in the upper abdomen. Musculoskeletal: Mild to moderate T8 superior endplate compression fracture  is new since 2019. This was mild in 2020. No retropulsion or complicating features. Other visible osseous structures appear stable. Review of the MIP images confirms the above findings. IMPRESSION: 1. Negative for acute pulmonary embolus. 2. Emphysema (ICD10-J43.9) with spiculated and enlarging right upper lobe mass (series 7, image 21) most compatible with BRONCHOGENIC CARCINOMA. Recommend referral to [facility]). 3. No other acute pulmonary finding. Several additional small lung nodules are stable since 2019 and benign (images 32 and 76). 4. Chronic T8 compression fracture. Aortic Atherosclerosis (ICD10-I70.0). Electronically Signed   By: Genevie Ann M.D.   On: 08/19/2020 09:58   DG Chest Portable 1 View  Result Date: 08/19/2020 CLINICAL DATA:  Shortness of breath EXAM: PORTABLE CHEST 1 VIEW COMPARISON:  March 30, 2019 FINDINGS: There is persistent interstitial thickening. Lungs are mildly hyperexpanded, stable. There is no edema or airspace opacity. Heart size and pulmonary vascularity are normal. No adenopathy. Evidence of prior trauma involving the left clavicle. IMPRESSION: Suspect a degree of underlying chronic bronchitis. No edema or airspace opacity.  Stable cardiac silhouette. Electronically Signed   By: Lowella Grip III M.D.   On: 08/19/2020 08:19     EKG: I have personally reviewed.  Sinus rhythm, QTC 433, LAE, RAD  Assessment/Plan Principal Problem:   COPD exacerbation (HCC) Active Problems:   Protein-calorie malnutrition, severe   HTN (hypertension)   HLD (hyperlipidemia)   Stroke (HCC)   GERD (gastroesophageal reflux disease)   Chronic diastolic CHF (congestive heart failure) (HCC)   Tobacco abuse   Acute on chronic respiratory failure with hypoxia (HCC)   Depression with anxiety   Lung cancer (HCC)   Sepsis ([place])   Acute on chronic respiratory failure with hypoxia and sepsis due to COPD exacerbation ([place]): CTA negative for PE.  Patient meets criteria for sepsis with tachycardia with heart rate of 103, tachypnea with RR 32.  Lactic acid normal.  Currently hemodynamically stable  -will admit to progressive unit as inpatient -BiPAP --> wean off  -Bronchodilators -Solu-Medrol 60 mg IV bid -Z pak  -Mucinex for cough  -Incentive spirometry -Follow up blood culture x2, sputum culture -Nasal cannula oxygen as needed to maintain O2 saturation 93% or greater -will get Procalcitonin -Follow-up 2D lower extremity Doppler to rule out DVT due to positive D-dimer  Depression and depression -Continue home medications  Protein-calorie malnutrition, severe: BMI 16.8 -Consult nutrition -Ensure  HTN (hypertension) -IV hydralazine as needed -Metoprolol, Cardizem -Patient is also on Lasix  HLD (hyperlipidemia) -Lipitor  Stroke ([place]) -Lipitor  GERD (gastroesophageal reflux disease) -Protonix  Chronic diastolic CHF (congestive heart failure) ([place]): 2D echo on 08/31/2016 showed EF 65 to 70% with grade 1 diastolic dysfunction.  BNP 69.1.  CHF seem to be compensated. -Continue home Lasix  Tobacco abuse -Nicotine patch  Lung cancer: Patient has history of normal lung cancer, but he seemed to have never been  treated. -Consulted NP, Faythe Casa of oncology  Possible A. fib: Patient is taking digoxin, Cardizem and metoprolol he is not sure if he has A. fib or not. -Continue digoxin, Cardizem and metoprolol    DVT ppx: SQ Lovenox Code Status: Full code Family Communication: not done, no family member is at bed side.   Disposition Plan:  Anticipate discharge back to previous environment Consults called:  NP, Faythe Casa of oncology is consulted. Admission status and Level of care: Progressive Cardiac:   as inpt      Status is: Inpatient  Remains inpatient appropriate because:Inpatient level  of care appropriate due to severity of illness   Dispo: The patient is from: Home              Anticipated d/c is to: Home              Patient currently is not medically stable to d/c.   Difficult to place patient No           Date of Service 08/19/2020    Ivor Costa [facility]   If 7PM-7AM, please contact night-coverage [email or web address] 08/19/2020, 8:23 PM

## 2020-08-19 NOTE — Consult Note (Addendum)
[place]  [phone] [phone]  ID: Ruben [name] OB: 04-20-1955  MR#: [id number]  QIO#:[id number]  Patient Care Team: Ranae Plumber, [place] as PCP - General (Family Medicine)  Reason for Cosult: Lung Mass  PMH: [name] is a 65 year old male with past medical history significant for esophageal stricture, hypertension, hyperlipidemia, COPD, stroke, GERD, depression with anxiety, CAD, history of tobacco abuse, atrial fibrillation who presents to the emergency room today for shortness of breath and chest tightness.  While in the emergency room, he received 2 duo nebs and Solu-Medrol IV with improvement of his oxygen saturations and breathing.  He was placed on BiPAP and SPO2 improved from 88% to 95%.  Lab work was fairly benign and COVID and flu were negative.  D-dimer slightly elevated prompting a CTA which showed emphysema with spiculated and enlarging right upper lobe mass most compatible with bronchogenic carcinoma.  Reviewed CTA from 05/30/2017 showed reticulonodular airspace disease in the right middle lobe thought to be secondary to inflammatory or infectious etiology or atypical infection such as MAI.  No evidence of lung cancer.  He had several chest x-rays completed in the interim.   He is followed by palliative care, [facility] outpatient for COPD but they have not been able to get a hold of him in quite some time.   He was seen back in March 2018 for shortness of breath and CT chest showed a suspicious spiculated nodule in the right lung apex measuring 1.7 cm. It was recommended he have a PET scan but he never followed up.  INTERVAL HISTORY: States his breathing has improved with nebulizers and IV steroids. He is feeling better on 2 liters.oxygen. He is resting comfortable in his room. He ate dinner without difficulty.   He was told he has lung cancer many years ago but never followed up with his doctors as recommended.   He is  scared and " does not want to die". He would like work up for lung cancer. He is weak and feels "worn out".   REVIEW OF SYSTEMS:   Review of Systems  Constitutional: Negative.  Negative for chills, fever, malaise/fatigue and weight loss.  HENT: Negative for congestion, ear pain and tinnitus.   Eyes: Negative.  Negative for blurred vision and double vision.  Respiratory: Positive for shortness of breath. Negative for cough and sputum production.   Cardiovascular: Negative.  Negative for chest pain, palpitations and leg swelling.  Gastrointestinal: Negative.  Negative for abdominal pain, constipation, diarrhea, nausea and vomiting.  Genitourinary: Negative for dysuria, frequency and urgency.  Musculoskeletal: Negative for back pain and falls.  Skin: Negative.  Negative for rash.  Neurological: Positive for weakness. Negative for headaches.  Endo/Heme/Allergies: Negative.  Does not bruise/bleed easily.  Psychiatric/Behavioral: Positive for depression. The patient is nervous/anxious. The patient does not have insomnia.     As per HPI. Otherwise, a complete review of systems is negative.  PAST MEDICAL HISTORY: Past Medical History:  Diagnosis Date  . Cancer ([place])    lung cancer  . CHF (congestive heart failure) ([place])   . COPD (chronic obstructive pulmonary disease) ([place])   . Coronary artery disease   . Depression   . Food impaction of esophagus   . Food impaction of esophagus   . Food impaction of esophagus   . Hypertension   . Myocardial infarction ([place])   . Stroke [facility])     PAST SURGICAL HISTORY: Past Surgical History:  Procedure Laterality Date  .  CARDIAC CATHETERIZATION    . COLONOSCOPY    . ELBOW SURGERY Left   . ESOPHAGOGASTRODUODENOSCOPY N/A 01/31/2019   Procedure: ESOPHAGOGASTRODUODENOSCOPY (EGD);  Surgeon: Toledo, Benay Pike, MD;  Location: [facility] ENDOSCOPY;  Service: Gastroenterology;  Laterality: N/A;  . ESOPHAGOGASTRODUODENOSCOPY (EGD) WITH PROPOFOL N/A 03/30/2019    Procedure: ESOPHAGOGASTRODUODENOSCOPY (EGD) WITH PROPOFOL;  Surgeon: Lin Landsman, MD;  Location: [facility] ENDOSCOPY;  Service: Gastroenterology;  Laterality: N/A;  . ESOPHAGOGASTRODUODENOSCOPY (EGD) WITH PROPOFOL N/A 02/22/2020   Procedure: ESOPHAGOGASTRODUODENOSCOPY (EGD) WITH PROPOFOL;  Surgeon: Jonathon Bellows, MD;  Location: [facility] ENDOSCOPY;  Service: Gastroenterology;  Laterality: N/A;  . FLEXIBLE BRONCHOSCOPY Bilateral 08/27/2016   Procedure: FLEXIBLE BRONCHOSCOPY;  Surgeon: Allyne Gee, MD;  Location: [facility] ORS;  Service: Pulmonary;  Laterality: Bilateral;  . WRIST SURGERY Left     FAMILY HISTORY: Family History  Problem Relation Age of Onset  . Heart disease Other   . Hypertension Other   . Diabetes Other   . CAD Mother     ADVANCED DIRECTIVES (Y/N):  _0 @  HEALTH MAINTENANCE: Social History   Tobacco Use  . Smoking status: Current Every Day Smoker    Packs/day: 1.00    Years: 43.00    Pack years: 43.00    Types: Cigarettes  . Smokeless tobacco: Never Used  Vaping Use  . Vaping Use: Never used  Substance Use Topics  . Alcohol use: No  . Drug use: Yes    Frequency: 4.0 times per week    Types: Marijuana     Colonoscopy:  PAP:  Bone density:  Lipid panel:  No Known Allergies  Current Facility-Administered Medications  Medication Dose Route Frequency Provider Last Rate Last Admin  . acetaminophen (TYLENOL) tablet 650 mg  650 mg Oral Q6H PRN Ivor Costa, MD      . albuterol (PROVENTIL) (2.5 MG/3ML) 0.083% nebulizer solution 2.5 mg  2.5 mg Nebulization Q4H PRN Ivor Costa, MD      . Derrill Memo ON 08/20/2020] atorvastatin (LIPITOR) tablet 20 mg  20 mg Oral QHS Ivor Costa, MD      . Derrill Memo ON 08/20/2020] azithromycin (ZITHROMAX) tablet 250 mg  250 mg Oral Daily Ivor Costa, MD      . dextromethorphan-guaiFENesin [facility] DM) 30-600 MG per 12 hr tablet 1 tablet  1 tablet Oral BID PRN Ivor Costa, MD      . diazepam (VALIUM) tablet 10 mg  10 mg Oral BID Ivor Costa, MD       . Derrill Memo ON 08/20/2020] digoxin (LANOXIN) tablet 0.25 mg  0.25 mg Oral Daily Ivor Costa, MD      . Derrill Memo ON 08/20/2020] diltiazem (CARDIZEM CD) 24 hr capsule 300 mg  300 mg Oral Daily Ivor Costa, MD      . enoxaparin (LOVENOX) injection 40 mg  40 mg Subcutaneous Q24H Ivor Costa, MD      . feeding supplement (ENSURE ENLIVE / ENSURE PLUS) liquid 237 mL  237 mL Oral BID BM Ivor Costa, MD   237 mL at 08/19/20 1048  . [START ON 08/20/2020] furosemide (LASIX) tablet 20 mg  20 mg Oral Daily Ivor Costa, MD      . hydrALAZINE (APRESOLINE) injection 5 mg  5 mg Intravenous Q2H PRN Ivor Costa, MD      . ipratropium-albuterol (DUONEB) 0.5-2.5 (3) MG/3ML nebulizer solution 3 mL  3 mL Nebulization Q4H Ivor Costa, MD   3 mL at 08/19/20 1523  . methylPREDNISolone sodium succinate (SOLU-MEDROL) 40 mg/mL injection 40 mg  40 mg Intravenous  BID Ivor Costa, MD   40 mg at 08/19/20 1641  . metoprolol tartrate (LOPRESSOR) tablet 25 mg  25 mg Oral BID Ivor Costa, MD      . nicotine (NICODERM CQ - dosed in mg/24 hours) patch 21 mg  21 mg Transdermal Daily Ivor Costa, MD   21 mg at 08/19/20 1047  . nitroGLYCERIN (NITROSTAT) SL tablet 0.4 mg  0.4 mg Sublingual Q5 min PRN Ivor Costa, MD      . ondansetron [facility]) injection 4 mg  4 mg Intravenous Q8H PRN Ivor Costa, MD      . pantoprazole (PROTONIX) EC tablet 40 mg  40 mg Oral BID Ivor Costa, MD        OBJECTIVE: Vitals:   08/19/20 1523 08/19/20 1547  BP:  127/73  Pulse:  89  Resp:  16  Temp:  98 F (36.7 C)  SpO2: 96% 98%     Body mass index is 16.83 kg/m.    ECOG FS:2 - Symptomatic, <50% confined to bed  Physical Exam Constitutional:      Appearance: Normal appearance.  HENT:     Head: Normocephalic and atraumatic.  Eyes:     Pupils: Pupils are equal, round, and reactive to light.  Cardiovascular:     Rate and Rhythm: Normal rate and regular rhythm.     Heart sounds: Normal heart sounds. No murmur heard.   Pulmonary:     Effort: Pulmonary effort is  normal.     Breath sounds: Decreased breath sounds present. No wheezing.  Abdominal:     General: Bowel sounds are normal. There is no distension.     Palpations: Abdomen is soft.     Tenderness: There is no abdominal tenderness.  Musculoskeletal:        General: Normal range of motion.     Cervical back: Normal range of motion.  Skin:    General: Skin is warm and dry.     Findings: No rash.  Neurological:     Mental Status: He is alert and oriented to person, place, and time.  Psychiatric:        Judgment: Judgment normal.     LAB RESULTS:  Lab Results  Component Value Date   NA 135 08/19/2020   K 3.6 08/19/2020   CL 97 (L) 08/19/2020   CO2 26 08/19/2020   GLUCOSE 109 (H) 08/19/2020   BUN 10 08/19/2020   CREATININE 0.65 08/19/2020   CALCIUM 9.3 08/19/2020   PROT 8.0 08/19/2020   ALBUMIN 3.9 08/19/2020   AST 13 (L) 08/19/2020   ALT 6 08/19/2020   ALKPHOS 69 08/19/2020   BILITOT 0.8 08/19/2020   GFRNONAA >60 08/19/2020   GFRAA >60 03/31/2019    Lab Results  Component Value Date   WBC 9.6 08/19/2020   NEUTROABS 7.4 08/19/2020   HGB 14.2 08/19/2020   HCT 42.9 08/19/2020   MCV 82.3 08/19/2020   PLT 281 08/19/2020     STUDIES: CT Angio Chest PE W and/or Wo Contrast  Result Date: 08/19/2020 CLINICAL DATA:  65 year old male with progressive shortness of breath over the last few days. No improvement with breathing treatments. EXAM: CT ANGIOGRAPHY CHEST WITH CONTRAST TECHNIQUE: Multidetector CT imaging of the chest was performed using the standard protocol during bolus administration of intravenous contrast. Multiplanar CT image reconstructions and MIPs were obtained to evaluate the vascular anatomy. CONTRAST:  44m OMNIPAQUE IOHEXOL 350 MG/ML SOLN COMPARISON:  Portable chest 0750 hours today. CTA chest 05/30/2017. two-view chest radiographs  03/30/2019. FINDINGS: Cardiovascular: Adequate contrast bolus timing in the pulmonary arterial tree. No focal filling defect  identified in the pulmonary arteries to suggest acute pulmonary embolism. Calcified aortic atherosclerosis. No cardiomegaly or pericardial effusion. Mediastinum/Nodes: Stable small mediastinal lymph nodes. Stable hilar nodes. No lymphadenopathy. Lungs/Pleura: Emphysema. Round and spiculated posterior right upper lobe lung nodule has increased from roughly 10 mm in 2019 2 up to 23 mm long axis now (17 mm on series 7, image 21). And there is an adjacent small new 6 mm nodule in an area of chronic emphysema change on series 7, image 19. Stable small spiculated right upper lobe area on series 7, image 32 suggesting benign etiology there. And stable vague nodularity in the left lower lobe on image 76. No pleural effusion or acute infectious opacity identified. Upper Abdomen: Negative visible liver, spleen, pancreas, adrenal glands, kidneys and bowel in the upper abdomen. Musculoskeletal: Mild to moderate T8 superior endplate compression fracture is new since 2019. This was mild in 2020. No retropulsion or complicating features. Other visible osseous structures appear stable. Review of the MIP images confirms the above findings. IMPRESSION: 1. Negative for acute pulmonary embolus. 2. Emphysema (ICD10-J43.9) with spiculated and enlarging right upper lobe mass (series 7, image 21) most compatible with BRONCHOGENIC CARCINOMA. Recommend referral to [facility]). 3. No other acute pulmonary finding. Several additional small lung nodules are stable since 2019 and benign (images 32 and 76). 4. Chronic T8 compression fracture. Aortic Atherosclerosis (ICD10-I70.0). Electronically Signed   By: Genevie Ann M.D.   On: 08/19/2020 09:58   DG Chest Portable 1 View  Result Date: 08/19/2020 CLINICAL DATA:  Shortness of breath EXAM: PORTABLE CHEST 1 VIEW COMPARISON:  March 30, 2019 FINDINGS: There is persistent interstitial thickening. Lungs are mildly hyperexpanded, stable. There is no edema or  airspace opacity. Heart size and pulmonary vascularity are normal. No adenopathy. Evidence of prior trauma involving the left clavicle. IMPRESSION: Suspect a degree of underlying chronic bronchitis. No edema or airspace opacity. Stable cardiac silhouette. Electronically Signed   By: Lowella Grip III M.D.   On: 08/19/2020 08:19    ASSESSMENT:   PLAN:    Right Lung Mass: CT angio from 08/19/2020 show spiculated and enlarging right upper lobe mass most compatible with bronchogenic carcinoma.   Reviewed imaging with supervising physician Dr. Grayland Ormond who recommends pulmonology consult to see if a bronchoscopy is feasible given his severe lung disease for tissue diagnosis. (unsure if this will be completed while inpatient).  He will need PET scan after discharge and evaluation at the cancer center to discuss staging and possible treatment options.    [facility], thoracic nurse navigator will arrange, tumor board discussion and outpatient follow up for the patient.     Disposition: Discharge once medically stable from oncology standpoint.    Will reach out to Dr. Patsey Berthold to take a look at Mr. Aggie Hacker imaging and offer recommendations.   Dr. Grayland Ormond involved in all aspects of patient care.   Greater than 50% was spent in counseling and coordination of care with this patient including but not limited to discussion of the relevant topics above (See A&P) including, but not limited to diagnosis and management of acute and chronic medical conditions.   Faythe Casa, NP 08/19/2020 8:28 PM  CC: Dr. Grayland Ormond  CC: Dr. Patsey Berthold

## 2020-08-19 NOTE — ED Notes (Signed)
Patient taken off Bi-pap per Blaine Hamper, MD. Patient on 2L [place] at 94%.

## 2020-08-19 NOTE — ED Notes (Signed)
Patient back in room form CT and on Bi-Pap.

## 2020-08-19 NOTE — ED Provider Notes (Signed)
[facility] Emergency Department Provider Note  ____________________________________________   Event Date/Time   First MD Initiated Contact with Patient 08/19/20 [phone]     (approximate)  I have reviewed the triage vital signs and the nursing notes.   HISTORY  Chief Complaint Shortness of Breath   HPI Ruben [name] is a 65 y.o. male with a past medical history of lung cancer, CHF, COPD, CAD, depression, HTN and CVA as well as HDL who presents for assessment of 2 to 3 days of worsening shortness of breath cough and chest tightness.  Further history on arrival is very limited secondary to patient's severe respiratory distress.  Per EMS he was not hypoxic but had an end-tidal in the 50s.  He was given 2 DuoNeb treatments and 125 mg of Solu-Medrol.          Past Medical History:  Diagnosis Date  . Cancer ([place])    lung cancer  . CHF (congestive heart failure) ([place])   . COPD (chronic obstructive pulmonary disease) ([place])   . Coronary artery disease   . Depression   . Food impaction of esophagus   . Food impaction of esophagus   . Food impaction of esophagus   . Hypertension   . Myocardial infarction ([place])   . Stroke [facility])     Patient Active Problem List   Diagnosis Date Noted  . Acute on chronic respiratory failure with hypoxia ([place]) 08/19/2020  . Depression with anxiety 08/19/2020  . HTN (hypertension) 03/30/2019  . HLD (hyperlipidemia) 03/30/2019  . Stroke ([place]) 03/30/2019  . GERD (gastroesophageal reflux disease) 03/30/2019  . CAD (coronary artery disease) 03/30/2019  . Chronic diastolic CHF (congestive heart failure) ([place]) 03/30/2019  . Tobacco abuse 03/30/2019  . Foreign body in esophagus   . Protein-calorie malnutrition, severe 02/02/2019  . Major depressive disorder, recurrent episode, moderate ([place])   . COPD (chronic obstructive pulmonary disease) ([place]) 05/29/2017  . Pneumonia 06/28/2015  . COPD with exacerbation ([place]) 09/25/2014  .  Depression 09/25/2014  . COPD exacerbation ([place]) 09/25/2014  . Severe recurrent major depression without psychotic features ([place]) 09/25/2014    Past Surgical History:  Procedure Laterality Date  . CARDIAC CATHETERIZATION    . COLONOSCOPY    . ELBOW SURGERY Left   . ESOPHAGOGASTRODUODENOSCOPY N/A 01/31/2019   Procedure: ESOPHAGOGASTRODUODENOSCOPY (EGD);  Surgeon: Toledo, Benay Pike, MD;  Location: [facility] ENDOSCOPY;  Service: Gastroenterology;  Laterality: N/A;  . ESOPHAGOGASTRODUODENOSCOPY (EGD) WITH PROPOFOL N/A 03/30/2019   Procedure: ESOPHAGOGASTRODUODENOSCOPY (EGD) WITH PROPOFOL;  Surgeon: Lin Landsman, MD;  Location: [facility] ENDOSCOPY;  Service: Gastroenterology;  Laterality: N/A;  . ESOPHAGOGASTRODUODENOSCOPY (EGD) WITH PROPOFOL N/A 02/22/2020   Procedure: ESOPHAGOGASTRODUODENOSCOPY (EGD) WITH PROPOFOL;  Surgeon: Jonathon Bellows, MD;  Location: [facility] ENDOSCOPY;  Service: Gastroenterology;  Laterality: N/A;  . FLEXIBLE BRONCHOSCOPY Bilateral 08/27/2016   Procedure: FLEXIBLE BRONCHOSCOPY;  Surgeon: Allyne Gee, MD;  Location: [facility] ORS;  Service: Pulmonary;  Laterality: Bilateral;  . WRIST SURGERY Left     Prior to Admission medications   Medication Sig Start Date End Date Taking? Authorizing Provider  albuterol (PROVENTIL HFA;VENTOLIN HFA) 108 (90 Base) MCG/ACT inhaler Inhale 2 puffs into the lungs every 6 (six) hours as needed for wheezing or shortness of breath. 02/19/18  Yes Epifanio Lesches, MD  atorvastatin (LIPITOR) 20 MG tablet Take 1 tablet by mouth at bedtime. 05/03/20  Yes [provider]  diazepam (VALIUM) 10 MG tablet Take 10 mg by mouth 2 (two) times daily. 07/28/20  Yes  [provider]  digoxin (LANOXIN) 0.25 MG tablet Take 1 tablet (0.25 mg total) by mouth daily. 02/19/18  Yes Epifanio Lesches, MD  diltiazem (CARDIZEM CD) 300 MG 24 hr capsule Take 1 capsule (300 mg total) by mouth daily. 02/03/19  Yes Ojie, Jude, MD  ipratropium-albuterol (DUONEB)  0.5-2.5 (3) MG/3ML SOLN Take 3 mLs by nebulization every 4 (four) hours as needed (shortness of breath).   Yes [provider]  metoprolol tartrate (LOPRESSOR) 25 MG tablet Take 1 tablet (25 mg total) by mouth 2 (two) times daily. 02/19/18  Yes Epifanio Lesches, MD  atorvastatin (LIPITOR) 10 MG tablet Take 1 tablet (10 mg total) by mouth daily. Patient not taking: No sig reported 02/20/18   Epifanio Lesches, MD  diazepam (VALIUM) 5 MG tablet Take 1 tablet (5 mg total) by mouth 2 (two) times daily. Do not drive or operate machinery while on medication. Patient not taking: No sig reported 02/03/19   Otila Back, MD  furosemide (LASIX) 20 MG tablet Take 1 tablet (20 mg total) by mouth daily. 04/28/18 04/28/19  Hillary Bow, MD  mometasone-formoterol (DULERA) 200-5 MCG/ACT AERO Inhale 2 puffs into the lungs 2 (two) times daily. Patient not taking: No sig reported 02/19/18   Epifanio Lesches, MD  Multiple Vitamin (MULTIVITAMIN WITH MINERALS) TABS tablet Take 1 tablet by mouth daily. Patient not taking: No sig reported 02/04/19   Stark Jock, Jude, MD  nicotine (NICODERM CQ - DOSED IN MG/24 HOURS) 21 mg/24hr patch Place 1 patch (21 mg total) onto the skin daily. 02/04/19   Stark Jock Jude, MD  omeprazole (PRILOSEC) 40 MG capsule Take 1 capsule (40 mg total) by mouth 2 (two) times daily before a meal. 03/30/19 04/29/19  Vanga, Tally Due, MD  pantoprazole (PROTONIX) 40 MG tablet Take 1 tablet (40 mg total) by mouth daily. Patient not taking: Reported on 08/19/2020 02/04/19   Otila Back, MD  senna-docusate (SENOKOT-S) 8.6-50 MG tablet Take 1 tablet by mouth at bedtime as needed for mild constipation. Patient not taking: No sig reported 02/03/19   Otila Back, MD  sertraline (ZOLOFT) 100 MG tablet Take 100 mg by mouth daily.  Patient not taking: No sig reported    [provider]    Allergies Patient has no known allergies.  Family History  Problem Relation Age of Onset  . Heart  disease Other   . Hypertension Other   . Diabetes Other   . CAD Mother     Social History Social History   Tobacco Use  . Smoking status: Current Every Day Smoker    Packs/day: 1.00    Years: 43.00    Pack years: 43.00    Types: Cigarettes  . Smokeless tobacco: Never Used  Vaping Use  . Vaping Use: Never used  Substance Use Topics  . Alcohol use: No  . Drug use: Yes    Frequency: 4.0 times per week    Types: Marijuana    Review of Systems  Review of Systems  Respiratory: Positive for cough and shortness of breath.   Cardiovascular: Positive for chest pain.      ____________________________________________   PHYSICAL EXAM:  VITAL SIGNS: ED Triage Vitals [08/19/20 0740]  Enc Vitals Group     BP      Pulse      Resp      Temp      Temp src      SpO2 (!) 88 %     Weight      Height  Head Circumference      Peak Flow      Pain Score      Pain Loc      Pain Edu?      Excl. in [place]?    Vitals:   08/19/20 0900 08/19/20 0930  BP: (!) 145/85 (!) 159/80  Pulse: 96 (!) 107  Resp: (!) 26 (!) 25  Temp:    SpO2: 97% 97%   Physical Exam Vitals and nursing note reviewed.  Constitutional:      General: He is in acute distress.     Appearance: He is well-developed. He is ill-appearing.  HENT:     Head: Normocephalic and atraumatic.     Right Ear: External ear normal.     Left Ear: External ear normal.     Nose: Nose normal.  Eyes:     Conjunctiva/sclera: Conjunctivae normal.  Cardiovascular:     Rate and Rhythm: Regular rhythm. Tachycardia present.     Heart sounds: No murmur heard.   Pulmonary:     Effort: Tachypnea, accessory muscle usage and respiratory distress present.     Breath sounds: Decreased breath sounds and wheezing present.  Abdominal:     Palpations: Abdomen is soft.     Tenderness: There is no abdominal tenderness.  Musculoskeletal:     Cervical back: Neck supple.  Skin:    General: Skin is warm and dry.     Capillary Refill:  Capillary refill takes less than 2 seconds.  Neurological:     Mental Status: He is alert and oriented to person, place, and time.  Psychiatric:        Mood and Affect: Mood normal.      ____________________________________________   LABS (all labs ordered are listed, but only abnormal results are displayed)  Labs Reviewed  BLOOD GAS, VENOUS - Abnormal; Notable for the following components:      Result Value   pO2, Ven 160.0 (*)    Acid-Base Excess 2.7 (*)    All other components within normal limits  CBC WITH DIFFERENTIAL/PLATELET - Abnormal; Notable for the following components:   Monocytes Absolute 1.2 (*)    All other components within normal limits  COMPREHENSIVE METABOLIC PANEL - Abnormal; Notable for the following components:   Chloride 97 (*)    Glucose, Bld 109 (*)    AST 13 (*)    All other components within normal limits  D-DIMER, QUANTITATIVE - Abnormal; Notable for the following components:   D-Dimer, Quant 0.70 (*)    All other components within normal limits  DIGOXIN LEVEL - Abnormal; Notable for the following components:   Digoxin Level <0.2 (*)    All other components within normal limits  RESP PANEL BY RT-PCR (FLU A&B, COVID) ARPGX2  CULTURE, BLOOD (ROUTINE X 2)  CULTURE, BLOOD (ROUTINE X 2)  PROCALCITONIN  MAGNESIUM  LACTIC ACID, PLASMA  BRAIN NATRIURETIC PEPTIDE  LACTIC ACID, PLASMA  TROPONIN I (HIGH SENSITIVITY)   ____________________________________________  EKG  Sinus tachycardia with ventricular rate of 101, normal axis, unremarkable intervals and nonspecific ST changes in lateral and inferior leads as well as in V3 without other evidence of acute ischemia or other significant underlying arrhythmia. ____________________________________________  RADIOLOGY  ED MD interpretation: No peripheral consolidation, effusion, thorax, overt edema or other clear acute thoracic process.  Evidence of chronic emphysema.  CTA chest shows evidence of  emphysema without evidence of PE.  There is also a right upper lobe mass concerning for bronchogenic carcinoma there is  also multiple small lung nodules noted.  Chronic appearing T8 compression fracture aortic atherosclerosis.   Official radiology report(s): CT Angio Chest PE W and/or Wo Contrast  Result Date: 08/19/2020 CLINICAL DATA:  65 year old male with progressive shortness of breath over the last few days. No improvement with breathing treatments. EXAM: CT ANGIOGRAPHY CHEST WITH CONTRAST TECHNIQUE: Multidetector CT imaging of the chest was performed using the standard protocol during bolus administration of intravenous contrast. Multiplanar CT image reconstructions and MIPs were obtained to evaluate the vascular anatomy. CONTRAST:  18m OMNIPAQUE IOHEXOL 350 MG/ML SOLN COMPARISON:  Portable chest 0750 hours today. CTA chest 05/30/2017. two-view chest radiographs 03/30/2019. FINDINGS: Cardiovascular: Adequate contrast bolus timing in the pulmonary arterial tree. No focal filling defect identified in the pulmonary arteries to suggest acute pulmonary embolism. Calcified aortic atherosclerosis. No cardiomegaly or pericardial effusion. Mediastinum/Nodes: Stable small mediastinal lymph nodes. Stable hilar nodes. No lymphadenopathy. Lungs/Pleura: Emphysema. Round and spiculated posterior right upper lobe lung nodule has increased from roughly 10 mm in 2019 2 up to 23 mm long axis now (17 mm on series 7, image 21). And there is an adjacent small new 6 mm nodule in an area of chronic emphysema change on series 7, image 19. Stable small spiculated right upper lobe area on series 7, image 32 suggesting benign etiology there. And stable vague nodularity in the left lower lobe on image 76. No pleural effusion or acute infectious opacity identified. Upper Abdomen: Negative visible liver, spleen, pancreas, adrenal glands, kidneys and bowel in the upper abdomen. Musculoskeletal: Mild to moderate T8 superior endplate  compression fracture is new since 2019. This was mild in 2020. No retropulsion or complicating features. Other visible osseous structures appear stable. Review of the MIP images confirms the above findings. IMPRESSION: 1. Negative for acute pulmonary embolus. 2. Emphysema (ICD10-J43.9) with spiculated and enlarging right upper lobe mass (series 7, image 21) most compatible with BRONCHOGENIC CARCINOMA. Recommend [facility]. 3. No other acute pulmonary finding. Several additional small lung nodules are stable since 2019 and benign (images 32 and 76). 4. Chronic T8 compression fracture. Aortic Atherosclerosis (ICD10-I70.0). Electronically Signed   By: HGenevie AnnM.D.   On: 08/19/2020 09:58   DG Chest Portable 1 View  Result Date: 08/19/2020 CLINICAL DATA:  Shortness of breath EXAM: PORTABLE CHEST 1 VIEW COMPARISON:  March 30, 2019 FINDINGS: There is persistent interstitial thickening. Lungs are mildly hyperexpanded, stable. There is no edema or airspace opacity. Heart size and pulmonary vascularity are normal. No adenopathy. Evidence of prior trauma involving the left clavicle. IMPRESSION: Suspect a degree of underlying chronic bronchitis. No edema or airspace opacity. Stable cardiac silhouette. Electronically Signed   By: WLowella GripIII M.D.   On: 08/19/2020 08:19    ____________________________________________   PROCEDURES  Procedure(s) performed (including Critical Care):  .Critical Care Performed by: SLucrezia Starch MD Authorized by: SLucrezia Starch MD   Critical care provider statement:    Critical care time (minutes):  45   Critical care time was exclusive of:  Separately billable procedures and treating other patients   Critical care was necessary to treat or prevent imminent or life-threatening deterioration of the following conditions:  Respiratory failure   Critical care was time spent personally by me on the following  activities:  Discussions with consultants, evaluation of patient's response to treatment, examination of patient, ordering and performing treatments and interventions, ordering and review of laboratory studies, ordering and review of radiographic studies, pulse  oximetry, re-evaluation of patient's condition, obtaining history from patient or surrogate and review of old charts     ____________________________________________   INITIAL IMPRESSION / ASSESSMENT AND PLAN / ED COURSE      Patient presents with above-stated history exam for couple days ago chest pain shortness of breath cough.  On arrival he is very dyspneic with significant accessory muscle use and very diminished breath sounds bilaterally some expiratory wheezes.  On arrival he is hypertensive with a BP of 189/89, tachycardic to 103, tachypneic at 32, afebrile with SPO2 of 88% improved to 95% on BiPAP.   Differential includes acute COPD exacerbation, pneumothorax, pneumonia, CHF exacerbation, ACS, arrhythmia, PE, colitis, peritonitis, metabolic derangements and anemia  After approximately 30 minutes on BiPAP and after receiving a DuoNeb treatment patient's states he feels much improved.  He does relate that he has not had any other significant symptoms including any diarrhea, urinary symptoms, Donnell pain, back pain, rash or extremity pain headache earache sore throat or runny nose.  States he does have a little discomfort in his upper back.  States he is still smoking but trying to cut down on this and otherwise has been taking his medicines.  CBC shows no leukocytosis or acute anemia.  CMP shows no significant electrolyte or metabolic derangements.  Troponin is nonelevated and given relatively reassuring EKG suspicion for ACS although there is some nonspecific changes of obtain a delta troponin.  Magnesium is WNL.  Lactic is nonelevated.  Do not believe patient is septic as he has no fevers or leukocytosis or hypotension.  COVID and  flu is negative.  BNP is not elevated given absence of edema on chest x-ray exam suspicion for acute heart failure at this time.  D-dimer is elevated and thus will obtain CTA chest to rule out PE.  Suspect symptoms likely secondary to acute COPD exacerbation.  Patient did state he felt much better on reassessment after DuoNeb therapy and on BiPAP.   CTA shows no evidence of PE pneumonia but does show evidence of aortic atherosclerosis lung nodules emphysema and likely bronchogenic carcinoma in the right upper lobe.  Discussed this finding with patient who is now aware he likely has lung cancer.    Plan to admit to medicine service for further evaluation and management.   ____________________________________________   FINAL CLINICAL IMPRESSION(S) / ED DIAGNOSES  Final diagnoses:  COPD exacerbation ([place])  Acute respiratory failure with hypoxia (H[facility])  Positive D dimer  Aortic atherosclerosis ([facility])  Malignant neoplasm of upper lobe of right lung (H[facility])    Medications  nitroGLYCERIN (NITROSTAT) SL tablet 0.4 mg (has no administration in time range)  ipratropium-albuterol (DUONEB) 0.5-2.5 (3) MG/3ML nebulizer solution 3 mL (has no administration in time range)  dextromethorphan-guaiFENesin (MUCINEX DM) 30-600 MG per 12 hr tablet 1 tablet (has no administration in time range)  albuterol (PROVENTIL) (2.5 MG/3ML) 0.083% nebulizer solution 2.5 mg (has no administration in time range)  methylPREDNISolone sodium succinate (SOLU-MEDROL) 40 mg/mL injection 40 mg (has no administration in time range)  azithromycin (ZITHROMAX) tablet 500 mg (has no administration in time range)    Followed by  azithromycin (ZITHROMAX) tablet 250 mg (has no administration in time range)  nicotine (NICODERM CQ - dosed in mg/24 hours) patch 21 mg (has no administration in time range)  ondansetron (ZOFRAN) injection 4 mg (has no administration in time range)  acetaminophen (TYLENOL) tablet 650 mg (has no administration in  time range)  hydrALAZINE (APRESOLINE) injection 5 mg (has no administration  in time range)  feeding supplement (ENSURE ENLIVE / ENSURE PLUS) liquid 237 mL (has no administration in time range)  ipratropium-albuterol (DUONEB) 0.5-2.5 (3) MG/3ML nebulizer solution 9 mL (9 mLs Nebulization Given 08/19/20 0756)  iohexol (OMNIPAQUE) 350 MG/ML injection 75 mL (75 mLs Intravenous Contrast Given 08/19/20 0915)     ED Discharge Orders    None       Note:  This document was prepared using Dragon voice recognition software and may include unintentional dictation errors.   Lucrezia Starch, MD 08/19/20 1021

## 2020-08-20 ENCOUNTER — Inpatient Hospital Stay: Payer: Medicaid Other

## 2020-08-20 ENCOUNTER — Encounter: Payer: Self-pay | Admitting: Internal Medicine

## 2020-08-20 DIAGNOSIS — Z515 Encounter for palliative care: Secondary | ICD-10-CM

## 2020-08-20 DIAGNOSIS — Z7189 Other specified counseling: Secondary | ICD-10-CM

## 2020-08-20 DIAGNOSIS — C3431 Malignant neoplasm of lower lobe, right bronchus or lung: Secondary | ICD-10-CM

## 2020-08-20 LAB — CBC
HCT: 38.3 % — ABNORMAL LOW (ref 39.0–52.0)
Hemoglobin: 13 g/dL (ref 13.0–17.0)
MCH: 27.7 pg (ref 26.0–34.0)
MCHC: 33.9 g/dL (ref 30.0–36.0)
MCV: 81.7 fL (ref 80.0–100.0)
Platelets: 258 10*3/uL (ref 150–400)
RBC: 4.69 MIL/uL (ref 4.22–5.81)
RDW: 13.4 % (ref 11.5–15.5)
WBC: 9.9 10*3/uL (ref 4.0–10.5)
nRBC: 0 % (ref 0.0–0.2)

## 2020-08-20 LAB — BASIC METABOLIC PANEL
Anion gap: 8 (ref 5–15)
BUN: 17 mg/dL (ref 8–23)
CO2: 27 mmol/L (ref 22–32)
Calcium: 9.3 mg/dL (ref 8.9–10.3)
Chloride: 97 mmol/L — ABNORMAL LOW (ref 98–111)
Creatinine, Ser: 0.63 mg/dL (ref 0.61–1.24)
GFR, Estimated: >60 mL/min (ref >60)
Glucose, Bld: 164 mg/dL — ABNORMAL HIGH (ref 70–99)
Potassium: 4.8 mmol/L (ref 3.5–5.1)
Sodium: 132 mmol/L — ABNORMAL LOW (ref 135–145)

## 2020-08-20 LAB — HIV ANTIBODY (ROUTINE TESTING W REFLEX): HIV Screen 4th Generation wRfx: NONREACTIVE

## 2020-08-20 MED ORDER — ENSURE ENLIVE PO LIQD
237.0000 mL | Freq: Three times a day (TID) | ORAL | Status: DC
  Administered 2020-08-20 – 2020-08-25 (×7): 237 mL via ORAL

## 2020-08-20 MED ORDER — ADULT MULTIVITAMIN W/MINERALS CH
1.0000 | ORAL_TABLET | Freq: Every day | ORAL | Status: DC
  Administered 2020-08-21 – 2020-08-25 (×5): 1 via ORAL
  Filled 2020-08-20 (×5): qty 1

## 2020-08-20 NOTE — Consult Note (Signed)
Reason for Consult: Lung mass Referring Physician: Max Sane MD  Ruben [name] is an 65 y.o. male.  HPI:  Patient is a 65 year old current smoker (1 PPD +) whom we are asked to evaluate for a right upper lobe mass and potential for bronchoscopy.  He was admitted on 10 May with increasing shortness of breath that had been going on for 3 days prior to admission.  This had been progressively worsening.  The patient was noted to be in respiratory distress when he was admitted with oxygen saturations of 88% on room air he was placed on BiPAP at that time.  He was admitted with COPD exacerbation.  The patient is not a very eloquent historian and states currently that he feels better after having had nebulization treatments but feels that oxygen is what makes him feel the best.  He does not endorse any other symptomatology except feeling weak and still very short of breath.  He has not had any hemoptysis.  He had a positive D-dimer noted in the emergency room and this prompted a CT angio chest.  This revealed severe pulmonary emphysema and a right upper lobe mass.  Of note in 2018 he was evaluated by Dr. Humphrey Rolls for possible postobstructive pneumonia and need for bronchoscopy.  The patient at that time was not stable for bronchoscopy and this had to be canceled.  He was scheduled to see Dr. Humphrey Rolls as an outpatient however he did not follow-up.  He was then evaluated by Dr. Ashby Dawes, Dr. Stoney Bang and Dr. Alva Garnet all of these times the patient neglected to follow-up.  On Dr. Alva Garnet evaluation of the patient of 30 Aug 2016 a right apical spiculated nodule was described and he was set to follow-up with Dr. Humphrey Rolls and potential advanced bronchoscopy with EBUS by Dr. Mortimer Fries.  He neglected to follow-up at that time.  The patient stated that he had not followed up due to concerns for cost of his care.   Past Medical History:  Diagnosis Date  . Cancer ([place])    lung cancer  . CHF (congestive heart failure) ([place])   . COPD  (chronic obstructive pulmonary disease) ([place])   . Coronary artery disease   . Depression   . Food impaction of esophagus   . Food impaction of esophagus   . Food impaction of esophagus   . Hypertension   . Myocardial infarction ([place])   . Stroke [facility])     Past Surgical History:  Procedure Laterality Date  . CARDIAC CATHETERIZATION    . COLONOSCOPY    . ELBOW SURGERY Left   . ESOPHAGOGASTRODUODENOSCOPY N/A 01/31/2019   Procedure: ESOPHAGOGASTRODUODENOSCOPY (EGD);  Surgeon: Toledo, Benay Pike, MD;  Location: [facility] ENDOSCOPY;  Service: Gastroenterology;  Laterality: N/A;  . ESOPHAGOGASTRODUODENOSCOPY (EGD) WITH PROPOFOL N/A 03/30/2019   Procedure: ESOPHAGOGASTRODUODENOSCOPY (EGD) WITH PROPOFOL;  Surgeon: Lin Landsman, MD;  Location: [facility] ENDOSCOPY;  Service: Gastroenterology;  Laterality: N/A;  . ESOPHAGOGASTRODUODENOSCOPY (EGD) WITH PROPOFOL N/A 02/22/2020   Procedure: ESOPHAGOGASTRODUODENOSCOPY (EGD) WITH PROPOFOL;  Surgeon: Jonathon Bellows, MD;  Location: [facility] ENDOSCOPY;  Service: Gastroenterology;  Laterality: N/A;  . FLEXIBLE BRONCHOSCOPY Bilateral 08/27/2016   Procedure: FLEXIBLE BRONCHOSCOPY;  Surgeon: Allyne Gee, MD;  Location: [facility] ORS;  Service: Pulmonary;  Laterality: Bilateral;  . WRIST SURGERY Left     Family History  Problem Relation Age of Onset  . Heart disease Other   . Hypertension Other   . Diabetes Other   . CAD Mother     Social  History:  reports that he has been smoking cigarettes. He has a 43.00 pack-year smoking history. He has never used smokeless tobacco. He reports current drug use. Frequency: 4.00 times per week. Drug: Marijuana. He reports that he does not drink alcohol.  Allergies: No Known Allergies  Medications: I have reviewed the patient's current medications.  Results for orders placed or performed during the hospital encounter of 08/19/20 (from the past 48 hour(s))  Blood gas, venous     Status: Abnormal   Collection Time: 08/19/20  7:41  AM  Result Value Ref Range   pH, Ven 7.41 7.250 - 7.430   pCO2, Ven 44 44.0 - 60.0 mmHg   pO2, Ven 160.0 (H) 32.0 - 45.0 mmHg   Bicarbonate 27.9 20.0 - 28.0 mmol/L   Acid-Base Excess 2.7 (H) 0.0 - 2.0 mmol/L   O2 Saturation 99.4 %   Patient temperature 37.0    Sample type VENOUS     Comment: Performed at [facility], [address] [place], [place] [address]  CBC with Differential     Status: Abnormal   Collection Time: 08/19/20  7:45 AM  Result Value Ref Range   WBC 9.6 4.0 - 10.5 K/uL   RBC 5.21 4.22 - 5.81 MIL/uL   Hemoglobin 14.2 13.0 - 17.0 g/dL   HCT 42.9 39.0 - 52.0 %   MCV 82.3 80.0 - 100.0 fL   MCH 27.3 26.0 - 34.0 pg   MCHC 33.1 30.0 - 36.0 g/dL   RDW 13.7 11.5 - 15.5 %   Platelets 281 150 - 400 K/uL   nRBC 0.0 0.0 - 0.2 %   Neutrophils Relative % 77 %   Neutro Abs 7.4 1.7 - 7.7 K/uL   Lymphocytes Relative 9 %   Lymphs Abs 0.9 0.7 - 4.0 K/uL   Monocytes Relative 13 %   Monocytes Absolute 1.2 (H) 0.1 - 1.0 K/uL   Eosinophils Relative 0 %   Eosinophils Absolute 0.0 0.0 - 0.5 K/uL   Basophils Relative 1 %   Basophils Absolute 0.1 0.0 - 0.1 K/uL   Immature Granulocytes 0 %   Abs Immature Granulocytes 0.02 0.00 - 0.07 K/uL    Comment: Performed at [facility], [address] [place], [place] [address]  Comprehensive metabolic panel     Status: Abnormal   Collection Time: 08/19/20  7:45 AM  Result Value Ref Range   Sodium 135 135 - 145 mmol/L   Potassium 3.6 3.5 - 5.1 mmol/L   Chloride 97 (L) 98 - 111 mmol/L   CO2 26 22 - 32 mmol/L   Glucose, Bld 109 (H) 70 - 99 mg/dL    Comment: Glucose reference range applies only to samples taken after fasting for at least 8 hours.   BUN 10 8 - 23 mg/dL   Creatinine, Ser 0.65 0.61 - 1.24 mg/dL   Calcium 9.3 8.9 - 10.3 mg/dL   Total Protein 8.0 6.5 - 8.1 g/dL   Albumin 3.9 3.5 - 5.0 g/dL   AST 13 (L) 15 - 41 U/L   ALT 6 0 - 44 U/L   Alkaline Phosphatase 69 38 - 126 U/L   Total Bilirubin 0.8 0.3 - 1.2  mg/dL   GFR, Estimated >60 >60 mL/min    Comment: (NOTE) Calculated using the CKD-EPI Creatinine Equation (2021)    Anion gap 12 5 - 15    Comment: Performed at [facility], [address] [place],  [address]  Troponin I (High  Sensitivity)     Status: None   Collection Time: 08/19/20  7:45 AM  Result Value Ref Range   Troponin I (High Sensitivity) 7 <18 ng/L    Comment: (NOTE) Elevated high sensitivity troponin I (hsTnI) values and significant  changes across serial measurements may suggest ACS but many other  chronic and acute conditions are known to elevate hsTnI results.  Refer to the "Links" section for chest pain algorithms and additional  guidance. Performed at [facility], [place], [place] [address]   Procalcitonin - Baseline     Status: None   Collection Time: 08/19/20  7:45 AM  Result Value Ref Range   Procalcitonin <0.10 ng/mL    Comment:        Interpretation: PCT (Procalcitonin) <= 0.5 ng/mL: Systemic infection (sepsis) is not likely. Local bacterial infection is possible. (NOTE)       Sepsis PCT Algorithm           Lower Respiratory Tract                                      Infection PCT Algorithm    ----------------------------     ----------------------------         PCT < 0.25 ng/mL                PCT < 0.10 ng/mL          Strongly encourage             Strongly discourage   discontinuation of antibiotics    initiation of antibiotics    ----------------------------     -----------------------------       PCT 0.25 - 0.50 ng/mL            PCT 0.10 - 0.25 ng/mL               OR       >80% decrease in PCT            Discourage initiation of                                            antibiotics      Encourage discontinuation           of antibiotics    ----------------------------     -----------------------------         PCT >= 0.50 ng/mL              PCT 0.26 - 0.50 ng/mL               AND        <80% decrease  in PCT             Encourage initiation of                                             antibiotics       Encourage continuation           of antibiotics    ----------------------------     -----------------------------        PCT >= 0.50 ng/mL  PCT > 0.50 ng/mL               AND         increase in PCT                  Strongly encourage                                      initiation of antibiotics    Strongly encourage escalation           of antibiotics                                     -----------------------------                                           PCT <= 0.25 ng/mL                                                 OR                                        > 80% decrease in PCT                                      Discontinue / Do not initiate                                             antibiotics  Performed at [facility], [address] [place], [place] [address]   Magnesium     Status: None   Collection Time: 08/19/20  7:45 AM  Result Value Ref Range   Magnesium 1.9 1.7 - 2.4 mg/dL    Comment: Performed at [facility], [address] [place], [place] [address]  Resp Panel by RT-PCR (Flu A&B, Covid) Nasopharyngeal Swab     Status: None   Collection Time: 08/19/20  7:45 AM   Specimen: Nasopharyngeal Swab; Nasopharyngeal(NP) swabs in vial transport medium  Result Value Ref Range   SARS Coronavirus 2 by RT PCR NEGATIVE NEGATIVE    Comment: (NOTE) SARS-CoV-2 target nucleic acids are NOT DETECTED.  The SARS-CoV-2 RNA is generally detectable in upper respiratory specimens during the acute phase of infection. The lowest concentration of SARS-CoV-2 viral copies this assay can detect is 138 copies/mL. A negative result does not preclude SARS-Cov-2 infection and should not be used as the sole basis for treatment or other patient management decisions. A negative result may occur with  improper specimen collection/handling,  submission of specimen other than nasopharyngeal swab, presence of viral mutation(s) within the areas targeted by this assay, and inadequate number of viral copies(<138 copies/mL). A negative result must be combined with clinical observations, patient history, and epidemiological information. The expected result is  Negative.  Fact Sheet for Patients:  EntrepreneurPulse.com.au  Fact Sheet for Healthcare Providers:  IncredibleEmployment.be  This test is no t yet approved or cleared by the [place] FDA and  has been authorized for detection and/or diagnosis of SARS-CoV-2 by FDA under an Emergency Use Authorization (EUA). This EUA will remain  in effect (meaning this test can be used) for the duration of the COVID-19 declaration under Section 564(b)(1) of the Act, 21 U.S.C.section 360bbb-3(b)(1), unless the authorization is terminated  or revoked sooner.       Influenza A by PCR NEGATIVE NEGATIVE   Influenza B by PCR NEGATIVE NEGATIVE    Comment: (NOTE) The Xpert Xpress SARS-CoV-2/FLU/RSV plus assay is intended as an aid in the diagnosis of influenza from Nasopharyngeal swab specimens and should not be used as a sole basis for treatment. Nasal washings and aspirates are unacceptable for Xpert Xpress SARS-CoV-2/FLU/RSV testing.  Fact Sheet for Patients: EntrepreneurPulse.com.au  Fact Sheet for Healthcare Providers: IncredibleEmployment.be  This test is not yet approved or cleared by the [place] FDA and has been authorized for detection and/or diagnosis of SARS-CoV-2 by FDA under an Emergency Use Authorization (EUA). This EUA will remain in effect (meaning this test can be used) for the duration of the COVID-19 declaration under Section 564(b)(1) of the Act, 21 U.S.C. section 360bbb-3(b)(1), unless the authorization is terminated or revoked.  Performed at [facility], [address] [place], [place] [address]   Lactic acid, plasma     Status: None   Collection Time: 08/19/20  7:45 AM  Result Value Ref Range   Lactic Acid, Venous 1.3 0.5 - 1.9 mmol/L    Comment: Performed at [facility], [address] [place], [place] [address]  Blood culture (routine x 2)     Status: None (Preliminary result)   Collection Time: 08/19/20  7:45 AM   Specimen: BLOOD  Result Value Ref Range   Specimen Description BLOOD LEFT ANTECUBITAL    Special Requests      BOTTLES DRAWN AEROBIC AND ANAEROBIC Blood Culture adequate volume   Culture      NO GROWTH < 24 HOURS Performed at [facility], [address] [place], [place] [address]    Report Status PENDING   Blood culture (routine x 2)     Status: None (Preliminary result)   Collection Time: 08/19/20  7:45 AM   Specimen: BLOOD  Result Value Ref Range   Specimen Description BLOOD BLOOD LEFT ARM    Special Requests      BOTTLES DRAWN AEROBIC AND ANAEROBIC Blood Culture adequate volume   Culture      NO GROWTH < 24 HOURS Performed at [facility], [address] [place], [place] [address]    Report Status PENDING   Brain natriuretic peptide     Status: None   Collection Time: 08/19/20  7:45 AM  Result Value Ref Range   B Natriuretic Peptide 69.6 0.0 - 100.0 pg/mL    Comment: Performed at [facility], [address] [place], [place] [address]  D-dimer, quantitative     Status: Abnormal   Collection Time: 08/19/20  7:53 AM  Result Value Ref Range   D-Dimer, Quant 0.70 (H) 0.00 - 0.50 ug/mL-FEU    Comment: (NOTE) At the manufacturer cut-off value of 0.5 g/mL FEU, this assay has a negative predictive value of 95-100%.This assay is intended for use in conjunction with a clinical pretest probability (PTP) assessment model to exclude  pulmonary embolism (PE) and deep venous thrombosis (DVT) in outpatients suspected of PE or DVT. Results should be correlated with clinical  presentation. Performed at [facility], [address] [place], [place] [address]   Digoxin level     Status: Abnormal   Collection Time: 08/19/20  7:53 AM  Result Value Ref Range   Digoxin Level <0.2 (L) 0.8 - 2.0 ng/mL    Comment: Performed at [facility], [address] [place], [place] [address]  Lactic acid, plasma     Status: None   Collection Time: 08/19/20 10:37 AM  Result Value Ref Range   Lactic Acid, Venous 1.0 0.5 - 1.9 mmol/L    Comment: Performed at [facility], [address] [place], [place] [address]  HIV Antibody (routine testing w rflx)     Status: None   Collection Time: 08/20/20  3:12 AM  Result Value Ref Range   HIV Screen 4th Generation wRfx Non Reactive Non Reactive    Comment: Performed at [facility], [place] [address], [place], [place] [address]  Basic metabolic panel     Status: Abnormal   Collection Time: 08/20/20  3:12 AM  Result Value Ref Range   Sodium 132 (L) 135 - 145 mmol/L   Potassium 4.8 3.5 - 5.1 mmol/L   Chloride 97 (L) 98 - 111 mmol/L   CO2 27 22 - 32 mmol/L   Glucose, Bld 164 (H) 70 - 99 mg/dL    Comment: Glucose reference range applies only to samples taken after fasting for at least 8 hours.   BUN 17 8 - 23 mg/dL   Creatinine, Ser 0.63 0.61 - 1.24 mg/dL   Calcium 9.3 8.9 - 10.3 mg/dL   GFR, Estimated >60 >60 mL/min    Comment: (NOTE) Calculated using the CKD-EPI Creatinine Equation (2021)    Anion gap 8 5 - 15    Comment: Performed at [facility], [address] [place], [place] [address]  CBC     Status: Abnormal   Collection Time: 08/20/20  3:12 AM  Result Value Ref Range   WBC 9.9 4.0 - 10.5 K/uL   RBC 4.69 4.22 - 5.81 MIL/uL   Hemoglobin 13.0 13.0 - 17.0 g/dL   HCT 38.3 (L) 39.0 - 52.0 %   MCV 81.7 80.0 - 100.0 fL   MCH 27.7 26.0 - 34.0 pg   MCHC 33.9 30.0 - 36.0 g/dL   RDW 13.4 11.5 - 15.5 %   Platelets 258 150 - 400 K/uL   nRBC 0.0 0.0 - 0.2 %    Comment: Performed at  [facility], [place],  [address]    CT Angio Chest PE W and/or Wo Contrast  Result Date: 08/19/2020 CLINICAL DATA:  65 year old male with progressive shortness of breath over the last few days. No improvement with breathing treatments. EXAM: CT ANGIOGRAPHY CHEST WITH CONTRAST TECHNIQUE: Multidetector CT imaging of the chest was performed using the standard protocol during bolus administration of intravenous contrast. Multiplanar CT image reconstructions and MIPs were obtained to evaluate the vascular anatomy. CONTRAST:  76m OMNIPAQUE IOHEXOL 350 MG/ML SOLN COMPARISON:  Portable chest 0750 hours today. CTA chest 05/30/2017. two-view chest radiographs 03/30/2019. FINDINGS: Cardiovascular: Adequate contrast bolus timing in the pulmonary arterial tree. No focal filling defect identified in the pulmonary arteries to suggest acute pulmonary embolism. Calcified aortic atherosclerosis. No cardiomegaly or pericardial effusion. Mediastinum/Nodes: Stable small mediastinal lymph nodes. Stable hilar nodes. No lymphadenopathy. Lungs/Pleura:  Emphysema. Round and spiculated posterior right upper lobe lung nodule has increased from roughly 10 mm in 2019 2 up to 23 mm long axis now (17 mm on series 7, image 21). And there is an adjacent small new 6 mm nodule in an area of chronic emphysema change on series 7, image 19. Stable small spiculated right upper lobe area on series 7, image 32 suggesting benign etiology there. And stable vague nodularity in the left lower lobe on image 76. No pleural effusion or acute infectious opacity identified. Upper Abdomen: Negative visible liver, spleen, pancreas, adrenal glands, kidneys and bowel in the upper abdomen. Musculoskeletal: Mild to moderate T8 superior endplate compression fracture is new since 2019. This was mild in 2020. No retropulsion or complicating features. Other visible osseous structures appear stable. Review of the MIP images confirms the  above findings. IMPRESSION: 1. Negative for acute pulmonary embolus. 2. Emphysema (ICD10-J43.9) with spiculated and enlarging right upper lobe mass (series 7, image 21) most compatible with BRONCHOGENIC CARCINOMA. Recommend referral to [facility]). 3. No other acute pulmonary finding. Several additional small lung nodules are stable since 2019 and benign (images 32 and 76). 4. Chronic T8 compression fracture. Aortic Atherosclerosis (ICD10-I70.0). Electronically Signed   By: Genevie Ann M.D.   On: 08/19/2020 09:58   US Venous Img Lower Bilateral (DVT)  Result Date: 08/20/2020 CLINICAL DATA:  Positive D-dimer, tobacco use, lung cancer EXAM: BILATERAL LOWER EXTREMITY VENOUS DOPPLER ULTRASOUND TECHNIQUE: Gray-scale sonography with graded compression, as well as color Doppler and duplex ultrasound were performed to evaluate the lower extremity deep venous systems from the level of the common femoral vein and including the common femoral, femoral, profunda femoral, popliteal and calf veins including the posterior tibial, peroneal and gastrocnemius veins when visible. The superficial great saphenous vein was also interrogated. Spectral Doppler was utilized to evaluate flow at rest and with distal augmentation maneuvers in the common femoral, femoral and popliteal veins. COMPARISON:  None. FINDINGS: RIGHT LOWER EXTREMITY Common Femoral Vein: No evidence of thrombus. Normal compressibility, respiratory phasicity and response to augmentation. Saphenofemoral Junction: No evidence of thrombus. Normal compressibility and flow on color Doppler imaging. Profunda Femoral Vein: No evidence of thrombus. Normal compressibility and flow on color Doppler imaging. Femoral Vein: No evidence of thrombus. Normal compressibility, respiratory phasicity and response to augmentation. Popliteal Vein: No evidence of thrombus. Normal compressibility, respiratory phasicity and response to augmentation. Calf  Veins: No evidence of thrombus. Normal compressibility and flow on color Doppler imaging. LEFT LOWER EXTREMITY Common Femoral Vein: No evidence of thrombus. Normal compressibility, respiratory phasicity and response to augmentation. Saphenofemoral Junction: No evidence of thrombus. Normal compressibility and flow on color Doppler imaging. Profunda Femoral Vein: No evidence of thrombus. Normal compressibility and flow on color Doppler imaging. Femoral Vein: No evidence of thrombus. Normal compressibility, respiratory phasicity and response to augmentation. Popliteal Vein: No evidence of thrombus. Normal compressibility, respiratory phasicity and response to augmentation. Calf Veins: No evidence of thrombus. Normal compressibility and flow on color Doppler imaging. IMPRESSION: No evidence of deep venous thrombosis in either lower extremity. Electronically Signed   By: Jerilynn Mages.  Shick M.D.   On: 08/20/2020 08:04   DG Chest Portable 1 View  Result Date: 08/19/2020 CLINICAL DATA:  Shortness of breath EXAM: PORTABLE CHEST 1 VIEW COMPARISON:  March 30, 2019 FINDINGS: There is persistent interstitial thickening. Lungs are mildly hyperexpanded, stable. There is no edema or airspace opacity. Heart size and pulmonary vascularity are normal. No adenopathy. Evidence of  prior trauma involving the left clavicle. IMPRESSION: Suspect a degree of underlying chronic bronchitis. No edema or airspace opacity. Stable cardiac silhouette. Electronically Signed   By: Lowella Grip III M.D.   On: 08/19/2020 08:19    Review of Systems  A 10 point review of systems was performed and it is as noted above otherwise negative. Blood pressure 111/61, pulse 61, temperature 98.1 F (36.7 C), resp. rate 17, height 6' (1.829 m), weight 59.7 kg, SpO2 96 %. Physical Exam GENERAL: Disheveled appearing cachectic man, using accessories of respiration, notable conversational dyspnea. HEAD: Normocephalic, atraumatic.  EYES: Pupils equal, round,  reactive to light.  No scleral icterus.  MOUTH: Poor dentition, moist mucous membranes. NECK: Supple. No thyromegaly. Trachea midline. No JVD.  No adenopathy. PULMONARY: Accessory muscle use, positive Hoover sign.  Diffuse end expiratory wheezing, poor air movement, rhonchi in the upper lung zones. CARDIOVASCULAR: S1 and S2. Regular rate and rhythm.  Distant heart tones. ABDOMEN: Scaphoid, otherwise benign. MUSCULOSKELETAL: No joint deformity, no clubbing, no edema.  Diffuse muscle wasting. NEUROLOGIC: No overt focal deficit. SKIN: Intact,warm,dry. Nicotine stained fingers right hand. PSYCH: Flat/depressed affect.   Representative image from CT performed 26 Aug 2016 showing right upper lobe spiculated nodule:    Representative images from CT scan performed 26 April 2017 shows that area of spiculation seen in 2018 likely a scar and unchanged however possible early posterior nodule on the right upper lobe:       Representative image from CT scan performed 19 Aug 2020 showing right upper lobe mass and severe emphysema:     Assessment/Plan:  Left upper lobe mass/nodule carcinoma until proven otherwise Unfortunately patient is not a candidate for general anesthesia Poor performance status, very severe COPD Diagnosis would require navigational bronchoscopy Patient at high risk for pneumothorax with percutaneous biopsy Recommend PET/CT Consider SBRT without biopsy if PET/CT positive  Very severe COPD by clinical impression Advanced emphysema  COPD exacerbation Acute on chronic respiratory failure Patient will likely need oxygen supplementation Doubt he has breath-holding capacity for inhalers Recommend continuing DuoNeb 4 times a day Pulmicort twice a day after DuoNeb Agree with steroid taper Will need follow-up to manage COPD as outpatient Call when patient ready for discharge to arrange for follow-up  Tobacco dependence due to cigarettes Patient counseled with regards  to smoking cessation   Advanced disease with poor performance status Palliative care consultation in progress Agree with palliative care    Discussed with Dr. Grayland Ormond and Dr. Manuella Ghazi.     Renold Don, MD [facility]  08/20/2020, 7:14 PM   *This note was dictated using voice recognition software/Dragon.  Despite best efforts to proofread, errors can occur which can change the meaning.  Any change was purely unintentional.

## 2020-08-20 NOTE — Evaluation (Signed)
Occupational Therapy Evaluation Patient Details Name: Ruben [name] MRN: [id number] DOB: Mar 15, 1956 Today's Date: 08/20/2020    History of Present Illness 65 year old male with past medical history significant for esophageal stricture, hypertension, hyperlipidemia, COPD, stroke, GERD, depression with anxiety, CAD, history of tobacco abuse, atrial fibrillation who presents to the emergency room today for shortness of breath and chest tightness.  While in the emergency room, he received 2 duo nebs and Solu-Medrol IV with improvement of his oxygen saturations and breathing.  He was placed on BiPAP and SPO2 improved from 88% to 95%.  Lab work was fairly benign and COVID and flu were negative.  D-dimer slightly elevated prompting a CTA which showed emphysema with spiculated and enlarging right upper lobe mass most compatible with bronchogenic carcinoma.   Clinical Impression   Patient presenting with decreased I in self care, balance, functional mobility/transfers, endurance, and safety awareness. Patient reports living at home alone  PTA. He has a cousin nearby who he reports stops by several times a day to check on him. Pt does not own at AD at baseline but reports he has been feeling SOB for quite awhile but not like he has the week prior to hospital admission.  Patient currently functioning at min guard - close supervision. Pt on 2 L O2 at beginning of session and placed on RA. Pt was very anxious about this pt able to ambulate without AD to sink for grooming tasks with close supervision. When he returned to sitting on EOB O2 saturation at 91% but pt reports feeling very SOB and therefore placed on 1 L. RN notified. Pt very pleasant and agreeable this session and wants to return home with family support and HH therapies. Patient will benefit from acute OT to increase overall independence in the areas of ADLs, functional mobility, and safety awareness in order to safely discharge home.    Follow Up  Recommendations  Home health OT;Supervision - Intermittent    Equipment Recommendations  3 in 1 bedside commode;Other (comment) (Pt requesting SPC for home)       Precautions / Restrictions Precautions Precautions: Fall      Mobility Bed Mobility Overal bed mobility: Needs Assistance Bed Mobility: Supine to Sit;Sit to Supine     Supine to sit: Supervision Sit to supine: Supervision   General bed mobility comments: verbal cues from flat bed for technique and encouragement    Transfers Overall transfer level: Needs assistance Equipment used: None Transfers: Stand Pivot Transfers;Sit to/from Stand Sit to Stand: Min guard Stand pivot transfers: Min guard            Balance Overall balance assessment: Needs assistance Sitting-balance support: Feet supported Sitting balance-Leahy Scale: Good     Standing balance support: During functional activity Standing balance-Leahy Scale: Fair                             ADL either performed or assessed with clinical judgement   ADL Overall ADL's : Needs assistance/impaired     Grooming: Wash/dry hands;Wash/dry face;Oral care;Standing;Supervision/safety               Lower Body Dressing: Min guard;Sit to/from stand   Toilet Transfer: Min guard           Functional mobility during ADLs: Supervision/safety;Min guard       Vision Patient Visual Report: No change from baseline              Pertinent  Vitals/Pain Pain Assessment: No/denies pain     Hand Dominance Right   Extremity/Trunk Assessment Upper Extremity Assessment Upper Extremity Assessment: Generalized weakness   Lower Extremity Assessment Lower Extremity Assessment: Generalized weakness       Communication Communication Communication: No difficulties   Cognition Arousal/Alertness: Awake/alert Behavior During Therapy: WFL for tasks assessed/performed Overall Cognitive Status: Within Functional Limits for tasks assessed                                                 Home Living Family/patient expects to be discharged to:: Private residence Living Arrangements: Alone Available Help at Discharge: Family;Available PRN/intermittently Type of Home: House Home Access: Stairs to enter [employer] of Steps: 2 Entrance Stairs-Rails: Right;Left Home Layout: One level     Bathroom Shower/Tub: Tub only   Biochemist, clinical: Standard     Home Equipment: None          Prior Functioning/Environment Level of Independence: Independent        Comments: Pt's family checks on him several times a day. Pt has been experiencing SOB for years but "never this bad"        OT Problem List: Decreased strength;Impaired balance (sitting and/or standing);Decreased knowledge of precautions;Decreased safety awareness;Cardiopulmonary status limiting activity;Decreased activity tolerance;Decreased knowledge of use of DME or AE      OT Treatment/Interventions: Self-care/ADL training;Manual therapy;Therapeutic exercise;Modalities;Patient/family education;Balance training;Energy conservation;Therapeutic activities;DME and/or AE instruction    OT Goals(Current goals can be found in the care plan section) Acute Rehab OT Goals Patient Stated Goal: to feel better OT Goal Formulation: With patient Time For Goal Achievement: 09/03/20 Potential to Achieve Goals: Good  OT Frequency: Min 2X/week   Barriers to D/C:    none known at this time          AM-PAC OT "6 Clicks" Daily Activity     Outcome Measure Help from another person eating meals?: None Help from another person taking care of personal grooming?: None Help from another person toileting, which includes using toliet, bedpan, or urinal?: A Little Help from another person bathing (including washing, rinsing, drying)?: A Little Help from another person to put on and taking off regular upper body clothing?: None Help from another person to  put on and taking off regular lower body clothing?: A Little 6 Click Score: 21   End of Session Equipment Utilized During Treatment: Oxygen (1L O2) Nurse Communication: Mobility status;Other (comment) (O2 decreased to 1 L)  Activity Tolerance: Patient tolerated treatment well Patient left: in bed;with call bell/phone within reach;with bed alarm set  OT Visit Diagnosis: Unsteadiness on feet (R26.81);Muscle weakness (generalized) (M62.81)                Time: 0539-7673 OT Time Calculation (min): 38 min Charges:  OT General Charges $OT Visit: 1 Visit OT Evaluation $OT Eval Low Complexity: 1 Low OT Treatments $Self Care/Home Management : 8-22 mins $Therapeutic Activity: 8-22 mins  Darleen Crocker, MS, OTR/L , CBIS ascom [phone]  08/20/20, 4:34 PM

## 2020-08-20 NOTE — Progress Notes (Signed)
Initial Nutrition Assessment  DOCUMENTATION CODES:   Severe malnutrition in context of chronic illness  INTERVENTION:   Ensure Enlive po TID, each supplement provides 350 kcal and 20 grams of protein  Magic cup TID with meals, each supplement provides 290 kcal and 9 grams of protein  MVI po daily   Liberalize diet   NUTRITION DIAGNOSIS:   Severe Malnutrition related to chronic illness (lung cancer, COPD, CHF) as evidenced by severe muscle depletion,severe fat depletion.  GOAL:   Patient will meet greater than or equal to 90% of their needs  MONITOR:   PO intake,Supplement acceptance,Labs,Weight trends,Skin,I & O's  REASON FOR ASSESSMENT:   Consult Assessment of nutrition requirement/status  ASSESSMENT:   65 y.o. male with medical history significant of esophageal stricture, hypertension, hyperlipidemia, COPD, stroke, GERD, depression with anxiety, CAD, lung cancer, tobacco abuse and possible atrial fibrillation (patient is on digoxin and Cardizem) who presents with SOB.   Met with pt in room today. Pt reports good appetite and oral intake pta and in hospital. Pt reports eating a hamburger and some fries for lunch today; pt eating 100% of meals in hospital. Pt reports that he used to drink 2 Ensure per day at home but that he stopped drinking it because it was so expensive. Pt would like to have chocolate Ensure in hospital. RD discussed with pt the importance of adequate protein intake needed to preserve lean muscle. RD will add supplements and MVI to help pt meet his estimated needs. RD will also liberalize the heart healthy portion of pt's diet as this is restrictive of protein. Pt reports that he used to weigh over 200lbs many years ago; per chart, pt has been weight stable for the past year.    Medications reviewed and include: lovenox, lasix, solu-medrol, MVI, nicotine, protonix  Labs reviewed: Na 132(L)  NUTRITION - FOCUSED PHYSICAL EXAM:  Flowsheet Row Most Recent  Value  Orbital Region Severe depletion  Upper Arm Region Severe depletion  Thoracic and Lumbar Region Severe depletion  Buccal Region Severe depletion  Temple Region Severe depletion  Clavicle Bone Region Severe depletion  Clavicle and Acromion Bone Region Severe depletion  Scapular Bone Region Severe depletion  Dorsal Hand Severe depletion  Patellar Region Severe depletion  Anterior Thigh Region Severe depletion  Posterior Calf Region Severe depletion  Edema (RD Assessment) Mild  Hair Reviewed  Eyes Reviewed  Mouth Reviewed  Skin Reviewed  Nails Reviewed     Diet Order:   Diet Order            Diet 2 gram sodium Room service appropriate? Yes; Fluid consistency: Thin  Diet effective now                EDUCATION NEEDS:   Education needs have been addressed  Skin:  Skin Assessment: Reviewed RN Assessment (ecchymosis)  Last BM:  5/9  Height:   Ht Readings from Last 1 Encounters:  08/19/20 6' (1.829 m)    Weight:   Wt Readings from Last 1 Encounters:  08/20/20 59.7 kg    Ideal Body Weight:  80.9 kg  BMI:  Body mass index is 17.85 kg/m.  Estimated Nutritional Needs:   Kcal:  2000-2300kcal/day  Protein:  100-115g/day  Fluid:  1.8L/day  Koleen Distance MS, RD, LDN Please refer to [facility] for RD and/or RD on-call/weekend/after hours pager

## 2020-08-20 NOTE — Progress Notes (Signed)
[place] [facility] [place] NAME: Ruben [name]    MR#:  [id number]  DATE OF BIRTH:  11-Jun-1955  SUBJECTIVE:  CHIEF COMPLAINT:   Chief Complaint  Patient presents with  . Shortness of Breath  Reports worsening shortness of breath especially in last 3 days although feels somewhat better while here in the hospital.  Willing to undergo full work-up as needed.  He did not get his lung mass worked up in the past due to other priorities of life. REVIEW OF SYSTEMS:  Review of Systems  Constitutional: Positive for malaise/fatigue. Negative for diaphoresis, fever and weight loss.  HENT: Negative for ear discharge, ear pain, hearing loss, nosebleeds, sore throat and tinnitus.   Eyes: Negative for blurred vision and pain.  Respiratory: Positive for cough and shortness of breath. Negative for hemoptysis and wheezing.   Cardiovascular: Negative for chest pain, palpitations, orthopnea and leg swelling.  Gastrointestinal: Negative for abdominal pain, blood in stool, constipation, diarrhea, heartburn, nausea and vomiting.  Genitourinary: Negative for dysuria, frequency and urgency.  Musculoskeletal: Negative for back pain and myalgias.  Skin: Negative for itching and rash.  Neurological: Negative for dizziness, tingling, tremors, focal weakness, seizures, weakness and headaches.  Psychiatric/Behavioral: Positive for depression. The patient is nervous/anxious.    DRUG ALLERGIES:  No Known Allergies VITALS:  Blood pressure 117/60, pulse 60, temperature 97.8 F (36.6 C), resp. rate 17, height 6' (1.829 m), weight 59.7 kg, SpO2 99 %. PHYSICAL EXAMINATION:  Physical Exam  65 year old cachectic looking male lying in the bed comfortably without any acute distress.  He looks older than his stated age. Lungs Wheezing at the bases.  No accessory muscles of respiration in use.  No rhonchi or rales Cardiovascular S1-S2 normal, no murmur rales or gallop Abdomen soft,  benign Extremities no pedal edema cyanosis or clubbing Neuro alert and oriented, nonfocal exam Skin no rash or lesion Psych: Normal mood and affect LABORATORY PANEL:  Male CBC Recent Labs  Lab 08/20/20 0312  WBC 9.9  HGB 13.0  HCT 38.3*  PLT 258   ------------------------------------------------------------------------------------------------------------------ Chemistries  Recent Labs  Lab 08/19/20 0745 08/20/20 0312  NA 135 132*  K 3.6 4.8  CL 97* 97*  CO2 26 27  GLUCOSE 109* 164*  BUN 10 17  CREATININE 0.65 0.63  CALCIUM 9.3 9.3  MG 1.9  --   AST 13*  --   ALT 6  --   ALKPHOS 69  --   BILITOT 0.8  --    RADIOLOGY:  US Venous Img Lower Bilateral (DVT)  Result Date: 08/20/2020 CLINICAL DATA:  Positive D-dimer, tobacco use, lung cancer EXAM: BILATERAL LOWER EXTREMITY VENOUS DOPPLER ULTRASOUND TECHNIQUE: Gray-scale sonography with graded compression, as well as color Doppler and duplex ultrasound were performed to evaluate the lower extremity deep venous systems from the level of the common femoral vein and including the common femoral, femoral, profunda femoral, popliteal and calf veins including the posterior tibial, peroneal and gastrocnemius veins when visible. The superficial great saphenous vein was also interrogated. Spectral Doppler was utilized to evaluate flow at rest and with distal augmentation maneuvers in the common femoral, femoral and popliteal veins. COMPARISON:  None. FINDINGS: RIGHT LOWER EXTREMITY Common Femoral Vein: No evidence of thrombus. Normal compressibility, respiratory phasicity and response to augmentation. Saphenofemoral Junction: No evidence of thrombus. Normal compressibility and flow on color Doppler imaging. Profunda Femoral Vein: No evidence of thrombus. Normal compressibility and flow on color  Doppler imaging. Femoral Vein: No evidence of thrombus. Normal compressibility, respiratory phasicity and response to augmentation. Popliteal Vein: No  evidence of thrombus. Normal compressibility, respiratory phasicity and response to augmentation. Calf Veins: No evidence of thrombus. Normal compressibility and flow on color Doppler imaging. LEFT LOWER EXTREMITY Common Femoral Vein: No evidence of thrombus. Normal compressibility, respiratory phasicity and response to augmentation. Saphenofemoral Junction: No evidence of thrombus. Normal compressibility and flow on color Doppler imaging. Profunda Femoral Vein: No evidence of thrombus. Normal compressibility and flow on color Doppler imaging. Femoral Vein: No evidence of thrombus. Normal compressibility, respiratory phasicity and response to augmentation. Popliteal Vein: No evidence of thrombus. Normal compressibility, respiratory phasicity and response to augmentation. Calf Veins: No evidence of thrombus. Normal compressibility and flow on color Doppler imaging. IMPRESSION: No evidence of deep venous thrombosis in either lower extremity. Electronically Signed   By: Jerilynn Mages.  Shick M.D.   On: 08/20/2020 08:04   ASSESSMENT AND PLAN:  65 year old male with a known history of esophageal stricture, COPD, stroke, GERD, depression with anxiety, CAD, lung cancer, tobacco abuse and possible atrial fibrillation is admitted for worsening shortness of breath.  Acute on chronic hypoxic respiratory failure due to COPD exacerbation with underlying lung mass Initially required BiPAP which has been weaned off.  He is on 4 L oxygen via nasal cannula at this time. Continue steroids, empiric antibiotic, incentive spirometry and Mucinex.  Right upper lobe lung mass worrisome for cancer Appreciate pulmonary and oncology input Will likely need bronchoscopy as an outpatient  Depression and depression -Continue home medications  Protein-calorie malnutrition, severe: BMI 16.8 -Consult nutritionist -Ensure  HTN (hypertension) -IV hydralazine as needed -Continue metoprolol, Cardizem, Lasix  HLD  (hyperlipidemia) -Lipitor  Stroke ([place]) -Lipitor  GERD (gastroesophageal reflux disease) -Protonix  Chronic diastolic CHF (congestive heart failure) ([place]): 2D echo on 08/31/2016 showed EF 65 to 70% with grade 1 diastolic dysfunction.  BNP 69.1.  CHF seem to be well compensated. -Continue home Lasix  Tobacco abuse -Nicotine patch  Consult PT, OT for disposition planning  Net IO Since Admission: -330 mL [08/20/20 1251]      Status is: Inpatient  Remains inpatient appropriate because:Ongoing diagnostic testing needed not appropriate for outpatient work up   Dispo: The patient is from: Home              Anticipated d/c is to: Home              Patient currently is not medically stable to d/c.   Difficult to place patient No    DVT prophylaxis:       enoxaparin (LOVENOX) injection 40 mg Start: 08/19/20 2200     Family Communication: "discussed with patient"   All the records are reviewed and case discussed with Care Management/Social Worker. Management plans discussed with the patient, nursing, oncology, pulmonary and they are in agreement.  CODE STATUS: Full Code Level of care: Progressive Cardiac  TOTAL TIME TAKING CARE OF THIS PATIENT: 35 minutes.   More than 50% of the time was spent in counseling/coordination of care: YES  POSSIBLE D/C IN 1-2 DAYS, DEPENDING ON CLINICAL CONDITION.   Max Sane M.D on 08/20/2020 at 12:51 PM  [facility]   CC: Primary care physician; Ranae Plumber, PA  Note: This dictation was prepared with Dragon dictation along with smaller phrase technology. Any transcriptional errors that result from this process are unintentional.

## 2020-08-20 NOTE — Evaluation (Signed)
Physical Therapy Evaluation Patient Details Name: ANTWAUN BUTH MRN: [id number] DOB: August 04, 1955 Today's Date: 08/20/2020   History of Present Illness  65 year old male with past medical history significant for esophageal stricture, hypertension, hyperlipidemia, COPD, stroke, GERD, depression with anxiety, CAD, history of tobacco abuse, atrial fibrillation who presents to the emergency room today for shortness of breath and chest tightness.  While in the emergency room, he received 2 duo nebs and Solu-Medrol IV with improvement of his oxygen saturations and breathing.  He was placed on BiPAP and SPO2 improved from 88% to 95%.  Lab work was fairly benign and COVID and flu were negative.  D-dimer slightly elevated prompting a CTA which showed emphysema with spiculated and enlarging right upper lobe mass most compatible with bronchogenic carcinoma.  Clinical Impression  Pt pleasant and eager to work with PT.  He was on 1L O2 on arrival with sats in the high 90s, remained in the 90s on room air during activity apart from brief dips to 89%, mild fatigue but no dyspnea.  PT showed good relative steadiness with static balance and during slow, guarded ambulation.  Pt not at his baseline and will benefit from HHPT at d/c.      Follow Up Recommendations Home health PT;Supervision - Intermittent    Equipment Recommendations   (discussed getting/using cane)    Recommendations for Other Services       Precautions / Restrictions Precautions Precautions: Fall Restrictions Weight Bearing Restrictions: No      Mobility  Bed Mobility Overal bed mobility: Modified Independent Bed Mobility: Supine to Sit;Sit to Supine     Supine to sit: Supervision Sit to supine: Supervision   General bed mobility comments: verbal cues from flat bed for technique and encouragement    Transfers Overall transfer level: Needs assistance Equipment used: None Transfers: Sit to/from Stand Sit to Stand: Min  guard Stand pivot transfers: Min guard       General transfer comment: Pt with mild guarding when first getting up, but ultimately safe and done w/o issue  Ambulation/Gait Ambulation/Gait assistance: Min guard Gait Distance (Feet): 125 Feet Assistive device: None       General Gait Details: On room air, O2 remained in the 90s for all but 2 brief drops to 89% .  He had slow but safe gait with no LOBs and only mild c/o fatigue.  Stairs            Wheelchair Mobility    Modified Rankin (Stroke Patients Only)       Balance Overall balance assessment: Needs assistance Sitting-balance support: Feet supported Sitting balance-Leahy Scale: Good     Standing balance support: During functional activity;No upper extremity supported Standing balance-Leahy Scale: Fair                               Pertinent Vitals/Pain Pain Assessment: No/denies pain    Home Living Family/patient expects to be discharged to:: Private residence Living Arrangements: Alone Available Help at Discharge: Family;Available PRN/intermittently Type of Home: House Home Access: Stairs to enter Entrance Stairs-Rails: Psychiatric nurse of Steps: 2 Home Layout: One level Home Equipment: None      Prior Function Level of Independence: Independent         Comments: Pt's family checks on him several times a day.     Hand Dominance   Dominant Hand: Right    Extremity/Trunk Assessment   Upper Extremity Assessment Upper Extremity  Assessment: Generalized weakness;Overall [facility] for tasks assessed    Lower Extremity Assessment Lower Extremity Assessment: Generalized weakness;Overall WFL for tasks assessed       Communication   Communication: No difficulties  Cognition Arousal/Alertness: Awake/alert Behavior During Therapy: WFL for tasks assessed/performed Overall Cognitive Status: Within Functional Limits for tasks assessed                                         General Comments      Exercises     Assessment/Plan    PT Assessment Patient needs continued PT services  PT Problem List Decreased strength;Decreased range of motion;Decreased activity tolerance;Decreased balance;Decreased mobility;Decreased safety awareness;Cardiopulmonary status limiting activity       PT Treatment Interventions Gait training;Stair training;Functional mobility training;Therapeutic activities;Therapeutic exercise;Balance training;Neuromuscular re-education;Patient/family education    PT Goals (Current goals can be found in the Care Plan section)  Acute Rehab PT Goals Patient Stated Goal: go home PT Goal Formulation: With patient Time For Goal Achievement: 09/03/20 Potential to Achieve Goals: Good    Frequency Min 2X/week   Barriers to discharge        Co-evaluation               AM-PAC PT "6 Clicks" Mobility  Outcome Measure Help needed turning from your back to your side while in a flat bed without using bedrails?: None Help needed moving from lying on your back to sitting on the side of a flat bed without using bedrails?: None Help needed moving to and from a bed to a chair (including a wheelchair)?: A Little Help needed standing up from a chair using your arms (e.g., wheelchair or bedside chair)?: None Help needed to walk in hospital room?: A Little Help needed climbing 3-5 steps with a railing? : A Little 6 Click Score: 21    End of Session Equipment Utilized During Treatment: Gait belt Activity Tolerance: Patient tolerated treatment well Patient left: with bed alarm set;with call bell/phone within reach Nurse Communication: Mobility status (O2 during ambulation) PT Visit Diagnosis: Muscle weakness (generalized) (M62.81);Unsteadiness on feet (R26.81)    Time: 2248-2500 PT Time Calculation (min) (ACUTE ONLY): 25 min   Charges:   PT Evaluation $PT Eval Low Complexity: 1 Low PT Treatments $Gait Training: 8-22 mins         Kreg Shropshire, DPT 08/20/2020, 6:43 PM

## 2020-08-20 NOTE — Progress Notes (Signed)
Patient was seen and examined, full consultation note to follow.  Evaluate patient for potential bronchoscopy with navigational assistance for diagnosis of a mass on the right upper lobe.  By clinical impression and by review of the patient's imaging studies the patient has advanced COPD.  He was admitted with acute exacerbation acute hypercarbic respiratory failure.  He is a very disheveled appearing gentleman, very debilitated appearing.  Using accessories of respiration, continues to exhibit wheezing and rhonchi.  Patient is very realistic with regards to goals of care and does not want invasive procedures.  He is currently at prohibitive risk for bronchoscopy with navigational assistance as this would require general anesthesia which at present I do not believe this patient can tolerate.  In addition he has very severe emphysema which will make him a higher risk for complications from the procedure.  Agree with corticosteroids.  Can switch to p.o. in a.m.  I suspect that he does not have the breath-holding capacity to do inhalers correctly.  Recommend: DuoNeb 4 times a day via nebulizer and follow with Pulmicort 0.5 mg twice a day via nebulizer.     He will need evaluation for supplemental oxygen prior to discharge.  He also states that he needs a new nebulizer machine for home use.   We will arrange for follow-up to continue managing his respiratory issues.  He continues to engage in tobacco and marijuana use and recommend that he stop smoking all substances due to the severity of his COPD.   As noted, formal consult note to follow.  Renold Don, MD Secretary PCCM   *This note was dictated using voice recognition software/Dragon.  Despite best efforts to proofread, errors can occur which can change the meaning.  Any change was purely unintentional.

## 2020-08-20 NOTE — Progress Notes (Signed)
   08/20/20 1700  Clinical Encounter Type  Visited With Patient  Visit Type Spiritual support;Initial  Referral From Nurse  Consult/Referral To [place]   This chaplain received an Order Requisition to assist the patient with completing or updating an AD. Upon my arrival, the patient was sitting up in bed, awake, alert and oriented. He shared that he had spilled his beverage on the floor to the right of his bed and he was very apologetic, emphasizing that he did not wish to burden staff any further. This Probation officer offered to assist and/or request assistance of nursing staff. The patient declined my assistance, so I made staff aware of his need. As we awaited their arrival, we briefly talked about dinner and his favorite foods.   The patient confirmed his interest in the AD, but requested that someone speak with him about it tomorrow morning. Will relay this request to on-coming chaplain staff for follow-up on 5/12.  Gennaro Africa, Chaplain

## 2020-08-20 NOTE — Progress Notes (Signed)
pts iv is tender to touch and occluded / IV removed/ MD made aware/ OK for pt to have no IV access/ plans to discharge tomorrow

## 2020-08-20 NOTE — Consult Note (Signed)
Consultation Note Date: 08/20/2020   Patient Name: Ruben Ruben [name]  DOB: [birth year]  MRN: [id number]  Age / Sex: 65 y.o., male  PCP: Ruben Ruben [name], [place] Referring Physician: Max Sane, MD  Reason for Consultation: Establishing goals of care and Psychosocial/spiritual support  HPI/Patient Profile: 65 y.o. male  with past medical history of  esophageal stricture, HTN/HLD, COPD, stroke, GERD, depression/anxiety, CAD, lung cancer, tobacco abuse, possible afib.  [facility] palliative, last seen Nov 2020  admitted on 08/19/2020 with acute on chroinic respiratory failure 2/2 COPD exacerbation and underlying lung mass.   Clinical Assessment and Goals of Care: I have reviewed medical records including EPIC notes, labs and imaging, assessed the patient.  Mr. Ruben Ruben [name], Ruben [name], greets me, making and keeping eye contact.  He appears acutely/chronically ill and quite frail and thin.  He is alert and oriented, able to make his needs known.  There is no family at bedside at this time. I introduced Palliative Medicine as specialized medical care for people living with serious illness. It focuses on providing relief from the symptoms and stress of a serious illness. The goal is to improve quality of life for both the patient and the family.  We discussed a brief life review of the patient.  Ruben Ruben [name] tells me that he is unmarried, and has children, but they live in [place].  He shares that his cousin, Ruben Ruben [name] lives behind him and assist him as needed.  He tells me that he would not have her as his healthcare surrogate and is interested in completing paperwork.    We discuss diagnosis prognosis, GOC, EOL wishes, disposition and options. We talked about his acute health concerns.  I ask if he feels that his breathing has improved and he shares, "not really".  We talked about his need for outpatient bronchoscopy for biopsy, and he is  aware of this and able to verbalize the plan.  I asked Ruben Ruben [name], "you have had lung cancer in the past?".  He shares that a few years ago he was told he had cancer versus a blockage and he was not sure that he had cancer.  He tells me that he did not follow-up.   I attempted to elicit values and goals of care important to the patient.    We talked about PT evaluation for short-term rehab.  He tells me that he would prefer to return to his own home.     Advanced directives, concepts specific to code status, were considered and discussed.  At this point, Ruben Ruben [name] tells me that he would not want a trial of CPR and life support.  He does however set limits of no more than 10 days on ventilator support.   The difference between aggressive medical intervention and comfort care was considered in light of the patient's goals of care.  I shared that if we are not able to obtain a biopsy, and he does indeed have cancer but declines treatment, the medical team would recommend DNR.  He shares that that makes sense, and  agrees.  Palliative Care services outpatient were explained and offered.  Ruben Ruben [name] was active with a [facility] outpatient [facility] services, last seen November 2020.  He is agreeable to resume the services with a [facility].  Discussed the importance of continued conversation with family and the medical providers regarding overall plan of care and treatment options, ensuring decisions are within the context of the patient's values and GOCs.   Questions and concerns were addressed. Encouraged to call with questions or concerns.  PMT will continue to support holistically.  Conference with attending, bedside nursing staff, transition of care team related to patient condition, needs, goals of care, disposition. PMT to continue to follow.   [name] states that he would like for his cousin, Ruben Ruben [name] to be his healthcare surrogate.  Spiritual care team consulted for completion paperwork.     SUMMARY OF RECOMMENDATIONS   At this point continue to treat the treatable Set limits on 10 days of life support Continue CODE STATUS discussions Outpatient palliative services to follow. Outpatient bronchoscopy/oncology follow-ups.  Code Status/Advance Care Planning:  Full code -set limits on 10 days of life support  Symptom Management:   Per hospitalist, no additional needs at this time   Palliative Prophylaxis:   Frequent Pain Assessment and Oral Care  Additional Recommendations (Limitations, Scope, Preferences):  Full Scope Treatment  Psycho-social/Spiritual:   Desire for further Chaplaincy support:no  Additional Recommendations: Caregiving  Support/Resources and Education on Hospice  Prognosis:   Unable to determine, based on outcomes, oncology recommendations.   Discharge Planning: to be determined, based on outcomes/needs       Primary Diagnoses: Present on Admission: . Acute on chronic respiratory failure with hypoxia ([place]) . COPD exacerbation ([place]) . Chronic diastolic CHF (congestive heart failure) ([place]) . GERD (gastroesophageal reflux disease) . HLD (hyperlipidemia) . HTN (hypertension) . Stroke ([place]) . Tobacco abuse . Depression with anxiety . Protein-calorie malnutrition, severe . Lung cancer ([place]) . Sepsis ([place])   I have reviewed the medical record, interviewed the patient and family, and examined the patient. The following aspects are pertinent.  Past Medical History:  Diagnosis Date  . Cancer ([place])    lung cancer  . CHF (congestive heart failure) ([place])   . COPD (chronic obstructive pulmonary disease) ([place])   . Coronary artery disease   . Depression   . Food impaction of esophagus   . Food impaction of esophagus   . Food impaction of esophagus   . Hypertension   . Myocardial infarction ([place])   . Stroke [facility])    Social History   Socioeconomic History  . Marital status: Legally Separated    Spouse name: Not on file  . Number  of children: Not on file  . Years of education: Not on file  . Highest education level: Not on file  Occupational History  . Occupation: unemployed  Tobacco Use  . Smoking status: Current Every Day Smoker    Packs/day: 1.00    Years: 43.00    Pack years: 43.00    Types: Cigarettes  . Smokeless tobacco: Never Used  Vaping Use  . Vaping Use: Never used  Substance and Sexual Activity  . Alcohol use: No  . Drug use: Yes    Frequency: 4.0 times per week    Types: Marijuana  . Sexual activity: Yes  Other Topics Concern  . Not on file  Social History Narrative   Independent at baseline.   Social Determinants of Health  Financial Resource Strain: Not on file  Food Insecurity: Not on file  Transportation Needs: Not on file  Physical Activity: Not on file  Stress: Not on file  Social Connections: Not on file   Family History  Problem Relation Age of Onset  . Heart disease Other   . Hypertension Other   . Diabetes Other   . CAD Mother    Scheduled Meds: . atorvastatin  20 mg Oral QHS  . azithromycin  250 mg Oral Daily  . diazepam  10 mg Oral BID  . digoxin  0.25 mg Oral Daily  . diltiazem  300 mg Oral Daily  . enoxaparin (LOVENOX) injection  40 mg Subcutaneous Q24H  . feeding supplement  237 mL Oral BID BM  . furosemide  20 mg Oral Daily  . ipratropium-albuterol  3 mL Nebulization Q6H  . methylPREDNISolone (SOLU-MEDROL) injection  40 mg Intravenous BID  . metoprolol tartrate  25 mg Oral BID  . nicotine  21 mg Transdermal Daily  . pantoprazole  40 mg Oral BID  . sodium chloride flush  10 mL Intravenous Q12H   Continuous Infusions: PRN Meds:.acetaminophen, albuterol, dextromethorphan-guaiFENesin, hydrALAZINE, nitroGLYCERIN, ondansetron (ZOFRAN) IV Medications Prior to Admission:  Prior to Admission medications   Medication Sig Start Date End Date Taking? Authorizing Provider  albuterol (PROVENTIL HFA;VENTOLIN HFA) 108 (90 Base) MCG/ACT inhaler Inhale 2 puffs into the  lungs every 6 (six) hours as needed for wheezing or shortness of breath. 02/19/18  Yes Epifanio Lesches, MD  atorvastatin (LIPITOR) 20 MG tablet Take 1 tablet by mouth at bedtime. 05/03/20  Yes [provider]  diazepam (VALIUM) 10 MG tablet Take 10 mg by mouth 2 (two) times daily. 07/28/20  Yes [provider]  digoxin (LANOXIN) 0.25 MG tablet Take 1 tablet (0.25 mg total) by mouth daily. 02/19/18  Yes Epifanio Lesches, MD  diltiazem (CARDIZEM CD) 300 MG 24 hr capsule Take 1 capsule (300 mg total) by mouth daily. 02/03/19  Yes Ojie, Jude, MD  ipratropium-albuterol (DUONEB) 0.5-2.5 (3) MG/3ML SOLN Take 3 mLs by nebulization every 4 (four) hours as needed (shortness of breath).   Yes [provider]  metoprolol tartrate (LOPRESSOR) 25 MG tablet Take 1 tablet (25 mg total) by mouth 2 (two) times daily. 02/19/18  Yes Epifanio Lesches, MD  atorvastatin (LIPITOR) 10 MG tablet Take 1 tablet (10 mg total) by mouth daily. Patient not taking: No sig reported 02/20/18   Epifanio Lesches, MD  diazepam (VALIUM) 5 MG tablet Take 1 tablet (5 mg total) by mouth 2 (two) times daily. Do not drive or operate machinery while on medication. Patient not taking: No sig reported 02/03/19   Otila Back, MD  furosemide (LASIX) 20 MG tablet Take 1 tablet (20 mg total) by mouth daily. 04/28/18 04/28/19  Hillary Bow, MD  mometasone-formoterol (DULERA) 200-5 MCG/ACT AERO Inhale 2 puffs into the lungs 2 (two) times daily. Patient not taking: No sig reported 02/19/18   Epifanio Lesches, MD  Multiple Vitamin (MULTIVITAMIN WITH MINERALS) TABS tablet Take 1 tablet by mouth daily. Patient not taking: No sig reported 02/04/19   Stark Jock, Jude, MD  nicotine (NICODERM CQ - DOSED IN MG/24 HOURS) 21 mg/24hr patch Place 1 patch (21 mg total) onto the skin daily. 02/04/19   Stark Jock Jude, MD  omeprazole (PRILOSEC) 40 MG capsule Take 1 capsule (40 mg total) by mouth 2 (two) times daily before a meal.  03/30/19 04/29/19  Vanga, Tally Due, MD  pantoprazole (PROTONIX) 40 MG tablet Take  1 tablet (40 mg total) by mouth daily. Patient not taking: Reported on 08/19/2020 02/04/19   Otila Back, MD  senna-docusate (SENOKOT-S) 8.6-50 MG tablet Take 1 tablet by mouth at bedtime as needed for mild constipation. Patient not taking: No sig reported 02/03/19   Otila Back, MD  sertraline (ZOLOFT) 100 MG tablet Take 100 mg by mouth daily.  Patient not taking: No sig reported    [provider]   No Known Allergies Review of Systems  Unable to perform ROS: Other    Physical Exam Vitals and nursing note reviewed.     Vital Signs: BP 111/61 (BP Location: Left Arm)   Pulse 61   Temp 98.1 F (36.7 C)   Resp 17   Ht 6' (1.829 m)   Wt 59.7 kg   SpO2 96%   BMI 17.85 kg/m  Pain Scale: 0-10   Pain Score: 0-No pain   SpO2: SpO2: 96 % O2 Device:SpO2: 96 % O2 Flow Rate: .O2 Flow Rate (L/min): 2 L/min  IO: Intake/output summary:   Intake/Output Summary (Last 24 hours) at 08/20/2020 1536 Last data filed at 08/20/2020 1400 Gross per 24 hour  Intake 1080 ml  Output 1500 ml  Net -420 ml    LBM: Last BM Date: 08/18/20 Baseline Weight: Weight: 56.3 kg Most recent weight: Weight: 59.7 kg     Palliative Assessment/Data:   Flowsheet Rows   Flowsheet Row Most Recent Value  Intake Tab   Referral Department Hospitalist  Unit at Time of Referral Med/Surg Unit  Palliative Care Primary Diagnosis Pulmonary  Date Notified 08/20/20  Palliative Care Type New Palliative care  Reason for referral Clarify Goals of Care  Date first seen by Palliative Care 08/20/20  # of days Palliative referral response time 0 Day(s)  Clinical Assessment   Palliative Performance Scale Score 50%  Pain Ruben last 24 hours Not able to report  Pain Min Last 24 hours Not able to report  Dyspnea Ruben Last 24 Hours Not able to report  Dyspnea Min Last 24 hours Not able to report  Psychosocial & Spiritual Assessment    Palliative Care Outcomes       Time In: 1500  Time Out: 1610 Time Total: 70 minutes  Greater than 50%  of this time was spent counseling and coordinating care related to the above assessment and plan.  Signed by: Drue Novel, NP   Please contact Palliative Medicine Team phone at [phone] for questions and concerns.  For individual provider: See Shea Evans

## 2020-08-21 ENCOUNTER — Encounter: Payer: Self-pay | Admitting: *Deleted

## 2020-08-21 DIAGNOSIS — J9601 Acute respiratory failure with hypoxia: Secondary | ICD-10-CM

## 2020-08-21 DIAGNOSIS — R911 Solitary pulmonary nodule: Secondary | ICD-10-CM

## 2020-08-21 DIAGNOSIS — I700 Atherosclerosis of aorta: Secondary | ICD-10-CM

## 2020-08-21 LAB — CBC
HCT: 40 % (ref 39.0–52.0)
Hemoglobin: 13.2 g/dL (ref 13.0–17.0)
MCH: 27.2 pg (ref 26.0–34.0)
MCHC: 33 g/dL (ref 30.0–36.0)
MCV: 82.5 fL (ref 80.0–100.0)
Platelets: 357 10*3/uL (ref 150–400)
RBC: 4.85 MIL/uL (ref 4.22–5.81)
RDW: 13.5 % (ref 11.5–15.5)
WBC: 13.7 10*3/uL — ABNORMAL HIGH (ref 4.0–10.5)
nRBC: 0 % (ref 0.0–0.2)

## 2020-08-21 LAB — BASIC METABOLIC PANEL
Anion gap: 9 (ref 5–15)
BUN: 31 mg/dL — ABNORMAL HIGH (ref 8–23)
CO2: 29 mmol/L (ref 22–32)
Calcium: 9.5 mg/dL (ref 8.9–10.3)
Chloride: 97 mmol/L — ABNORMAL LOW (ref 98–111)
Creatinine, Ser: 0.71 mg/dL (ref 0.61–1.24)
GFR, Estimated: >60 mL/min (ref >60)
Glucose, Bld: 146 mg/dL — ABNORMAL HIGH (ref 70–99)
Potassium: 4.8 mmol/L (ref 3.5–5.1)
Sodium: 135 mmol/L (ref 135–145)

## 2020-08-21 MED ORDER — PREDNISONE 50 MG PO TABS
50.0000 mg | ORAL_TABLET | Freq: Every day | ORAL | Status: DC
  Administered 2020-08-22 – 2020-08-23 (×2): 50 mg via ORAL
  Filled 2020-08-21 (×2): qty 1

## 2020-08-21 MED ORDER — HYDROCODONE-ACETAMINOPHEN 5-325 MG PO TABS
1.0000 | ORAL_TABLET | ORAL | Status: DC | PRN
Start: 2020-08-21 — End: 2020-08-25
  Administered 2020-08-21 – 2020-08-25 (×9): 1 via ORAL
  Filled 2020-08-21 (×9): qty 1

## 2020-08-21 NOTE — Progress Notes (Signed)
  Oncology Nurse Navigator Documentation  Navigator Location: [facility]-Med Onc (08/21/20 1000) Referral Date to RadOnc/MedOnc: 08/20/20 (08/21/20 1000) )Navigator Encounter Type: Appt/Treatment Plan Review (08/21/20 1000)   Abnormal Finding Date: 08/19/20 (08/21/20 1000)                 Patient Visit Type: Inpatient (08/21/20 1000) Treatment Phase: Abnormal Scans (08/21/20 1000) Barriers/Navigation Needs: Coordination of Care (08/21/20 1000)   Interventions: Coordination of Care;Referrals (08/21/20 1000) Referrals: Radiation Oncology;Medical Oncology (08/21/20 1000) Coordination of Care: Appts;Radiology (08/21/20 1000)       inpatient referral received. Per Faythe Casa, NP and Dr. Patsey Berthold, pt is not a candidate for biopsy at this time due to poor performance status. Pt will need outpatient PET scan and follow up with medonc and radonc for treatment planning. Will initiate authorization process and schedule pt for further follow up once discharged from the hospital.            Time Spent with Patient: 30 (08/21/20 1000)

## 2020-08-21 NOTE — Progress Notes (Signed)
Order Requisition for Advanced Directive. Met with patient, he stated he could not see to complete form on his own. His niece whom he want to name as MPOA will not be present today. I told him about the form and where he could get it notarized outside the hospital. Patient believes he will be discharged on tomorrow and will get it completed at that time. Form has been placed in his personal belongings as requested. Provided prayer with patient prior to leaving.

## 2020-08-21 NOTE — Progress Notes (Signed)
Occupational Therapy Treatment Patient Details Name: Ruben [name] MRN: [id number] DOB: 01/19/1956 Today's Date: 08/21/2020    History of present illness 65 year old male with past medical history significant for esophageal stricture, hypertension, hyperlipidemia, COPD, stroke, GERD, depression with anxiety, CAD, history of tobacco abuse, atrial fibrillation who presents to the emergency room today for shortness of breath and chest tightness.  While in the emergency room, he received 2 duo nebs and Solu-Medrol IV with improvement of his oxygen saturations and breathing.  He was placed on BiPAP and SPO2 improved from 88% to 95%.  Lab work was fairly benign and COVID and flu were negative.  D-dimer slightly elevated prompting a CTA which showed emphysema with spiculated and enlarging right upper lobe mass most compatible with bronchogenic carcinoma.   OT comments  Upon entering the room, pt supine in bed with no c/o pain and agreeable to OT intervention. Per chart and pt report, he will be going home with oxygen. OT discussed energy conservation techniques for self care and functional mobility. OT also educating pt on wearing O2 while bathing for safety. Pt discussed home set up for safety and energy conservation as well. His cousin will be assisting him with food. He plans on having a pre-paid phone for safety at home if he needs to call for assistance. Pt verbalized understanding of education and asked appropriate questions. OT also demonstrating use of yellow resistive theraband for B UE strengthening exercises with focus on pursed lip breathing. Pt returning demonstrations with min cuing for proper technique. Pt remained in bed at end of session with all needs within reach.    Follow Up Recommendations  Home health OT;Supervision - Intermittent    Equipment Recommendations  3 in 1 bedside commode;Other (comment) (SPC)       Precautions / Restrictions Precautions Precautions: Fall        Mobility    Transfers Overall transfer level: Needs assistance Equipment used: None Transfers: Sit to/from Stand Sit to Stand: Supervision              Balance Overall balance assessment: Needs assistance Sitting-balance support: Feet supported Sitting balance-Leahy Scale: Good                                     ADL either performed or assessed with clinical judgement        Vision Patient Visual Report: No change from baseline            Cognition Arousal/Alertness: Awake/alert Behavior During Therapy: WFL for tasks assessed/performed Overall Cognitive Status: Within Functional Limits for tasks assessed                                                     Pertinent Vitals/ Pain       Pain Assessment: No/denies pain         Frequency  Min 2X/week        Progress Toward Goals  OT Goals(current goals can now be found in the care plan section)  Progress towards OT goals: Progressing toward goals  Acute Rehab OT Goals Patient Stated Goal: go home OT Goal Formulation: With patient Time For Goal Achievement: 09/03/20 Potential to Achieve Goals: Good  Plan Discharge plan remains appropriate  AM-PAC OT "6 Clicks" Daily Activity     Outcome Measure   Help from another person eating meals?: None Help from another person taking care of personal grooming?: None Help from another person toileting, which includes using toliet, bedpan, or urinal?: A Little Help from another person bathing (including washing, rinsing, drying)?: A Little Help from another person to put on and taking off regular upper body clothing?: None Help from another person to put on and taking off regular lower body clothing?: A Little 6 Click Score: 21    End of Session Equipment Utilized During Treatment: Oxygen  OT Visit Diagnosis: Unsteadiness on feet (R26.81);Muscle weakness (generalized) (M62.81)   Activity Tolerance Patient  tolerated treatment well   Patient Left in bed;with call bell/phone within reach;with bed alarm set   Nurse Communication Mobility status;Other (comment) (2 L on O2 via [place])        Time: 4935-5217 OT Time Calculation (min): 26 min  Charges: OT General Charges $OT Visit: 1 Visit OT Treatments $Self Care/Home Management : 23-37 mins  Darleen Crocker, MS, OTR/L , CBIS ascom [phone]  08/21/20, 1:00 PM

## 2020-08-21 NOTE — TOC Progression Note (Signed)
Transition of Care [facility]) - Progression Note    Patient Details  Name: Ruben [name] MRN: [id number] Date of Birth: Oct 20, 1955  Transition of [facility]) CM/SW Contact  Eileen Stanford, LCSW Phone Number: 08/21/2020, 3:25 PM  Clinical Narrative:   CSW unable to find a [place] agency to service pt. 3n1, 02, and nebulizer machine has been ordered through [employer]. Pt is also asking about a eye appointments stating he can not see. CSW has called [facility] next to [employer] in [place]. They tentively made pt a apt 5/17 at 11:15 and will check to make sure they are in contract with his insurance. If not they will call CSW.    Expected Discharge Plan: [place] Barriers to Discharge: Continued Medical Work up  Expected Discharge Plan and Services Expected Discharge Plan: [place] Choice: [place] arrangements for the past 2 months: Single Family Home                 DME Arranged: 3-N-1,Oxygen,Nebulizer machine DME Agency: [employer] Date DME Agency Contacted: 08/21/20 Time DME Agency Contacted: [phone] Representative spoke with at DME Agency: [place]: PT,RN,OT           Social Determinants of Health ([place]) Interventions    Readmission Risk Interventions No flowsheet data found.

## 2020-08-21 NOTE — Progress Notes (Signed)
[place] Room [place] [facility]) Hospital Liaison RN note:  Received new referral for [facility] out patient palliative program to follow post discharge from Quinn Axe, NP. Patient information given to referral. [facility] Hospital Liaison will follow for disposition.  Thank you for this referral.  Zandra Abts, RN [facility] Liaison [phone]

## 2020-08-21 NOTE — Progress Notes (Signed)
Palliative: [name], Ruben [name], is lying quietly in bed.  He appears acutely/chronically ill and quite frail and cachectic.  He greets me, making and mostly keeping eye contact.  He is alert and oriented, able to make his needs known.  There is no family at bedside at this time.  We talked about pulmonology visit with a goal of optimizing COPD before revisiting the idea of bronchoscopy biopsy.  Ruben [name] seems knowledgeable about his treatment options.  He continues to be agreeable to outpatient palliative services with [facility] care.  He has had their services in the past.    I reassured Ruben [name] that he has the right to decide how we take care of him.  It seems that a few years ago he had a cursory work-up for cancer, but never followed up.  Yesterday, he told me this was due to cost.  I reassure him that he can have treatment, regardless of his ability to play.  I also share that if he chooses to not seek further work-up or treatment, that is his right also.  Ruben [name] voices his gratitude for the care that he has received.  He seems to lean on his cousin, Ruben [name].   Conference with attending, bedside nursing staff, transition of care team related to patient condition, needs, goals of care, disposition.  Plan: Home with home health.  Outpatient palliative services with [facility] care.  Outpatient follow-up with pulmonary/oncology.  25 minutes Ruben Axe, NP Palliative medicine team Team phone [phone] Greater than 50% of this time was spent counseling and coordinating care related to the above assessment and plan.

## 2020-08-21 NOTE — Progress Notes (Signed)
SATURATION QUALIFICATIONS: (This note is used to comply with regulatory documentation for home oxygen)  Patient Saturations on Room Air at Rest = 92%  Patient Saturations on Room Air while Ambulating = 86%  Patient Saturations on 2 Liters of oxygen while Ambulating = 94%  Please briefly explain why patient needs home oxygen:

## 2020-08-21 NOTE — Progress Notes (Signed)
[place] at [place] NAME: Ruben [name]    MR#:  [id number]  DATE OF BIRTH:  [birth year]  SUBJECTIVE:  CHIEF COMPLAINT:   Chief Complaint  Patient presents with  . Shortness of Breath  Very appreciative of his care here.  Agreeable to do what ever needs to be done REVIEW OF SYSTEMS:  Review of Systems  Constitutional: Positive for malaise/fatigue. Negative for diaphoresis, fever and weight loss.  HENT: Negative for ear discharge, ear pain, hearing loss, nosebleeds, sore throat and tinnitus.   Eyes: Negative for blurred vision and pain.  Respiratory: Positive for cough and shortness of breath. Negative for hemoptysis and wheezing.   Cardiovascular: Negative for chest pain, palpitations, orthopnea and leg swelling.  Gastrointestinal: Negative for abdominal pain, blood in stool, constipation, diarrhea, heartburn, nausea and vomiting.  Genitourinary: Negative for dysuria, frequency and urgency.  Musculoskeletal: Negative for back pain and myalgias.  Skin: Negative for itching and rash.  Neurological: Negative for dizziness, tingling, tremors, focal weakness, seizures, weakness and headaches.  Psychiatric/Behavioral: Positive for depression. The patient is nervous/anxious.    DRUG ALLERGIES:  No Known Allergies VITALS:  Blood pressure (!) 150/79, pulse 85, temperature 97.9 F (36.6 C), temperature source Oral, resp. rate (!) 21, height 6' (1.829 m), weight 62.2 kg, SpO2 97 %. PHYSICAL EXAMINATION:  Physical Exam  65 year old cachectic, disheveled looking male lying in the bed comfortably without any acute distress.  He looks older than his stated age. Lungs Wheezing at the bases.  No accessory muscles of respiration in use.  No rhonchi or rales Cardiovascular S1-S2 normal, no murmur rales or gallop Abdomen soft, benign Extremities no pedal edema cyanosis or clubbing Neuro alert and oriented, nonfocal exam Skin no rash or lesion Psych: Normal mood  and affect LABORATORY PANEL:  Male CBC Recent Labs  Lab 08/21/20 0308  WBC 13.7*  HGB 13.2  HCT 40.0  PLT 357   ------------------------------------------------------------------------------------------------------------------ Chemistries  Recent Labs  Lab 08/19/20 0745 08/20/20 0312 08/21/20 0308  NA 135   < > 135  K 3.6   < > 4.8  CL 97*   < > 97*  CO2 26   < > 29  GLUCOSE 109*   < > 146*  BUN 10   < > 31*  CREATININE 0.65   < > 0.71  CALCIUM 9.3   < > 9.5  MG 1.9  --   --   AST 13*  --   --   ALT 6  --   --   ALKPHOS 69  --   --   BILITOT 0.8  --   --    < > = values in this interval not displayed.   RADIOLOGY:  No results found. ASSESSMENT AND PLAN:  65 year old male with a known history of esophageal stricture, COPD, stroke, GERD, depression with anxiety, CAD, lung cancer, tobacco abuse and possible atrial fibrillation is admitted for worsening shortness of breath.  Acute on chronic hypoxic respiratory failure due to COPD exacerbation with underlying lung mass Initially required BiPAP which has been weaned off.  He is on 4 L oxygen via nasal cannula at this time. Continue steroids, empiric antibiotic, incentive spirometry and Mucinex. -At discharge he will need oxygen and nebulizer machine.  He is still requiring 2 L oxygen.  Right upper lobe lung mass worrisome for cancer Appreciate pulmonary and oncology input Will likely need bronchoscopy as an outpatient if his body  can handle  Depression and depression -Continue home medications  Protein-calorie malnutrition, severe: BMI 16.8 -Consult nutritionist -Ensure  HTN (hypertension) -IV hydralazine as needed -Continue metoprolol, Cardizem, Lasix  HLD (hyperlipidemia) -Lipitor  Stroke ([place]) -Lipitor  GERD (gastroesophageal reflux disease) -Protonix  Chronic diastolic CHF (congestive heart failure) ([place]): 2D echo on 08/31/2016 showed EF 65 to 70% with grade 1 diastolic dysfunction.  BNP 69.1.   CHF seem to be well compensated. -Continue home Lasix  Tobacco abuse -Nicotine patch  PT, OT recommend home health services  Net IO Since Admission: -343 mL [08/21/20 1410]      Status is: Inpatient  Remains inpatient appropriate because:Ongoing diagnostic testing needed not appropriate for outpatient work up   Dispo: The patient is from: Home              Anticipated d/c is to: Home with home health PT, OT              Patient currently is not medically stable to d/c.   Difficult to place patient No    DVT prophylaxis:       enoxaparin (LOVENOX) injection 40 mg Start: 08/19/20 2200     Family Communication: "discussed with patient"   All the records are reviewed and case discussed with Care Management/Social Worker. Management plans discussed with the patient, nursing, oncology, pulmonary and they are in agreement.  CODE STATUS: Full Code Level of care: Progressive Cardiac  TOTAL TIME TAKING CARE OF THIS PATIENT: 35 minutes.   More than 50% of the time was spent in counseling/coordination of care: YES  POSSIBLE D/C IN 1 DAYS, DEPENDING ON CLINICAL CONDITION.   Max Sane M.D on 08/21/2020 at 2:10 PM  [facility]   CC: Primary care physician; Ranae Plumber, PA  Note: This dictation was prepared with Dragon dictation along with smaller phrase technology. Any transcriptional errors that result from this process are unintentional.

## 2020-08-22 MED ORDER — MELATONIN 5 MG PO TABS
5.0000 mg | ORAL_TABLET | Freq: Every evening | ORAL | Status: DC | PRN
  Administered 2020-08-22: 5 mg via ORAL
  Filled 2020-08-22: qty 1

## 2020-08-22 NOTE — Progress Notes (Signed)
PT Cancellation Note  Patient Details Name: Ruben [name] MRN: [id number] DOB: [birth year]   Cancelled Treatment:     PT attempt. Pt refused. " I'm just too tired right now but thanks for trying." Pt politely refused requesting therapist return a later date." I'm going home tomorrow." Encouraged OOB activity however pt unwilling.     Willette Pa 08/22/2020, 2:18 PM

## 2020-08-22 NOTE — Progress Notes (Signed)
Occupational Therapy Treatment Patient Details Name: Ruben [name] MRN: [id number] DOB: Apr 14, 1955 Today's Date: 08/22/2020    History of present illness 65 year old male with past medical history significant for esophageal stricture, hypertension, hyperlipidemia, COPD, stroke, GERD, depression with anxiety, CAD, history of tobacco abuse, atrial fibrillation who presents to the emergency room today for shortness of breath and chest tightness.  While in the emergency room, he received 2 duo nebs and Solu-Medrol IV with improvement of his oxygen saturations and breathing.  He was placed on BiPAP and SPO2 improved from 88% to 95%.  Lab work was fairly benign and COVID and flu were negative.  D-dimer slightly elevated prompting a CTA which showed emphysema with spiculated and enlarging right upper lobe mass most compatible with bronchogenic carcinoma.   OT comments  Upon entering the room, pt supine in bed and reports not sleeping well last night. Pt is agreeable to OT intervention and very pleasant. When asked to exit bed for functional mobility/transfers pt reports , " I just don't feel well but I will try later with you". OT provided pt with wash cloth and he washed face and hair while in long sitting with set up A. Pt combing hair and beard with set up A to obtain needed items. Pt reports skin being itchy and very dry. OT assisted pt with applying lotion to back and applied to B UEs without assistance. Pt very thankful and appreciative of staff assistance. Pt requesting to rest at this time and returns to supine. All need within reach and bed alarm activated.   Follow Up Recommendations  Home health OT;Supervision - Intermittent    Equipment Recommendations  3 in 1 bedside commode;Other (comment) (SPC)       Precautions / Restrictions Precautions Precautions: Fall       Mobility Bed Mobility Overal bed mobility: Modified Independent Bed Mobility: Rolling Rolling: Modified independent  (Device/Increase time)         General bed mobility comments: no physical assist for bed mobility       Balance Overall balance assessment: Independent Sitting-balance support: Feet supported Sitting balance-Leahy Scale: Good                                     ADL either performed or assessed with clinical judgement   ADL Overall ADL's : Needs assistance/impaired     Grooming: Wash/dry hands;Wash/dry face;Oral care;Supervision/safety;Sitting;Set up Grooming Details (indicate cue type and reason): lomg sitting in bed                                     Vision Patient Visual Report: No change from baseline            Cognition Arousal/Alertness: Awake/alert Behavior During Therapy: WFL for tasks assessed/performed Overall Cognitive Status: Within Functional Limits for tasks assessed                                                     Pertinent Vitals/ Pain       Pain Assessment: No/denies pain  Home Living Family/patient expects to be discharged to:: Private residence Living Arrangements: Alone Available Help at Discharge: Family;Available PRN/intermittently Type of Home: House  Home Access: Stairs to enter Entrance Stairs-Number of Steps: 2                                  Frequency  Min 2X/week        Progress Toward Goals  OT Goals(current goals can now be found in the care plan section)  Progress towards OT goals: Progressing toward goals  Acute Rehab OT Goals Patient Stated Goal: go home OT Goal Formulation: With patient Time For Goal Achievement: 09/03/20 Potential to Achieve Goals: Good  Plan Discharge plan remains appropriate;Frequency remains appropriate       AM-PAC OT "6 Clicks" Daily Activity     Outcome Measure   Help from another person eating meals?: None Help from another person taking care of personal grooming?: None Help from another person toileting, which  includes using toliet, bedpan, or urinal?: A Little Help from another person bathing (including washing, rinsing, drying)?: A Little Help from another person to put on and taking off regular upper body clothing?: None Help from another person to put on and taking off regular lower body clothing?: A Little 6 Click Score: 21    End of Session Equipment Utilized During Treatment: Oxygen  OT Visit Diagnosis: Unsteadiness on feet (R26.81);Muscle weakness (generalized) (M62.81)   Activity Tolerance Patient tolerated treatment well   Patient Left in bed;with call bell/phone within reach;with bed alarm set   Nurse Communication          Time: 0762-2633 OT Time Calculation (min): 23 min  Charges: OT General Charges $OT Visit: 1 Visit OT Treatments $Self Care/Home Management : 23-37 mins  Darleen Crocker, MS, OTR/L , CBIS ascom [phone]  08/22/20, 1:40 PM

## 2020-08-22 NOTE — Progress Notes (Signed)
[place] at [place] NAME: Ruben [name]    MR#:  [id number]  DATE OF BIRTH:  1956/03/19  SUBJECTIVE:  CHIEF COMPLAINT:   Chief Complaint  Patient presents with  . Shortness of Breath  Not had a good night, could not sleep well.  Not feeling well and strong enough to go home today. REVIEW OF SYSTEMS:  Review of Systems  Constitutional: Positive for malaise/fatigue. Negative for diaphoresis, fever and weight loss.  HENT: Negative for ear discharge, ear pain, hearing loss, nosebleeds, sore throat and tinnitus.   Eyes: Negative for blurred vision and pain.  Respiratory: Positive for cough and shortness of breath. Negative for hemoptysis and wheezing.   Cardiovascular: Negative for chest pain, palpitations, orthopnea and leg swelling.  Gastrointestinal: Negative for abdominal pain, blood in stool, constipation, diarrhea, heartburn, nausea and vomiting.  Genitourinary: Negative for dysuria, frequency and urgency.  Musculoskeletal: Negative for back pain and myalgias.  Skin: Negative for itching and rash.  Neurological: Negative for dizziness, tingling, tremors, focal weakness, seizures, weakness and headaches.  Psychiatric/Behavioral: Positive for depression. The patient is nervous/anxious.    DRUG ALLERGIES:  No Known Allergies VITALS:  Blood pressure (!) 118/59, pulse 66, temperature 98 F (36.7 C), temperature source Oral, resp. rate 18, height 6' (1.829 m), weight 62.3 kg, SpO2 97 %. PHYSICAL EXAMINATION:  Physical Exam  65 year old cachectic, disheveled looking male lying in the bed comfortably without any acute distress.  He looks older than his stated age. Lungs Wheezing at the bases.  No accessory muscles of respiration in use.  No rhonchi or rales Cardiovascular S1-S2 normal, no murmur rales or gallop Abdomen soft, benign Extremities no pedal edema cyanosis or clubbing Neuro alert and oriented, nonfocal exam Skin no rash or lesion Psych:  Normal mood and affect LABORATORY PANEL:  Male CBC Recent Labs  Lab 08/21/20 0308  WBC 13.7*  HGB 13.2  HCT 40.0  PLT 357   ------------------------------------------------------------------------------------------------------------------ Chemistries  Recent Labs  Lab 08/19/20 0745 08/20/20 0312 08/21/20 0308  NA 135   < > 135  K 3.6   < > 4.8  CL 97*   < > 97*  CO2 26   < > 29  GLUCOSE 109*   < > 146*  BUN 10   < > 31*  CREATININE 0.65   < > 0.71  CALCIUM 9.3   < > 9.5  MG 1.9  --   --   AST 13*  --   --   ALT 6  --   --   ALKPHOS 69  --   --   BILITOT 0.8  --   --    < > = values in this interval not displayed.   RADIOLOGY:  No results found. ASSESSMENT AND PLAN:  65 year old male with a known history of esophageal stricture, COPD, stroke, GERD, depression with anxiety, CAD, lung cancer, tobacco abuse and possible atrial fibrillation is admitted for worsening shortness of breath.  Acute on chronic hypoxic respiratory failure due to COPD exacerbation with underlying lung mass Initially required BiPAP which has been weaned off.  He is on 3 L oxygen via nasal cannula at this time. Continue steroids, empiric antibiotic, incentive spirometry and Mucinex. -At discharge he will need oxygen and nebulizer machine.  He is still requiring 2 L oxygen.  Right upper lobe lung mass worrisome for cancer Appreciate pulmonary and oncology input Will likely need bronchoscopy as an outpatient  if his body can handle  Depression and depression -Continue home medications  Protein-calorie malnutrition, severe: BMI 16.8 -Consult nutritionist -Ensure  HTN (hypertension) -IV hydralazine as needed -Continue metoprolol, Cardizem, Lasix  HLD (hyperlipidemia) -Lipitor  Stroke ([name]) -Lipitor  GERD (gastroesophageal reflux disease) -Protonix  Chronic diastolic CHF (congestive heart failure) ([name]): 2D echo on 08/31/2016 showed EF 65 to 70% with grade 1 diastolic dysfunction.   BNP 69.1.  CHF seem to be well compensated. -Continue home Lasix  Tobacco abuse -Nicotine patch  PT, OT recommend home health services -TOC aware  Net IO Since Admission: -1,108 mL [08/22/20 1530]      Status is: Inpatient  Remains inpatient appropriate because:Ongoing diagnostic testing needed not appropriate for outpatient work up   Dispo: The patient is from: Home              Anticipated d/c is to: Home with home health PT, OT              Patient currently is not medically stable to d/c.  Feels very weak and coughing more   Difficult to place patient No    DVT prophylaxis:       enoxaparin (LOVENOX) injection 40 mg Start: 08/19/20 2200     Family Communication: "discussed with patient"   All the records are reviewed and case discussed with Care Management/Social Worker. Management plans discussed with the patient, nursing, oncology, pulmonary and they are in agreement.  CODE STATUS: Full Code Level of care: Progressive Cardiac  TOTAL TIME TAKING CARE OF THIS PATIENT: 35 minutes.   More than 50% of the time was spent in counseling/coordination of care: YES  POSSIBLE D/C IN 1 DAYS, DEPENDING ON CLINICAL CONDITION.   Max Sane M.D on 08/22/2020 at 3:30 PM  [facility]   CC: Primary care physician; Ranae Plumber, PA  Note: This dictation was prepared with Dragon dictation along with smaller phrase technology. Any transcriptional errors that result from this process are unintentional.

## 2020-08-23 ENCOUNTER — Inpatient Hospital Stay: Payer: Medicaid Other

## 2020-08-23 DIAGNOSIS — R079 Chest pain, unspecified: Secondary | ICD-10-CM

## 2020-08-23 LAB — BASIC METABOLIC PANEL
Anion gap: 8 (ref 5–15)
BUN: 32 mg/dL — ABNORMAL HIGH (ref 8–23)
CO2: 40 mmol/L — ABNORMAL HIGH (ref 22–32)
Calcium: 9.9 mg/dL (ref 8.9–10.3)
Chloride: 87 mmol/L — ABNORMAL LOW (ref 98–111)
Creatinine, Ser: 0.69 mg/dL (ref 0.61–1.24)
GFR, Estimated: >60 mL/min (ref >60)
Glucose, Bld: 155 mg/dL — ABNORMAL HIGH (ref 70–99)
Potassium: 4.4 mmol/L (ref 3.5–5.1)
Sodium: 135 mmol/L (ref 135–145)

## 2020-08-23 LAB — CBC
HCT: 43.6 % (ref 39.0–52.0)
Hemoglobin: 14.4 g/dL (ref 13.0–17.0)
MCH: 27.8 pg (ref 26.0–34.0)
MCHC: 33 g/dL (ref 30.0–36.0)
MCV: 84.2 fL (ref 80.0–100.0)
Platelets: 369 10*3/uL (ref 150–400)
RBC: 5.18 MIL/uL (ref 4.22–5.81)
RDW: 13.2 % (ref 11.5–15.5)
WBC: 13.9 10*3/uL — ABNORMAL HIGH (ref 4.0–10.5)
nRBC: 0 % (ref 0.0–0.2)

## 2020-08-23 LAB — MAGNESIUM: Magnesium: 2 mg/dL (ref 1.7–2.4)

## 2020-08-23 LAB — TROPONIN I (HIGH SENSITIVITY)
Troponin I (High Sensitivity): 3 ng/L (ref <18)
Troponin I (High Sensitivity): 4 ng/L (ref <18)
Troponin I (High Sensitivity): 4 ng/L (ref <18)

## 2020-08-23 MED ORDER — GLYCOPYRROLATE 0.2 MG/ML IJ SOLN
0.2000 mg | INTRAMUSCULAR | Status: DC | PRN
  Filled 2020-08-23: qty 1

## 2020-08-23 MED ORDER — LORAZEPAM 2 MG/ML IJ SOLN: 2.0000 mg | INTRAMUSCULAR | Status: DC | PRN

## 2020-08-23 MED ORDER — ALUM & MAG HYDROXIDE-SIMETH 200-200-20 MG/5ML PO SUSP
30.0000 mL | Freq: Four times a day (QID) | ORAL | Status: DC | PRN
  Administered 2020-08-23: 30 mL via ORAL
  Filled 2020-08-23: qty 30

## 2020-08-23 MED ORDER — IRBESARTAN 150 MG PO TABS
75.0000 mg | ORAL_TABLET | Freq: Every day | ORAL | Status: DC
  Administered 2020-08-23: 75 mg via ORAL
  Filled 2020-08-23: qty 1

## 2020-08-23 MED ORDER — CARVEDILOL 3.125 MG PO TABS: 3.1250 mg | ORAL_TABLET | Freq: Two times a day (BID) | ORAL | Status: DC

## 2020-08-23 MED ORDER — HALOPERIDOL LACTATE 5 MG/ML IJ SOLN: 2.5000 mg | INTRAMUSCULAR | Status: DC | PRN

## 2020-08-23 MED ORDER — DIPHENHYDRAMINE HCL 50 MG/ML IJ SOLN: 25.0000 mg | INTRAMUSCULAR | Status: DC | PRN

## 2020-08-23 MED ORDER — MORPHINE SULFATE (PF) 2 MG/ML IV SOLN
2.0000 mg | INTRAVENOUS | Status: DC | PRN
  Administered 2020-08-23 – 2020-08-24 (×3): 2 mg via INTRAVENOUS
  Filled 2020-08-23 (×3): qty 1

## 2020-08-23 MED ORDER — POLYVINYL ALCOHOL 1.4 % OP SOLN
1.0000 [drp] | Freq: Four times a day (QID) | OPHTHALMIC | Status: DC | PRN
  Filled 2020-08-23: qty 15

## 2020-08-23 MED ORDER — FUROSEMIDE 10 MG/ML IJ SOLN
40.0000 mg | INTRAMUSCULAR | Status: AC
  Administered 2020-08-23: 40 mg via INTRAVENOUS
  Filled 2020-08-23: qty 4

## 2020-08-23 MED ORDER — AMLODIPINE BESYLATE 10 MG PO TABS
10.0000 mg | ORAL_TABLET | Freq: Every day | ORAL | Status: DC
  Administered 2020-08-23: 10 mg via ORAL
  Filled 2020-08-23: qty 1

## 2020-08-23 MED ORDER — ISOSORBIDE MONONITRATE ER 30 MG PO TB24
30.0000 mg | ORAL_TABLET | Freq: Every day | ORAL | Status: DC
  Administered 2020-08-23: 30 mg via ORAL
  Filled 2020-08-23: qty 1

## 2020-08-23 MED ORDER — ACETAMINOPHEN 325 MG PO TABS: 650.0000 mg | ORAL_TABLET | Freq: Four times a day (QID) | ORAL | Status: DC | PRN

## 2020-08-23 MED ORDER — CARVEDILOL 6.25 MG PO TABS: 6.2500 mg | ORAL_TABLET | Freq: Two times a day (BID) | ORAL | Status: DC

## 2020-08-23 MED ORDER — DEXTROSE 5 % IV SOLN: INTRAVENOUS | Status: DC

## 2020-08-23 MED ORDER — ACETAMINOPHEN 650 MG RE SUPP: 650.0000 mg | Freq: Four times a day (QID) | RECTAL | Status: DC | PRN

## 2020-08-23 MED ORDER — GLYCOPYRROLATE 1 MG PO TABS
1.0000 mg | ORAL_TABLET | ORAL | Status: DC | PRN
  Filled 2020-08-23: qty 1

## 2020-08-23 NOTE — Consult Note (Addendum)
Cardiology Consultation:   Patient ID: Ruben [name] MRN: [id number]; DOB: 1955-05-12  Admit date: 08/19/2020 Date of Consult: 08/23/2020  PCP:  Ranae Plumber, [place]  Cardiologist: [facility], Dr. Curt Bears rounding Advanced Practice Provider:  No care team member to display Electrophysiologist:  None     Patient Profile:   Ruben [name] is a 65 y.o. male with a hx of CAD, lung cancer, COPD, tobacco use, CVA, history of documented atrial fibrillation with RVR s/p short term anticoagulation, GERD, esophageal stricture, depression/anxiety and who is being seen today for the evaluation of chest pain at the request of Dr. Manuella Ghazi.  History of Present Illness:   Ruben [name] is a frail 65 year old male with PMH as above.  Unfortunately, history is limited by patient's current mental status/acuity of condition and most of the below history obtained via chart biopsy and from the patient's current treatment team.  He has history of CAD; however, on review of EMR, I am unable to locate previous cardiac records other than a 2018 echo that shows EF 65 to 70%, mild concentric hypertrophy, NR WMA, G1 DD, mild RVE, and a pericardial effusion.  I am unable to obtain a history verbally from him to find if he regularly sees a cardiologist.  Afib was reportedly seen in 2018, when evaluated by [facility] cardiology. It was recommended at that time he continue PPI, quit smoking, and consider short term anticoagulation.   He has a history of right upper lobe lung mass and currently undergoing oncology/pulmonology work-up with need for bronchoscopy as an outpatient if tolerated. He has also has history of COPD with current tobacco use.    On 08/19/2020, he presented to the [facility] emergency room with report of 2 to 3 days of worsening shortness of breath and dry/unproductive cough.  He reported some discomfort of his upper back.  In the emergency department, he was noted to be in  respiratory distress with significant accessory muscle use and diminished breath sounds bilaterally with wheezing.  He was hypertensive with BP 189/89 and tachycardic at 103 bpm.  Oxygen saturations initially 88%.  He received duo nebs, IV Solu-Medro, and was placed on BiPAP with subsequent oxygen saturation improvement to 95% and improvement in symptoms.  Respiratory panel negative.  Initial EKG significant for sinus tachycardia with right atrial enlargement, ventricular rate 101 bpm, poor R wave progression leads V1 to V3, LVH. D-dimer slightly elevated with CTA negative for pulmonary embolism and showing emphysema and spiculated/enlarging right upper lobe mass most compatible with bronchogenic carcinoma.  Additional small lung nodules were noted to be stable since 2019 and benign.  A chronic T8 compression fracture was noted, as well as aortic atherosclerosis.  Chest x-ray showed a suspected degree of underlying chronic bronchitis.  Bilateral LE studies negative for bilateral DVT as well.  Initial labs significant for hypokalemia with 3.6, mildly elevated Ddimer as above at 0.70, creatinine 0.65, BUN 10, AST 13, ALT 6, BNP 69.6, high-sensitivity troponin 7, hemoglobin 14.2, hematocrit 42.9.  Home medications include digoxin 0.25 mg daily with initial digoxin level <0.2. He was admitted with oncology consulted and plans for treatment of COPD exacerbation.  Today, 08/23/2020, cardiology is consulted for patient report of sudden chest discomfort and diaphoresis.  Unfortunately, he is unable to provide any history at the time of consultation.  He tells me that he has never had chest pain like this before in the past.  He is intermittently burping on exam  and also noted to be frequently drooling.  Telemetry is attached with rates in the 50s and NSR.  EKG obtained and NSR without acute abnormalities.  He is frequently asking for help but not providing much medical history.  BMET, CBC, magnesium, and troponin labs  ordered, as well as echo.  Past Medical History:  Diagnosis Date  . Cancer ([place])    lung cancer  . CHF (congestive heart failure) ([place])   . COPD (chronic obstructive pulmonary disease) ([place])   . Coronary artery disease   . Depression   . Food impaction of esophagus   . Food impaction of esophagus   . Food impaction of esophagus   . Hypertension   . Myocardial infarction ([place])   . Stroke [facility])     Past Surgical History:  Procedure Laterality Date  . CARDIAC CATHETERIZATION    . COLONOSCOPY    . ELBOW SURGERY Left   . ESOPHAGOGASTRODUODENOSCOPY N/A 01/31/2019   Procedure: ESOPHAGOGASTRODUODENOSCOPY (EGD);  Surgeon: Toledo, Benay Pike, MD;  Location: [facility] ENDOSCOPY;  Service: Gastroenterology;  Laterality: N/A;  . ESOPHAGOGASTRODUODENOSCOPY (EGD) WITH PROPOFOL N/A 03/30/2019   Procedure: ESOPHAGOGASTRODUODENOSCOPY (EGD) WITH PROPOFOL;  Surgeon: Lin Landsman, MD;  Location: [facility] ENDOSCOPY;  Service: Gastroenterology;  Laterality: N/A;  . ESOPHAGOGASTRODUODENOSCOPY (EGD) WITH PROPOFOL N/A 02/22/2020   Procedure: ESOPHAGOGASTRODUODENOSCOPY (EGD) WITH PROPOFOL;  Surgeon: Jonathon Bellows, MD;  Location: [facility] ENDOSCOPY;  Service: Gastroenterology;  Laterality: N/A;  . FLEXIBLE BRONCHOSCOPY Bilateral 08/27/2016   Procedure: FLEXIBLE BRONCHOSCOPY;  Surgeon: Allyne Gee, MD;  Location: [facility] ORS;  Service: Pulmonary;  Laterality: Bilateral;  . WRIST SURGERY Left      Home Medications:  Prior to Admission medications   Medication Sig Start Date End Date Taking? Authorizing Provider  albuterol (PROVENTIL HFA;VENTOLIN HFA) 108 (90 Base) MCG/ACT inhaler Inhale 2 puffs into the lungs every 6 (six) hours as needed for wheezing or shortness of breath. 02/19/18  Yes Epifanio Lesches, MD  atorvastatin (LIPITOR) 20 MG tablet Take 1 tablet by mouth at bedtime. 05/03/20  Yes [provider]  diazepam (VALIUM) 10 MG tablet Take 10 mg by mouth 2 (two) times daily. 07/28/20  Yes [provider]  digoxin (LANOXIN) 0.25 MG tablet Take 1 tablet (0.25 mg total) by mouth daily. 02/19/18  Yes Epifanio Lesches, MD  diltiazem (CARDIZEM CD) 300 MG 24 hr capsule Take 1 capsule (300 mg total) by mouth daily. 02/03/19  Yes Ojie, Jude, MD  ipratropium-albuterol (DUONEB) 0.5-2.5 (3) MG/3ML SOLN Take 3 mLs by nebulization every 4 (four) hours as needed (shortness of breath).   Yes [provider]  metoprolol tartrate (LOPRESSOR) 25 MG tablet Take 1 tablet (25 mg total) by mouth 2 (two) times daily. 02/19/18  Yes Epifanio Lesches, MD  atorvastatin (LIPITOR) 10 MG tablet Take 1 tablet (10 mg total) by mouth daily. Patient not taking: No sig reported 02/20/18   Epifanio Lesches, MD  diazepam (VALIUM) 5 MG tablet Take 1 tablet (5 mg total) by mouth 2 (two) times daily. Do not drive or operate machinery while on medication. Patient not taking: No sig reported 02/03/19   Otila Back, MD  furosemide (LASIX) 20 MG tablet Take 1 tablet (20 mg total) by mouth daily. 04/28/18 04/28/19  Hillary Bow, MD  mometasone-formoterol (DULERA) 200-5 MCG/ACT AERO Inhale 2 puffs into the lungs 2 (two) times daily. Patient not taking: No sig reported 02/19/18   Epifanio Lesches, MD  Multiple Vitamin (MULTIVITAMIN WITH MINERALS) TABS tablet Take 1 tablet by mouth  daily. Patient not taking: No sig reported 02/04/19   Stark Jock, Jude, MD  nicotine (NICODERM CQ - DOSED IN MG/24 HOURS) 21 mg/24hr patch Place 1 patch (21 mg total) onto the skin daily. 02/04/19   Stark Jock Jude, MD  omeprazole (PRILOSEC) 40 MG capsule Take 1 capsule (40 mg total) by mouth 2 (two) times daily before a meal. 03/30/19 04/29/19  Vanga, Tally Due, MD  pantoprazole (PROTONIX) 40 MG tablet Take 1 tablet (40 mg total) by mouth daily. Patient not taking: Reported on 08/19/2020 02/04/19   Otila Back, MD  senna-docusate (SENOKOT-S) 8.6-50 MG tablet Take 1 tablet by mouth at bedtime as needed for mild constipation. Patient not  taking: No sig reported 02/03/19   Otila Back, MD  sertraline (ZOLOFT) 100 MG tablet Take 100 mg by mouth daily.  Patient not taking: No sig reported    [provider]    Inpatient Medications: Scheduled Meds: . atorvastatin  20 mg Oral QHS  . diazepam  10 mg Oral BID  . digoxin  0.25 mg Oral Daily  . diltiazem  300 mg Oral Daily  . enoxaparin (LOVENOX) injection  40 mg Subcutaneous Q24H  . feeding supplement  237 mL Oral TID BM  . furosemide  20 mg Oral Daily  . ipratropium-albuterol  3 mL Nebulization Q6H  . metoprolol tartrate  25 mg Oral BID  . multivitamin with minerals  1 tablet Oral Daily  . nicotine  21 mg Transdermal Daily  . pantoprazole  40 mg Oral BID  . predniSONE  50 mg Oral Q breakfast   Continuous Infusions:  PRN Meds: acetaminophen, albuterol, alum & mag hydroxide-simeth, dextromethorphan-guaiFENesin, hydrALAZINE, HYDROcodone-acetaminophen, melatonin, nitroGLYCERIN, ondansetron (ZOFRAN) IV  Allergies:   No Known Allergies  Social History:   Social History   Socioeconomic History  . Marital status: Legally Separated    Spouse name: Not on file  . Number of children: Not on file  . Years of education: Not on file  . Highest education level: Not on file  Occupational History  . Occupation: unemployed  Tobacco Use  . Smoking status: Current Every Day Smoker    Packs/day: 1.00    Years: 43.00    Pack years: 43.00    Types: Cigarettes  . Smokeless tobacco: Never Used  Vaping Use  . Vaping Use: Never used  Substance and Sexual Activity  . Alcohol use: No  . Drug use: Yes    Frequency: 4.0 times per week    Types: Marijuana  . Sexual activity: Yes  Other Topics Concern  . Not on file  Social History Narrative   Independent at baseline.   Social Determinants of Health   Financial Resource Strain: Not on file  Food Insecurity: Not on file  Transportation Needs: Not on file  Physical Activity: Not on file  Stress: Not on file  Social  Connections: Not on file  Intimate Partner Violence: Not on file    Family History:     Family History  Problem Relation Age of Onset  . Heart disease Other   . Hypertension Other   . Diabetes Other   . CAD Mother      ROS:  Please see the history of present illness.  Review of Systems  Unable to perform ROS: Severity of pain  Constitutional: Positive for diaphoresis.  Cardiovascular: Positive for chest pain.    All other ROS reviewed and negative.     Physical Exam/Data:   Vitals:   08/23/20 0342 08/23/20  0349 08/23/20 0823 08/23/20 0834  BP: 125/61 140/64 (!) 190/79 (!) 188/78  Pulse: 79 66 63 (!) 59  Resp: _0 Temp: 98.1 F (36.7 C) 97.9 F (36.6 C) 97.7 F (36.5 C)   TempSrc: Oral Oral    SpO2: 98% 97% 100% 99%  Weight: 64.2 kg     Height:        Intake/Output Summary (Last 24 hours) at 08/23/2020 0847 Last data filed at 08/23/2020 0400 Gross per 24 hour  Intake 720 ml  Output 1300 ml  Net -580 ml   Last 3 Weights 08/23/2020 08/22/2020 08/21/2020  Weight (lbs) 141 lb 9.6 oz 137 lb 4.8 oz 137 lb 1.6 oz  Weight (kg) 64.229 kg 62.279 kg 62.188 kg     Body mass index is 19.2 kg/[name].  General: Frail and elderly male, cachetic, grabbing his chest, appears anxious HEENT: normal Neck: Unable to assess JVD due to position of patient Vascular: Radial pulses 1+ bilaterally  Cardiac:  Faint heart sounds with patient panting, difficult cardiac exam, bradycardic and regular without clear murmurs with consideration of lung sounds Lungs: Unable to follow instruction during pulmonary auscultation, reduced breath sounds bilaterally Abd: Thin, nontender, no hepatomegaly  Ext: no edema Musculoskeletal:  No deformities, Skin: warm and dry  Neuro: no focal abnormalities noted Psych: Anxious appearing  EKG:  The EKG was personally reviewed and demonstrates: NSR without acute ST/T changes. Subsequent EKG SB without acute ST/T changes Telemetry:  Telemetry was personally  reviewed and demonstrates:  NSR-SB  Relevant CV Studies: Echo 2018 Left ventricle: The cavity size was normal. Wall thickness was  increased in a pattern of mild LVH. There was mild concentric  hypertrophy. Systolic function was vigorous. The estimated  ejection fraction was in the range of 65% to 70%. Wall motion was  normal; there were no regional wall motion abnormalities. Doppler  parameters are consistent with abnormal left ventricular  relaxation (grade 1 diastolic dysfunction).  - Aortic valve: Valve area (Vmax): 2.4 cm^2.  - Right ventricle: The cavity size was mildly dilated. Wall  thickness was normal.  - Pericardium, extracardiac: A pericardial effusion was identified.   Laboratory Data:  High Sensitivity Troponin:   Recent Labs  Lab 08/19/20 0745  TROPONINIHS 7     Chemistry Recent Labs  Lab 08/19/20 0745 08/20/20 0312 08/21/20 0308  NA 135 132* 135  K 3.6 4.8 4.8  CL 97* 97* 97*  CO2 _1 GLUCOSE 109* 164* 146*  BUN 10 17 31*  CREATININE 0.65 0.63 0.71  CALCIUM 9.3 9.3 9.5  GFRNONAA >60 >60 >60  ANIONGAP _2 Recent Labs  Lab 08/19/20 0745  PROT 8.0  ALBUMIN 3.9  AST 13*  ALT 6  ALKPHOS 69  BILITOT 0.8   Hematology Recent Labs  Lab 08/19/20 0745 08/20/20 0312 08/21/20 0308  WBC 9.6 9.9 13.7*  RBC 5.21 4.69 4.85  HGB 14.2 13.0 13.2  HCT 42.9 38.3* 40.0  MCV 82.3 81.7 82.5  MCH 27.3 27.7 27.2  MCHC 33.1 33.9 33.0  RDW 13.7 13.4 13.5  PLT 281 258 357   BNP Recent Labs  Lab 08/19/20 0745  BNP 69.6    DDimer  Recent Labs  Lab 08/19/20 0753  DDIMER 0.70*     Radiology/Studies:  CT Angio Chest PE W and/or Wo Contrast  Result Date: 08/19/2020 CLINICAL DATA:  65 year old male with progressive shortness of breath over the last few days. No  improvement with breathing treatments. EXAM: CT ANGIOGRAPHY CHEST WITH CONTRAST TECHNIQUE: Multidetector CT imaging of the chest was performed using the standard  protocol during bolus administration of intravenous contrast. Multiplanar CT image reconstructions and MIPs were obtained to evaluate the vascular anatomy. CONTRAST:  38m OMNIPAQUE IOHEXOL 350 MG/ML SOLN COMPARISON:  Portable chest 0750 hours today. CTA chest 05/30/2017. two-view chest radiographs 03/30/2019. FINDINGS: Cardiovascular: Adequate contrast bolus timing in the pulmonary arterial tree. No focal filling defect identified in the pulmonary arteries to suggest acute pulmonary embolism. Calcified aortic atherosclerosis. No cardiomegaly or pericardial effusion. Mediastinum/Nodes: Stable small mediastinal lymph nodes. Stable hilar nodes. No lymphadenopathy. Lungs/Pleura: Emphysema. Round and spiculated posterior right upper lobe lung nodule has increased from roughly 10 mm in 2019 2 up to 23 mm long axis now (17 mm on series 7, image 21). And there is an adjacent small new 6 mm nodule in an area of chronic emphysema change on series 7, image 19. Stable small spiculated right upper lobe area on series 7, image 32 suggesting benign etiology there. And stable vague nodularity in the left lower lobe on image 76. No pleural effusion or acute infectious opacity identified. Upper Abdomen: Negative visible liver, spleen, pancreas, adrenal glands, kidneys and bowel in the upper abdomen. Musculoskeletal: Mild to moderate T8 superior endplate compression fracture is new since 2019. This was mild in 2020. No retropulsion or complicating features. Other visible osseous structures appear stable. Review of the MIP images confirms the above findings. IMPRESSION: 1. Negative for acute pulmonary embolus. 2. Emphysema (ICD10-J43.9) with spiculated and enlarging right upper lobe mass (series 7, image 21) most compatible with BRONCHOGENIC CARCINOMA. [facility]. 3. No other acute pulmonary finding. Several additional small lung nodules are stable since 2019 and benign  (images 32 and 76). 4. Chronic T8 compression fracture. Aortic Atherosclerosis (ICD10-I70.0). Electronically Signed   By: HGenevie AnnM.D.   On: 08/19/2020 09:58   UKoreaVenous Img Lower Bilateral (DVT)  Result Date: 08/20/2020 CLINICAL DATA:  Positive D-dimer, tobacco use, lung cancer EXAM: BILATERAL LOWER EXTREMITY VENOUS DOPPLER ULTRASOUND TECHNIQUE: Gray-scale sonography with graded compression, as well as color Doppler and duplex ultrasound were performed to evaluate the lower extremity deep venous systems from the level of the common femoral vein and including the common femoral, femoral, profunda femoral, popliteal and calf veins including the posterior tibial, peroneal and gastrocnemius veins when visible. The superficial great saphenous vein was also interrogated. Spectral Doppler was utilized to evaluate flow at rest and with distal augmentation maneuvers in the common femoral, femoral and popliteal veins. COMPARISON:  None. FINDINGS: RIGHT LOWER EXTREMITY Common Femoral Vein: No evidence of thrombus. Normal compressibility, respiratory phasicity and response to augmentation. Saphenofemoral Junction: No evidence of thrombus. Normal compressibility and flow on color Doppler imaging. Profunda Femoral Vein: No evidence of thrombus. Normal compressibility and flow on color Doppler imaging. Femoral Vein: No evidence of thrombus. Normal compressibility, respiratory phasicity and response to augmentation. Popliteal Vein: No evidence of thrombus. Normal compressibility, respiratory phasicity and response to augmentation. Calf Veins: No evidence of thrombus. Normal compressibility and flow on color Doppler imaging. LEFT LOWER EXTREMITY Common Femoral Vein: No evidence of thrombus. Normal compressibility, respiratory phasicity and response to augmentation. Saphenofemoral Junction: No evidence of thrombus. Normal compressibility and flow on color Doppler imaging. Profunda Femoral Vein: No evidence of thrombus. Normal  compressibility and flow on color Doppler imaging. Femoral Vein: No evidence of thrombus. Normal compressibility, respiratory phasicity and response to  augmentation. Popliteal Vein: No evidence of thrombus. Normal compressibility, respiratory phasicity and response to augmentation. Calf Veins: No evidence of thrombus. Normal compressibility and flow on color Doppler imaging. IMPRESSION: No evidence of deep venous thrombosis in either lower extremity. Electronically Signed   By: Jerilynn Mages.  Shick [name].D.   On: 08/20/2020 08:04   DG Chest Port 1 View  Result Date: 08/23/2020 CLINICAL DATA:  Shortness of breath.  History of lung cancer EXAM: PORTABLE CHEST 1 VIEW COMPARISON:  Chest CT 08/19/2020 FINDINGS: Generalized interstitial coarsening which is chronic. Right upper lobe nodule which is known. There is no edema, consolidation, effusion, or pneumothorax. Normal heart size and mediastinal contours. IMPRESSION: 1. No acute finding when compared to prior. 2. COPD and right upper lobe nodule. Electronically Signed   By: Monte Fantasia [name].D.   On: 08/23/2020 08:26     Assessment and Plan:   Chest pain Documented history of CAD (details unclear) --Current chest pain that patient reports he has not experienced before in the past.  EMR reports previous history of CAD; however, I am unable to find further details on review of EMR.  Today's EKG is without acute ST/T changes. HS Tn pending.  Initial HS Tn at admission negative.   Considered was PE with sinus tachycardia at presentation and hypoxia.  However, 08/19/2020 D-dimer mildly elevated with subsequent CTA without evidence of PE and bilateral lower extremity ultrasound without evidence of DVT. Considered are his comorbid conditions, including esophageal stricture/GERD with plan to administer antinausea medication at this time.  Also considered is his underlying lung mass -suspect this is contributing to his symptoms, as well as his level of anxiety.  Less suspicious  for ACS at this time; however, Ruben [name] order echo for further RF stratification and consider further ischemic workup pending establishment of goals of care if EF reduced or WMA.  No plan for emergent LHC.    No indication for IV heparin at this time.    HS Tn ordered - cycle until peaked, down-trending.  EKGs. Monitor on telemetry.   Echo ordered, pending. R/o acute structural changes.  Continue medical management. Titrate as HR allows for BP control and antianginal effect.  Changed Lopressor to Coreg for additional BP support.  Continue PRN SL nitro for CP.    Imdur added for antianginal effect and esophageal spasm.    Discontinued diltiazem until repeat echo confirms EF is not reduced.  Added amlodipine 12m daily.  Addendum: BMET returned. Ruben [name] add on low dose ARB.  Tx of underlying GERD/esophageal stricture.   Adding  Imdur as above.    Continue Protonix. GI cocktail.   BP control recommended as below.  Aggressive risk factor modification, including smoking cessation.   Determine goals of care as below.   Respiratory distress with known COPD and lung mass --Likely contributing to the above CP and SOB. Continue treatment per primary team, pulmonology, and oncology.  Consider Xopenex over albuterol given the ADR profile associated with this medication and cardiac patients. Transitioned from lopressor to Coreg - continue as tolerated, given BB may exacerbate underlying lung dz.   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  Previous echo with EF 65 to 70%, G1 DD, NR WMA, mild RVE, mild concentric hypertrophy, and pericardial effusion. Today, appears euvolemic on exam.  Echo ordered, pending.    PTA medications include both diltiazem and lopressor.  Changed lopressor to Coreg for BP support.  Discontinued diltiazem  315m qd until EF known at this time.    Added amlodipine 113mdaily.  Added  Imdur 3035maily.  Unclear the reason for keeping the patient on digoxin at this time and Ruben [name] discuss with MD.  Continue diuresis with PTA lasix.   Titration of lasix pending repeat BMET. Further recommendations if needed pending echo. --ADDENDUM: BMET returned - Ruben [name] add on low dose ARB as well. Additional dose of IV lasix acceptable at this time.   Documented history of atrial fibrillation not on [place] [facility]  At that time, a short course of anticoagulation was recommended.  Not on [place] this time. PTA medications include digoxin, diltiazem, and lopressor.  No Afib by EKG or telemetry.   Defer anticoagulation pending clear evidence of Afib, as well as given lung CA with CBC pending.   Rate control  Changed lopressor to Coreg for BP support.  Discontinued diltiazem 300m3m for now until EF known at this time.    Unclear the reason for keeping the patient on digoxin at this time and Ruben [name] discuss with MD.  Hypertension -- BP suboptimal.  As above, would caution against diltiazem given unknown EF.  Continue BB as HR and lung dz allows and transitioned from lopressor to Coreg for BP support.  Ruben [name] add amlodipine for additional BP support and antianginal effect. Continue PRN hydralazine.  --ADDENDUM: BMET returned - Yoneko Talerico add on low dose ARB as well. Additional dose of IV lasix acceptable at this time.   HLD --Continue statin.  Anxiety, depression --Per IM.  Smoking  --Complete cessation recommended.  Continue nicotine patch.  GERD/Esophageal stricture -- Consider is contributing to his symptoms, given his drooling and burping.  Continue PPI, GI cocktail.  As previously noted, Ruben [name] add Imdur for further relief of chest pain and possible esophageal spasm.  Palliative care status --Recommend determine goals of care.        :2 For questions or updates, please contact [facility] consult [email or web address] for  contact info under    Signed, Ruben [name]  08/23/2020 8:47 AM    I have seen and examined this patient with JacqMarrianne Moodgree with above, note added to reflect my findings.  On exam, RRR, no murmurs.  Patient presented to hospital initially with shortness of breath.  This morning, he did develop chest pain.  All he tells me today is that his chest hurts.  It is only in his chest and does not radiate.  He is his ECG fortunately is unchanged without ischemic features.  His troponin is also negative x2.  At this point, due to the negative studies thus far, with think that this is noncardiac chest pain.  Would look for other sources of chest pain.  Ruben [name]. Joann Kulpa MD 08/23/2020 12:33 PM

## 2020-08-23 NOTE — Progress Notes (Signed)
Met with patient, sister's and medical staff. Patient now has goals of care in place, sister's are present for support. Please contact Chaplain if further support is needed.

## 2020-08-23 NOTE — TOC Progression Note (Signed)
Transition of Care [facility]) - Progression Note    Patient Details  Name: Ruben [name] MRN: [id number] Date of Birth: 12/24/1955  Transition of Care [facility]) CM/SW Contact  Shelbie Hutching, RN Phone Number: 08/23/2020, 12:31 PM  Clinical Narrative:    RNCM received a message from the bedside RN that patient is considering comfort care and residential hospice if he does not pass in the hospital.  MD is coming up to discuss comfort care with the patient.  Patient's sister is at the bedside and she is worried about cost of funeral and what happens if the family cannot pay for the funeral.  RNCM answerd what I could but once patient dies if in the hospital he would go to the morgue but unsure of process after that if family is unable to accept the body.   TOC will cont to follow.    Expected Discharge Plan: Hospice Medical Facility Barriers to Discharge: Other (comment) (family dynamics, patient undecided and scared)  Expected Discharge Plan and Services Expected Discharge Plan: [place]     Post Acute Care Choice: Hospice Living arrangements for the past 2 months: Single Family Home                 DME Arranged: 3-N-1,Oxygen,Nebulizer machine DME Agency: [employer] Date DME Agency Contacted: 08/21/20 Time DME Agency Contacted: [phone] Representative spoke with at DME Agency: [place]: PT,RN,OT           Social Determinants of Health (SDOH) Interventions    Readmission Risk Interventions No flowsheet data found.

## 2020-08-23 NOTE — TOC Progression Note (Signed)
Transition of Care [facility]) - Progression Note    Patient Details  Name: Ruben [name] MRN: [id number] Date of Birth: 09-30-1955  Transition of Care [facility]) CM/SW Contact  Shelbie Hutching, RN Phone Number: 08/23/2020, 4:04 PM  Clinical Narrative:    Patient has decided on Hospice care and is appropriate for hospice facility.  Patient and patient's sister [name] had initially wanted [facility] but patient does not have a home to go back to.  Dawn with [facility] has helped facilitate referral to [facility].  Referral faxed over to [phone].   Expected Discharge Plan: Hospice Medical Facility Barriers to Discharge: Other (comment) (family dynamics, patient undecided and scared)  Expected Discharge Plan and Services Expected Discharge Plan: [place]     Post Acute Care Choice: Hospice Living arrangements for the past 2 months: Single Family Home                 DME Arranged: 3-N-1,Oxygen,Nebulizer machine DME Agency: [employer] Date DME Agency Contacted: 08/21/20 Time DME Agency Contacted: [phone] Representative spoke with at DME Agency: [place]: PT,RN,OT           Social Determinants of Health (SDOH) Interventions    Readmission Risk Interventions No flowsheet data found.

## 2020-08-23 NOTE — Progress Notes (Signed)
Chaplain called to provide support to patient who has received a diagnosis he is unaccepting of. I sat with him for a period of time, I believe the most important thing he said was " I'm scared". I too have attempted to make contact with his family member's including ex wife, no response. I will continue to follow up with patient.

## 2020-08-23 NOTE — Progress Notes (Addendum)
Patient c/o chest pain, diaphoretic. EKG/ CXR completed. Dr. Manuella Ghazi notified and is at bedside including ICU nurse.   Attempted to contact family x3, unsuccessful.   Felicie Morn (Neighbor who called 911): [phone]

## 2020-08-23 NOTE — Progress Notes (Signed)
PT Cancellation Note  Patient Details Name: Ruben [name] MRN: [id number] DOB: 1955/06/01   Cancelled Treatment:    Reason Eval/Treat Not Completed: Other (comment) Per conversation with RN pt has transitioned to comfort care. PT to sign off at this time. Please reconsult with any change in status.  Herminio Commons, PT, DPT 1:46 PM,08/23/20

## 2020-08-23 NOTE — Progress Notes (Signed)
[place] at [place] NAME: Ruben [name]    MR#:  [id number]  DATE OF BIRTH:  [birth year]  SUBJECTIVE:  CHIEF COMPLAINT:   Chief Complaint  Patient presents with  . Shortness of Breath  Complains of chest pain earlier this morning and was diaphoretic.  He is tired and now agreeable to be at peace and wanting to be under hospice care.  Sister at bedside also in agreement to keep him comfortable REVIEW OF SYSTEMS:  Review of Systems  Constitutional: Positive for malaise/fatigue. Negative for diaphoresis, fever and weight loss.  HENT: Negative for ear discharge, ear pain, hearing loss, nosebleeds, sore throat and tinnitus.   Eyes: Negative for blurred vision and pain.  Respiratory: Positive for cough and shortness of breath. Negative for hemoptysis and wheezing.   Cardiovascular: Positive for chest pain. Negative for palpitations, orthopnea and leg swelling.  Gastrointestinal: Negative for abdominal pain, blood in stool, constipation, diarrhea, heartburn, nausea and vomiting.  Genitourinary: Negative for dysuria, frequency and urgency.  Musculoskeletal: Negative for back pain and myalgias.  Skin: Negative for itching and rash.  Neurological: Negative for dizziness, tingling, tremors, focal weakness, seizures, weakness and headaches.  Psychiatric/Behavioral: Positive for depression. The patient is nervous/anxious.    DRUG ALLERGIES:  No Known Allergies VITALS:  Blood pressure (!) 167/58, pulse (!) 59, temperature 97.7 F (36.5 C), temperature source Oral, resp. rate 19, height 6' (1.829 m), weight 64.2 kg, SpO2 100 %. PHYSICAL EXAMINATION:  Physical Exam  65 year old cachectic, disheveled looking male lying in the bed comfortably without any acute distress.  He looks older than his stated age. Lungs Wheezing at the bases.  No accessory muscles of respiration in use.  No rhonchi or rales Cardiovascular S1-S2 normal, no murmur rales or gallop Abdomen  soft, benign Extremities no pedal edema cyanosis or clubbing Neuro alert and oriented, nonfocal exam Skin no rash or lesion Psych: Normal mood and affect LABORATORY PANEL:  Male CBC Recent Labs  Lab 08/23/20 0906  WBC 13.9*  HGB 14.4  HCT 43.6  PLT 369   ------------------------------------------------------------------------------------------------------------------ Chemistries  Recent Labs  Lab 08/19/20 0745 08/20/20 0312 08/23/20 0906  NA 135   < > 135  K 3.6   < > 4.4  CL 97*   < > 87*  CO2 26   < > 40*  GLUCOSE 109*   < > 155*  BUN 10   < > 32*  CREATININE 0.65   < > 0.69  CALCIUM 9.3   < > 9.9  MG 1.9  --  2.0  AST 13*  --   --   ALT 6  --   --   ALKPHOS 69  --   --   BILITOT 0.8  --   --    < > = values in this interval not displayed.   RADIOLOGY:  DG Chest Port 1 View  Result Date: 08/23/2020 CLINICAL DATA:  Shortness of breath.  History of lung cancer EXAM: PORTABLE CHEST 1 VIEW COMPARISON:  Chest CT 08/19/2020 FINDINGS: Generalized interstitial coarsening which is chronic. Right upper lobe nodule which is known. There is no edema, consolidation, effusion, or pneumothorax. Normal heart size and mediastinal contours. IMPRESSION: 1. No acute finding when compared to prior. 2. COPD and right upper lobe nodule. Electronically Signed   By: Monte Fantasia M.D.   On: 08/23/2020 08:26   ASSESSMENT AND PLAN:  65 year old male with a known history  of esophageal stricture, COPD, stroke, GERD, depression with anxiety, CAD, lung cancer, tobacco abuse and possible atrial fibrillation is admitted for worsening shortness of breath.  Acute on chronic hypoxic respiratory failure due to COPD exacerbation with underlying lung mass Severe COPD/advanced emphysema Right upper lobe lung mass worrisome for cancer Depression and anxiety Protein-calorie malnutrition, severe: BMI 16.8 HTN (hypertension) HLD (hyperlipidemia) Stroke (HCC) GERD (gastroesophageal reflux  disease)/esophageal stricture Chronic diastolic CHF (congestive heart failure) ([place]): Tobacco abuse Goals of care I had a long discussion with patient and his sister who are all in agreement to keep him comfortable and pursue hospice services at home.  Considering sister who works for [employer] care she requested to take alternative hospice possibly [facility] to avoid conflict of interest.  I have reached out to hospice liaison who will see him today  Net IO Since Admission: -3,218 mL [08/23/20 1425]      Status is: Inpatient  Remains inpatient appropriate because:Ongoing diagnostic testing needed not appropriate for outpatient work up   Dispo: The patient is from: Home              Anticipated d/c is to: Home with hospice              Patient currently is not medically stable to d/c.      DVT prophylaxis:            Family Communication: "discussed with patient" and sisters at bedside on 5/14   All the records are reviewed and case discussed with Care Management/Social Worker. Management plans discussed with the patient, family, nursing and they are in agreement.  CODE STATUS: DNR Level of care: Progressive Cardiac  TOTAL TIME TAKING CARE OF THIS PATIENT: 35 minutes.   More than 50% of the time was spent in counseling/coordination of care: YES  POSSIBLE D/C IN 1 DAYS, DEPENDING ON CLINICAL CONDITION.   Max Sane M.D on 08/23/2020 at 2:25 PM  [facility]   CC: Primary care physician; Ranae Plumber, PA  Note: This dictation was prepared with Dragon dictation along with smaller phrase technology. Any transcriptional errors that result from this process are unintentional.

## 2020-08-24 LAB — CULTURE, BLOOD (ROUTINE X 2)
Culture: NO GROWTH
Culture: NO GROWTH
Special Requests: ADEQUATE
Special Requests: ADEQUATE

## 2020-08-24 NOTE — Progress Notes (Signed)
   Notified patient is comfort care. [facility] will sign off at this time.  Signed, Arvil Chaco, PA-C 08/24/2020, 11:25 AM

## 2020-08-24 NOTE — TOC Progression Note (Signed)
Transition of Care [facility]) - Progression Note    Patient Details  Name: KALLEN MCCRYSTAL MRN: [id number] Date of Birth: 06-Jan-1956  Transition of Care [facility]) CM/SW [place], LCSW Phone Number: 08/24/2020, 11:06 AM  Clinical Narrative:   Call from [place] with Hospice Home. Patient has been approved for hospice home and added to their waitlist. No bed at this time.    Expected Discharge Plan: Hospice Medical Facility Barriers to Discharge: Other (comment) (family dynamics, patient undecided and scared)  Expected Discharge Plan and Services Expected Discharge Plan: [place]     Post Acute Care Choice: Hospice Living arrangements for the past 2 months: Single Family Home                 DME Arranged: 3-N-1,Oxygen,Nebulizer machine DME Agency: [employer] Date DME Agency Contacted: 08/21/20 Time DME Agency Contacted: [phone] Representative spoke with at DME Agency: [place]: PT,RN,OT           Social Determinants of Health (SDOH) Interventions    Readmission Risk Interventions No flowsheet data found.

## 2020-08-24 NOTE — Progress Notes (Signed)
[place] at [place] NAME: Ruben [name]    MR#:  [id number]  DATE OF BIRTH:  Sep 10, 1955  SUBJECTIVE:  CHIEF COMPLAINT:   Chief Complaint  Patient presents with  . Shortness of Breath  Patient is confused.  Waiting for hospice home bed REVIEW OF SYSTEMS:  Unable to obtain due to his mental status DRUG ALLERGIES:  No Known Allergies VITALS:  Blood pressure 114/72, pulse 69, temperature 98.4 F (36.9 C), resp. rate 20, height 6' (1.829 m), weight 64.2 kg, SpO2 95 %. PHYSICAL EXAMINATION:  Physical Exam  65 year old cachectic, disheveled looking male lying in the bed comfortably without any acute distress.  He looks older than his stated age. Lungs Wheezing at the bases.  No accessory muscles of respiration in use.  No rhonchi or rales Cardiovascular S1-S2 normal, no murmur rales or gallop Abdomen soft, benign Extremities no pedal edema cyanosis or clubbing Neuro confused, nonfocal exam Skin no rash or lesion Psych: Normal mood and affect LABORATORY PANEL:  Male CBC Recent Labs  Lab 08/23/20 0906  WBC 13.9*  HGB 14.4  HCT 43.6  PLT 369   ------------------------------------------------------------------------------------------------------------------ Chemistries  Recent Labs  Lab 08/19/20 0745 08/20/20 0312 08/23/20 0906  NA 135   < > 135  K 3.6   < > 4.4  CL 97*   < > 87*  CO2 26   < > 40*  GLUCOSE 109*   < > 155*  BUN 10   < > 32*  CREATININE 0.65   < > 0.69  CALCIUM 9.3   < > 9.9  MG 1.9  --  2.0  AST 13*  --   --   ALT 6  --   --   ALKPHOS 69  --   --   BILITOT 0.8  --   --    < > = values in this interval not displayed.   RADIOLOGY:  No results found. ASSESSMENT AND PLAN:  65 year old male with a known history of esophageal stricture, COPD, stroke, GERD, depression with anxiety, CAD, lung cancer, tobacco abuse and possible atrial fibrillation is admitted for worsening shortness of breath.  Acute on chronic hypoxic  respiratory failure due to COPD exacerbation with underlying lung mass Severe COPD/advanced emphysema Right upper lobe lung mass worrisome for cancer Depression and anxiety Protein-calorie malnutrition, severe: BMI 16.8 HTN (hypertension) HLD (hyperlipidemia) Stroke (HCC) GERD (gastroesophageal reflux disease)/esophageal stricture Chronic diastolic CHF (congestive heart failure) ([place]): Tobacco abuse Goals of care Full comfort care.  Waiting for hospice home bed at this point  Net IO Since Admission: -4,430.34 mL [08/24/20 1337]      Status is: Inpatient  Remains inpatient appropriate because:Ongoing diagnostic testing needed not appropriate for outpatient work up   Dispo: The patient is from: Home              Anticipated d/c is to: Hospice home               Patient currently is not medically stable to d/c.      DVT prophylaxis:            Family Communication: "discussed with patient" and sisters at bedside on 5/14   All the records are reviewed and case discussed with Care Management/Social Worker. Management plans discussed with the patient, family, nursing and they are in agreement.  CODE STATUS: DNR Level of care: Progressive Cardiac  TOTAL TIME TAKING CARE OF THIS  PATIENT: 35 minutes.   More than 50% of the time was spent in counseling/coordination of care: YES  POSSIBLE D/C IN 1 DAYS, DEPENDING ON CLINICAL CONDITION.  Whenever hospice home bed is available   Max Sane M.D on 08/24/2020 at 1:37 PM  [facility]   CC: Primary care physician; Ranae Plumber, PA  Note: This dictation was prepared with Dragon dictation along with smaller phrase technology. Any transcriptional errors that result from this process are unintentional.

## 2020-08-25 ENCOUNTER — Inpatient Hospital Stay (HOSPITAL_COMMUNITY)
Admit: 2020-08-25 | Discharge: 2020-08-25 | Disposition: A | Discharge status: Home / Self Care | Payer: Medicaid Other | Attending: Physician Assistant | Admitting: Physician Assistant

## 2020-08-25 DIAGNOSIS — R079 Chest pain, unspecified: Secondary | ICD-10-CM | POA: Diagnosis not present

## 2020-08-25 DIAGNOSIS — Z515 Encounter for palliative care: Secondary | ICD-10-CM

## 2020-08-25 LAB — ECHOCARDIOGRAM COMPLETE
Height: 72 in
S' Lateral: 2.18 cm
Weight: 2265.6 oz

## 2020-08-25 MED ORDER — LORAZEPAM 0.5 MG PO TABS: 0.5000 mg | ORAL_TABLET | Freq: Three times a day (TID) | ORAL | 0 refills | Status: AC | PRN

## 2020-08-25 MED ORDER — MORPHINE SULFATE (CONCENTRATE) 10 MG /0.5 ML PO SOLN: 5.0000 mg | ORAL | 0 refills | Status: AC | PRN

## 2020-08-25 NOTE — Discharge Instructions (Signed)
Hospice Hospice is a service that provides people who are terminally ill and their families with medical, spiritual, and psychological support. It aims to improve a person's quality of life by keeping the person as comfortable as possible in the final stages of life. Who makes up the hospice care team? The following people make up a hospice care team:  The person receiving care and his or her family.  A nurse and a hospice doctor. Your primary health care provider can also be included.  A Education officer, museum or Tourist information centre manager.  A religious or spiritual leader.  Specialists such as: ? A dietitian. ? A mental health counselor. ? Physical and occupational therapists. ? Bereavement coordinator.  Trained volunteers who can help with care.   What services are included in hospice care? Hospice services can vary, depending on the organization. Generally, they include:  Ways to keep you comfortable, such as: ? Providing care in your home or in a home-like setting. ? Providing relief from pain and symptoms. The staff will supply all medicines and equipment to keep you comfortable and alert enough to enjoy the company of friends and family. ? Working with your loved ones to help them meet your needs and provide much of your basic care. This helps you to enjoy their support.  Visits or care from a nurse and doctor. This may include 24-hour on-call services.  Visits from other specialists who offer services, such as: ? Counseling to meet your emotional, spiritual, and social needs as well as those needs of your family. ? Massage therapy. ? Nutrition therapy. ? Physical and occupational therapy. ? Art or music therapy. ? Spiritual care to meet your needs and your family's needs. It may involve:  Helping you and your family understand the dying process.  Helping you say goodbye to your family and friends.  Performing a specific religious ceremony or ritual.  Companionship when you are  alone.  Allowing you and your family to rest. Hospice staff may do light housekeeping, prepare meals, and run errands.  Short-term inpatient care, if something cannot be managed in the home.  Bereavement support for grieving family members. When should hospice care begin? Most people who use hospice are believed to have 6 months or less to live.  Your family and health care providers can help you decide when hospice services should begin.  If you live longer than 6 months but your condition does not improve, your doctor may be able to approve you for continued hospice care.  If your condition improves, you may discontinue the program. What should I consider before selecting a program? Most hospice programs are run by nonprofit, independent organizations. Some are affiliated with hospitals, nursing homes, or home health care agencies. Hospice programs can take place in your home or at a hospice center, hospital, or skilled nursing facility. When choosing a hospice program, ask about the following:  What services are available to me and my loved ones? ? What does it cost? Is it covered by insurance? ? Is the program reviewed and licensed by the state or certified in some other way? ? How will my pain and symptoms be managed? ? Will you provide emotional and spiritual support?  If I choose a hospice center or nursing home, where is the hospice center located? Is it convenient for family and friends? ? If I choose a hospice center or nursing home, will my family and friends be able to visit any time? ? If my circumstances change, can  the services be provided in a different setting, such as in my home or in the hospital?  Who makes up the hospice care team? How are they trained or screened? ? How involved can my loved ones be? ? Whom can my family call with questions? ? How is my health care provider involved? Where can I learn more about hospice? You can learn about existing hospice  programs in your area from your health care providers. The websites of the following organizations have helpful information:  [facility] and Palliative Care Organization: http://www.brown-buchanan.com/  National Association for [place]: http://massey-hart.com/  Hospice Foundation of America: www.hospicefoundation.org  American Cancer Society: www.cancer.org  eHospice: ehospice.com These agencies also can provide information:  A local agency on aging.  Your local [employer] chapter.  Your state's department of health or social services. Summary  Hospice is a service that provides people who are terminally ill and their families with medical, spiritual, and psychological support.  Hospice aims to improve your quality of life by keeping you as comfortable as possible in the final stages of life.  Hospice teams often include a doctor, nurse, social worker, counselor, religious or spiritual leader, dietitian, therapists, and volunteers.  Hospice care generally includes pain management, visits from doctors and nurses, physical and occupational therapy, nutrition therapy, spiritual and emotional counseling, caregiver support, and bereavement support for grieving family members.  Hospice programs can take place in your home or at a hospice center, hospital, or skilled nursing facility. This information is not intended to replace advice given to you by your health care provider. Make sure you discuss any questions you have with your health care provider. Document Revised: 01/01/2020 Document Reviewed: 01/01/2020 Elsevier Patient Education  2021 [employer].

## 2020-08-25 NOTE — Progress Notes (Signed)
OT Cancellation Note  Patient Details Name: Ruben [name] MRN: [id number] DOB: [birth year]   Cancelled Treatment:    Reason Eval/Treat Not Completed: Other (comment) . Pt now placed on comfort care only. OT to complete orders.   Darleen Crocker, [place], OTR/L , CBIS ascom [phone]  08/25/20, 8:36 AM  08/25/2020, 8:36 AM

## 2020-08-25 NOTE — Progress Notes (Signed)
[place] [facility]) Hospital Liaison RN note:  Received referral 05.14 for request for Hospice Home from [place], [place]. Patient was a current new referral for palliative out patient. Chart was reviewed and eligibility for Hospice Home was approved. Per epic chat with Dr. Manuella Ghazi this morning, patient has declined Hospice Home and has decided to go home with [facility]. Referral Intake has been notified and this referral will be closed.  Thank you.  Zandra Abts, RN [facility] Liaison [phone]

## 2020-08-25 NOTE — Progress Notes (Signed)
*  PRELIMINARY RESULTS* Echocardiogram 2D Echocardiogram has been performed.  Sherrie Sport 08/25/2020, 8:00 AM

## 2020-08-26 ENCOUNTER — Encounter: Payer: Self-pay | Admitting: *Deleted

## 2020-08-27 NOTE — Discharge Summary (Signed)
[facility] NAME: Ruben [name]    MR#:  [id number]  DATE OF BIRTH:  1955-08-27  DATE OF ADMISSION:  08/19/2020   ADMITTING PHYSICIAN: Ivor Costa, MD  DATE OF DISCHARGE: 08/25/2020 11:27 AM  PRIMARY CARE PHYSICIAN: Ranae Plumber, PA   ADMISSION DIAGNOSIS:  Aortic atherosclerosis ([place]) [I70.0] Positive D dimer [R79.89] COPD exacerbation (HCC) [J44.1] Acute respiratory failure with hypoxia ([place]) [J96.01] Malignant neoplasm of upper lobe of right lung (HCC) [C34.11] Acute on chronic respiratory failure with hypoxia (HCC) [J96.21] DISCHARGE DIAGNOSIS:  Principal Problem:   COPD exacerbation (HCC) Active Problems:   Protein-calorie malnutrition, severe   HTN (hypertension)   HLD (hyperlipidemia)   Stroke (HCC)   GERD (gastroesophageal reflux disease)   Chronic diastolic CHF (congestive heart failure) (HCC)   Tobacco abuse   Acute respiratory failure with hypoxia (HCC)   Depression with anxiety   Lung cancer (HCC)   Sepsis ([place])   Aortic atherosclerosis ([place])   Hospice care  SECONDARY DIAGNOSIS:   Past Medical History:  Diagnosis Date  . Cancer ([place])    lung cancer  . CHF (congestive heart failure) ([place])   . COPD (chronic obstructive pulmonary disease) ([place])   . Coronary artery disease   . Depression   . Food impaction of esophagus   . Food impaction of esophagus   . Food impaction of esophagus   . Hypertension   . Myocardial infarction ([place])   . Stroke [facility])    HOSPITAL COURSE:  65 year old male with a known history of esophageal stricture, COPD, stroke, GERD, depression with anxiety, CAD, lung cancer, tobacco abuse and possible atrial fibrillation is admitted for worsening shortness of breath.  Acute on chronic hypoxic respiratory failure due to COPD exacerbation with underlying lung mass Severe COPD/advanced emphysema Right upper lobe lung mass worrisome for cancer Depression and anxiety Protein-calorie malnutrition,  severe:BMI 16.8 HTN (hypertension) HLD (hyperlipidemia) Stroke (HCC) GERD (gastroesophageal reflux disease)/esophageal stricture Chronic diastolic CHF (congestive heart failure) ([place]): Tobacco abuse Severe protein-calorie malnutrition  After d/w patient and family. He decided for full comfort care and going home with [facility] hospice.  DISCHARGE CONDITIONS:  fair CONSULTS OBTAINED:  Treatment Team:  Lloyd Huger, MD DRUG ALLERGIES:  No Known Allergies DISCHARGE MEDICATIONS:   Allergies as of 08/25/2020   No Known Allergies     Medication List    STOP taking these medications   albuterol 108 (90 Base) MCG/ACT inhaler Commonly known as: VENTOLIN HFA   atorvastatin 10 MG tablet Commonly known as: LIPITOR   atorvastatin 20 MG tablet Commonly known as: LIPITOR   diazepam 10 MG tablet Commonly known as: VALIUM   diazepam 5 MG tablet Commonly known as: VALIUM   digoxin 0.25 MG tablet Commonly known as: LANOXIN   diltiazem 300 MG 24 hr capsule Commonly known as: CARDIZEM CD   furosemide 20 MG tablet Commonly known as: Lasix   ipratropium-albuterol 0.5-2.5 (3) MG/3ML Soln Commonly known as: DUONEB   metoprolol tartrate 25 MG tablet Commonly known as: LOPRESSOR   mometasone-formoterol 200-5 MCG/ACT Aero Commonly known as: DULERA   multivitamin with minerals Tabs tablet   nicotine 21 mg/24hr patch Commonly known as: NICODERM CQ - dosed in mg/24 hours   omeprazole 40 MG capsule Commonly known as: PriLOSEC   pantoprazole 40 MG tablet Commonly known as: PROTONIX   senna-docusate 8.6-50 MG tablet Commonly known as: Senokot-S   sertraline 100 MG tablet Commonly known as: ZOLOFT     TAKE  these medications   LORazepam 0.5 MG tablet Commonly known as: Ativan Take 1 tablet (0.5 mg total) by mouth every 8 (eight) hours as needed for anxiety.   morphine CONCENTRATE 10 mg / 0.5 ml concentrated solution Take 0.25 mLs (5 mg total) by mouth every 4  (four) hours as needed for up to 3 days for severe pain.      DISCHARGE INSTRUCTIONS:   DIET:  Regular diet DISCHARGE CONDITION:  Fair ACTIVITY:  Activity as tolerated OXYGEN:  Home Oxygen: No.  Oxygen Delivery: room air DISCHARGE LOCATION:  Home with Hospice   If you experience worsening of your admission symptoms, develop shortness of breath, life threatening emergency, suicidal or homicidal thoughts you must seek medical attention immediately by calling 911 or calling your MD immediately  if symptoms less severe.  You Must read complete instructions/literature along with all the possible adverse reactions/side effects for all the Medicines you take and that have been prescribed to you. Take any new Medicines after you have completely understood and accpet all the possible adverse reactions/side effects.   Please note  You were cared for by a hospitalist during your hospital stay. If you have any questions about your discharge medications or the care you received while you were in the hospital after you are discharged, you can call the unit and asked to speak with the hospitalist on call if the hospitalist that took care of you is not available. Once you are discharged, your primary care physician will handle any further medical issues. Please note that NO REFILLS for any discharge medications will be authorized once you are discharged, as it is imperative that you return to your primary care physician (or establish a relationship with a primary care physician if you do not have one) for your aftercare needs so that they can reassess your need for medications and monitor your lab values.    On the day of Discharge:  VITAL SIGNS:  Blood pressure 131/76, pulse 96, temperature 97.8 F (36.6 C), temperature source Oral, resp. rate 16, height 6' (1.829 m), weight 64.2 kg, SpO2 100 %. PHYSICAL EXAMINATION:  GENERAL:  65 y.o.-year-old patient lying in the bed with no acute distress.   EYES: Pupils equal, round, reactive to light and accommodation. No scleral icterus. Extraocular muscles intact.  HEENT: Head atraumatic, normocephalic. Oropharynx and nasopharynx clear.  NECK:  Supple, no jugular venous distention. No thyroid enlargement, no tenderness.  LUNGS: Normal breath sounds bilaterally, no wheezing, rales,rhonchi or crepitation. No use of accessory muscles of respiration.  CARDIOVASCULAR: S1, S2 normal. No murmurs, rubs, or gallops.  ABDOMEN: Soft, non-tender, non-distended. Bowel sounds present. No organomegaly or mass.  EXTREMITIES: No pedal edema, cyanosis, or clubbing.  NEUROLOGIC: Cranial nerves II through XII are intact. Muscle strength 5/5 in all extremities. Sensation intact. Gait not checked.  PSYCHIATRIC: The patient is alert and oriented x 3.  SKIN: No obvious rash, lesion, or ulcer.  DATA REVIEW:   CBC Recent Labs  Lab 08/23/20 0906  WBC 13.9*  HGB 14.4  HCT 43.6  PLT 369    Chemistries  Recent Labs  Lab 08/23/20 0906  NA 135  K 4.4  CL 87*  CO2 40*  GLUCOSE 155*  BUN 32*  CREATININE 0.69  CALCIUM 9.9  MG 2.0     Outpatient follow-up  Follow-up Information    [facility], [place], [place]. Go on 08/26/2020.   Why: Appointment is at 11:15 on 08/26/20 Contact information: [address], [place], [place] [address] [place] [address] [phone]        Ranae Plumber, [place].   Specialty: Family Medicine Why: Patient will need to make a follow up appointment. Contact information: [place] [address] [phone]               30 Day Unplanned Readmission Risk Score   Flowsheet Row ED to Hosp-Admission (Discharged) from 08/19/2020 in [facility]  30 Day Unplanned Readmission Risk Score (%) 18.28 Filed at 08/25/2020 0801     This score is the patient's risk of an unplanned readmission within 30 days of being discharged (0 -100%). The score is based on dignosis, age, lab  data, medications, orders, and past utilization.   Low:  0-14.9   Medium: 15-21.9   High: 22-29.9   Extreme: 30 and above         Management plans discussed with the patient, family and they are in agreement.  CODE STATUS: Prior   TOTAL TIME TAKING CARE OF THIS PATIENT: 45 minutes.    Max Sane M.D on 08/27/2020 at 9:19 AM  [facility]   CC: Primary care physician; Ranae Plumber, PA   Note: This dictation was prepared with Dragon dictation along with smaller phrase technology. Any transcriptional errors that result from this process are unintentional.

## 2020-08-29 ENCOUNTER — Other Ambulatory Visit: Payer: Self-pay

## 2020-08-29 ENCOUNTER — Ambulatory Visit
Admission: EM | Admit: 2020-08-29 | Discharge: 2020-08-30 | Disposition: A | Discharge status: Home / Self Care | Payer: Medicaid Other | Attending: Emergency Medicine | Admitting: Emergency Medicine

## 2020-08-29 DIAGNOSIS — Z20822 Contact with and (suspected) exposure to covid-19: Secondary | ICD-10-CM | POA: Insufficient documentation

## 2020-08-29 DIAGNOSIS — K2100 Gastro-esophageal reflux disease with esophagitis, without bleeding: Secondary | ICD-10-CM | POA: Insufficient documentation

## 2020-08-29 DIAGNOSIS — I509 Heart failure, unspecified: Secondary | ICD-10-CM | POA: Diagnosis not present

## 2020-08-29 DIAGNOSIS — K449 Diaphragmatic hernia without obstruction or gangrene: Secondary | ICD-10-CM | POA: Insufficient documentation

## 2020-08-29 DIAGNOSIS — J440 Chronic obstructive pulmonary disease with acute lower respiratory infection: Secondary | ICD-10-CM | POA: Diagnosis not present

## 2020-08-29 DIAGNOSIS — T18128A Food in esophagus causing other injury, initial encounter: Secondary | ICD-10-CM | POA: Diagnosis not present

## 2020-08-29 DIAGNOSIS — Z833 Family history of diabetes mellitus: Secondary | ICD-10-CM | POA: Insufficient documentation

## 2020-08-29 DIAGNOSIS — I110 Hypertensive heart disease with heart failure: Secondary | ICD-10-CM | POA: Insufficient documentation

## 2020-08-29 DIAGNOSIS — F17210 Nicotine dependence, cigarettes, uncomplicated: Secondary | ICD-10-CM | POA: Insufficient documentation

## 2020-08-29 DIAGNOSIS — Z8249 Family history of ischemic heart disease and other diseases of the circulatory system: Secondary | ICD-10-CM | POA: Diagnosis not present

## 2020-08-29 DIAGNOSIS — R1310 Dysphagia, unspecified: Secondary | ICD-10-CM | POA: Diagnosis not present

## 2020-08-29 DIAGNOSIS — C3411 Malignant neoplasm of upper lobe, right bronchus or lung: Secondary | ICD-10-CM | POA: Insufficient documentation

## 2020-08-29 DIAGNOSIS — X58XXXA Exposure to other specified factors, initial encounter: Secondary | ICD-10-CM | POA: Diagnosis not present

## 2020-08-29 DIAGNOSIS — T18108A Unspecified foreign body in esophagus causing other injury, initial encounter: Secondary | ICD-10-CM

## 2020-08-29 DIAGNOSIS — E46 Unspecified protein-calorie malnutrition: Secondary | ICD-10-CM | POA: Insufficient documentation

## 2020-08-29 DIAGNOSIS — Z79899 Other long term (current) drug therapy: Secondary | ICD-10-CM | POA: Diagnosis not present

## 2020-08-29 DIAGNOSIS — Z85118 Personal history of other malignant neoplasm of bronchus and lung: Secondary | ICD-10-CM | POA: Insufficient documentation

## 2020-08-29 DIAGNOSIS — K222 Esophageal obstruction: Secondary | ICD-10-CM | POA: Insufficient documentation

## 2020-08-29 MED ORDER — GLUCAGON HCL RDNA (DIAGNOSTIC) 1 MG IJ SOLR
1.0000 mg | Freq: Once | INTRAMUSCULAR | Status: AC
  Administered 2020-08-30: 1 mg via INTRAVENOUS
  Filled 2020-08-29: qty 1

## 2020-08-29 MED ORDER — ONDANSETRON HCL 4 MG/2ML IJ SOLN
4.0000 mg | Freq: Once | INTRAMUSCULAR | Status: AC
  Administered 2020-08-30: 4 mg via INTRAVENOUS
  Filled 2020-08-29: qty 2

## 2020-08-29 MED ORDER — NITROGLYCERIN 0.4 MG SL SUBL
0.4000 mg | SUBLINGUAL_TABLET | Freq: Once | SUBLINGUAL | Status: AC
  Administered 2020-08-30: 0.4 mg via SUBLINGUAL
  Filled 2020-08-29: qty 1

## 2020-08-29 MED ORDER — GLUCAGON HCL (RDNA) 1 MG IJ SOLR
1.0000 mg | Freq: Once | INTRAMUSCULAR | Status: DC
  Filled 2020-08-29: qty 1

## 2020-08-29 MED ORDER — PANTOPRAZOLE SODIUM 40 MG IV SOLR
40.0000 mg | Freq: Once | INTRAVENOUS | Status: AC
  Administered 2020-08-30: 40 mg via INTRAVENOUS
  Filled 2020-08-29: qty 40

## 2020-08-29 MED ORDER — SODIUM CHLORIDE 0.9 % IV SOLN: INTRAVENOUS | Status: AC

## 2020-08-29 NOTE — ED Triage Notes (Signed)
Pt presents with a cc of "roast beef" stuck in his throat for 1-2 hours. Patient spitting frequently in triage. Airway patent. Hx of same in the past which required scope to remove. PMH includes lung CA and COPD. Patient states he is supposed to be wearing 3L O2 at baseline.

## 2020-08-29 NOTE — ED Provider Notes (Signed)
[facility] Emergency [name] Provider Note  ____________________________________________   Event Date/Time   First MD Initiated Contact with Patient 08/29/20 2331     (approximate)  I have reviewed the triage vital signs and the nursing notes.   HISTORY  Chief Complaint Foreign Body    HPI Ruben [name] is a 65 y.o. male with history of lung [name], Ruben [name], Ruben [name] with esophageal foreign body.  States he ate roast beef at 4:30 PM and states he feels like it is stuck in his throat.  He complains of chest pain that he describes as "feeling like something is stuck".  No worsening shortness of breath.  Unable to swallow his own secretions.  Has had esophageal foreign bodies twice in the past.  Had an endoscopy in December 2020 and November 2021 for the same.  Last endoscopy showed severe esophagitis and a nonbleeding esophageal ulcer.      Past Medical History:  Diagnosis Date  . [name] ([place])    lung [name]  . Ruben [name] (congestive heart failure) ([place])   . Ruben (chronic obstructive pulmonary disease) ([place])   . Coronary artery disease   . Depression   . Food impaction of esophagus   . Food impaction of esophagus   . Food impaction of esophagus   . Hypertension   . Myocardial infarction ([place])   . Stroke [facility])     Patient Active Problem List   Diagnosis Date Noted  . Hospice care 08/25/2020  . Aortic atherosclerosis ([place])   . Acute respiratory failure with hypoxia ([place]) 08/19/2020  . Depression with anxiety 08/19/2020  . Lung [name] ([place]) 08/19/2020  . Sepsis ([place]) 08/19/2020  . Malignant neoplasm of upper lobe of right lung ([place])   . Positive D dimer   . HTN (hypertension) 03/30/2019  . HLD (hyperlipidemia) 03/30/2019  . Stroke ([place]) 03/30/2019  . GERD (gastroesophageal reflux disease) 03/30/2019  . CAD (coronary artery disease) 03/30/2019  . Chronic diastolic Ruben [name] (congestive heart  failure) ([place]) 03/30/2019  . Tobacco abuse 03/30/2019  . Foreign body in esophagus   . Protein-calorie malnutrition, severe 02/02/2019  . Major depressive disorder, recurrent episode, moderate ([place])   . Ruben (chronic obstructive pulmonary disease) ([place]) 05/29/2017  . Pneumonia 06/28/2015  . Ruben with exacerbation ([place]) 09/25/2014  . Depression 09/25/2014  . Ruben exacerbation ([place]) 09/25/2014  . Severe recurrent major depression without psychotic features ([place]) 09/25/2014    Past Surgical History:  Procedure Laterality Date  . CARDIAC CATHETERIZATION    . COLONOSCOPY    . ELBOW SURGERY Left   . ESOPHAGOGASTRODUODENOSCOPY N/A 01/31/2019   Procedure: ESOPHAGOGASTRODUODENOSCOPY (EGD);  Surgeon: Toledo, Benay Pike, MD;  Location: [facility] ENDOSCOPY;  Service: Gastroenterology;  Laterality: N/A;  . ESOPHAGOGASTRODUODENOSCOPY (EGD) WITH PROPOFOL N/A 03/30/2019   Procedure: ESOPHAGOGASTRODUODENOSCOPY (EGD) WITH PROPOFOL;  Surgeon: Lin Landsman, MD;  Location: [facility] ENDOSCOPY;  Service: Gastroenterology;  Laterality: N/A;  . ESOPHAGOGASTRODUODENOSCOPY (EGD) WITH PROPOFOL N/A 02/22/2020   Procedure: ESOPHAGOGASTRODUODENOSCOPY (EGD) WITH PROPOFOL;  Surgeon: Jonathon Bellows, MD;  Location: [facility] ENDOSCOPY;  Service: Gastroenterology;  Laterality: N/A;  . FLEXIBLE BRONCHOSCOPY Bilateral 08/27/2016   Procedure: FLEXIBLE BRONCHOSCOPY;  Surgeon: Allyne Gee, MD;  Location: [facility] ORS;  Service: Pulmonary;  Laterality: Bilateral;  . WRIST SURGERY Left     Prior to Admission medications   Medication Sig Start Date End Date Taking? Authorizing Provider  LORazepam (ATIVAN) 0.5 MG tablet Take 1  tablet (0.5 mg total) by mouth every 8 (eight) hours as needed for anxiety. 08/25/20 09/24/20  Max Sane, MD    Allergies Patient has no known allergies.  Family History  Problem Relation Age of Onset  . Heart disease Other   . Hypertension Other   . Diabetes Other   . CAD Mother     Social History Social  History   Tobacco Use  . Smoking status: Current Every Day Smoker    Packs/day: 1.00    Years: 43.00    Pack years: 43.00    Types: Cigarettes  . Smokeless tobacco: Never Used  Vaping Use  . Vaping Use: Never used  Substance Use Topics  . Alcohol use: No  . Drug use: Yes    Frequency: 4.0 times per week    Types: Marijuana    Review of Systems Constitutional: No fever. Eyes: No visual changes. ENT: No sore throat. Cardiovascular: + chest pain. Respiratory: Denies shortness of breath. Gastrointestinal: No nausea, vomiting, diarrhea. Genitourinary: Negative for dysuria. Musculoskeletal: Negative for back pain. Skin: Negative for rash. Neurological: Negative for focal weakness or numbness.  ____________________________________________   PHYSICAL EXAM:  VITAL SIGNS: ED Triage Vitals  Enc Vitals Group     BP 08/29/20 2047 131/64     Pulse Rate 08/29/20 2047 93     Resp 08/29/20 2047 (!) 24     Temp 08/29/20 2047 98.6 F (37 C)     Temp Source 08/29/20 2047 Oral     SpO2 08/29/20 2047 93 %     Weight 08/29/20 2047 135 lb (61.2 kg)     Height 08/29/20 2047 6' (1.829 m)     Head Circumference --      Peak Flow --      Pain Score 08/29/20 2102 10     Pain Loc --      Pain Edu? --      Excl. in [place]? --    CONSTITUTIONAL: Alert and oriented and responds appropriately to questions.  Chronically ill-appearing, actively spitting his saliva HEAD: Normocephalic EYES: Conjunctivae clear, pupils appear equal, EOM appear intact ENT: normal nose; moist mucous membranes, patent posterior oropharynx, normal phonation, no stridor, no trismus or drooling NECK: Supple, normal ROM CARD: RRR; S1 and S2 appreciated; no murmurs, no clicks, no rubs, no gallops RESP: Normal chest excursion without splinting or tachypnea; breath sounds clear and equal bilaterally; no wheezes, no rhonchi, no rales, no hypoxia or respiratory distress, speaking full sentences ABD/GI: Normal bowel sounds;  non-distended; soft, non-tender, no rebound, no guarding, no peritoneal signs, no hepatosplenomegaly BACK: The back appears normal EXT: Normal ROM in all joints; no deformity noted, no edema; no cyanosis SKIN: Normal color for age and race; warm; no rash on exposed skin NEURO: Moves all extremities equally PSYCH: The patient's mood and manner are appropriate.  ____________________________________________   LABS (all labs ordered are listed, but only abnormal results are displayed)  Labs Reviewed  CBC WITH DIFFERENTIAL/PLATELET - Abnormal; Notable for the following components:      Result Value   WBC 10.9 (*)    Hemoglobin 12.8 (*)    Neutro Abs 8.4 (*)    All other components within normal limits  RESP PANEL BY RT-PCR (FLU A&B, COVID) ARPGX2  BASIC METABOLIC PANEL  TROPONIN I (HIGH SENSITIVITY)   ____________________________________________  EKG   EKG Interpretation  Date/Time:  Saturday Aug 30 2020 01:05:28 EDT Ventricular Rate:  74 PR Interval:  149 QRS Duration: 79 QT  Interval:  365 QTC Calculation: 405 R Axis:   23 Text Interpretation: Sinus rhythm Probable left atrial enlargement Low voltage, precordial leads Consider anterior infarct Nonspecific T abnormalities, lateral leads Baseline wander in lead(s) V4 Confirmed by Pryor Curia [phone]) on 08/30/2020 1:12:39 AM       ____________________________________________  RADIOLOGY Jessie Foot Madalaine Portier, personally viewed and evaluated these images (plain radiographs) as part of my medical decision making, as well as reviewing the written report by the radiologist.  ED MD interpretation:    Official radiology report(s): No results found.  ____________________________________________   PROCEDURES  Procedure(s) performed (including Critical Care):  Procedures  ____________________________________________   INITIAL IMPRESSION / ASSESSMENT AND PLAN / ED COURSE  As part of my medical decision making, I reviewed the  following data within the [facility] notes reviewed and incorporated, Labs reviewed , EKG interpreted , Old EKG reviewed, A consult was requested and obtained from this/these consultant(s) GI and Notes from prior ED visits         Patient here with esophageal foreign body.  We will try glucagon, nitroglycerin, Protonix, Zofran.  Suspect that he will likely need an EGD as he states medications have failed in the past.  Given he is also complaining of chest discomfort which I suspect is from the esophageal foreign body, will obtain EKG and cardiac labs.  He is unable to swallow his secretions at this time but is not in distress.  ED PROGRESS  1:50 AM  Pt still unable to swallow secretions despite medications.  Cardiac work-up reassuring.  Will discuss with GI on-call.  COVID and flu negative.  1:57 AM  Spoke with Dr. Vicente Males with GI.  He will take patient to endoscopy in the morning.  Patient updated with plan.  I reviewed all nursing notes and pertinent previous records as available.  I have reviewed and interpreted any EKGs, lab and urine results, imaging (as available).  7:00 AM  Pt continues to feel poorly.  Patient to go to endoscopy this morning for his esophageal food bolus.   ____________________________________________   FINAL CLINICAL IMPRESSION(S) / ED DIAGNOSES  Final diagnoses:  Esophageal foreign body, initial encounter     ED Discharge Orders    None      *Please note:  PRESTON GARABEDIAN was evaluated in Emergency [name] on 08/30/2020 for the symptoms described in the history of present illness. He was evaluated in the context of the global COVID-19 pandemic, which necessitated consideration that the patient might be at risk for infection with the SARS-CoV-2 virus that causes COVID-19. Institutional protocols and algorithms that pertain to the evaluation of patients at risk for COVID-19 are in a state of rapid change based on information released by  regulatory bodies including the CDC and federal and state organizations. These policies and algorithms were followed during the patient's care in the ED.  Some ED evaluations and interventions may be delayed as a result of limited staffing during and the pandemic.*   Note:  This document was prepared using Dragon voice recognition software and may include unintentional dictation errors.   Lott Seelbach, Delice Bison, DO 08/30/20 [phone]

## 2020-08-30 ENCOUNTER — Encounter: Payer: Self-pay | Admitting: Anesthesiology

## 2020-08-30 ENCOUNTER — Emergency Department: Payer: Medicaid Other | Admitting: Anesthesiology

## 2020-08-30 ENCOUNTER — Encounter
Admission: EM | Disposition: A | Discharge status: Home / Self Care | Payer: Self-pay | Source: Home / Self Care | Attending: Emergency Medicine

## 2020-08-30 DIAGNOSIS — T18108A Unspecified foreign body in esophagus causing other injury, initial encounter: Secondary | ICD-10-CM | POA: Diagnosis not present

## 2020-08-30 HISTORY — PX: ESOPHAGOGASTRODUODENOSCOPY: SHX5428

## 2020-08-30 LAB — BASIC METABOLIC PANEL
Anion gap: 9 (ref 5–15)
BUN: 11 mg/dL (ref 8–23)
CO2: 29 mmol/L (ref 22–32)
Calcium: 9.1 mg/dL (ref 8.9–10.3)
Chloride: 103 mmol/L (ref 98–111)
Creatinine, Ser: 0.75 mg/dL (ref 0.61–1.24)
GFR, Estimated: >60 mL/min (ref >60)
Glucose, Bld: 87 mg/dL (ref 70–99)
Potassium: 4.3 mmol/L (ref 3.5–5.1)
Sodium: 141 mmol/L (ref 135–145)

## 2020-08-30 LAB — CBC WITH DIFFERENTIAL/PLATELET
Abs Immature Granulocytes: 0.06 10*3/uL (ref 0.00–0.07)
Basophils Absolute: 0 10*3/uL (ref 0.0–0.1)
Basophils Relative: 0 %
Eosinophils Absolute: 0.2 10*3/uL (ref 0.0–0.5)
Eosinophils Relative: 2 %
HCT: 40.1 % (ref 39.0–52.0)
Hemoglobin: 12.8 g/dL — ABNORMAL LOW (ref 13.0–17.0)
Immature Granulocytes: 1 %
Lymphocytes Relative: 11 %
Lymphs Abs: 1.2 10*3/uL (ref 0.7–4.0)
MCH: 27.1 pg (ref 26.0–34.0)
MCHC: 31.9 g/dL (ref 30.0–36.0)
MCV: 84.8 fL (ref 80.0–100.0)
Monocytes Absolute: 1 10*3/uL (ref 0.1–1.0)
Monocytes Relative: 9 %
Neutro Abs: 8.4 10*3/uL — ABNORMAL HIGH (ref 1.7–7.7)
Neutrophils Relative %: 77 %
Platelets: 286 10*3/uL (ref 150–400)
RBC: 4.73 MIL/uL (ref 4.22–5.81)
RDW: 13.9 % (ref 11.5–15.5)
WBC: 10.9 10*3/uL — ABNORMAL HIGH (ref 4.0–10.5)
nRBC: 0 % (ref 0.0–0.2)

## 2020-08-30 LAB — RESP PANEL BY RT-PCR (FLU A&B, COVID) ARPGX2
Influenza A by PCR: NEGATIVE
Influenza B by PCR: NEGATIVE
SARS Coronavirus 2 by RT PCR: NEGATIVE

## 2020-08-30 LAB — TROPONIN I (HIGH SENSITIVITY): Troponin I (High Sensitivity): 7 ng/L (ref <18)

## 2020-08-30 SURGERY — EGD (ESOPHAGOGASTRODUODENOSCOPY)
Anesthesia: General

## 2020-08-30 MED ORDER — DEXAMETHASONE SODIUM PHOSPHATE 10 MG/ML IJ SOLN
INTRAMUSCULAR | Status: DC | PRN
  Administered 2020-08-30: 10 mg via INTRAVENOUS

## 2020-08-30 MED ORDER — FENTANYL CITRATE (PF) 100 MCG/2ML IJ SOLN
INTRAMUSCULAR | Status: AC
  Filled 2020-08-30: qty 2

## 2020-08-30 MED ORDER — SODIUM CHLORIDE 0.9 % IV SOLN: INTRAVENOUS | Status: DC

## 2020-08-30 MED ORDER — DEXAMETHASONE SODIUM PHOSPHATE 10 MG/ML IJ SOLN
INTRAMUSCULAR | Status: AC
  Filled 2020-08-30: qty 1

## 2020-08-30 MED ORDER — FENTANYL CITRATE (PF) 100 MCG/2ML IJ SOLN
INTRAMUSCULAR | Status: DC | PRN
  Administered 2020-08-30: 25 ug via INTRAVENOUS

## 2020-08-30 MED ORDER — ONDANSETRON HCL 4 MG/2ML IJ SOLN: 4.0000 mg | Freq: Once | INTRAMUSCULAR | Status: DC | PRN

## 2020-08-30 MED ORDER — SUCCINYLCHOLINE CHLORIDE 20 MG/ML IJ SOLN
INTRAMUSCULAR | Status: DC | PRN
  Administered 2020-08-30: 100 mg via INTRAVENOUS

## 2020-08-30 MED ORDER — DEXMEDETOMIDINE (PRECEDEX) IN NS 20 MCG/5ML (4 MCG/ML) IV SYRINGE
PREFILLED_SYRINGE | INTRAVENOUS | Status: AC
  Filled 2020-08-30: qty 5

## 2020-08-30 MED ORDER — ONDANSETRON HCL 4 MG/2ML IJ SOLN
INTRAMUSCULAR | Status: AC
  Filled 2020-08-30: qty 2

## 2020-08-30 MED ORDER — PROPOFOL 10 MG/ML IV BOLUS
INTRAVENOUS | Status: AC
  Filled 2020-08-30: qty 20

## 2020-08-30 MED ORDER — SUCCINYLCHOLINE CHLORIDE 200 MG/10ML IV SOSY
PREFILLED_SYRINGE | INTRAVENOUS | Status: AC
  Filled 2020-08-30: qty 10

## 2020-08-30 MED ORDER — ONDANSETRON HCL 4 MG/2ML IJ SOLN
INTRAMUSCULAR | Status: DC | PRN
  Administered 2020-08-30: 4 mg via INTRAVENOUS

## 2020-08-30 MED ORDER — DEXMEDETOMIDINE (PRECEDEX) IN NS 20 MCG/5ML (4 MCG/ML) IV SYRINGE
PREFILLED_SYRINGE | INTRAVENOUS | Status: DC | PRN
  Administered 2020-08-30: 12 ug via INTRAVENOUS
  Administered 2020-08-30: 8 ug via INTRAVENOUS

## 2020-08-30 MED ORDER — PROPOFOL 10 MG/ML IV BOLUS
INTRAVENOUS | Status: DC | PRN
  Administered 2020-08-30: 140 mg via INTRAVENOUS

## 2020-08-30 MED ORDER — LIDOCAINE HCL (CARDIAC) PF 100 MG/5ML IV SOSY
PREFILLED_SYRINGE | INTRAVENOUS | Status: DC | PRN
  Administered 2020-08-30: 80 mg via INTRAVENOUS

## 2020-08-30 MED ORDER — FENTANYL CITRATE (PF) 100 MCG/2ML IJ SOLN: 25.0000 ug | INTRAMUSCULAR | Status: DC | PRN

## 2020-08-30 NOTE — ED Notes (Signed)
Patient resting quietly in bed. No s/s of distress noted.

## 2020-08-30 NOTE — ED Notes (Signed)
Patient states he is upset because he is still in the ED & has not been taken for an endoscopy. Patient removed all monitoring & refuses to allow this nurse to recheck his vital signs. Patient stated "Nope! I don't want nothing done! Do not touch me!"

## 2020-08-30 NOTE — H&P (Signed)
Jonathon Bellows, MD [address] [place], [place], [place], [address] [address], [place], [place], [place], [address] Phone: [phone]  Fax: [phone]  Primary Care Physician:  Ranae Plumber, [place]   Pre-Procedure History & Physical: HPI:  Ruben [name] is a 65 y.o. male is here for an endoscopy    Past Medical History:  Diagnosis Date  . Cancer ([place])    lung cancer  . CHF (congestive heart failure) ([place])   . COPD (chronic obstructive pulmonary disease) ([place])   . Coronary artery disease   . Depression   . Food impaction of esophagus   . Food impaction of esophagus   . Food impaction of esophagus   . Hypertension   . Myocardial infarction ([place])   . Stroke [facility])     Past Surgical History:  Procedure Laterality Date  . CARDIAC CATHETERIZATION    . COLONOSCOPY    . ELBOW SURGERY Left   . ESOPHAGOGASTRODUODENOSCOPY N/A 01/31/2019   Procedure: ESOPHAGOGASTRODUODENOSCOPY (EGD);  Surgeon: Toledo, Benay Pike, MD;  Location: [facility] ENDOSCOPY;  Service: Gastroenterology;  Laterality: N/A;  . ESOPHAGOGASTRODUODENOSCOPY (EGD) WITH PROPOFOL N/A 03/30/2019   Procedure: ESOPHAGOGASTRODUODENOSCOPY (EGD) WITH PROPOFOL;  Surgeon: Lin Landsman, MD;  Location[facility] ENDOSCOPY;  Service: Gastroenterology;  Laterality: N/A;  . ESOPHAGOGASTRODUODENOSCOPY (EGD) WITH PROPOFOL N/A 02/22/2020   Procedure: ESOPHAGOGASTRODUODENOSCOPY (EGD) WITH PROPOFOL;  Surgeon: Jonathon Bellows, MD;  Location: [facility] ENDOSCOPY;  Service: Gastroenterology;  Laterality: N/A;  . FLEXIBLE BRONCHOSCOPY Bilateral 08/27/2016   Procedure: FLEXIBLE BRONCHOSCOPY;  Surgeon: Allyne Gee, MD;  Location: [facility] ORS;  Service: Pulmonary;  Laterality: Bilateral;  . WRIST SURGERY Left     Prior to Admission medications   Medication Sig Start Date End Date Taking? Authorizing Provider  LORazepam (ATIVAN) 0.5 MG tablet Take 1 tablet (0.5 mg total) by mouth every 8 (eight) hours as needed for anxiety. 08/25/20 09/24/20   Max Sane, MD    Allergies as of 08/29/2020  . (No Known Allergies)    Family History  Problem Relation Age of Onset  . Heart disease Other   . Hypertension Other   . Diabetes Other   . CAD Mother     Social History   Socioeconomic History  . Marital status: Legally Separated    Spouse name: Not on file  . Number of children: Not on file  . Years of education: Not on file  . Highest education level: Not on file  Occupational History  . Occupation: unemployed  Tobacco Use  . Smoking status: Current Every Day Smoker    Packs/day: 1.00    Years: 43.00    Pack years: 43.00    Types: Cigarettes  . Smokeless tobacco: Never Used  Vaping Use  . Vaping Use: Never used  Substance and Sexual Activity  . Alcohol use: No  . Drug use: Yes    Frequency: 4.0 times per week    Types: Marijuana  . Sexual activity: Yes  Other Topics Concern  . Not on file  Social History Narrative   Independent at baseline.   Social Determinants of Health   Financial Resource Strain: Not on file  Food Insecurity: Not on file  Transportation Needs: Not on file  Physical Activity: Not on file  Stress: Not on file  Social Connections: Not on file  Intimate Partner Violence: Not on file    Review of Systems: See HPI, otherwise negative ROS  Physical Exam: BP (!) 160/86   Pulse (!) 108  Temp (!) 97.2 F (36.2 C) (Temporal)   Resp 18   Ht 6' (1.829 m)   Wt 59 kg   SpO2 98%   BMI 17.64 kg/m  General:   Alert,  pleasant and cooperative in NAD Head:  Normocephalic and atraumatic. Neck:  Supple; no masses or thyromegaly. Lungs:  Clear throughout to auscultation, normal respiratory effort.    Heart:  +S1, +S2, Regular rate and rhythm, No edema. Abdomen:  Soft, nontender and nondistended. Normal bowel sounds, without guarding, and without rebound.   Neurologic:  Alert and  oriented x4;  grossly normal neurologically.  Impression/Plan: Ruben [name] is here for an endoscopy  to be  performed for  evaluation of food impaction    Risks, benefits, limitations, and alternatives regarding endoscopy have been reviewed with the patient.  Questions have been answered.  All parties agreeable.   Jonathon Bellows, MD  08/30/2020, 8:24 AM

## 2020-08-30 NOTE — Anesthesia Preprocedure Evaluation (Signed)
Anesthesia Evaluation  Patient identified by MRN, date of birth, ID band  Airway Mallampati: II       Dental  (+) Poor Dentition   Pulmonary pneumonia, resolved, COPD, Current Smoker and Patient abstained from smoking.,  Lung CA   Pulmonary exam normal breath sounds clear to auscultation       Cardiovascular hypertension, + CAD, + Past MI and +CHF  Normal cardiovascular exam Rhythm:Regular Rate:Normal     Neuro/Psych PSYCHIATRIC DISORDERS Depression CVA    GI/Hepatic Neg liver ROS, PUD, GERD  ,  Endo/Other  negative endocrine ROS  Renal/GU negative Renal ROS     Musculoskeletal   Abdominal   Peds negative pediatric ROS (+)  Hematology negative hematology ROS (+)   Anesthesia Other Findings Past Medical History: No date: Cancer ([place])     Comment:  lung cancer No date: CHF (congestive heart failure) (HCC) No date: COPD (chronic obstructive pulmonary disease) (HCC) No date: Coronary artery disease No date: Depression No date: Food impaction of esophagus No date: Food impaction of esophagus No date: Food impaction of esophagus No date: Hypertension No date: Myocardial infarction ([place]) No date: Stroke [facility])  Reproductive/Obstetrics                             Anesthesia Physical  Anesthesia Plan  ASA: III  Anesthesia Plan: General   Post-op Pain Management:    Induction: Intravenous, Rapid sequence and Cricoid pressure planned  PONV Risk Score and Plan: 1 and Ondansetron  Airway Management Planned: Oral ETT and Video Laryngoscope Planned  Additional Equipment: None  Intra-op Plan:   Post-operative Plan: Extubation in OR and Possible Post-op intubation/ventilation  Informed Consent: I have reviewed the patients History and Physical, chart, labs and discussed the procedure including the risks, benefits and alternatives for the proposed anesthesia with the patient or authorized  representative who has indicated his/her understanding and acceptance.       Plan Discussed with: CRNA, Anesthesiologist and Surgeon  Anesthesia Plan Comments:         Anesthesia Quick Evaluation

## 2020-08-30 NOTE — ED Notes (Signed)
Pt pulled off all leads and is laying in bed comfortably.

## 2020-08-30 NOTE — ED Notes (Signed)
Pt being taken to endo at this time.

## 2020-08-30 NOTE — Anesthesia Procedure Notes (Signed)
Procedure Name: Intubation Date/Time: 08/30/2020 8:26 AM Performed by: Doreen Salvage, CRNA Pre-anesthesia Checklist: Patient identified, Emergency Drugs available, Suction available and Patient being monitored Patient Re-evaluated:Patient Re-evaluated prior to induction Oxygen Delivery Method: Circle system utilized Preoxygenation: Pre-oxygenation with 100% oxygen Induction Type: IV induction, Cricoid Pressure applied and Rapid sequence Ventilation: Mask ventilation without difficulty Laryngoscope Size: Mac and 3 Grade View: Grade II Tube type: Oral Tube size: 7.0 mm Number of attempts: 1 Airway Equipment and Method: Stylet Placement Confirmation: ETT inserted through vocal cords under direct vision,  positive ETCO2 and breath sounds checked- equal and bilateral Secured at: 22 cm Tube secured with: Tape Dental Injury: Teeth and Oropharynx as per pre-operative assessment

## 2020-08-30 NOTE — Consult Note (Signed)
Jonathon Bellows , MD [address] [place], [place], [place], [address] [address], [place], [place], [place], [address] Phone: [phone]  Fax: [phone]  Consultation  Referring Provider:     ER Primary Care Physician:  Ranae Plumber, [place] Primary Gastroenterologist:  None          Reason for Consultation:     Food impaction  Date of Admission:  08/29/2020 Date of Consultation:  08/30/2020         HPI:   Ruben [name] is a 65 y.o. male came into the ER last night unable to swallow after eating a piece of steak at 4.30 pm. I was called early this morning after he had failed glucagon, still finds it hard to swallow. 3rd episode. I have last performed his EGD and he had severe esophagitis .He has no teeth   Past Medical History:  Diagnosis Date  . Cancer ([place])    lung cancer  . CHF (congestive heart failure) ([place])   . COPD (chronic obstructive pulmonary disease) ([place])   . Coronary artery disease   . Depression   . Food impaction of esophagus   . Food impaction of esophagus   . Food impaction of esophagus   . Hypertension   . Myocardial infarction ([place])   . Stroke [facility])     Past Surgical History:  Procedure Laterality Date  . CARDIAC CATHETERIZATION    . COLONOSCOPY    . ELBOW SURGERY Left   . ESOPHAGOGASTRODUODENOSCOPY N/A 01/31/2019   Procedure: ESOPHAGOGASTRODUODENOSCOPY (EGD);  Surgeon: Toledo, Benay Pike, MD;  Location: [facility] ENDOSCOPY;  Service: Gastroenterology;  Laterality: N/A;  . ESOPHAGOGASTRODUODENOSCOPY (EGD) WITH PROPOFOL N/A 03/30/2019   Procedure: ESOPHAGOGASTRODUODENOSCOPY (EGD) WITH PROPOFOL;  Surgeon: Lin Landsman, MD;  Location: [facility] ENDOSCOPY;  Service: Gastroenterology;  Laterality: N/A;  . ESOPHAGOGASTRODUODENOSCOPY (EGD) WITH PROPOFOL N/A 02/22/2020   Procedure: ESOPHAGOGASTRODUODENOSCOPY (EGD) WITH PROPOFOL;  Surgeon: Jonathon Bellows, MD;  Location: [facility] ENDOSCOPY;  Service: Gastroenterology;  Laterality: N/A;  . FLEXIBLE BRONCHOSCOPY  Bilateral 08/27/2016   Procedure: FLEXIBLE BRONCHOSCOPY;  Surgeon: Allyne Gee, MD;  Location: [facility] ORS;  Service: Pulmonary;  Laterality: Bilateral;  . WRIST SURGERY Left     Prior to Admission medications   Medication Sig Start Date End Date Taking? Authorizing Provider  LORazepam (ATIVAN) 0.5 MG tablet Take 1 tablet (0.5 mg total) by mouth every 8 (eight) hours as needed for anxiety. 08/25/20 09/24/20  Max Sane, MD    Family History  Problem Relation Age of Onset  . Heart disease Other   . Hypertension Other   . Diabetes Other   . CAD Mother      Social History   Tobacco Use  . Smoking status: Current Every Day Smoker    Packs/day: 1.00    Years: 43.00    Pack years: 43.00    Types: Cigarettes  . Smokeless tobacco: Never Used  Vaping Use  . Vaping Use: Never used  Substance Use Topics  . Alcohol use: No  . Drug use: Yes    Frequency: 4.0 times per week    Types: Marijuana    Allergies as of 08/29/2020  . (No Known Allergies)    Review of Systems:    All systems reviewed and negative except where noted in HPI.   Physical Exam:  Vital signs in last 24 hours: Temp:  [97.2 F (36.2 C)-98.6 F (37 C)] 97.2 F (36.2 C) (05/21 0815) Pulse Rate:  [66-108] 108 (05/21 0815) Resp:  [  16-24] 18 (05/21 0815) BP: (131-160)/(62-86) 160/86 (05/21 0815) SpO2:  [93 %-100 %] 98 % (05/21 0815) Weight:  [59 kg-61.2 kg] 59 kg (05/21 0815)   General:   Pleasant, cooperative in NAD Head:  Normocephalic and atraumatic.No teeth  Eyes:   No icterus.   Conjunctiva pink. PERRLA. Ears:  Normal auditory acuity. Neck:  Supple; no masses or thyroidomegaly Lungs: Respirations even and unlabored. Lungs clear to auscultation bilaterally.   No wheezes, crackles, or rhonchi.  Heart:  Regular rate and rhythm;  Without murmur, clicks, rubs or gallops Abdomen:  Soft, nondistended, nontender. Normal bowel sounds. No appreciable masses or hepatomegaly.  No rebound or guarding.  Neurologic:   Alert and oriented x3;  grossly normal neurologically. Skin:  Intact without significant lesions or rashes. Cervical Nodes:  No significant cervical adenopathy. Psych:  Alert and cooperative. Normal affect.  LAB RESULTS: Recent Labs    08/29/20 2348  WBC 10.9*  HGB 12.8*  HCT 40.1  PLT 286   BMET Recent Labs    08/29/20 2348  NA 141  K 4.3  CL 103  CO2 29  GLUCOSE 87  BUN 11  CREATININE 0.75  CALCIUM 9.1   LFT No results for input(s): PROT, ALBUMIN, AST, ALT, ALKPHOS, BILITOT, BILIDIR, IBILI in the last 72 hours. PT/INR No results for input(s): LABPROT, INR in the last 72 hours.  STUDIES: No results found.    Impression / Plan:   Ruben [name] is a 65 y.o. y/o male with here with food impaction , 3rd episode. H/o esophagitis, no teeth .  Plan   1. EGD  I have discussed alternative options, risks & benefits,  which include, but are not limited to, bleeding, infection, perforation,respiratory complication & drug reaction.  The patient agrees with this plan & written consent will be obtained.  \   Thank you for involving me in the care of this patient.      LOS: 0 days   Jonathon Bellows, MD  08/30/2020, 8:24 AM

## 2020-08-30 NOTE — Op Note (Signed)
[facility] Gastroenterology Patient Name: Ruben [name] Procedure Date: 08/30/2020 8:12 AM MRN: [id number] Account #: [id number] Date of Birth: August 04, 1955 Admit Type: Outpatient Age: 65 Room: [facility] ENDO ROOM 4 Gender: Male Note Status: Finalized Procedure:             Upper GI endoscopy Indications:           Dysphagia Providers:             Jonathon Bellows MD, MD Medicines:             Monitored Anesthesia Care Complications:         No immediate complications. Procedure:             Pre-Anesthesia Assessment:                        - Prior to the procedure, a History and Physical was                         performed, and patient medications, allergies and                         sensitivities were reviewed. The patient's tolerance                         of previous anesthesia was reviewed.                        - The risks and benefits of the procedure and the                         sedation options and risks were discussed with the                         patient. All questions were answered and informed                         consent was obtained.                        - ASA Grade Assessment: II - A patient with mild                         systemic disease.                        After obtaining informed consent, the endoscope was                         passed under direct vision. Throughout the procedure,                         the patient's blood pressure, pulse, and oxygen                         saturations were monitored continuously. The Endoscope                         was introduced through the mouth, and advanced to the  third part of duodenum. The upper GI endoscopy was                         accomplished without difficulty. The patient tolerated                         the procedure well. Findings:      Food was found in the lower third of the esophagus. Removal was       accomplished with a rothnet and tripod      LA  Grade D (one or more mucosal breaks involving at least 75% of       esophageal circumference) esophagitis with no bleeding was found in the       lower third of the esophagus.      One benign-appearing, intrinsic moderate stenosis was found at the       gastroesophageal junction. This stenosis measured 1.3 cm (inner       diameter). The stenosis was traversed.      The cardia and gastric fundus were normal on retroflexion.      A hiatal hernia was present.      The examined duodenum was normal. Impression:            - Food was found in the esophagus. Removal was                         successful.                        - LA Grade D reflux esophagitis with no bleeding.                        - Benign-appearing esophageal stenosis.                        - Hiatal hernia.                        - Normal examined duodenum.                        - No specimens collected. Recommendation:        - Discharge patient to home (with escort).                        - Resume previous diet.                        - Use Prilosec (omeprazole) 40 mg PO BID for 3 months.                        - Return to my office in 4 weeks.                        - Repeat upper endoscopy in 6 weeks to evaluate the                         response to therapy. Procedure Code(s):     --- Professional ---                        [phone],  Esophagogastroduodenoscopy, flexible,                         transoral; diagnostic, including collection of                         specimen(s) by brushing or washing, when performed                         (separate procedure) Diagnosis Code(s):     --- Professional ---                        O87.867E, Food in esophagus causing other injury,                         initial encounter                        K21.00, Gastro-esophageal reflux disease with                         esophagitis, without bleeding                        K22.2, Esophageal obstruction                         R13.10, Dysphagia, unspecified                        K44.9, Diaphragmatic hernia without obstruction or                         gangrene CPT copyright 2019 American Medical Association. All rights reserved. The codes documented in this report are preliminary and upon coder review may  be revised to meet current compliance requirements. Jonathon Bellows, MD Jonathon Bellows MD, MD 08/30/2020 8:52:22 AM This report has been signed electronically. Number of Addenda: 0 Note Initiated On: 08/30/2020 8:12 AM Estimated Blood Loss:  Estimated blood loss: none.      [facility] [place] [place]

## 2020-08-30 NOTE — ED Notes (Signed)
Reapplied cardiac leads to pt. Pt very upset at this time because he can not have anything to drink. Pt stated, "I have been lied to by that white woman. She told me I was going downstairs. I am not paying for this. I am going to call the news stations and tell then [facility] aint shit." Pt informed that we can not control when endo comes in to get him. MD Ward did inform pt that he would be taken after 7am.

## 2020-08-30 NOTE — ED Notes (Addendum)
Patient resting quietly in bed. Awakened by MD & asked how he feels. Patient states he still feels miserable, no change in status of food bolus. MD informed patient he will consult w/GI this a.m.

## 2020-08-30 NOTE — Transfer of Care (Signed)
Immediate Anesthesia Transfer of Care Note  Patient: Ruben [name]  Procedure(s) Performed: Procedure(s): ESOPHAGOGASTRODUODENOSCOPY (EGD) (N/A)  Patient Location: PACU  Anesthesia Type:General  Level of Consciousness: sedated  Airway & Oxygen Therapy: Patient Spontanous Breathing and Patient connected to face mask oxygen  Post-op Assessment: Report given to RN and Post -op Vital signs reviewed and stable  Post vital signs: Reviewed and stable  Last Vitals:  Vitals:   08/30/20 0815 08/30/20 0904  BP: (!) 160/86 135/73  Pulse: (!) 108 (!) 102  Resp: 18 18  Temp: (!) 36.2 C 36.5 C  SpO2: 87% 21%    Complications: No apparent anesthesia complications

## 2020-09-01 ENCOUNTER — Encounter: Payer: Self-pay | Admitting: Gastroenterology

## 2020-09-02 NOTE — Anesthesia Postprocedure Evaluation (Signed)
Anesthesia Post Note  Patient: Ruben [name]  Procedure(s) Performed: ESOPHAGOGASTRODUODENOSCOPY (EGD) (N/A )  Patient location during evaluation: PACU Anesthesia Type: General Level of consciousness: awake and alert and oriented Pain management: pain level controlled Vital Signs Assessment: post-procedure vital signs reviewed and stable Respiratory status: spontaneous breathing Cardiovascular status: blood pressure returned to baseline Anesthetic complications: no   No complications documented.   Last Vitals:  Vitals:   08/30/20 0948 08/30/20 1003  BP:  131/75  Pulse: 90 93  Resp: 19   Temp:    SpO2: 100% 94%    Last Pain:  Vitals:   08/30/20 1003  TempSrc:   PainSc: 0-No pain                 Shemuel Harkleroad

## 2020-09-10 ENCOUNTER — Telehealth: Payer: Self-pay

## 2020-09-10 NOTE — Telephone Encounter (Signed)
Dr. Vicente Males would like for patient to repeat EGD. Unable to leave message.

## 2020-10-24 ENCOUNTER — Encounter: Payer: Self-pay | Admitting: Ophthalmology

## 2020-11-03 NOTE — Discharge Instructions (Signed)

## 2020-11-05 ENCOUNTER — Ambulatory Visit: Payer: Medicaid Other | Admitting: Anesthesiology

## 2020-11-05 ENCOUNTER — Encounter: Payer: Self-pay | Admitting: Ophthalmology

## 2020-11-05 ENCOUNTER — Other Ambulatory Visit: Payer: Self-pay

## 2020-11-05 ENCOUNTER — Ambulatory Visit
Admission: RE | Admit: 2020-11-05 | Discharge: 2020-11-05 | Disposition: A | Discharge status: Home / Self Care | Payer: Medicaid Other | Source: Ambulatory Visit | Attending: Ophthalmology | Admitting: Ophthalmology

## 2020-11-05 ENCOUNTER — Encounter
Admission: RE | Disposition: A | Discharge status: Home / Self Care | Payer: Self-pay | Source: Ambulatory Visit | Attending: Ophthalmology

## 2020-11-05 DIAGNOSIS — H2512 Age-related nuclear cataract, left eye: Secondary | ICD-10-CM | POA: Diagnosis not present

## 2020-11-05 DIAGNOSIS — I2510 Atherosclerotic heart disease of native coronary artery without angina pectoris: Secondary | ICD-10-CM | POA: Insufficient documentation

## 2020-11-05 DIAGNOSIS — I110 Hypertensive heart disease with heart failure: Secondary | ICD-10-CM | POA: Diagnosis not present

## 2020-11-05 DIAGNOSIS — F419 Anxiety disorder, unspecified: Secondary | ICD-10-CM | POA: Insufficient documentation

## 2020-11-05 DIAGNOSIS — I252 Old myocardial infarction: Secondary | ICD-10-CM | POA: Insufficient documentation

## 2020-11-05 DIAGNOSIS — J449 Chronic obstructive pulmonary disease, unspecified: Secondary | ICD-10-CM | POA: Diagnosis not present

## 2020-11-05 DIAGNOSIS — I509 Heart failure, unspecified: Secondary | ICD-10-CM | POA: Insufficient documentation

## 2020-11-05 DIAGNOSIS — F17210 Nicotine dependence, cigarettes, uncomplicated: Secondary | ICD-10-CM | POA: Insufficient documentation

## 2020-11-05 HISTORY — DX: Sleep apnea, unspecified: G47.30

## 2020-11-05 HISTORY — PX: CATARACT EXTRACTION W/PHACO: SHX586

## 2020-11-05 SURGERY — PHACOEMULSIFICATION, CATARACT, WITH IOL INSERTION
Anesthesia: Monitor Anesthesia Care | Site: Eye | Laterality: Left

## 2020-11-05 MED ORDER — MIDAZOLAM HCL 2 MG/2ML IJ SOLN
INTRAMUSCULAR | Status: DC | PRN
  Administered 2020-11-05 (×2): .5 mg via INTRAVENOUS

## 2020-11-05 MED ORDER — LACTATED RINGERS IV SOLN: INTRAVENOUS | Status: DC

## 2020-11-05 MED ORDER — FENTANYL CITRATE (PF) 100 MCG/2ML IJ SOLN
INTRAMUSCULAR | Status: DC | PRN
  Administered 2020-11-05: 25 ug via INTRAVENOUS

## 2020-11-05 MED ORDER — CYCLOPENTOLATE HCL 2 % OP SOLN
1.0000 [drp] | OPHTHALMIC | Status: DC | PRN
  Administered 2020-11-05 (×3): 1 [drp] via OPHTHALMIC

## 2020-11-05 MED ORDER — SIGHTPATH DOSE#1 NA HYALUR & NA CHOND-NA HYALUR IO KIT
PACK | INTRAOCULAR | Status: DC | PRN
  Administered 2020-11-05: 1 via OPHTHALMIC

## 2020-11-05 MED ORDER — SIGHTPATH DOSE#1 BSS IO SOLN
INTRAOCULAR | Status: DC | PRN
  Administered 2020-11-05: 2 mL

## 2020-11-05 MED ORDER — SIGHTPATH DOSE#1 BSS IO SOLN
INTRAOCULAR | Status: DC | PRN
  Administered 2020-11-05: 15 mL via INTRAOCULAR

## 2020-11-05 MED ORDER — TETRACAINE HCL 0.5 % OP SOLN
1.0000 [drp] | OPHTHALMIC | Status: DC | PRN
  Administered 2020-11-05 (×3): 1 [drp] via OPHTHALMIC

## 2020-11-05 MED ORDER — PHENYLEPHRINE HCL 10 % OP SOLN
1.0000 [drp] | OPHTHALMIC | Status: DC | PRN
  Administered 2020-11-05 (×3): 1 [drp] via OPHTHALMIC

## 2020-11-05 MED ORDER — SIGHTPATH DOSE#1 BSS IO SOLN
INTRAOCULAR | Status: DC | PRN
  Administered 2020-11-05: 64 mL via OPHTHALMIC

## 2020-11-05 MED ORDER — BRIMONIDINE TARTRATE-TIMOLOL 0.2-0.5 % OP SOLN
OPHTHALMIC | Status: DC | PRN
  Administered 2020-11-05: 1 [drp] via OPHTHALMIC

## 2020-11-05 MED ORDER — CEFUROXIME OPHTHALMIC INJECTION 1 MG/0.1 ML
INJECTION | OPHTHALMIC | Status: DC | PRN
  Administered 2020-11-05: 0.1 mL via INTRACAMERAL

## 2020-11-05 SURGICAL SUPPLY — 16 items
CANNULA ANT/CHMB 27GA (MISCELLANEOUS) ×2 IMPLANT
GLOVE SURG ENC TEXT LTX SZ7.5 (GLOVE) ×2 IMPLANT
GLOVE SURG TRIUMPH 8.0 PF LTX (GLOVE) ×2 IMPLANT
GOWN STRL REUS W/ TWL LRG LVL3 (GOWN DISPOSABLE) ×2 IMPLANT
GOWN STRL REUS W/TWL LRG LVL3 (GOWN DISPOSABLE) ×4
LENS IOL DIOP 21.5 (Intraocular Lens) ×2 IMPLANT
LENS IOL TECNIS MONO 21.5 (Intraocular Lens) ×1 IMPLANT
MARKER SKIN DUAL TIP RULER LAB (MISCELLANEOUS) ×2 IMPLANT
NEEDLE CAPSULORHEX 25GA (NEEDLE) ×2 IMPLANT
NEEDLE FILTER BLUNT 18X 1/2SAF (NEEDLE) ×2
NEEDLE FILTER BLUNT 18X1 1/2 (NEEDLE) ×2 IMPLANT
PACK EYE AFTER SURG (MISCELLANEOUS) ×2 IMPLANT
SYR 3ML LL SCALE MARK (SYRINGE) ×4 IMPLANT
SYR TB 1ML LUER SLIP (SYRINGE) ×2 IMPLANT
WATER STERILE IRR 250ML POUR (IV SOLUTION) ×2 IMPLANT
WIPE NON LINTING 3.25X3.25 (MISCELLANEOUS) ×2 IMPLANT

## 2020-11-05 NOTE — Op Note (Signed)
  OPERATIVE NOTE  Ruben [name] [id number] 11/05/2020   PREOPERATIVE DIAGNOSIS:  Nuclear sclerotic cataract left eye. H25.12   POSTOPERATIVE DIAGNOSIS:    Nuclear sclerotic cataract left eye.     PROCEDURE:  Phacoemusification with posterior chamber intraocular lens placement of the left eye  Ultrasound time: Procedure(s): CATARACT EXTRACTION PHACO AND INTRAOCULAR LENS PLACEMENT (IOC) LEFT 14.02 01:29.4 (Left)  LENS:   Implant Name Type Inv. Item Serial No. Manufacturer Lot No. LRB No. Used Action  LENS IOL DIOP 21.5 - [id number] Intraocular Lens LENS IOL DIOP 21.5 [id number] [id number]   Left 1 Implanted      SURGEON:  Wyonia Hough, MD   ANESTHESIA:  Topical with tetracaine drops and 2% Xylocaine jelly, augmented with 1% preservative-free intracameral lidocaine.    COMPLICATIONS:  None.   DESCRIPTION OF PROCEDURE:  The patient was identified in the holding room and transported to the operating room and placed in the supine position under the operating microscope.  The left eye was identified as the operative eye and it was prepped and draped in the usual sterile ophthalmic fashion.   A 1 millimeter clear-corneal paracentesis was made at the 1:30 position.  0.5 ml of preservative-free 1% lidocaine was injected into the anterior chamber.  The anterior chamber was filled with Viscoat viscoelastic.  A 2.4 millimeter keratome was used to make a near-clear corneal incision at the 10:30 position.  .  A curvilinear capsulorrhexis was made with a cystotome and capsulorrhexis forceps.  Balanced salt solution was used to hydrodissect and hydrodelineate the nucleus.   Phacoemulsification was then used in stop and chop fashion to remove the lens nucleus and epinucleus.  The remaining cortex was then removed using the irrigation and aspiration handpiece. Provisc was then placed into the capsular bag to distend it for lens placement.  A lens was then injected into the capsular bag.  The  remaining viscoelastic was aspirated.   Wounds were hydrated with balanced salt solution.  The anterior chamber was inflated to a physiologic pressure with balanced salt solution.  No wound leaks were noted. Cefuroxime 0.1 ml of a 10mg /ml solution was injected into the anterior chamber for a dose of 1 mg of intracameral antibiotic at the completion of the case.   Timolol and Brimonidine drops were applied to the eye.  The patient was taken to the recovery room in stable condition without complications of anesthesia or surgery.  [name] 11/05/2020, 2:09 PM

## 2020-11-05 NOTE — Anesthesia Preprocedure Evaluation (Signed)
Anesthesia Evaluation  Patient identified by MRN, date of birth, ID band Patient awake    Reviewed: Allergy & Precautions, H&P , NPO status , Patient's Chart, lab work & pertinent test results  Airway Mallampati: II  TM Distance: >3 FB Neck ROM: full    Dental no notable dental hx.    Pulmonary sleep apnea , COPD, Current Smoker and Patient abstained from smoking.,   On 2L [place] currently- on 4L at home all day Pulmonary exam normal        Cardiovascular hypertension, On Medications + CAD and + Past MI  Normal cardiovascular exam Rhythm:regular Rate:Normal     Neuro/Psych Anxiety    GI/Hepatic Neg liver ROS, Medicated,  Endo/Other  negative endocrine ROS  Renal/GU negative Renal ROS  negative genitourinary   Musculoskeletal   Abdominal   Peds  Hematology negative hematology ROS (+)   Anesthesia Other Findings   Reproductive/Obstetrics                             Anesthesia Physical Anesthesia Plan  ASA: 3  Anesthesia Plan: MAC   Post-op Pain Management:    Induction:   PONV Risk Score and Plan: 0 and Midazolam  Airway Management Planned:   Additional Equipment:   Intra-op Plan:   Post-operative Plan:   Informed Consent: I have reviewed the patients History and Physical, chart, labs and discussed the procedure including the risks, benefits and alternatives for the proposed anesthesia with the patient or authorized representative who has indicated his/her understanding and acceptance.       Plan Discussed with:   Anesthesia Plan Comments:         Anesthesia Quick Evaluation

## 2020-11-05 NOTE — Transfer of Care (Signed)
Immediate Anesthesia Transfer of Care Note  Patient: Ruben [name]  Procedure(s) Performed: CATARACT EXTRACTION PHACO AND INTRAOCULAR LENS PLACEMENT (IOC) LEFT 14.02 01:29.4 (Left: Eye)  Patient Location: PACU  Anesthesia Type: MAC  Level of Consciousness: awake, alert  and patient cooperative  Airway and Oxygen Therapy: Patient Spontanous Breathing and Patient connected to supplemental oxygen  Post-op Assessment: Post-op Vital signs reviewed, Patient's Cardiovascular Status Stable, Respiratory Function Stable, Patent Airway and No signs of Nausea or vomiting  Post-op Vital Signs: Reviewed and stable  Complications: No notable events documented.

## 2020-11-05 NOTE — Anesthesia Postprocedure Evaluation (Signed)
Anesthesia Post Note  Patient: Ruben [name]  Procedure(s) Performed: CATARACT EXTRACTION PHACO AND INTRAOCULAR LENS PLACEMENT (IOC) LEFT 14.02 01:29.4 (Left: Eye)     Patient location during evaluation: PACU Anesthesia Type: MAC Level of consciousness: awake and alert Pain management: pain level controlled Vital Signs Assessment: post-procedure vital signs reviewed and stable Respiratory status: spontaneous breathing Cardiovascular status: stable Anesthetic complications: no   No notable events documented.  Gillian Scarce

## 2020-11-05 NOTE — H&P (Signed)
[facility]   Primary Care Physician:  Christianjames, [place], [place] Ophthalmologist: Dr. Leandrew Koyanagi  Pre-Procedure History & Physical: HPI:  Ruben [name] is a 65 y.o. male here for ophthalmic surgery.   Past Medical History:  Diagnosis Date   Cancer ([place])    lung cancer   CHF (congestive heart failure) (HCC)    COPD (chronic obstructive pulmonary disease) (HCC)    uses 4 liters all day   Coronary artery disease    Depression    Food impaction of esophagus    Food impaction of esophagus    Food impaction of esophagus    Hypertension    Myocardial infarction [facility])    Sleep apnea    Stroke [facility])     Past Surgical History:  Procedure Laterality Date   CARDIAC CATHETERIZATION     COLONOSCOPY     ELBOW SURGERY Left    ESOPHAGOGASTRODUODENOSCOPY N/A 01/31/2019   Procedure: ESOPHAGOGASTRODUODENOSCOPY (EGD);  Surgeon: Toledo, Benay Pike, MD;  Location: [facility] ENDOSCOPY;  Service: Gastroenterology;  Laterality: N/A;   ESOPHAGOGASTRODUODENOSCOPY N/A 08/30/2020   Procedure: ESOPHAGOGASTRODUODENOSCOPY (EGD);  Surgeon: Jonathon Bellows, MD;  Location: [facility] ENDOSCOPY;  Service: Gastroenterology;  Laterality: N/A;   ESOPHAGOGASTRODUODENOSCOPY (EGD) WITH PROPOFOL N/A 03/30/2019   Procedure: ESOPHAGOGASTRODUODENOSCOPY (EGD) WITH PROPOFOL;  Surgeon: Lin Landsman, MD;  Location: [facility] ENDOSCOPY;  Service: Gastroenterology;  Laterality: N/A;   ESOPHAGOGASTRODUODENOSCOPY (EGD) WITH PROPOFOL N/A 02/22/2020   Procedure: ESOPHAGOGASTRODUODENOSCOPY (EGD) WITH PROPOFOL;  Surgeon: Jonathon Bellows, MD;  Location: [facility] ENDOSCOPY;  Service: Gastroenterology;  Laterality: N/A;   FLEXIBLE BRONCHOSCOPY Bilateral 08/27/2016   Procedure: FLEXIBLE BRONCHOSCOPY;  Surgeon: Allyne Gee, MD;  Location: [facility] ORS;  Service: Pulmonary;  Laterality: Bilateral;   WRIST SURGERY Left     Prior to Admission medications   Medication Sig Start Date End Date Taking? Authorizing Provider  albuterol (VENTOLIN HFA)  108 (90 Base) MCG/ACT inhaler Inhale into the lungs every 6 (six) hours as needed for wheezing or shortness of breath.   Yes [provider]  Albuterol Sulfate 2.5 MG/0.5ML NEBU Take 1 ampule by nebulization every 6 (six) hours as needed for wheezing.   Yes [provider]  atorvastatin (LIPITOR) 10 MG tablet Take 10 mg by mouth daily.   Yes [provider]  diazepam (VALIUM) 10 MG tablet Take 10 mg by mouth in the morning, at noon, and at bedtime.   Yes [provider]  digoxin (LANOXIN) 0.125 MG tablet Take 250 mcg by mouth daily.   Yes [provider]  diltiazem (DILACOR XR) 240 MG 24 hr capsule Take 300 mg by mouth daily.   Yes [provider]  mometasone-formoterol (DULERA) 200-5 MCG/ACT AERO Inhale 2 puffs into the lungs 2 (two) times daily.   Yes [provider]  omeprazole (PRILOSEC) 40 MG capsule Take 40 mg by mouth daily.   Yes [provider]  sertraline (ZOLOFT) 100 MG tablet Take 100 mg by mouth daily.   Yes [provider]  traMADol (ULTRAM) 50 MG tablet Take 50 mg by mouth every 6 (six) hours as needed.   Yes [provider]  traZODone (DESYREL) 50 MG tablet Take 50 mg by mouth at bedtime.   Yes [provider]    Allergies as of 10/22/2020   (No Known Allergies)    Family History  Problem Relation Age of Onset   Heart disease Other    Hypertension Other    Diabetes Other    CAD Mother  Social History   Socioeconomic History   Marital status: Legally Separated    Spouse name: Not on file   Number of children: Not on file   Years of education: Not on file   Highest education level: Not on file  Occupational History   Occupation: unemployed  Tobacco Use   Smoking status: Every Day    Packs/day: 1.00    Years: 43.00    Pack years: 43.00    Types: Cigarettes   Smokeless tobacco: Never  Vaping Use   Vaping Use: Never used  Substance and Sexual Activity   Alcohol  use: No   Drug use: Yes    Frequency: 4.0 times per week    Types: Marijuana   Sexual activity: Yes  Other Topics Concern   Not on file  Social History Narrative   Independent at baseline.   Social Determinants of Health   Financial Resource Strain: Not on file  Food Insecurity: Not on file  Transportation Needs: Not on file  Physical Activity: Not on file  Stress: Not on file  Social Connections: Not on file  Intimate Partner Violence: Not on file    Review of Systems: See HPI, otherwise negative ROS  Physical Exam: Ht _0  (1.803 m)   Wt 62.1 kg   BMI 19.11 kg/m  General:   Alert,  pleasant and cooperative in NAD Head:  Normocephalic and atraumatic. Lungs:  Clear to auscultation.    Heart:  Regular rate and rhythm.   Impression/Plan: Ruben [name] is here for ophthalmic surgery.  Risks, benefits, limitations, and alternatives regarding ophthalmic surgery have been reviewed with the patient.  Questions have been answered.  All parties agreeable.   Leandrew Koyanagi, MD  11/05/2020, 12:58 PM

## 2020-11-05 NOTE — Anesthesia Procedure Notes (Signed)
Procedure Name: MAC Date/Time: 11/05/2020 1:50 PM Performed by: Dionne Bucy, CRNA Pre-anesthesia Checklist: Patient identified, Emergency Drugs available, Suction available, Patient being monitored and Timeout performed Patient Re-evaluated:Patient Re-evaluated prior to induction Oxygen Delivery Method: Nasal cannula Placement Confirmation: positive ETCO2

## 2020-11-06 ENCOUNTER — Encounter: Payer: Self-pay | Admitting: Ophthalmology

## 2020-11-13 ENCOUNTER — Other Ambulatory Visit: Payer: Self-pay

## 2020-11-13 ENCOUNTER — Emergency Department: Payer: Medicare Other

## 2020-11-13 ENCOUNTER — Emergency Department
Admission: EM | Admit: 2020-11-13 | Discharge: 2020-11-14 | Payer: Medicare Other | Attending: Emergency Medicine | Admitting: Emergency Medicine

## 2020-11-13 DIAGNOSIS — T300 Burn of unspecified body region, unspecified degree: Secondary | ICD-10-CM

## 2020-11-13 DIAGNOSIS — Z85118 Personal history of other malignant neoplasm of bronchus and lung: Secondary | ICD-10-CM | POA: Diagnosis not present

## 2020-11-13 DIAGNOSIS — J705 Respiratory conditions due to smoke inhalation: Secondary | ICD-10-CM | POA: Diagnosis not present

## 2020-11-13 DIAGNOSIS — T24032A Burn of unspecified degree of left lower leg, initial encounter: Secondary | ICD-10-CM | POA: Insufficient documentation

## 2020-11-13 DIAGNOSIS — I2510 Atherosclerotic heart disease of native coronary artery without angina pectoris: Secondary | ICD-10-CM | POA: Diagnosis not present

## 2020-11-13 DIAGNOSIS — I110 Hypertensive heart disease with heart failure: Secondary | ICD-10-CM | POA: Diagnosis not present

## 2020-11-13 DIAGNOSIS — X088XXA Exposure to other specified smoke, fire and flames, initial encounter: Secondary | ICD-10-CM | POA: Insufficient documentation

## 2020-11-13 DIAGNOSIS — T22332A Burn of third degree of left upper arm, initial encounter: Secondary | ICD-10-CM | POA: Insufficient documentation

## 2020-11-13 DIAGNOSIS — T59811A Toxic effect of smoke, accidental (unintentional), initial encounter: Secondary | ICD-10-CM | POA: Diagnosis not present

## 2020-11-13 DIAGNOSIS — T2101XA Burn of unspecified degree of chest wall, initial encounter: Secondary | ICD-10-CM | POA: Diagnosis not present

## 2020-11-13 DIAGNOSIS — T2009XA Burn of unspecified degree of multiple sites of head, face, and neck, initial encounter: Secondary | ICD-10-CM | POA: Insufficient documentation

## 2020-11-13 DIAGNOSIS — T3111 Burns involving 10-19% of body surface with 10-19% third degree burns: Secondary | ICD-10-CM | POA: Insufficient documentation

## 2020-11-13 DIAGNOSIS — I5032 Chronic diastolic (congestive) heart failure: Secondary | ICD-10-CM | POA: Insufficient documentation

## 2020-11-13 DIAGNOSIS — T2139XA Burn of third degree of other site of trunk, initial encounter: Secondary | ICD-10-CM | POA: Diagnosis not present

## 2020-11-13 DIAGNOSIS — Z20822 Contact with and (suspected) exposure to covid-19: Secondary | ICD-10-CM | POA: Diagnosis not present

## 2020-11-13 DIAGNOSIS — F17210 Nicotine dependence, cigarettes, uncomplicated: Secondary | ICD-10-CM | POA: Diagnosis not present

## 2020-11-13 LAB — CBC WITH DIFFERENTIAL/PLATELET
Abs Immature Granulocytes: 0.07 10*3/uL (ref 0.00–0.07)
Basophils Absolute: 0.1 10*3/uL (ref 0.0–0.1)
Basophils Relative: 1 %
Eosinophils Absolute: 0.2 10*3/uL (ref 0.0–0.5)
Eosinophils Relative: 1 %
HCT: 42.5 % (ref 39.0–52.0)
Hemoglobin: 14.5 g/dL (ref 13.0–17.0)
Immature Granulocytes: 1 %
Lymphocytes Relative: 17 %
Lymphs Abs: 2.4 10*3/uL (ref 0.7–4.0)
MCH: 29 pg (ref 26.0–34.0)
MCHC: 34.1 g/dL (ref 30.0–36.0)
MCV: 85 fL (ref 80.0–100.0)
Monocytes Absolute: 1.3 10*3/uL — ABNORMAL HIGH (ref 0.1–1.0)
Monocytes Relative: 9 %
Neutro Abs: 9.8 10*3/uL — ABNORMAL HIGH (ref 1.7–7.7)
Neutrophils Relative %: 71 %
Platelets: 543 10*3/uL — ABNORMAL HIGH (ref 150–400)
RBC: 5 MIL/uL (ref 4.22–5.81)
RDW: 14.2 % (ref 11.5–15.5)
WBC: 13.8 10*3/uL — ABNORMAL HIGH (ref 4.0–10.5)
nRBC: 0 % (ref 0.0–0.2)

## 2020-11-13 MED ORDER — LACTATED RINGERS IV SOLN: INTRAVENOUS | Status: DC

## 2020-11-13 MED ORDER — PROPOFOL 10 MG/ML IV BOLUS
INTRAVENOUS | Status: AC
  Administered 2020-11-13: 75 mg
  Filled 2020-11-13: qty 20

## 2020-11-13 MED ORDER — ETOMIDATE 2 MG/ML IV SOLN
INTRAVENOUS | Status: AC | PRN
  Administered 2020-11-13: 20 mg via INTRAVENOUS

## 2020-11-13 MED ORDER — PROPOFOL 1000 MG/100ML IV EMUL
INTRAVENOUS | Status: AC
  Administered 2020-11-13: 50 mg
  Filled 2020-11-13: qty 100

## 2020-11-13 MED ORDER — SUCCINYLCHOLINE CHLORIDE 20 MG/ML IJ SOLN
INTRAMUSCULAR | Status: AC | PRN
  Administered 2020-11-13: 200 mg via INTRAVENOUS

## 2020-11-13 NOTE — ED Notes (Signed)
X-ray at bedside

## 2020-11-13 NOTE — ED Notes (Signed)
ED Provider at bedside. 
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
## 2020-11-13 NOTE — ED Notes (Signed)
Pt unable to sign transfer consent at this time d/t intubation and emergency transfer.
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
## 2020-11-14 ENCOUNTER — Emergency Department: Payer: Medicare Other

## 2020-11-14 DIAGNOSIS — T2009XA Burn of unspecified degree of multiple sites of head, face, and neck, initial encounter: Secondary | ICD-10-CM | POA: Diagnosis not present

## 2020-11-14 DIAGNOSIS — I5032 Chronic diastolic (congestive) heart failure: Secondary | ICD-10-CM | POA: Diagnosis not present

## 2020-11-14 DIAGNOSIS — I2510 Atherosclerotic heart disease of native coronary artery without angina pectoris: Secondary | ICD-10-CM | POA: Diagnosis not present

## 2020-11-14 DIAGNOSIS — Z85118 Personal history of other malignant neoplasm of bronchus and lung: Secondary | ICD-10-CM | POA: Diagnosis not present

## 2020-11-14 DIAGNOSIS — T59811A Toxic effect of smoke, accidental (unintentional), initial encounter: Secondary | ICD-10-CM | POA: Diagnosis not present

## 2020-11-14 DIAGNOSIS — F17210 Nicotine dependence, cigarettes, uncomplicated: Secondary | ICD-10-CM | POA: Diagnosis not present

## 2020-11-14 DIAGNOSIS — T2101XA Burn of unspecified degree of chest wall, initial encounter: Secondary | ICD-10-CM | POA: Diagnosis not present

## 2020-11-14 DIAGNOSIS — J705 Respiratory conditions due to smoke inhalation: Secondary | ICD-10-CM | POA: Diagnosis not present

## 2020-11-14 DIAGNOSIS — T24032A Burn of unspecified degree of left lower leg, initial encounter: Secondary | ICD-10-CM | POA: Diagnosis not present

## 2020-11-14 DIAGNOSIS — T22332A Burn of third degree of left upper arm, initial encounter: Secondary | ICD-10-CM | POA: Diagnosis present

## 2020-11-14 DIAGNOSIS — X088XXA Exposure to other specified smoke, fire and flames, initial encounter: Secondary | ICD-10-CM | POA: Diagnosis not present

## 2020-11-14 DIAGNOSIS — T3111 Burns involving 10-19% of body surface with 10-19% third degree burns: Secondary | ICD-10-CM | POA: Diagnosis not present

## 2020-11-14 DIAGNOSIS — T2139XA Burn of third degree of other site of trunk, initial encounter: Secondary | ICD-10-CM | POA: Diagnosis not present

## 2020-11-14 DIAGNOSIS — I110 Hypertensive heart disease with heart failure: Secondary | ICD-10-CM | POA: Diagnosis not present

## 2020-11-14 DIAGNOSIS — Z20822 Contact with and (suspected) exposure to covid-19: Secondary | ICD-10-CM | POA: Diagnosis not present

## 2020-11-14 LAB — COOXEMETRY PANEL
Carboxyhemoglobin: 7.6 % (ref 0.5–1.5)
Methemoglobin: 1 % (ref 0.0–1.5)
O2 Saturation: 100 %
Total oxygen content: 91.4 mL/dL

## 2020-11-14 LAB — RESP PANEL BY RT-PCR (FLU A&B, COVID) ARPGX2
Influenza A by PCR: NEGATIVE
Influenza B by PCR: NEGATIVE
SARS Coronavirus 2 by RT PCR: NEGATIVE

## 2020-11-14 NOTE — ED Notes (Signed)
RT at bedside.

## 2020-11-14 NOTE — ED Notes (Signed)
EMTALA reviewed by this RN.  Transfer consent unable to be signed d/t intubated.

## 2020-11-14 NOTE — ED Notes (Signed)
XR at bedside

## 2020-11-19 ENCOUNTER — Ambulatory Visit: Admit: 2020-11-19 | Payer: Medicare Other | Admitting: Ophthalmology

## 2020-11-19 SURGERY — PHACOEMULSIFICATION, CATARACT, WITH IOL INSERTION
Anesthesia: Topical | Laterality: Right

## 2020-11-19 MED FILL — Midazolam HCl Inj 5 MG/5ML (Base Equivalent): INTRAMUSCULAR | Qty: 5 | Status: AC

## 2020-11-19 MED FILL — Ketamine HCl Inj 10 MG/ML: INTRAMUSCULAR | Qty: 20 | Status: AC

## 2020-11-19 MED FILL — Fentanyl Citrate Preservative Free (PF) Inj 100 MCG/2ML: INTRAMUSCULAR | Qty: 2 | Status: AC

## 2020-12-11 DEATH — deceased
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
## 2020-12-20 IMAGING — DX DG CHEST 1V PORT
2 series · 2 of 2 positions shown · non-contrast
Comparison: Same day.

CLINICAL DATA: Dyspnea.

EXAM:
PORTABLE CHEST 1 VIEW

[chest ap (1 of 2)]
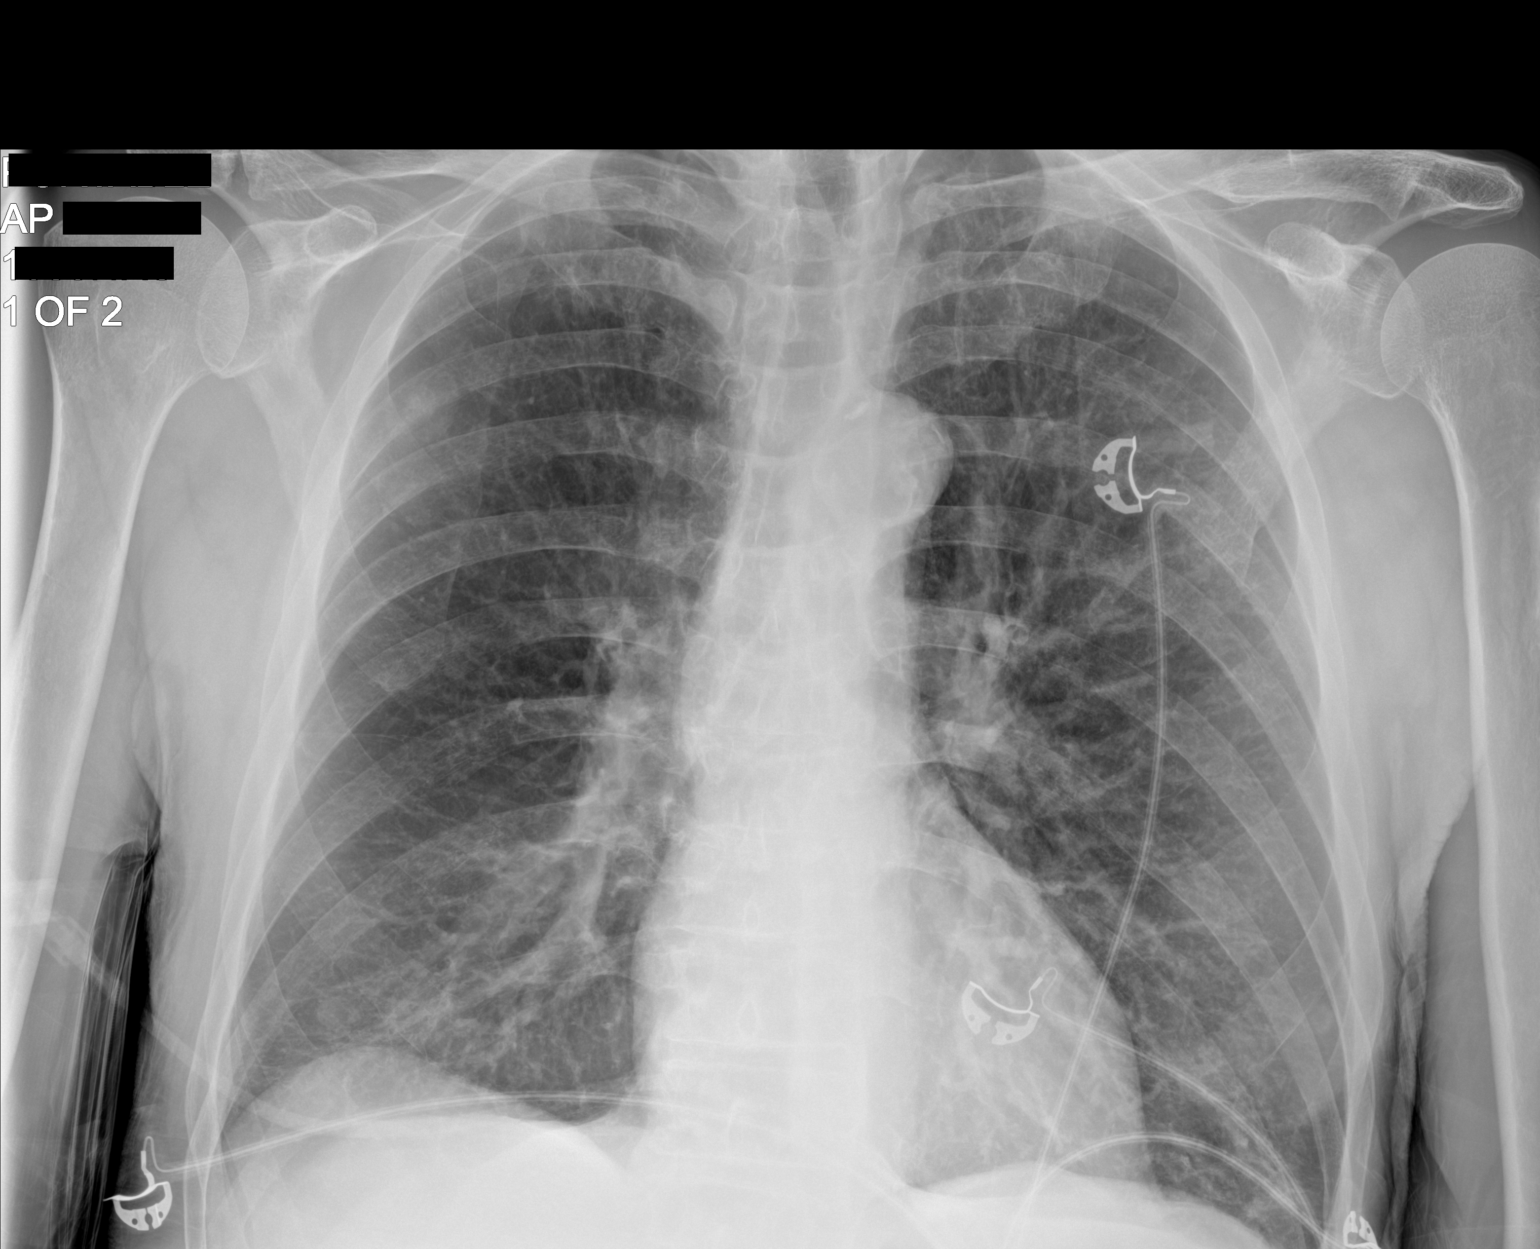

[chest ap (2 of 2)]
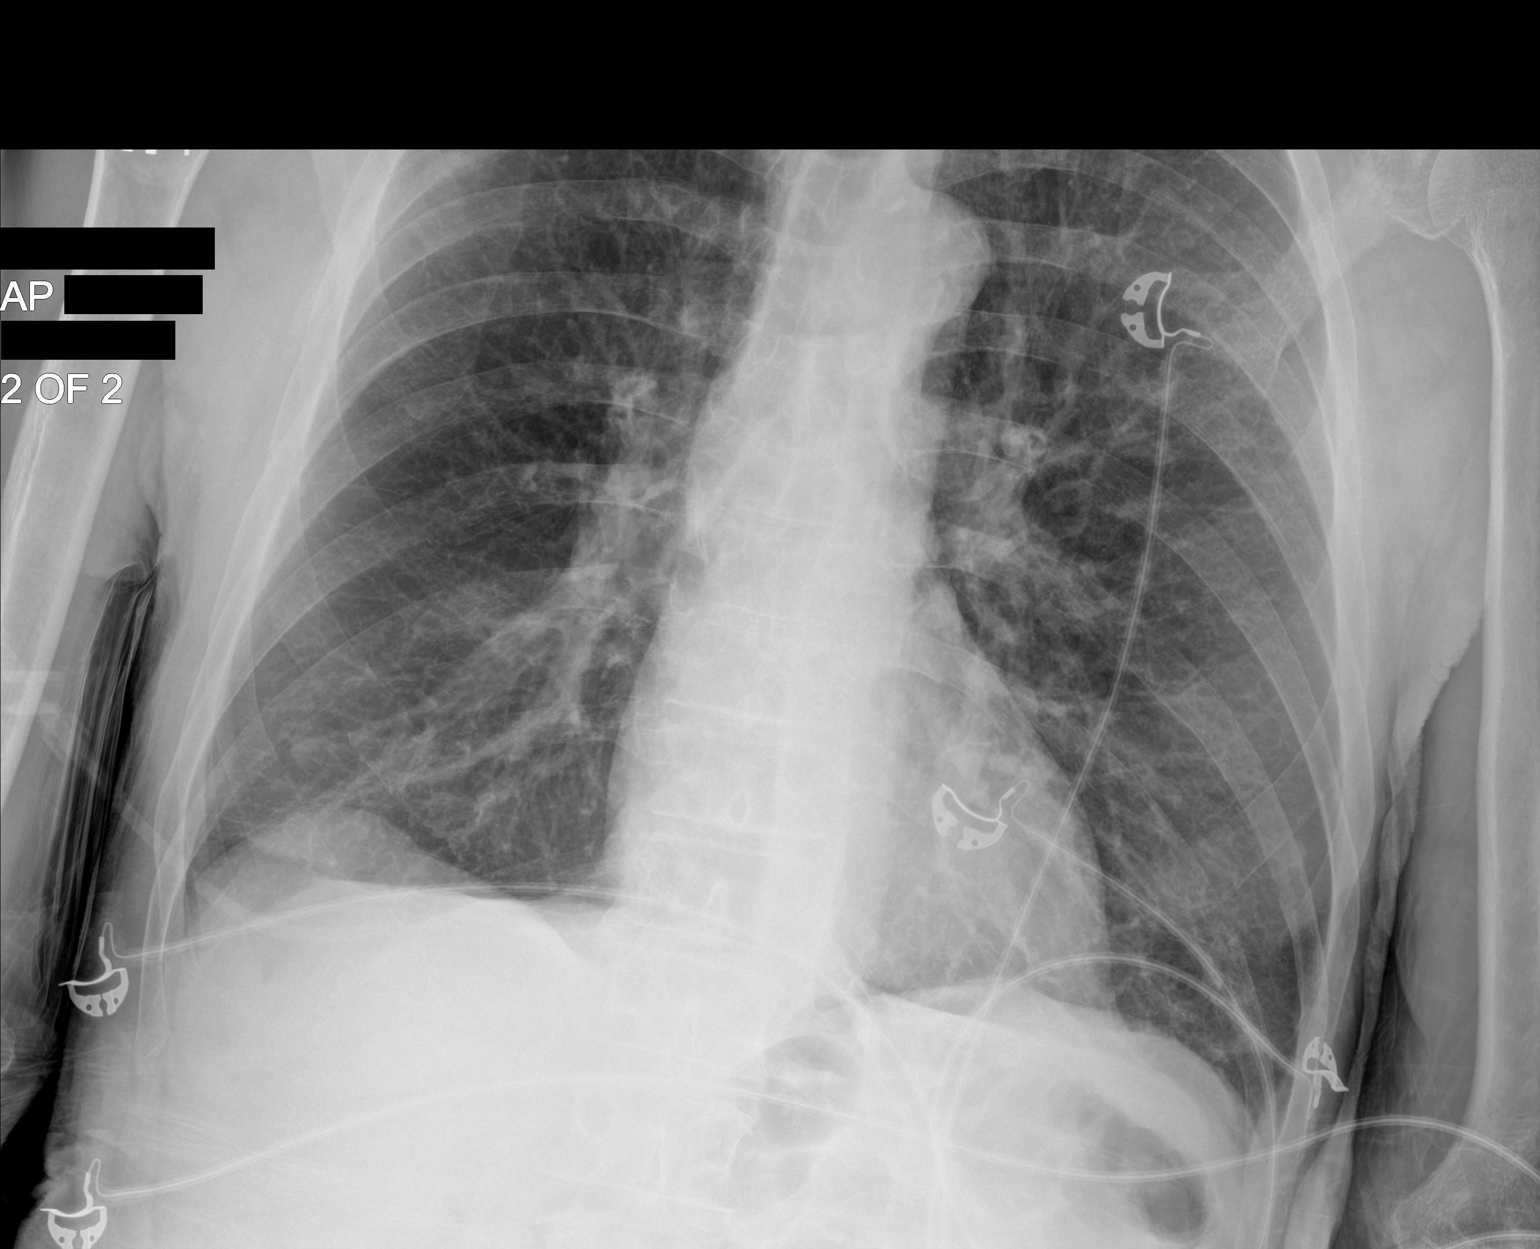

[2 of 2 positions shown; findings below may reference images not displayed]

FINDINGS: The heart size and mediastinal contours are within normal limits.
Atherosclerosis of thoracic aorta is noted. No pneumothorax or
pleural effusion is noted. Stable right middle lobe opacity is noted
which may represent atelectasis or possibly infiltrate. Minimal left
basilar subsegmental atelectasis is noted. The visualized skeletal
structures are unremarkable.
IMPRESSION: Aortic atherosclerosis. Stable right middle lobe opacity is noted
which may represent atelectasis or possibly infiltrate.
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
## 2021-01-19 LAB — BLOOD GAS, ARTERIAL
Acid-Base Excess: 5.1 mmol/L — ABNORMAL HIGH (ref 0.0–2.0)
Bicarbonate: 31.8 mmol/L — ABNORMAL HIGH (ref 20.0–28.0)
FIO2: 100
O2 Saturation: 100 %
Patient temperature: 37
pCO2 arterial: 55 mmHg — ABNORMAL HIGH (ref 32.0–48.0)
pH, Arterial: 7.37 (ref 7.350–7.450)
pO2, Arterial: 489 mmHg — ABNORMAL HIGH (ref 83.0–108.0)
